# Patient Record
Sex: Male | Born: 1975
Health system: Southern US, Community
[De-identification: ages and names within clinical notes are randomized; demographics above are authoritative.]

## PROBLEM LIST (undated history)

## (undated) DIAGNOSIS — M25561 Pain in right knee: Secondary | ICD-10-CM

## (undated) DIAGNOSIS — E785 Hyperlipidemia, unspecified: Secondary | ICD-10-CM

## (undated) DIAGNOSIS — K5900 Constipation, unspecified: Secondary | ICD-10-CM

## (undated) DIAGNOSIS — K219 Gastro-esophageal reflux disease without esophagitis: Secondary | ICD-10-CM

## (undated) DIAGNOSIS — M1990 Unspecified osteoarthritis, unspecified site: Secondary | ICD-10-CM

## (undated) DIAGNOSIS — M722 Plantar fascial fibromatosis: Secondary | ICD-10-CM

## (undated) DIAGNOSIS — M5430 Sciatica, unspecified side: Secondary | ICD-10-CM

## (undated) DIAGNOSIS — M2550 Pain in unspecified joint: Secondary | ICD-10-CM

## (undated) DIAGNOSIS — G8929 Other chronic pain: Secondary | ICD-10-CM

## (undated) DIAGNOSIS — J42 Unspecified chronic bronchitis: Secondary | ICD-10-CM

## (undated) DIAGNOSIS — G039 Meningitis, unspecified: Secondary | ICD-10-CM

## (undated) DIAGNOSIS — M545 Low back pain, unspecified: Secondary | ICD-10-CM

## (undated) DIAGNOSIS — I209 Angina pectoris, unspecified: Secondary | ICD-10-CM

## (undated) DIAGNOSIS — G5600 Carpal tunnel syndrome, unspecified upper limb: Secondary | ICD-10-CM

## (undated) DIAGNOSIS — K8020 Calculus of gallbladder without cholecystitis without obstruction: Secondary | ICD-10-CM

## (undated) HISTORY — DX: Hyperlipidemia, unspecified: E78.5

## (undated) HISTORY — DX: Constipation, unspecified: K59.00

## (undated) HISTORY — DX: Calculus of gallbladder without cholecystitis without obstruction: K80.20

## (undated) HISTORY — DX: Plantar fascial fibromatosis: M72.2

## (undated) HISTORY — DX: Sciatica, unspecified side: M54.30

## (undated) HISTORY — PX: SPINE SURGERY: SHX786

## (undated) HISTORY — PX: KNEE ARTHROSCOPY: SHX127

## (undated) HISTORY — PX: LAMINECTOMY AND MICRODISCECTOMY SPINE: SHX1914

## (undated) HISTORY — DX: Pain in unspecified joint: M25.50

## (undated) HISTORY — DX: Gastro-esophageal reflux disease without esophagitis: K21.9

## (undated) HISTORY — DX: Carpal tunnel syndrome, unspecified upper limb: G56.00

## (undated) HISTORY — DX: Pain in right knee: M25.561

## (undated) HISTORY — PX: CARPAL TUNNEL RELEASE: SHX101

---

## 2001-06-11 ENCOUNTER — Emergency Department (HOSPITAL_COMMUNITY): Admission: EM | Admit: 2001-06-11 | Discharge: 2001-06-11 | Payer: Self-pay | Admitting: Emergency Medicine

## 2003-10-19 ENCOUNTER — Emergency Department (HOSPITAL_COMMUNITY): Admission: EM | Admit: 2003-10-19 | Discharge: 2003-10-19 | Payer: Self-pay | Admitting: *Deleted

## 2004-04-01 ENCOUNTER — Encounter: Admission: RE | Admit: 2004-04-01 | Discharge: 2004-04-01 | Payer: Self-pay | Admitting: Family Medicine

## 2004-05-05 ENCOUNTER — Encounter: Admission: RE | Admit: 2004-05-05 | Discharge: 2004-05-05 | Payer: Self-pay | Admitting: Gastroenterology

## 2004-05-12 ENCOUNTER — Emergency Department (HOSPITAL_COMMUNITY): Admission: EM | Admit: 2004-05-12 | Discharge: 2004-05-12 | Payer: Self-pay | Admitting: Family Medicine

## 2004-05-14 ENCOUNTER — Emergency Department (HOSPITAL_COMMUNITY): Admission: EM | Admit: 2004-05-14 | Discharge: 2004-05-14 | Payer: Self-pay | Admitting: Family Medicine

## 2009-05-31 HISTORY — PX: CHOLECYSTECTOMY: SHX55

## 2010-05-24 ENCOUNTER — Emergency Department (HOSPITAL_COMMUNITY)
Admission: EM | Admit: 2010-05-24 | Discharge: 2010-05-24 | Payer: Self-pay | Source: Home / Self Care | Admitting: Emergency Medicine

## 2010-05-31 DEATH — deceased

## 2010-06-21 ENCOUNTER — Encounter: Payer: Self-pay | Admitting: Family Medicine

## 2011-06-03 ENCOUNTER — Ambulatory Visit: Payer: Self-pay | Admitting: Rheumatology

## 2011-06-18 ENCOUNTER — Ambulatory Visit: Payer: Self-pay | Admitting: Rheumatology

## 2011-07-14 ENCOUNTER — Ambulatory Visit: Payer: Self-pay | Admitting: Anesthesiology

## 2011-07-14 LAB — CBC WITH DIFFERENTIAL/PLATELET
Basophil #: 0 10*3/uL (ref 0.0–0.1)
Basophil %: 0.4 %
Eosinophil #: 0.1 10*3/uL (ref 0.0–0.7)
Eosinophil %: 2.4 %
HCT: 41.4 % (ref 40.0–52.0)
HGB: 14.3 g/dL (ref 13.0–18.0)
Lymphocyte #: 1.8 10*3/uL (ref 1.0–3.6)
Lymphocyte %: 32.6 %
MCH: 31 pg (ref 26.0–34.0)
MCHC: 34.4 g/dL (ref 32.0–36.0)
MCV: 90 fL (ref 80–100)
Monocyte #: 0.3 10*3/uL (ref 0.0–0.7)
Monocyte %: 5.7 %
Neutrophil #: 3.2 10*3/uL (ref 1.4–6.5)
Neutrophil %: 58.9 %
Platelet: 130 10*3/uL — ABNORMAL LOW (ref 150–440)
RBC: 4.6 10*6/uL (ref 4.40–5.90)
RDW: 12.8 % (ref 11.5–14.5)
WBC: 5.4 10*3/uL (ref 3.8–10.6)

## 2011-07-16 ENCOUNTER — Ambulatory Visit: Payer: Self-pay | Admitting: Rheumatology

## 2011-07-20 ENCOUNTER — Ambulatory Visit: Payer: Self-pay | Admitting: Orthopedic Surgery

## 2011-12-16 ENCOUNTER — Other Ambulatory Visit: Payer: Self-pay | Admitting: Internal Medicine

## 2011-12-16 ENCOUNTER — Telehealth: Payer: Self-pay

## 2011-12-16 MED ORDER — VARENICLINE TARTRATE 1 MG PO TABS: 1.0000 mg | ORAL_TABLET | Freq: Two times a day (BID) | ORAL | Status: AC

## 2011-12-16 NOTE — Telephone Encounter (Signed)
Pt is requesting  A new script for chantix

## 2011-12-16 NOTE — Telephone Encounter (Signed)
Chart is at nurses station for review 

## 2011-12-16 NOTE — Telephone Encounter (Signed)
PATIENT CALLED BACK SAYING HE FORGOT TO TELL us ABOUT HIS NEW PHARMACY.  HE WILL BE USING THE [facility] IN [place] ON [address]

## 2011-12-16 NOTE — Telephone Encounter (Signed)
Refilled x 1 month but last OV in August 2012 so needs recheck for additional refills

## 2011-12-17 NOTE — Telephone Encounter (Signed)
Pt.notified

## 2012-03-09 ENCOUNTER — Telehealth: Payer: Self-pay

## 2012-03-09 NOTE — Telephone Encounter (Signed)
Advised patient as per last phone message, he is due for follow up. He is going to call back and make appt to see Dr Perrin Maltese.

## 2012-03-09 NOTE — Telephone Encounter (Signed)
The patient called to request refill of Chantix rx.  The patient would like the rx called into the [facility] on [address].  Please call the patient once the rx has been called in to the pharmacy at [phone].

## 2012-06-11 ENCOUNTER — Ambulatory Visit: Payer: 59 | Admitting: Family Medicine

## 2012-06-11 VITALS — BP 117/77 | HR 90 | Temp 98.2°F | Resp 16 | Ht 69.0 in | Wt 231.0 lb

## 2012-06-11 DIAGNOSIS — J329 Chronic sinusitis, unspecified: Secondary | ICD-10-CM

## 2012-06-11 DIAGNOSIS — F17200 Nicotine dependence, unspecified, uncomplicated: Secondary | ICD-10-CM

## 2012-06-11 DIAGNOSIS — J019 Acute sinusitis, unspecified: Secondary | ICD-10-CM

## 2012-06-11 DIAGNOSIS — Z23 Encounter for immunization: Secondary | ICD-10-CM

## 2012-06-11 DIAGNOSIS — IMO0002 Reserved for concepts with insufficient information to code with codable children: Secondary | ICD-10-CM

## 2012-06-11 DIAGNOSIS — Z202 Contact with and (suspected) exposure to infections with a predominantly sexual mode of transmission: Secondary | ICD-10-CM

## 2012-06-11 DIAGNOSIS — S61209A Unspecified open wound of unspecified finger without damage to nail, initial encounter: Secondary | ICD-10-CM

## 2012-06-11 MED ORDER — AMOXICILLIN 875 MG PO TABS: 875.0000 mg | ORAL_TABLET | Freq: Two times a day (BID) | ORAL | Status: DC

## 2012-06-11 MED ORDER — VARENICLINE TARTRATE 1 MG PO TABS: ORAL_TABLET | ORAL | Status: DC

## 2012-06-11 MED ORDER — TETANUS-DIPHTH-ACELL PERTUSSIS 5-2.5-18.5 LF-MCG/0.5 IM SUSP: 0.5000 mL | Freq: Once | INTRAMUSCULAR | Status: DC

## 2012-06-11 NOTE — Progress Notes (Signed)
Verbal consent obtained from the patient.  Local anesthesia with 4cc Lidocaine 2% without epinephrine.  Wound scrubbed with soap and water and rinsed.  Wound closed with #1 4-0 Prolene horizontal mattress sutures.  Wound cleansed and dressed.

## 2012-06-11 NOTE — Patient Instructions (Addendum)

## 2012-06-11 NOTE — Progress Notes (Signed)
37 yo man with laceration to base of left middle finger while opening an avocodo.  The volar finger bleed profusely.  He has a bit of tingling throughout the finger, but ROM seems normal.  He also wants to quit smoking.  He did quit with the Chantix in the past and would like to try it again.  Works for [employer], married  Unsure of last dT  Objective: NAD Volar middle left finger has 1/2 cm laceration, full thickness, which is gaping. ROM is full Sensation with inanimate objects seems normal  Chest:  Clear Nasal passages:  Copious green mucous TM's clear Oroph:  clear  Assessment: Simple laceration without injury to deep structures Repair per PA  URI  Desire to quit smoking  1. Laceration  TDaP (BOOSTRIX) injection 0.5 mL  2. Smoking  varenicline (CHANTIX CONTINUING MONTH PAK) 1 MG tablet  3. Sinusitis  amoxicillin (AMOXIL) 875 MG tablet

## 2013-02-27 ENCOUNTER — Observation Stay (HOSPITAL_COMMUNITY)
Admission: EM | Admit: 2013-02-27 | Discharge: 2013-02-28 | Disposition: A | Discharge status: Home / Self Care | Payer: 59 | Attending: Internal Medicine | Admitting: Internal Medicine

## 2013-02-27 ENCOUNTER — Encounter (HOSPITAL_COMMUNITY)
Admission: EM | Disposition: A | Discharge status: Home / Self Care | Payer: Self-pay | Source: Home / Self Care | Attending: Emergency Medicine

## 2013-02-27 ENCOUNTER — Emergency Department (HOSPITAL_COMMUNITY): Payer: 59

## 2013-02-27 ENCOUNTER — Encounter (HOSPITAL_COMMUNITY): Payer: Self-pay

## 2013-02-27 ENCOUNTER — Inpatient Hospital Stay (HOSPITAL_COMMUNITY): Payer: 59

## 2013-02-27 DIAGNOSIS — R51 Headache: Secondary | ICD-10-CM | POA: Insufficient documentation

## 2013-02-27 DIAGNOSIS — R1013 Epigastric pain: Secondary | ICD-10-CM | POA: Insufficient documentation

## 2013-02-27 DIAGNOSIS — D696 Thrombocytopenia, unspecified: Secondary | ICD-10-CM | POA: Insufficient documentation

## 2013-02-27 DIAGNOSIS — Z9089 Acquired absence of other organs: Secondary | ICD-10-CM | POA: Insufficient documentation

## 2013-02-27 DIAGNOSIS — K922 Gastrointestinal hemorrhage, unspecified: Principal | ICD-10-CM

## 2013-02-27 DIAGNOSIS — I959 Hypotension, unspecified: Secondary | ICD-10-CM | POA: Insufficient documentation

## 2013-02-27 DIAGNOSIS — R109 Unspecified abdominal pain: Secondary | ICD-10-CM

## 2013-02-27 DIAGNOSIS — R0602 Shortness of breath: Secondary | ICD-10-CM | POA: Insufficient documentation

## 2013-02-27 DIAGNOSIS — G4730 Sleep apnea, unspecified: Secondary | ICD-10-CM | POA: Insufficient documentation

## 2013-02-27 DIAGNOSIS — K920 Hematemesis: Secondary | ICD-10-CM

## 2013-02-27 HISTORY — PX: ESOPHAGOGASTRODUODENOSCOPY: SHX5428

## 2013-02-27 LAB — URINALYSIS, ROUTINE W REFLEX MICROSCOPIC
Bilirubin Urine: NEGATIVE
Glucose, UA: NEGATIVE mg/dL
Hgb urine dipstick: NEGATIVE
Ketones, ur: NEGATIVE mg/dL
Leukocytes, UA: NEGATIVE
Nitrite: NEGATIVE
Protein, ur: NEGATIVE mg/dL
Specific Gravity, Urine: 1.023 (ref 1.005–1.030)
Urobilinogen, UA: 0.2 mg/dL (ref 0.0–1.0)
pH: 6.5 (ref 5.0–8.0)

## 2013-02-27 LAB — CBC WITH DIFFERENTIAL/PLATELET
Basophils Absolute: 0 10*3/uL (ref 0.0–0.1)
Basophils Relative: 0 % (ref 0–1)
Eosinophils Absolute: 0.1 10*3/uL (ref 0.0–0.7)
Eosinophils Relative: 1 % (ref 0–5)
HCT: 42.5 % (ref 39.0–52.0)
Hemoglobin: 14.8 g/dL (ref 13.0–17.0)
Lymphocytes Relative: 13 % (ref 12–46)
Lymphs Abs: 1.6 10*3/uL (ref 0.7–4.0)
MCH: 29.9 pg (ref 26.0–34.0)
MCHC: 34.8 g/dL (ref 30.0–36.0)
MCV: 85.9 fL (ref 78.0–100.0)
Monocytes Absolute: 1.1 10*3/uL — ABNORMAL HIGH (ref 0.1–1.0)
Monocytes Relative: 9 % (ref 3–12)
Neutro Abs: 9.7 10*3/uL — ABNORMAL HIGH (ref 1.7–7.7)
Neutrophils Relative %: 77 % (ref 43–77)
Platelets: 128 10*3/uL — ABNORMAL LOW (ref 150–400)
RBC: 4.95 MIL/uL (ref 4.22–5.81)
RDW: 12.5 % (ref 11.5–15.5)
WBC: 12.5 10*3/uL — ABNORMAL HIGH (ref 4.0–10.5)

## 2013-02-27 LAB — COMPREHENSIVE METABOLIC PANEL
ALT: 39 U/L (ref 0–53)
AST: 24 U/L (ref 0–37)
Albumin: 4.2 g/dL (ref 3.5–5.2)
Alkaline Phosphatase: 55 U/L (ref 39–117)
BUN: 15 mg/dL (ref 6–23)
CO2: 22 mEq/L (ref 19–32)
Calcium: 9.3 mg/dL (ref 8.4–10.5)
Chloride: 103 mEq/L (ref 96–112)
Creatinine, Ser: 0.89 mg/dL (ref 0.50–1.35)
GFR calc Af Amer: >90 mL/min (ref >90)
GFR calc non Af Amer: >90 mL/min (ref >90)
Glucose, Bld: 89 mg/dL (ref 70–99)
Potassium: 4 mEq/L (ref 3.5–5.1)
Sodium: 139 mEq/L (ref 135–145)
Total Bilirubin: 0.9 mg/dL (ref 0.3–1.2)
Total Protein: 7 g/dL (ref 6.0–8.3)

## 2013-02-27 LAB — TYPE AND SCREEN
ABO/RH(D): B POS
Antibody Screen: NEGATIVE

## 2013-02-27 LAB — ABO/RH: ABO/RH(D): B POS

## 2013-02-27 LAB — LIPASE, BLOOD: Lipase: 29 U/L (ref 11–59)

## 2013-02-27 LAB — CG4 I-STAT (LACTIC ACID): Lactic Acid, Venous: 2.49 mmol/L — ABNORMAL HIGH (ref 0.5–2.2)

## 2013-02-27 SURGERY — EGD (ESOPHAGOGASTRODUODENOSCOPY)
Anesthesia: Moderate Sedation

## 2013-02-27 MED ORDER — MIDAZOLAM HCL 10 MG/2ML IJ SOLN
INTRAMUSCULAR | Status: AC
  Filled 2013-02-27: qty 2

## 2013-02-27 MED ORDER — SODIUM CHLORIDE 0.9 % IV SOLN
80.0000 mg | Freq: Once | INTRAVENOUS | Status: AC
  Administered 2013-02-27: 80 mg via INTRAVENOUS
  Filled 2013-02-27: qty 80

## 2013-02-27 MED ORDER — HYDROMORPHONE HCL PF 1 MG/ML IJ SOLN
0.5000 mg | INTRAMUSCULAR | Status: DC | PRN
  Administered 2013-02-27 – 2013-02-28 (×3): 0.5 mg via INTRAVENOUS
  Filled 2013-02-27 (×3): qty 1

## 2013-02-27 MED ORDER — ONDANSETRON HCL 4 MG/2ML IJ SOLN
4.0000 mg | Freq: Once | INTRAMUSCULAR | Status: DC
  Filled 2013-02-27: qty 2

## 2013-02-27 MED ORDER — SODIUM CHLORIDE 0.9 % IV SOLN
INTRAVENOUS | Status: DC
  Administered 2013-02-28: 03:00:00 via INTRAVENOUS

## 2013-02-27 MED ORDER — SODIUM CHLORIDE 0.9 % IV SOLN
1000.0000 mL | INTRAVENOUS | Status: DC
  Administered 2013-02-27: 1000 mL via INTRAVENOUS

## 2013-02-27 MED ORDER — IOHEXOL 300 MG/ML  SOLN
50.0000 mL | Freq: Once | INTRAMUSCULAR | Status: AC | PRN
  Administered 2013-02-27: 50 mL via ORAL

## 2013-02-27 MED ORDER — ONDANSETRON HCL 4 MG/2ML IJ SOLN
INTRAMUSCULAR | Status: AC
  Filled 2013-02-27: qty 2

## 2013-02-27 MED ORDER — FENTANYL CITRATE 0.05 MG/ML IJ SOLN
50.0000 ug | INTRAMUSCULAR | Status: AC | PRN
  Administered 2013-02-27 (×2): 50 ug via INTRAVENOUS

## 2013-02-27 MED ORDER — FENTANYL CITRATE 0.05 MG/ML IJ SOLN
INTRAMUSCULAR | Status: AC
  Filled 2013-02-27: qty 2

## 2013-02-27 MED ORDER — SODIUM CHLORIDE 0.9 % IV SOLN
1000.0000 mL | Freq: Once | INTRAVENOUS | Status: AC
  Administered 2013-02-27: 1000 mL via INTRAVENOUS

## 2013-02-27 MED ORDER — ONDANSETRON HCL 4 MG/2ML IJ SOLN
4.0000 mg | Freq: Once | INTRAMUSCULAR | Status: AC
  Administered 2013-02-27: 4 mg via INTRAVENOUS

## 2013-02-27 MED ORDER — HYDROMORPHONE HCL PF 1 MG/ML IJ SOLN
1.0000 mg | Freq: Once | INTRAMUSCULAR | Status: AC
  Administered 2013-02-27: 1 mg via INTRAVENOUS

## 2013-02-27 MED ORDER — SODIUM CHLORIDE 0.9 % IV SOLN
8.0000 mg/h | INTRAVENOUS | Status: DC
  Administered 2013-02-27: 8 mg/h via INTRAVENOUS
  Filled 2013-02-27 (×4): qty 80

## 2013-02-27 MED ORDER — HYDROMORPHONE HCL PF 1 MG/ML IJ SOLN
INTRAMUSCULAR | Status: AC
  Filled 2013-02-27: qty 1

## 2013-02-27 MED ORDER — FENTANYL CITRATE 0.05 MG/ML IJ SOLN
INTRAMUSCULAR | Status: DC | PRN
  Administered 2013-02-27 (×2): 25 ug via INTRAVENOUS

## 2013-02-27 MED ORDER — BUTAMBEN-TETRACAINE-BENZOCAINE 2-2-14 % EX AERO
INHALATION_SPRAY | CUTANEOUS | Status: DC | PRN
  Administered 2013-02-27: 2 via TOPICAL

## 2013-02-27 MED ORDER — ONDANSETRON HCL 4 MG/2ML IJ SOLN
4.0000 mg | Freq: Four times a day (QID) | INTRAMUSCULAR | Status: DC | PRN
  Administered 2013-02-27: 4 mg via INTRAVENOUS
  Filled 2013-02-27: qty 2

## 2013-02-27 MED ORDER — SODIUM CHLORIDE 0.9 % IV SOLN
INTRAVENOUS | Status: DC
  Administered 2013-02-28 (×2): via INTRAVENOUS

## 2013-02-27 MED ORDER — IOHEXOL 300 MG/ML  SOLN
100.0000 mL | Freq: Once | INTRAMUSCULAR | Status: AC | PRN
  Administered 2013-02-27: 100 mL via INTRAVENOUS

## 2013-02-27 MED ORDER — MIDAZOLAM HCL 10 MG/2ML IJ SOLN
INTRAMUSCULAR | Status: DC | PRN
  Administered 2013-02-27 (×3): 2 mg via INTRAVENOUS

## 2013-02-27 MED ORDER — DIPHENHYDRAMINE HCL 50 MG/ML IJ SOLN
INTRAMUSCULAR | Status: AC
  Filled 2013-02-27: qty 1

## 2013-02-27 NOTE — H&P (Signed)
[facility] History and Physical  Jeray Shugart ZOX:[id number] DOB: 10-07-1975 DOA: 02/27/2013  Referring physician: Dr. Juleen China PCP: No primary provider on file.  Specialists: GI, Dr. Matthias Hughs  Chief Complaint: Hematemesis   HPI: Bruce Morales is a 37 y.o. male without significant past medical history presents to the ED with epigastric abdominal pain, nausea and vomiting starting today. He woke up this morning and went to work with slight abdominal discomfort, however it progressively got worse. He had 3 episodes of emesis, initial one with coffee ground material then bright red blood. He has stabbing epigastric abdominal pain with some irradiation to the back. Denies chest pain or shortness of breath. Never had an episode like this before. Denies bright red blood in his stool or dark stools. Denies NSAID use, rarely takes Tylenol. Drinks ~2 drinks per month. He is smoking.   Review of Systems: as per HPI otherwise negative.  History reviewed. No pertinent past medical history. Past Surgical History  Procedure Laterality Date  . Cholecystectomy    . Knee surgery      right knee   Social History:  reports that he has been smoking Cigarettes.  He has been smoking about 0.00 packs per day. He has never used smokeless tobacco. He reports that he does not drink alcohol or use illicit drugs.  No Known Allergies  Family History  Problem Relation Age of Onset  . Multiple sclerosis Mother   . Heart disease Maternal Grandfather    Prior to Admission medications   Not on File   Physical Exam: Filed Vitals:   02/27/13 1330 02/27/13 1340 02/27/13 1400 02/27/13 1430  BP: 125/66 128/77 113/62 124/56  Pulse:      Temp:      TempSrc:      Resp: 21 20 23 23   Height:      Weight:      SpO2:       General:  Mild distress due to pain  Eyes: PERRL, EOMI, no scleral icterus  ENT: moist oropharynx  Neck: supple, no JVD  Cardiovascular: regular rate without MRG; 2+ peripheral  pulses  Respiratory: CTA biL, good air movement without wheezing, rhonchi or crackled  Abdomen: soft, diffusely tender to palpation  Skin: no rashes  Musculoskeletal: no peripheral edema  Psychiatric: normal mood and affect  Neurologic: CN 2-12 grossly intact, MS 5/5 in all 4  Labs on Admission:  Basic Metabolic Panel:  Recent Labs Lab 02/27/13 1315  NA 139  K 4.0  CL 103  CO2 22  GLUCOSE 89  BUN 15  CREATININE 0.89  CALCIUM 9.3   Liver Function Tests:  Recent Labs Lab 02/27/13 1315  AST 24  ALT 39  ALKPHOS 55  BILITOT 0.9  PROT 7.0  ALBUMIN 4.2    Recent Labs Lab 02/27/13 1315  LIPASE 29   CBC:  Recent Labs Lab 02/27/13 1315  WBC 12.5*  NEUTROABS 9.7*  HGB 14.8  HCT 42.5  MCV 85.9  PLT 128*   Radiological Exams on Admission: Dg Chest Portable 1 View  02/27/2013   CLINICAL DATA:  Shortness of breath, nausea/vomiting, severe abdominal pain  EXAM: PORTABLE CHEST - 1 VIEW  COMPARISON:  None.  FINDINGS: Lungs are clear. No pleural effusion or pneumothorax.  The heart is top-normal in size.  IMPRESSION: No evidence of acute cardiopulmonary disease.   Electronically Signed   By: Charline Bills M.D.   On: 02/27/2013 13:29   Assessment/Plan Active Problems:   GI bleed  GI bleed - suspect ?bleeding ulcer. GI consulted by EDP, will keep patient NPO until evaluated. PPI drip. Low blood pressure on arrival, observe in SDU up until at least he gets his EGD. Monitor CBC. DVT Prophylaxis - SCDs  Code Status: Full (presumed)  Family Communication: family bedside  Disposition Plan: inpatient  Time spent: 36  Giani Betzold M. Elvera Lennox, MD [facility] Pager [phone]  If 7PM-7AM, please contact night-coverage [email or web address] Password [email or web address] 02/27/2013, 3:10 PM

## 2013-02-27 NOTE — ED Notes (Signed)
endoscopy

## 2013-02-27 NOTE — ED Notes (Signed)
Pt transported to endoscopy 

## 2013-02-27 NOTE — ED Notes (Signed)
MD at bedside. 

## 2013-02-27 NOTE — ED Notes (Signed)
50 mcg of fentanyl given at 1426. Will wait 30 minutes from administration of fentanyl to give dilaudid 1 mg to see respiratory/BP response to 50 mg of fentanyl. If respiratory status and BP do no change, will give dilaudid 1 mg. Will continue to monitor.

## 2013-02-27 NOTE — Progress Notes (Signed)
Recd pt from endo, uncle at bedside, pt lethargic but very interactive.  C/o nausea, MD paged.  No other distress noted

## 2013-02-27 NOTE — Progress Notes (Signed)
Please see dictated endoscopy report. I have discussed the findings with Dr. Lafe Garin. We're having some difficulty explaining where his blood came from, or why he is having abdominal pain and nausea. Our plan is for a CT of the abdomen and pelvis tonight. We will repeat labs tomorrow.  Florencia Reasons, M.D. [phone]

## 2013-02-27 NOTE — ED Notes (Signed)
MD at bedside.  EDP Kohut present to evaluate this pt.

## 2013-02-27 NOTE — Interval H&P Note (Signed)
History and Physical Interval Note:  02/27/2013 4:44 PM  Bruce Morales  has presented today for surgery, with the diagnosis of hematemesis  The various methods of treatment have been discussed with the patient. After consideration of risks, benefits and other options for treatment, the patient has consented to  Procedure(s): ESOPHAGOGASTRODUODENOSCOPY (EGD) (N/A) as a surgical intervention .  The patient's history has been reviewed, patient examined, no change in status, stable for surgery.  I have reviewed the patient's chart and labs.  Questions were answered to the patient's satisfaction.     Florencia Reasons

## 2013-02-27 NOTE — Op Note (Signed)
[facility] [address] [place], [address]   ENDOSCOPY PROCEDURE REPORT  PATIENT: Bruce, Morales  MR#: [id number] BIRTHDATE: Oct 04, 1975 , 36  yrs. old GENDER: Male ENDOSCOPIST:Jammi Morrissette, MD REFERRED BY:  Unassigned PROCEDURE DATE:  02/27/2013 PROCEDURE:      upper endoscopy ASA CLASS: INDICATIONS:   coffee ground emesis followed by bright red hematemesis in a patient who awakened this morning with nausea and abdominal discomfort. No history of exposure to ulcerogenic medication. MEDICATION:    fentanyl 50 mcg, Versed 6 mg IV TOPICAL ANESTHETIC:    Cetacaine spray  DESCRIPTION OF PROCEDURE:  The patient was brought from the [place] long emergency room to the [facility] long endoscopy unit. The nature, purpose, and risks of the procedure were discussed with the patient, who had provided written consent. Time out was performed and he received the above sedation, remaining clinically stable, with perhaps mild transient desaturation related to sleep apnea prior to passage of the scope.  The Pentax adult video endoscope was passed under direct vision. The larynx looked normal.  The esophagus was readily entered, and was normal. There was no evidence of any Mallory-Weiss tear, varices, reflux esophagitis, esophageal ulcerations, neoplasia, infection, ring, or stricture. There was essentially no hiatal hernia present.  The stomach was entered. It contained a small bilious residual but no blood or coffee-ground material whatsoever. The gastric mucosa was normal. There was some mild patchy erythema in the antrum and in the cardia, but no erosive changes, telangiectasias, ulcers, polyps, or masses were seen, and a retroflex view the cardia was normal. There was no evidence of gastric varices.  The pylorus, duodenal bulb, and second duodenum looked normal.  The scope was then removed from the patient. No biopsies were obtained. He tolerated the procedure  quite well, with some transient retching at the conclusion of the procedure.      COMPLICATIONS: None  ENDOSCOPIC IMPRESSION:  1. Normal examination. 2. No source of reported hematemesis identified. 3. No source of nausea or abdominal pain endoscopically evident.  RECOMMENDATIONS:  CT of abdomen and pelvis this evening. Continue supportive care with symptomatic management.    _______________________________ eSignedBernette Redbird, MD 02/27/2013 5:37 PM    PATIENT NAME:  Bruce, Morales MR#: [id number]

## 2013-02-27 NOTE — ED Provider Notes (Signed)
CSN: [id number]     Arrival date & time 02/27/13  1227 History   First MD Initiated Contact with Patient 02/27/13 1259     Chief Complaint  Patient presents with  . Abdominal Pain  . Hematemesis   (Consider location/radiation/quality/duration/timing/severity/associated sxs/prior Treatment) HPI  37 year old male with abdominal pain and hematemesis. The patient woke up this morning and his stomach just did not feel quite right. A mild dull ache and nausea. This progressively worsened as the morning progressed. Around 11:00 had an episode of coffee-ground emesis. He had a second episode of vomiting which seemed more bilious in nature. He had a third episode of emesis which looked like nothing but bright red blood. Now severe stabbing pain in his epigastric region. Does not radiate. Denies any shortness of breath. He had a formed bowel movement this morning which did not appear bloody or melanotic. Denies use of blood thinning medication. Very rarely uses NSAIDs. No history of GI bleed. Denies significant ETOH. Past surgical history significant for cholecystectomy and subsequent complications approximately 3 years ago at outside facility. From his description it sounds like he possibly had an iatrogenic injury to his cystic duct?  History reviewed. No pertinent past medical history. Past Surgical History  Procedure Laterality Date  . Cholecystectomy    . Knee surgery      right knee   Family History  Problem Relation Age of Onset  . Multiple sclerosis Mother   . Heart disease Maternal Grandfather    History  Substance Use Topics  . Smoking status: Current Every Day Smoker    Types: Cigarettes  . Smokeless tobacco: Not on file  . Alcohol Use: Not on file    Review of Systems  All systems reviewed and negative, other than as noted in HPI.   Allergies  Review of patient's allergies indicates no known allergies.  Home Medications  No current outpatient prescriptions on file. BP  128/77  Pulse 84  Temp(Src) 99.1 F (37.3 C) (Oral)  Resp 20  Ht 5\' 9"  (1.753 m)  Wt 220 lb (99.791 kg)  BMI 32.47 kg/m2  SpO2 99% Physical Exam  Nursing note and vitals reviewed. Constitutional: He is oriented to person, place, and time. He appears distressed.  Laying in bed. Appears uncomfortable.  HENT:  Head: Normocephalic and atraumatic.  Eyes: Conjunctivae are normal. Right eye exhibits no discharge. Left eye exhibits no discharge.  Neck: Neck supple.  Cardiovascular: Normal rate, regular rhythm and normal heart sounds.  Exam reveals no gallop and no friction rub.   No murmur heard. Pulmonary/Chest: Effort normal and breath sounds normal. No respiratory distress.  Abdominal: Soft. He exhibits no distension. There is tenderness. There is guarding. There is no rebound.  Epigastric tenderness with voluntary guarding. No rebound. No distention.  Musculoskeletal: He exhibits no edema and no tenderness.  Neurological: He is alert and oriented to person, place, and time.  Skin: Skin is warm and dry.  Psychiatric: His behavior is normal. Thought content normal.    ED Course  Procedures (including critical care time)  Angiocath insertion Performed by: Raeford Razor Indication: Hypotensive. Difficult access. Wanted at least two IVs.  Consent: Verbal consent obtained. Risks and benefits: risks, benefits and alternatives were discussed Time out: Immediately prior to procedure a "time out" was called to verify the correct patient, procedure, equipment, support staff and site/side marked as required.  Preparation: Patient was prepped and draped in the usual sterile fashion.  Vein Location: L EJ  Gauge: 20g  Normal blood return and flush without difficulty Patient tolerance: Patient tolerated the procedure well with no immediate complications.  CRITICAL CARE Performed by: Raeford Razor  Total critical care time: 35 minutes  Critical care time was exclusive of separately  billable procedures and treating other patients. Critical care was necessary to treat or prevent imminent or life-threatening deterioration. Critical care was time spent personally by me on the following activities: development of treatment plan with patient and/or surrogate as well as nursing, discussions with consultants, evaluation of patient's response to treatment, examination of patient, obtaining history from patient or surrogate, ordering and performing treatments and interventions, ordering and review of laboratory studies, ordering and review of radiographic studies, pulse oximetry and re-evaluation of patient's condition.   Labs Review Labs Reviewed  CBC WITH DIFFERENTIAL - Abnormal; Notable for the following:    WBC 12.5 (*)    Platelets 128 (*)    Neutro Abs 9.7 (*)    Monocytes Absolute 1.1 (*)    All other components within normal limits  CG4 I-STAT (LACTIC ACID) - Abnormal; Notable for the following:    Lactic Acid, Venous 2.49 (*)    All other components within normal limits  COMPREHENSIVE METABOLIC PANEL  URINALYSIS, ROUTINE W REFLEX MICROSCOPIC  LIPASE, BLOOD  TYPE AND SCREEN  ABO/RH   Imaging Review Dg Chest Portable 1 View  02/27/2013   CLINICAL DATA:  Shortness of breath, nausea/vomiting, severe abdominal pain  EXAM: PORTABLE CHEST - 1 VIEW  COMPARISON:  None.  FINDINGS: Lungs are clear. No pleural effusion or pneumothorax.  The heart is top-normal in size.  IMPRESSION: No evidence of acute cardiopulmonary disease.   Electronically Signed   By: Charline Bills M.D.   On: 02/27/2013 13:29    MDM   1. Abdominal pain   2. Hematemesis     38 year old male with abdominal pain and hematemesis. W/u has been fairly unremarkable including normal H/H and BUN. He has not has any subsequent emesis in the ED. Pt did have an episode of hypotension which is concerning though and I cannot reasonably attribute it to anything else at this time aside from possible vasovagal.    Two IVs established. Pt moved to resuscitation bay. IVF bolus and pressure has responded. He is not anticoagulated. Mild thrombocytopenia, but should not put him as significant bleeding risk. Discussed with Dr. Matthias Hughs, gastroenterology. Plans EGD later this afternoon. Admission for further tx/evaluation.   Raeford Razor, MD 02/27/13 [id number]

## 2013-02-27 NOTE — ED Notes (Signed)
Pt presents with generalized stomach pain- vomiting since 9am- vomiting blood x x 3 bright red since 9am believes first blood this am was coffee dark?, middle was bile and last was bright red.  Dry heaves since.  Last episode like this was in 2010 gallbladder was removed. Denies fever and diarrhea. Denies talking NSAID, ASA admits to tylenol but not on regular basis. Denies HX of ulcers

## 2013-02-27 NOTE — Consult Note (Signed)
Referring Provider: Dr. Pamella Pert ([facility]) Primary Care Physician:  No primary provider on file. Primary Gastroenterologist:  Dr. Bernette Redbird  Reason for Consultation:  Hematemesis  HPI: Bruce Morales is a 37 y.o. male been admitted through the emergency room today. He woke up this morning with diffuse abdominal discomfort and nausea, which then progressed to an episode of coffee-ground emesis, later episode of bilious emesis, and finally an episode of bright red hematemesis. His pain has waxed and waned throughout the day, similar in severity to that of his gallbladder pain from 3 years ago when he had his gallbladder out and a subsequent bile leak, by his report.  Ulcer risk factors are absent, specifically, no prior history of ulcer disease, no history of smoking, no history of exposure to aspirin or nonsteroidal anti-inflammatory drugs. No previous history of GI bleeding.  With today's episode, he did not become syncopal, but his blood pressure did drop into the 90s in the emergency room, responding promptly to fluids.  His labs show normal BUN and hemoglobin on initial testing.  He has continued to have severe nausea and vomiting intermittently since coming to the emergency room, requiring Zofran and Dilaudid.     History reviewed. No pertinent past medical history.  Past Surgical History  Procedure Laterality Date  . Cholecystectomy    . Knee surgery      right knee    Prior to Admission medications   Not on File    Current Facility-Administered Medications  Medication Dose Route Frequency Provider Last Rate Last Dose  . 0.9 %  sodium chloride infusion  1,000 mL Intravenous Continuous Raeford Razor, MD 125 mL/hr at 02/27/13 1323 1,000 mL at 02/27/13 1323  . butamben-tetracaine-benzocaine (CETACAINE) spray    PRN Florencia Reasons, MD   2 spray at 02/27/13 1645  . HYDROmorphone (DILAUDID) injection 0.5 mg  0.5 mg Intravenous Q2H PRN Pamella Pert, MD       . ondansetron (ZOFRAN) injection 4 mg  4 mg Intravenous Once Raeford Razor, MD      . pantoprazole (PROTONIX) 80 mg in sodium chloride 0.9 % 250 mL infusion  8 mg/hr Intravenous Continuous Raeford Razor, MD 25 mL/hr at 02/27/13 1344 8 mg/hr at 02/27/13 1344    Allergies as of 02/27/2013  . (No Known Allergies)    Family History  Problem Relation Age of Onset  . Multiple sclerosis Mother   . Heart disease Maternal Grandfather     History   Social History  . Marital Status: Single    Spouse Name: N/A    Number of Children: N/A  . Years of Education: N/A   Occupational History  . Not on file.   Social History Main Topics  . Smoking status: Current Every Day Smoker -- 0.50 packs/day    Types: Cigarettes  . Smokeless tobacco: Never Used  . Alcohol Use: No  . Drug Use: No  . Sexual Activity: No   Other Topics Concern  . Not on file   Social History Narrative  . No narrative on file    Review of Systems: The patient has achieved an intentional 90 pound weight loss over the past year or so. No problem with anorexia. Has recently been bothered by some reflux symptomatology. Also, had constipation this summer, which has pretty much resolved. He thinks it is largely related to diet.  Physical Exam: Vital signs in last 24 hours: Temp:  [98.4 F (36.9 C)-99.1 F (37.3 C)] 98.4 F (36.9 C) (  09/30 1536) Pulse Rate:  [84-89] 84 (09/30 1243) Resp:  [17-24] 17 (09/30 1536) BP: (94-132)/(42-78) 131/56 mmHg (09/30 1536) SpO2:  [95 %-100 %] 95 % (09/30 1536) Weight:  [99.791 kg (220 lb)] 99.791 kg (220 lb) (09/30 1243)   General:   Alert,  Well-developed, well-nourished, pleasant and cooperative in no distress, appearing fairly comfortable overall following pain medication. The patient does not look shocky or septic. Head:  Normocephalic and atraumatic. Eyes:  Sclera clear, no icterus.   Conjunctiva pink. Mouth:   No ulcerations or lesions.  Oropharynx pink & moist. Lungs:   Clear throughout to auscultation.   No wheezes, crackles, or rhonchi. No evident respiratory distress. Heart:   Regular rate and rhythm; no murmurs, clicks, rubs,  or gallops. Abdomen:  The abdomen is nondistended and has normal, active bowel sounds, without bruits. No succussion splash. There is diffuse subjective tenderness, without guarding. No organomegaly or mass effect. No peritoneal findings, such as rigidity or rebound or involuntary guarding. Msk:   Symmetrical without gross deformities. Pulses:  Normal radial pulse noted. It is not weak and thready. Extremities:   Without clubbing, cyanosis, or edema. Neurologic:  Alert and coherent;  grossly normal neurologically. Skin: Warm and dry. Intact without significant lesions or rashes. Psych:   Alert and cooperative. Normal mood and affect.  Intake/Output from previous day:   Intake/Output this shift:    Lab Results:  Recent Labs  02/27/13 1315  WBC 12.5*  HGB 14.8  HCT 42.5  PLT 128*   BMET  Recent Labs  02/27/13 1315  NA 139  K 4.0  CL 103  CO2 22  GLUCOSE 89  BUN 15  CREATININE 0.89  CALCIUM 9.3   LFT  Recent Labs  02/27/13 1315  PROT 7.0  ALBUMIN 4.2  AST 24  ALT 39  ALKPHOS 55  BILITOT 0.9   PT/INR No results found for this basename: LABPROT, INR,  in the last 72 hours  Studies/Results: Dg Chest Portable 1 View  02/27/2013   CLINICAL DATA:  Shortness of breath, nausea/vomiting, severe abdominal pain  EXAM: PORTABLE CHEST - 1 VIEW  COMPARISON:  None.  FINDINGS: Lungs are clear. No pleural effusion or pneumothorax.  The heart is top-normal in size.  IMPRESSION: No evidence of acute cardiopulmonary disease.   Electronically Signed   By: Charline Bills M.D.   On: 02/27/2013 13:29    Impression: 1. Coffee ground emesis followed by subsequent hematemesis in a pattern suggestive of a Mallory-Weiss tear 2. No initial anemia or elevation of BUN to suggest clinically significant GI bleeding. 3.  Hypotension in the ER (mild to moderate), possibly vagal rather than being secondary to blood loss or volume contraction 4. Nausea, vomiting, and abdominal pain of unclear cause. The patient does not appear to have an acute abdomen based on examination.  Plan: Begin with endoscopic evaluation to exclude a penetrating ulcer or a lesion that would put him at risk for clinically destabilizing upper GI bleeding. Depending on those findings, further evaluation for his abdominal pain and nausea, such as a CT scan, could be considered.   LOS: 0 days   Gus Littler V  02/27/2013, 4:47 PM

## 2013-02-28 ENCOUNTER — Encounter (HOSPITAL_COMMUNITY): Payer: Self-pay | Admitting: Gastroenterology

## 2013-02-28 DIAGNOSIS — K922 Gastrointestinal hemorrhage, unspecified: Secondary | ICD-10-CM

## 2013-02-28 DIAGNOSIS — R109 Unspecified abdominal pain: Secondary | ICD-10-CM

## 2013-02-28 DIAGNOSIS — K920 Hematemesis: Secondary | ICD-10-CM

## 2013-02-28 LAB — CBC WITH DIFFERENTIAL/PLATELET
Basophils Absolute: 0 10*3/uL (ref 0.0–0.1)
Basophils Relative: 0 % (ref 0–1)
Eosinophils Absolute: 0.1 10*3/uL (ref 0.0–0.7)
Eosinophils Relative: 1 % (ref 0–5)
HCT: 34 % — ABNORMAL LOW (ref 39.0–52.0)
Hemoglobin: 11.8 g/dL — ABNORMAL LOW (ref 13.0–17.0)
Lymphocytes Relative: 30 % (ref 12–46)
Lymphs Abs: 1.9 10*3/uL (ref 0.7–4.0)
MCH: 30.1 pg (ref 26.0–34.0)
MCHC: 34.7 g/dL (ref 30.0–36.0)
MCV: 86.7 fL (ref 78.0–100.0)
Monocytes Absolute: 0.8 10*3/uL (ref 0.1–1.0)
Monocytes Relative: 12 % (ref 3–12)
Neutro Abs: 3.5 10*3/uL (ref 1.7–7.7)
Neutrophils Relative %: 57 % (ref 43–77)
Platelets: 100 10*3/uL — ABNORMAL LOW (ref 150–400)
RBC: 3.92 MIL/uL — ABNORMAL LOW (ref 4.22–5.81)
RDW: 12.7 % (ref 11.5–15.5)
WBC: 6.3 10*3/uL (ref 4.0–10.5)

## 2013-02-28 LAB — COMPREHENSIVE METABOLIC PANEL
ALT: 33 U/L (ref 0–53)
AST: 19 U/L (ref 0–37)
Albumin: 3.2 g/dL — ABNORMAL LOW (ref 3.5–5.2)
Alkaline Phosphatase: 44 U/L (ref 39–117)
BUN: 11 mg/dL (ref 6–23)
CO2: 25 mEq/L (ref 19–32)
Calcium: 7.8 mg/dL — ABNORMAL LOW (ref 8.4–10.5)
Chloride: 108 mEq/L (ref 96–112)
Creatinine, Ser: 0.97 mg/dL (ref 0.50–1.35)
GFR calc Af Amer: >90 mL/min (ref >90)
GFR calc non Af Amer: >90 mL/min (ref >90)
Glucose, Bld: 88 mg/dL (ref 70–99)
Potassium: 3.6 mEq/L (ref 3.5–5.1)
Sodium: 140 mEq/L (ref 135–145)
Total Bilirubin: 1.1 mg/dL (ref 0.3–1.2)
Total Protein: 5.3 g/dL — ABNORMAL LOW (ref 6.0–8.3)

## 2013-02-28 LAB — LIPASE, BLOOD: Lipase: 19 U/L (ref 11–59)

## 2013-02-28 MED ORDER — HYDROMORPHONE HCL PF 1 MG/ML IJ SOLN: 0.5000 mg | INTRAMUSCULAR | Status: DC | PRN

## 2013-02-28 MED ORDER — PANTOPRAZOLE SODIUM 40 MG PO TBEC: 40.0000 mg | DELAYED_RELEASE_TABLET | Freq: Every day | ORAL | Status: DC

## 2013-02-28 MED ORDER — ONDANSETRON HCL 4 MG PO TABS: 4.0000 mg | ORAL_TABLET | Freq: Three times a day (TID) | ORAL | Status: DC | PRN

## 2013-02-28 MED ORDER — HYDROCODONE-ACETAMINOPHEN 5-325 MG PO TABS: 1.0000 | ORAL_TABLET | Freq: Four times a day (QID) | ORAL | Status: DC | PRN

## 2013-02-28 MED ORDER — PANTOPRAZOLE SODIUM 40 MG PO TBEC
40.0000 mg | DELAYED_RELEASE_TABLET | Freq: Every day | ORAL | Status: DC
  Administered 2013-02-28: 40 mg via ORAL
  Filled 2013-02-28: qty 1

## 2013-02-28 MED ORDER — OXYCODONE-ACETAMINOPHEN 5-325 MG PO TABS
1.0000 | ORAL_TABLET | ORAL | Status: DC | PRN
  Administered 2013-02-28: 2 via ORAL
  Filled 2013-02-28: qty 2

## 2013-02-28 MED ORDER — PROMETHAZINE HCL 25 MG/ML IJ SOLN
12.5000 mg | Freq: Once | INTRAMUSCULAR | Status: AC
  Administered 2013-02-28: 12.5 mg via INTRAVENOUS
  Filled 2013-02-28: qty 1

## 2013-02-28 NOTE — Progress Notes (Signed)
Pt discharging home, feeling much better. Tolerated diet without problem. PIV removed from L neck without problem, pressure held x20min, and pressure dressing applied; Instructed pt to leave dressing intact until tomorrow. Dr. Izola Price states ok for pt to drive, aware of medication adm. Pt is alert and oriented, and anxious to leave.   Awaiting Dr. Izola Price to call regarding signing Hydrocodone Rx.

## 2013-02-28 NOTE — Progress Notes (Addendum)
Nutrition Brief Note  Patient identified on the Malnutrition Screening Tool (MST) Report. Pt reports unknown amount of weight loss. Per chart review, pt with no significant wt loss. Pt reports that he is feeling better, feels ready to go home, likely to d/c today per chart.  Wt Readings from Last 15 Encounters:  02/27/13 220 lb (99.791 kg)  02/27/13 220 lb (99.791 kg)  06/11/12 231 lb (104.781 kg)    Body mass index is 32.47 kg/(m^2). Patient meets criteria for Obese Class I based on current BMI.   Current diet order is NPO. Labs and medications reviewed.   No nutrition interventions warranted at this time. If nutrition issues arise, please consult RD.   Bruce Motto MS, RD, LDN Pager: [phone] After-hours pager: [phone]

## 2013-02-28 NOTE — Progress Notes (Signed)
Call to Dr. Izola Price as pt had 2-3 yellow stools. Pt states last time he had stools like this he had gallbladder problems. He states his abd pain is improved, but he has a headache.   Stool cultures ordered.

## 2013-02-28 NOTE — Discharge Summary (Signed)
Physician Discharge Summary  Bruce Morales AVW:[id number] DOB: 1975/06/02 DOA: 02/27/2013  PCP: No primary provider on file.  Admit date: 02/27/2013 Discharge date: 02/28/2013  Recommendations for Outpatient Follow-up:  1. Pt will need to follow up with PCP in 2-3 weeks post discharge 2. Please obtain BMP to evaluate electrolytes and kidney function 3. Please also check CBC to evaluate Hg and Hct levels 4. Please note that the hepatitis panel pending on discharge and will need to be followed up 5. The patient was advised to stay additional day in the hospital but since he has tolerated diet well he insisted on leaving today  Discharge Diagnoses: Acute blood loss anemia with hemoptysis Active Problems:   GI bleed  Discharge Condition: Stable  Diet recommendation: Heart healthy diet discussed in details   History of present illness:   37 y.o. male without significant past medical history presented to the ED with epigastric abdominal pain, nausea and 3 episodes of coffee ground vomiting that occurred several hours prior to admission. He has denied bright red blood in his stool or dark stools. Denies NSAID use, rarely takes Tylenol. Drinks ~2 drinks per month.   Active Problems:  GI bleed  - unclear source, EGD negative  - pt is clinically stable  - Diet has been advanced and patient tolerated diet well, he insisted on leaving today - continue Protonix for now  Thrombocytopenia  - unclear etiology  - pt denies alcohol use  - no active signs of bleeding  - Hepatitis panel pending on discharge and will need to be followed up  Consultants:  GI  Procedures/Studies:  EGD 09/30 --> normal examination with no clear sourse of hematemesis  Ct Abdomen Pelvis W Contrast 02/27/2013  No acute abdominal/ pelvic findings, mass lesions or adenopathy. Status post cholecystectomy with mild intra and extrahepatic biliary dilatation.  Dg Chest Portable 1 View 02/27/2013  No evidence of acute  cardiopulmonary disease.  Antibiotics:  None  Code Status: Full  Family Communication: Pt and wife at bedside   Discharge Exam: Filed Vitals:   02/28/13 1424  BP: 137/76  Pulse: 67  Temp: 98.6 F (37 C)  Resp: 16   Filed Vitals:   02/27/13 1843 02/27/13 2139 02/28/13 0418 02/28/13 1424  BP: 131/79 113/71 123/73 137/76  Pulse: 55 77 71 67  Temp: 99.4 F (37.4 C) 100 F (37.8 C) 98.1 F (36.7 C) 98.6 F (37 C)  TempSrc: Oral Oral Oral Oral  Resp: 16 18 18 16   Height:      Weight:      SpO2: 95% 98% 97% 98%    General: Pt is alert, follows commands appropriately, not in acute distress Cardiovascular: Regular rate and rhythm, S1/S2 +, no murmurs, no rubs, no gallops Respiratory: Clear to auscultation bilaterally, no wheezing, no crackles, no rhonchi Abdominal: Soft, non tender, non distended, bowel sounds +, no guarding Extremities: no edema, no cyanosis, pulses palpable bilaterally DP and PT Neuro: Grossly nonfocal  Discharge Instructions  Discharge Orders   Future Orders Complete By Expires   Diet - low sodium heart healthy  As directed    Increase activity slowly  As directed        Medication List         HYDROcodone-acetaminophen 5-325 MG per tablet  Commonly known as:  NORCO/VICODIN  Take 1 tablet by mouth every 6 (six) hours as needed for pain.     ondansetron 4 MG tablet  Commonly known as:  ZOFRAN  Take 1  tablet (4 mg total) by mouth every 8 (eight) hours as needed for nausea.     pantoprazole 40 MG tablet  Commonly known as:  PROTONIX  Take 1 tablet (40 mg total) by mouth daily.           Follow-up Information   Follow up with Debbora Presto, MD In 2 weeks. (call my cell with any questions [phone])    Specialty:  Internal Medicine   Contact information:   [address] [place] [address] [phone]        The results of significant diagnostics from this hospitalization (including imaging, microbiology, ancillary  and laboratory) are listed below for reference.     Microbiology: No results found for this or any previous visit (from the past 240 hour(s)).   Labs: Basic Metabolic Panel:  Recent Labs Lab 02/27/13 1315 02/28/13 0505  NA 139 140  K 4.0 3.6  CL 103 108  CO2 22 25  GLUCOSE 89 88  BUN 15 11  CREATININE 0.89 0.97  CALCIUM 9.3 7.8*   Liver Function Tests:  Recent Labs Lab 02/27/13 1315 02/28/13 0505  AST 24 19  ALT 39 33  ALKPHOS 55 44  BILITOT 0.9 1.1  PROT 7.0 5.3*  ALBUMIN 4.2 3.2*    Recent Labs Lab 02/27/13 1315 02/28/13 0505  LIPASE 29 19   CBC:  Recent Labs Lab 02/27/13 1315 02/28/13 0505  WBC 12.5* 6.3  NEUTROABS 9.7* 3.5  HGB 14.8 11.8*  HCT 42.5 34.0*  MCV 85.9 86.7  PLT 128* 100*     SIGNED: Time coordinating discharge: Over 30 minutes  Debbora Presto, MD  [facility] 02/28/2013, 6:04 PM Pager [phone]  If 7PM-7AM, please contact night-coverage [email or web address] Password [email or web address]

## 2013-02-28 NOTE — Progress Notes (Signed)
Patient had 2.17 and 2.30 seconds pauses between 6-6:30 am. Patient has been sleeping, asymptomatic. Vital signs stable. Will continue to monitor patient.

## 2013-02-28 NOTE — Progress Notes (Signed)
[facility] Progress Note  Subjective: The patient is feeling somewhat better today and actually wants to go home. He is somewhat lethargic do to a recent dose of Phenergan. We reviewed his EGD which was negative with no explanation for his vomiting or hematemesis. We reviewed his CT scan which did not reveal any abnormality. He has not eaten anything yet.   Objective: Vital signs in last 24 hours: Temp:  [98.1 F (36.7 C)-100 F (37.8 C)] 98.1 F (36.7 C) (10/01 0418) Pulse Rate:  [55-108] 71 (10/01 0418) Resp:  [10-30] 18 (10/01 0418) BP: (94-136)/(42-80) 123/73 mmHg (10/01 0418) SpO2:  [95 %-100 %] 97 % (10/01 0418) Weight:  [99.791 kg (220 lb)] 99.791 kg (220 lb) (09/30 1243) Weight change:    PE:  He is somewhat lethargic  Heart regular rhythm  Abdomen: Soft and nontender  Lab Results: Results for orders placed during the hospital encounter of 02/27/13 (from the past 24 hour(s))  ABO/RH     Status: None   Collection Time    02/27/13  1:14 PM      Result Value Range   ABO/RH(D) B POS    CBC WITH DIFFERENTIAL     Status: Abnormal   Collection Time    02/27/13  1:15 PM      Result Value Range   WBC 12.5 (*) 4.0 - 10.5 K/uL   RBC 4.95  4.22 - 5.81 MIL/uL   Hemoglobin 14.8  13.0 - 17.0 g/dL   HCT 16.1  09.6 - 04.5 %   MCV 85.9  78.0 - 100.0 fL   MCH 29.9  26.0 - 34.0 pg   MCHC 34.8  30.0 - 36.0 g/dL   RDW 40.9  81.1 - 91.4 %   Platelets 128 (*) 150 - 400 K/uL   Neutrophils Relative % 77  43 - 77 %   Neutro Abs 9.7 (*) 1.7 - 7.7 K/uL   Lymphocytes Relative 13  12 - 46 %   Lymphs Abs 1.6  0.7 - 4.0 K/uL   Monocytes Relative 9  3 - 12 %   Monocytes Absolute 1.1 (*) 0.1 - 1.0 K/uL   Eosinophils Relative 1  0 - 5 %   Eosinophils Absolute 0.1  0.0 - 0.7 K/uL   Basophils Relative 0  0 - 1 %   Basophils Absolute 0.0  0.0 - 0.1 K/uL  COMPREHENSIVE METABOLIC PANEL     Status: None   Collection Time    02/27/13  1:15 PM      Result Value Range   Sodium  139  135 - 145 mEq/L   Potassium 4.0  3.5 - 5.1 mEq/L   Chloride 103  96 - 112 mEq/L   CO2 22  19 - 32 mEq/L   Glucose, Bld 89  70 - 99 mg/dL   BUN 15  6 - 23 mg/dL   Creatinine, Ser 7.82  0.50 - 1.35 mg/dL   Calcium 9.3  8.4 - 95.6 mg/dL   Total Protein 7.0  6.0 - 8.3 g/dL   Albumin 4.2  3.5 - 5.2 g/dL   AST 24  0 - 37 U/L   ALT 39  0 - 53 U/L   Alkaline Phosphatase 55  39 - 117 U/L   Total Bilirubin 0.9  0.3 - 1.2 mg/dL   GFR calc non Af Amer >90  >90 mL/min   GFR calc Af Amer >90  >90 mL/min  TYPE AND SCREEN     Status:  None   Collection Time    02/27/13  1:15 PM      Result Value Range   ABO/RH(D) B POS     Antibody Screen NEG     Sample Expiration 03/02/2013    LIPASE, BLOOD     Status: None   Collection Time    02/27/13  1:15 PM      Result Value Range   Lipase 29  11 - 59 U/L  CG4 I-STAT (LACTIC ACID)     Status: Abnormal   Collection Time    02/27/13  1:26 PM      Result Value Range   Lactic Acid, Venous 2.49 (*) 0.5 - 2.2 mmol/L  URINALYSIS, ROUTINE W REFLEX MICROSCOPIC     Status: None   Collection Time    02/27/13  2:08 PM      Result Value Range   Color, Urine YELLOW  YELLOW   APPearance CLEAR  CLEAR   Specific Gravity, Urine 1.023  1.005 - 1.030   pH 6.5  5.0 - 8.0   Glucose, UA NEGATIVE  NEGATIVE mg/dL   Hgb urine dipstick NEGATIVE  NEGATIVE   Bilirubin Urine NEGATIVE  NEGATIVE   Ketones, ur NEGATIVE  NEGATIVE mg/dL   Protein, ur NEGATIVE  NEGATIVE mg/dL   Urobilinogen, UA 0.2  0.0 - 1.0 mg/dL   Nitrite NEGATIVE  NEGATIVE   Leukocytes, UA NEGATIVE  NEGATIVE  CBC WITH DIFFERENTIAL     Status: Abnormal   Collection Time    02/28/13  5:05 AM      Result Value Range   WBC 6.3  4.0 - 10.5 K/uL   RBC 3.92 (*) 4.22 - 5.81 MIL/uL   Hemoglobin 11.8 (*) 13.0 - 17.0 g/dL   HCT 86.5 (*) 78.4 - 69.6 %   MCV 86.7  78.0 - 100.0 fL   MCH 30.1  26.0 - 34.0 pg   MCHC 34.7  30.0 - 36.0 g/dL   RDW 29.5  28.4 - 13.2 %   Platelets 100 (*) 150 - 400 K/uL    Neutrophils Relative % 57  43 - 77 %   Lymphocytes Relative 30  12 - 46 %   Monocytes Relative 12  3 - 12 %   Eosinophils Relative 1  0 - 5 %   Basophils Relative 0  0 - 1 %   Neutro Abs 3.5  1.7 - 7.7 K/uL   Lymphs Abs 1.9  0.7 - 4.0 K/uL   Monocytes Absolute 0.8  0.1 - 1.0 K/uL   Eosinophils Absolute 0.1  0.0 - 0.7 K/uL   Basophils Absolute 0.0  0.0 - 0.1 K/uL   Smear Review MORPHOLOGY UNREMARKABLE    COMPREHENSIVE METABOLIC PANEL     Status: Abnormal   Collection Time    02/28/13  5:05 AM      Result Value Range   Sodium 140  135 - 145 mEq/L   Potassium 3.6  3.5 - 5.1 mEq/L   Chloride 108  96 - 112 mEq/L   CO2 25  19 - 32 mEq/L   Glucose, Bld 88  70 - 99 mg/dL   BUN 11  6 - 23 mg/dL   Creatinine, Ser 4.40  0.50 - 1.35 mg/dL   Calcium 7.8 (*) 8.4 - 10.5 mg/dL   Total Protein 5.3 (*) 6.0 - 8.3 g/dL   Albumin 3.2 (*) 3.5 - 5.2 g/dL   AST 19  0 - 37 U/L   ALT 33  0 -  53 U/L   Alkaline Phosphatase 44  39 - 117 U/L   Total Bilirubin 1.1  0.3 - 1.2 mg/dL   GFR calc non Af Amer >90  >90 mL/min   GFR calc Af Amer >90  >90 mL/min  LIPASE, BLOOD     Status: None   Collection Time    02/28/13  5:05 AM      Result Value Range   Lipase 19  11 - 59 U/L    Studies/Results: @RISRSLT24 @    Assessment: Abdominal pain, etiology unclear EGD and CT scan of the abdomen negative  Hematemesis, etiology unclear. The EGD negative  Plan: Advance diet and see how he does clinically. He may go home later today per Dr. Izola Price. She will followup in her outpatient clinic. It is noted that he is thrombocytopenic the etiology of this is unclear. This will be followed up on as an outpatient.    Graylin Shiver 02/28/2013, 9:32 AM  Lab Results  Component Value Date   HGB 11.8* 02/28/2013   HGB 14.8 02/27/2013   HCT 34.0* 02/28/2013   HCT 42.5 02/27/2013   ALKPHOS 44 02/28/2013   ALKPHOS 55 02/27/2013   AST 19 02/28/2013   AST 24 02/27/2013   ALT 33 02/28/2013   ALT 39 02/27/2013

## 2013-02-28 NOTE — Progress Notes (Signed)
Patient ID: Bruce Morales, male   DOB: Feb 11, 1976, 38 y.o.   MRN: [id number]  [facility] PROGRESS NOTE  Tyreece Gelles WUJ:[id number] DOB: 10/14/1975 DOA: 02/27/2013 PCP: No primary provider on file.  Brief narrative: 37 y.o. male without significant past medical history presented to the ED with epigastric abdominal pain, nausea and 3 episodes of coffee ground vomiting that occurred several hours prior to admission. He has denied bright red blood in his stool or dark stools. Denies NSAID use, rarely takes Tylenol. Drinks ~2 drinks per month.   Active Problems:   GI bleed - unclear source, EGD negative - pt is clinically stable - will plan on advancing diet and discontinuing IVF, continue antiemetics PO as needed - continue Protonix for now   Thrombocytopenia - unclear etiology - pt denies alcohol use - no active signs of bleeding - check Hep panel, repeat CMET in AM, HIV - CBC in AM  Consultants:  GI   Procedures/Studies: EGD 09/30 --> normal examination with no clear sourse of hematemesis   Ct Abdomen Pelvis W Contrast  02/27/2013    No acute abdominal/ pelvic findings, mass lesions or adenopathy.  Status post cholecystectomy with mild intra and extrahepatic biliary dilatation.    Dg Chest Portable 1 View  02/27/2013  No evidence of acute cardiopulmonary disease.     Antibiotics:  None  Code Status: Full Family Communication: Pt and wife at bedside Disposition Plan: Home when medically stable  HPI/Subjective: No events overnight.   Objective: Filed Vitals:   02/27/13 1843 02/27/13 2139 02/28/13 0418 02/28/13 1424  BP: 131/79 113/71 123/73 137/76  Pulse: 55 77 71 67  Temp: 99.4 F (37.4 C) 100 F (37.8 C) 98.1 F (36.7 C) 98.6 F (37 C)  TempSrc: Oral Oral Oral Oral  Resp: 16 18 18 16   Height:      Weight:      SpO2: 95% 98% 97% 98%    Intake/Output Summary (Last 24 hours) at 02/28/13 1426 Last data filed at 02/28/13 0900  Gross per 24 hour   Intake 988.83 ml  Output      1 ml  Net 987.83 ml    Exam:   General:  Pt is somnolent, follows commands appropriately, not in acute distress  Cardiovascular: Regular rate and rhythm, S1/S2, no murmurs, no rubs, no gallops  Respiratory: Clear to auscultation bilaterally, no wheezing, no crackles, no rhonchi  Abdomen: Soft, non tender, non distended, bowel sounds present, no guarding  Extremities: No edema, pulses DP and PT palpable bilaterally  Neuro: Grossly nonfocal  Data Reviewed: Basic Metabolic Panel:  Recent Labs Lab 02/27/13 1315 02/28/13 0505  NA 139 140  K 4.0 3.6  CL 103 108  CO2 22 25  GLUCOSE 89 88  BUN 15 11  CREATININE 0.89 0.97  CALCIUM 9.3 7.8*   Liver Function Tests:  Recent Labs Lab 02/27/13 1315 02/28/13 0505  AST 24 19  ALT 39 33  ALKPHOS 55 44  BILITOT 0.9 1.1  PROT 7.0 5.3*  ALBUMIN 4.2 3.2*    Recent Labs Lab 02/27/13 1315 02/28/13 0505  LIPASE 29 19   CBC:  Recent Labs Lab 02/27/13 1315 02/28/13 0505  WBC 12.5* 6.3  NEUTROABS 9.7* 3.5  HGB 14.8 11.8*  HCT 42.5 34.0*  MCV 85.9 86.7  PLT 128* 100*   Scheduled Meds: . ondansetron (ZOFRAN) IV  4 mg Intravenous Once   Continuous Infusions: . sodium chloride 1,000 mL (02/27/13 1323)  . sodium chloride 20 mL/hr  at 02/28/13 0241  . sodium chloride 150 mL/hr at 02/28/13 0719  . pantoprozole (PROTONIX) infusion 8 mg/hr (02/27/13 1344)    Debbora Presto, MD  [facility] Pager [phone]  If 7PM-7AM, please contact night-coverage [email or web address] Password [email or web address] 02/28/2013, 2:26 PM   LOS: 1 day

## 2013-03-01 ENCOUNTER — Telehealth: Payer: Self-pay

## 2013-03-01 LAB — OVA AND PARASITE EXAMINATION

## 2013-03-01 LAB — HEPATITIS PANEL, ACUTE
HCV Ab: NEGATIVE
Hep A IgM: NEGATIVE
Hep B C IgM: NEGATIVE
Hepatitis B Surface Ag: NEGATIVE

## 2013-03-01 LAB — HIV ANTIBODY (ROUTINE TESTING W REFLEX): HIV: NONREACTIVE

## 2013-03-01 NOTE — Telephone Encounter (Signed)
Patient called needs call back to discuss meds he was given at hospital stay recently. [phone]

## 2013-03-02 MED ORDER — HYDROMORPHONE HCL 4 MG PO TABS: 4.0000 mg | ORAL_TABLET | ORAL | Status: DC | PRN

## 2013-03-02 MED ORDER — PROMETHAZINE HCL 12.5 MG PO TABS
25.0000 mg | ORAL_TABLET | Freq: Four times a day (QID) | ORAL | Status: DC | PRN
Start: 2013-03-02 — End: 2014-04-13

## 2013-03-02 NOTE — Telephone Encounter (Signed)
Called him left message for him to call back, he has only been treated here once for acute problem, not sure how much I can help. His medications are  HYDROmorphone HCl (Tab) DILAUDID 4 MG Take 1 tablet (4 mg total) by mouth every 4 (four) hours as needed for pain. Ondansetron HCl (Tab) ZOFRAN 4 MG Take 1 tablet (4 mg total) by mouth every 8 (eight) hours as needed for nausea. Pantoprazole Sodium (Tablet Delayed Response) PROTONIX 40 MG Take 1 tablet (40 mg total) by mouth daily. Promethazine HCl (Tab) PHENERGAN 12.5 MG Take 2 tablets (25 mg total) by mouth every 6 (six) hours as needed for nausea.

## 2013-03-04 LAB — STOOL CULTURE

## 2013-03-09 ENCOUNTER — Ambulatory Visit (INDEPENDENT_AMBULATORY_CARE_PROVIDER_SITE_OTHER): Payer: 59 | Admitting: Internal Medicine

## 2013-03-09 VITALS — BP 130/80 | HR 90 | Temp 99.1°F | Resp 18 | Ht 68.5 in | Wt 211.8 lb

## 2013-03-09 DIAGNOSIS — M545 Low back pain, unspecified: Secondary | ICD-10-CM

## 2013-03-09 DIAGNOSIS — R5381 Other malaise: Secondary | ICD-10-CM

## 2013-03-09 DIAGNOSIS — R61 Generalized hyperhidrosis: Secondary | ICD-10-CM

## 2013-03-09 DIAGNOSIS — R8281 Pyuria: Secondary | ICD-10-CM

## 2013-03-09 DIAGNOSIS — R109 Unspecified abdominal pain: Secondary | ICD-10-CM

## 2013-03-09 DIAGNOSIS — R5383 Other fatigue: Secondary | ICD-10-CM

## 2013-03-09 DIAGNOSIS — R82998 Other abnormal findings in urine: Secondary | ICD-10-CM

## 2013-03-09 DIAGNOSIS — D649 Anemia, unspecified: Secondary | ICD-10-CM

## 2013-03-09 LAB — POCT URINALYSIS DIPSTICK
Bilirubin, UA: NEGATIVE
Blood, UA: NEGATIVE
Glucose, UA: NEGATIVE
Ketones, UA: NEGATIVE
Leukocytes, UA: NEGATIVE
Nitrite, UA: NEGATIVE
Protein, UA: NEGATIVE
Spec Grav, UA: >=1.03
Urobilinogen, UA: 0.2
pH, UA: 5

## 2013-03-09 LAB — POCT CBC
Granulocyte percent: 65.3 %G (ref 37–80)
HCT, POC: 47.5 % (ref 43.5–53.7)
Hemoglobin: 15.5 g/dL (ref 14.1–18.1)
Lymph, poc: 2.1 (ref 0.6–3.4)
MCH, POC: 30.7 pg (ref 27–31.2)
MCHC: 32.6 g/dL (ref 31.8–35.4)
MCV: 94.1 fL (ref 80–97)
MID (cbc): 0.4 (ref 0–0.9)
MPV: 11 fL (ref 0–99.8)
POC Granulocyte: 4.7 (ref 2–6.9)
POC LYMPH PERCENT: 29.3 %L (ref 10–50)
POC MID %: 5.4 %M (ref 0–12)
Platelet Count, POC: 178 10*3/uL (ref 142–424)
RBC: 5.05 M/uL (ref 4.69–6.13)
RDW, POC: 13.5 %
WBC: 7.2 10*3/uL (ref 4.6–10.2)

## 2013-03-09 LAB — COMPREHENSIVE METABOLIC PANEL
ALT: 48 U/L (ref 0–53)
AST: 20 U/L (ref 0–37)
Albumin: 4.6 g/dL (ref 3.5–5.2)
Alkaline Phosphatase: 63 U/L (ref 39–117)
BUN: 14 mg/dL (ref 6–23)
CO2: 26 mEq/L (ref 19–32)
Calcium: 9.7 mg/dL (ref 8.4–10.5)
Chloride: 107 mEq/L (ref 96–112)
Creat: 0.89 mg/dL (ref 0.50–1.35)
Glucose, Bld: 104 mg/dL — ABNORMAL HIGH (ref 70–99)
Potassium: 4.5 mEq/L (ref 3.5–5.3)
Sodium: 139 mEq/L (ref 135–145)
Total Bilirubin: 0.6 mg/dL (ref 0.3–1.2)
Total Protein: 7.1 g/dL (ref 6.0–8.3)

## 2013-03-09 LAB — POCT UA - MICROSCOPIC ONLY
Bacteria, U Microscopic: 2
Casts, Ur, LPF, POC: NEGATIVE
Crystals, Ur, HPF, POC: NEGATIVE
Yeast, UA: NEGATIVE

## 2013-03-09 LAB — IFOBT (OCCULT BLOOD)
IFOBT: NEGATIVE
IFOBT: NEGATIVE

## 2013-03-09 MED ORDER — CIPROFLOXACIN HCL 500 MG PO TABS: 500.0000 mg | ORAL_TABLET | Freq: Two times a day (BID) | ORAL | Status: DC

## 2013-03-09 NOTE — Progress Notes (Signed)
  Subjective:    Patient ID: Bruce Morales, male    DOB: 1976/01/17, 37 y.o.   MRN: [id number]  HPI Hx of GI bleed, hematemesis with EGD/CT abdomen work up in ER ovwer last 2 weeks. Hgb dropped, had no tx. Stool cultures were negative. Metabolic panel was abnormal No diarrhea, has been constipated, quesy stomach but eating, may be dehydrated.  Review of Systems Usually very healthy    Objective:   Physical Exam  Vitals reviewed. Constitutional: He is oriented to person, place, and time. He appears well-developed and well-nourished. No distress.  HENT:  Head: Normocephalic.  Mouth/Throat: Oropharynx is clear and moist.  Eyes: EOM are normal. No scleral icterus.  Cardiovascular: Normal rate and normal heart sounds.   Pulmonary/Chest: Effort normal and breath sounds normal.  Abdominal: He exhibits no distension and no mass. There is no tenderness. There is no rebound and no guarding.  Neurological: He is alert and oriented to person, place, and time. He exhibits normal muscle tone. Coordination normal.  Skin: No rash noted.  Psychiatric: He has a normal mood and affect.   Refuses rectal exam, will take home hemosure  Results for orders placed in visit on 03/09/13  POCT UA - MICROSCOPIC ONLY      Result Value Range   WBC, Ur, HPF, POC 1-3     RBC, urine, microscopic 4-6     Bacteria, U Microscopic 2 plus'     Mucus, UA positv     Epithelial cells, urine per micros 0-1     Crystals, Ur, HPF, POC neg     Casts, Ur, LPF, POC neg     Yeast, UA neg    POCT URINALYSIS DIPSTICK      Result Value Range   Color, UA yellow     Clarity, UA clear     Glucose, UA neg     Bilirubin, UA nege     Ketones, UA neg     Spec Grav, UA >=1.030     Blood, UA neg     pH, UA 5.0     Protein, UA neg     Urobilinogen, UA 0.2     Nitrite, UA neg     Leukocytes, UA Negative    POCT CBC      Result Value Range   WBC 7.2  4.6 - 10.2 K/uL   Lymph, poc 2.1  0.6 - 3.4   POC LYMPH PERCENT 29.3   10 - 50 %L   MID (cbc) 0.4  0 - 0.9   POC MID % 5.4  0 - 12 %M   POC Granulocyte 4.7  2 - 6.9   Granulocyte percent 65.3  37 - 80 %G   RBC 5.05  4.69 - 6.13 M/uL   Hemoglobin 15.5  14.1 - 18.1 g/dL   HCT, POC 40.9  81.1 - 53.7 %   MCV 94.1  80 - 97 fL   MCH, POC 30.7  27 - 31.2 pg   MCHC 32.6  31.8 - 35.4 g/dL   RDW, POC 91.4     Platelet Count, POC 178  142 - 424 K/uL   MPV 11.0  0 - 99.8 fL  IFOBT (OCCULT BLOOD)      Result Value Range   IFOBT Negative          Assessment & Plan:  Weakness/Back and Abdominal pain Anemia/GI bleed Pyuria/Cipro 500mg  Follow up 1 week

## 2013-03-09 NOTE — Patient Instructions (Signed)
Dehydration, Adult °Dehydration is when you lose more fluids from the body than you take in. Vital organs like the kidneys, brain, and heart cannot function without a proper amount of fluids and salt. Any loss of fluids from the body can cause dehydration.  °CAUSES  °· Vomiting. °· Diarrhea. °· Excessive sweating. °· Excessive urine output. °· Fever. °SYMPTOMS  °Mild dehydration °· Thirst. °· Dry lips. °· Slightly dry mouth. °Moderate dehydration °· Very dry mouth. °· Sunken eyes. °· Skin does not bounce back quickly when lightly pinched and released. °· Dark urine and decreased urine production. °· Decreased tear production. °· Headache. °Severe dehydration °· Very dry mouth. °· Extreme thirst. °· Rapid, weak pulse (more than 100 beats per minute at rest). °· Cold hands and feet. °· Not able to sweat in spite of heat and temperature. °· Rapid breathing. °· Blue lips. °· Confusion and lethargy. °· Difficulty being awakened. °· Minimal urine production. °· No tears. °DIAGNOSIS  °Your caregiver will diagnose dehydration based on your symptoms and your exam. Blood and urine tests will help confirm the diagnosis. The diagnostic evaluation should also identify the cause of dehydration. °TREATMENT  °Treatment of mild or moderate dehydration can often be done at home by increasing the amount of fluids that you drink. It is best to drink small amounts of fluid more often. Drinking too much at one time can make vomiting worse. Refer to the home care instructions below. °Severe dehydration needs to be treated at the hospital where you will probably be given intravenous (IV) fluids that contain water and electrolytes. °HOME CARE INSTRUCTIONS  °· Ask your caregiver about specific rehydration instructions. °· Drink enough fluids to keep your urine clear or pale yellow. °· Drink small amounts frequently if you have nausea and vomiting. °· Eat as you normally do. °· Avoid: °· Foods or drinks high in sugar. °· Carbonated  drinks. °· Juice. °· Extremely hot or cold fluids. °· Drinks with caffeine. °· Fatty, greasy foods. °· Alcohol. °· Tobacco. °· Overeating. °· Gelatin desserts. °· Wash your hands well to avoid spreading bacteria and viruses. °· Only take over-the-counter or prescription medicines for pain, discomfort, or fever as directed by your caregiver. °· Ask your caregiver if you should continue all prescribed and over-the-counter medicines. °· Keep all follow-up appointments with your caregiver. °SEEK MEDICAL CARE IF: °· You have abdominal pain and it increases or stays in one area (localizes). °· You have a rash, stiff neck, or severe headache. °· You are irritable, sleepy, or difficult to awaken. °· You are weak, dizzy, or extremely thirsty. °SEEK IMMEDIATE MEDICAL CARE IF:  °· You are unable to keep fluids down or you get worse despite treatment. °· You have frequent episodes of vomiting or diarrhea. °· You have blood or green matter (bile) in your vomit. °· You have blood in your stool or your stool looks black and tarry. °· You have not urinated in 6 to 8 hours, or you have only urinated a small amount of very dark urine. °· You have a fever. °· You faint. °MAKE SURE YOU:  °· Understand these instructions. °· Will watch your condition. °· Will get help right away if you are not doing well or get worse. °Document Released: 05/17/2005 Document Revised: 08/09/2011 Document Reviewed: 01/04/2011 °ExitCare® Patient Information ©2014 [employer]. °Urinary Tract Infection °Urinary tract infections (UTIs) can develop anywhere along your urinary tract. Your urinary tract is your body's drainage system for removing wastes and extra water.   Your urinary tract includes two kidneys, two ureters, a bladder, and a urethra. Your kidneys are a pair of bean-shaped organs. Each kidney is about the size of your fist. They are located below your ribs, one on each side of your spine. °CAUSES °Infections are caused by microbes, which are  microscopic organisms, including fungi, viruses, and bacteria. These organisms are so small that they can only be seen through a microscope. Bacteria are the microbes that most commonly cause UTIs. °SYMPTOMS  °Symptoms of UTIs may vary by age and gender of the patient and by the location of the infection. Symptoms in young women typically include a frequent and intense urge to urinate and a painful, burning feeling in the bladder or urethra during urination. Older women and men are more likely to be tired, shaky, and weak and have muscle aches and abdominal pain. A fever may mean the infection is in your kidneys. Other symptoms of a kidney infection include pain in your back or sides below the ribs, nausea, and vomiting. °DIAGNOSIS °To diagnose a UTI, your caregiver will ask you about your symptoms. Your caregiver also will ask to provide a urine sample. The urine sample will be tested for bacteria and white blood cells. White blood cells are made by your body to help fight infection. °TREATMENT  °Typically, UTIs can be treated with medication. Because most UTIs are caused by a bacterial infection, they usually can be treated with the use of antibiotics. The choice of antibiotic and length of treatment depend on your symptoms and the type of bacteria causing your infection. °HOME CARE INSTRUCTIONS °· If you were prescribed antibiotics, take them exactly as your caregiver instructs you. Finish the medication even if you feel better after you have only taken some of the medication. °· Drink enough water and fluids to keep your urine clear or pale yellow. °· Avoid caffeine, tea, and carbonated beverages. They tend to irritate your bladder. °· Empty your bladder often. Avoid holding urine for long periods of time. °· Empty your bladder before and after sexual intercourse. °· After a bowel movement, women should cleanse from front to back. Use each tissue only once. °SEEK MEDICAL CARE IF:  °· You have back pain. °· You  develop a fever. °· Your symptoms do not begin to resolve within 3 days. °SEEK IMMEDIATE MEDICAL CARE IF:  °· You have severe back pain or lower abdominal pain. °· You develop chills. °· You have nausea or vomiting. °· You have continued burning or discomfort with urination. °MAKE SURE YOU:  °· Understand these instructions. °· Will watch your condition. °· Will get help right away if you are not doing well or get worse. °Document Released: 02/24/2005 Document Revised: 11/16/2011 Document Reviewed: 06/25/2011 °ExitCare® Patient Information ©2014 [employer]. ° °

## 2013-03-09 NOTE — Progress Notes (Signed)
  Subjective:    Patient ID: Bruce Morales, male    DOB: 20-Aug-1975, 37 y.o.   MRN: [id number]  HPI Pt c/o of body aches, runny nose, lack of energy, lower back pain, dark urine. Denies fever, or chills. rx zofran and protonix not taking now.  Review of Systems     Objective:   Physical Exam        Assessment & Plan:

## 2013-03-10 ENCOUNTER — Telehealth: Payer: Self-pay | Admitting: Family Medicine

## 2013-03-10 ENCOUNTER — Emergency Department (HOSPITAL_COMMUNITY): Payer: 59

## 2013-03-10 ENCOUNTER — Emergency Department (HOSPITAL_COMMUNITY)
Admission: EM | Admit: 2013-03-10 | Discharge: 2013-03-11 | Disposition: A | Discharge status: Home / Self Care | Payer: 59 | Attending: Emergency Medicine | Admitting: Emergency Medicine

## 2013-03-10 ENCOUNTER — Encounter (HOSPITAL_COMMUNITY): Payer: Self-pay | Admitting: Emergency Medicine

## 2013-03-10 DIAGNOSIS — R112 Nausea with vomiting, unspecified: Secondary | ICD-10-CM | POA: Insufficient documentation

## 2013-03-10 DIAGNOSIS — Z792 Long term (current) use of antibiotics: Secondary | ICD-10-CM | POA: Insufficient documentation

## 2013-03-10 DIAGNOSIS — R42 Dizziness and giddiness: Secondary | ICD-10-CM | POA: Insufficient documentation

## 2013-03-10 DIAGNOSIS — M545 Low back pain, unspecified: Secondary | ICD-10-CM | POA: Insufficient documentation

## 2013-03-10 DIAGNOSIS — Z79899 Other long term (current) drug therapy: Secondary | ICD-10-CM | POA: Insufficient documentation

## 2013-03-10 DIAGNOSIS — Z8719 Personal history of other diseases of the digestive system: Secondary | ICD-10-CM | POA: Insufficient documentation

## 2013-03-10 DIAGNOSIS — R109 Unspecified abdominal pain: Secondary | ICD-10-CM | POA: Insufficient documentation

## 2013-03-10 DIAGNOSIS — F17200 Nicotine dependence, unspecified, uncomplicated: Secondary | ICD-10-CM | POA: Insufficient documentation

## 2013-03-10 DIAGNOSIS — R197 Diarrhea, unspecified: Secondary | ICD-10-CM | POA: Insufficient documentation

## 2013-03-10 LAB — URINALYSIS, ROUTINE W REFLEX MICROSCOPIC
Bilirubin Urine: NEGATIVE
Glucose, UA: NEGATIVE mg/dL
Hgb urine dipstick: NEGATIVE
Ketones, ur: NEGATIVE mg/dL
Leukocytes, UA: NEGATIVE
Nitrite: NEGATIVE
Protein, ur: NEGATIVE mg/dL
Specific Gravity, Urine: 1.013 (ref 1.005–1.030)
Urobilinogen, UA: 0.2 mg/dL (ref 0.0–1.0)
pH: 5.5 (ref 5.0–8.0)

## 2013-03-10 LAB — URINE CULTURE
Colony Count: NO GROWTH
Organism ID, Bacteria: NO GROWTH

## 2013-03-10 MED ORDER — ONDANSETRON HCL 4 MG/2ML IJ SOLN
4.0000 mg | Freq: Once | INTRAMUSCULAR | Status: AC
  Administered 2013-03-10: 4 mg via INTRAVENOUS
  Filled 2013-03-10: qty 2

## 2013-03-10 MED ORDER — MORPHINE SULFATE 4 MG/ML IJ SOLN
4.0000 mg | Freq: Once | INTRAMUSCULAR | Status: AC
  Administered 2013-03-10: 4 mg via INTRAVENOUS
  Filled 2013-03-10: qty 1

## 2013-03-10 MED ORDER — KETOROLAC TROMETHAMINE 30 MG/ML IJ SOLN
30.0000 mg | Freq: Once | INTRAMUSCULAR | Status: AC
  Administered 2013-03-10: 30 mg via INTRAVENOUS
  Filled 2013-03-10: qty 1

## 2013-03-10 NOTE — Telephone Encounter (Signed)
Patient called to let us know he is going to ER. Around 3pm today he started having severe low back pain. It comes and goes and it is very sharp. He is still having diarrhea that has been going on since Thursday. He states his stool has been black and green.

## 2013-03-10 NOTE — ED Notes (Signed)
Pt states that he has had N/V/D abd pain since Thurs and was seen for that; pt states that he was diagnosed with anemia; pt states that around 3pm he began to have rt flank pain; pt states that he is unable to move in any way with causing severe pain; pt reports numerous episodes of dark black and green diarrhea today; pt also c/o dizziness; no vomiting in the last 24 hrs; pt denies that he has urinary frequency; c/o pain to his back upon urination.

## 2013-03-10 NOTE — ED Provider Notes (Signed)
CSN: [id number]     Arrival date & time 03/10/13  1857 History   First MD Initiated Contact with Patient 03/10/13 1942     Chief Complaint  Patient presents with  . Flank Pain  . Abdominal Pain    HPI  Bruce Morales is a 37 y.o. male with a PMH of flank and abdominal pain who presents to the ED for evaluation of abdominal pain and flank pain.  History was provided by the patient.  Patient states that he was admitted on 02/27/13 for abdominal pain and hematemesis.  He had an EGD done on 10/1 which was negative.  He also had a CT scan done at that time which was negative.  He states he left the hospital because he was doing well, however, the hospitalist advised him to stay another day. Patient states that he was doing well until 2 days ago, he was at work and became very nauseated. He states that he has had nausea for the past 2 days.  He also has had green and black diarrhea for the past 2 days. No hematochezia or rectal bleeding. He denies any emesis today, however, had emesis two days ago. He states his been taking Phenergan which is helped with his nausea. He saw an associate of his primary care provider yesterday and had further testing which included stool and laboratory evaluation. He was prescribed Cipro for his diarrhea. He states that around 3 PM today he developed right-sided flank and lower back pain. He describes it as a sharp and aching sensation. His pain is constant with fluctuations in severity. He states that movement makes his pain worse. Nothing improves his pain. He has not taken anything for pain prior to arrival. He denies similar back and flank symptoms in the past. No history of kidney stones. No history of back pain in the past. No trauma or injury. He denies any fever, weakness, loss of sensation, numbness, loss of bowel or bladder function, dysuria, and hematuria. Patient states that he also developed abdominal pain with his right flank pain. His abdominal pain is located in  the lower middle and right lower quadrant. He states his pain is a "gurgling" sensation. He denies similar pain on his previous admission. He denies any recent travel.  He complaints of mild lightheadedness due to the pain.  No fever, chills, change in appetite/activity, rhinorrhea, congestion, sore throat, chest pain, SOB, or headache.      Past Medical History  Diagnosis Date  . Gall stones    Past Surgical History  Procedure Laterality Date  . Cholecystectomy    . Knee surgery      right knee  . Esophagogastroduodenoscopy N/A 02/27/2013    Procedure: ESOPHAGOGASTRODUODENOSCOPY (EGD);  Surgeon: Florencia Reasons, MD;  Location: [facility];  Service: Endoscopy;  Laterality: N/A;  . Gallbladder surgery     Family History  Problem Relation Age of Onset  . Multiple sclerosis Mother   . Heart failure Maternal Grandfather   . Diabetes Father   . Fibromyalgia Sister   . Schizophrenia Brother   . Stroke Maternal Grandmother   . Diabetes Paternal Grandmother    History  Substance Use Topics  . Smoking status: Current Every Day Smoker -- 0.50 packs/day    Types: Cigarettes  . Smokeless tobacco: Never Used  . Alcohol Use: No    Review of Systems  Constitutional: Negative for fever, chills, activity change, appetite change and fatigue.  HENT: Negative for congestion, mouth sores, rhinorrhea and  trouble swallowing.   Eyes: Negative for visual disturbance.  Respiratory: Negative for cough and shortness of breath.   Cardiovascular: Negative for chest pain and leg swelling.  Gastrointestinal: Positive for nausea, vomiting, abdominal pain and diarrhea. Negative for constipation, blood in stool, abdominal distention, anal bleeding and rectal pain.  Genitourinary: Negative for dysuria, hematuria, decreased urine volume, penile swelling, difficulty urinating, penile pain and testicular pain.  Musculoskeletal: Positive for back pain. Negative for arthralgias, gait problem, joint swelling,  myalgias and neck pain.  Skin: Negative for color change and wound.  Neurological: Positive for light-headedness. Negative for dizziness, syncope, weakness, numbness and headaches.  Psychiatric/Behavioral: Negative for confusion.    Allergies  Review of patient's allergies indicates no known allergies.  Home Medications   Current Outpatient Rx  Name  Route  Sig  Dispense  Refill  . ciprofloxacin (CIPRO) 500 MG tablet   Oral   Take 1 tablet (500 mg total) by mouth 2 (two) times daily.   20 tablet   0   . HYDROmorphone (DILAUDID) 4 MG tablet   Oral   Take 1 tablet (4 mg total) by mouth every 4 (four) hours as needed for pain.   90 tablet   0   . ondansetron (ZOFRAN) 4 MG tablet   Oral   Take 1 tablet (4 mg total) by mouth every 8 (eight) hours as needed for nausea.   45 tablet   1   . pantoprazole (PROTONIX) 40 MG tablet   Oral   Take 1 tablet (40 mg total) by mouth daily.   30 tablet   1   . promethazine (PHENERGAN) 12.5 MG tablet   Oral   Take 2 tablets (25 mg total) by mouth every 6 (six) hours as needed for nausea.   90 tablet   1    BP 130/74  Pulse 89  Temp(Src) 98.8 F (37.1 C) (Oral)  Resp 22  Ht 5\' 8"  (1.727 m)  Wt 211 lb (95.709 kg)  BMI 32.09 kg/m2  SpO2 99%  Filed Vitals:   03/10/13 1939 03/10/13 2245 03/10/13 2300  BP: 130/74 129/55 138/77  Pulse: 89 77 76  Temp: 98.8 F (37.1 C) 98.1 F (36.7 C)   TempSrc: Oral Oral   Resp: 22 18 16   Height: 5\' 8"  (1.727 m)    Weight: 211 lb (95.709 kg)    SpO2: 99% 96% 100%     Physical Exam  Nursing note and vitals reviewed. Constitutional: He is oriented to person, place, and time. He appears well-developed and well-nourished. No distress.  HENT:  Head: Normocephalic and atraumatic.  Right Ear: External ear normal.  Left Ear: External ear normal.  Nose: Nose normal.  Mouth/Throat: Oropharynx is clear and moist. No oropharyngeal exudate.  Eyes: Conjunctivae are normal. Right eye exhibits no  discharge. Left eye exhibits no discharge.  Neck: Normal range of motion. Neck supple.  No cervical spinal or paraspinal tenderness.    Cardiovascular: Normal rate, regular rhythm, normal heart sounds and intact distal pulses.  Exam reveals no gallop and no friction rub.   No murmur heard. Dorsalis pedis pulses present bilaterally  Pulmonary/Chest: Effort normal and breath sounds normal. No respiratory distress. He has no wheezes. He has no rales. He exhibits no tenderness.  Abdominal: Soft. Bowel sounds are normal. He exhibits no distension and no mass. There is tenderness. There is no rebound and no guarding.  Lower middle and RLQ tenderness the palpation. Right CVA tenderness.   Musculoskeletal: Normal  range of motion. He exhibits tenderness. He exhibits no edema.  No lumbar or thoracic spinal tenderness.  Tenderness to palpation to the right lower lumbar region diffusely.  Positive straight leg raise on the right. Patient able to flex and extend hips and knees bilaterally. Patient able to dorsiflex and plantarflex bilaterally without limitations. Strength 5/5 in the lower extremities bilaterally.   Neurological: He is alert and oriented to person, place, and time.  Gross sensation intact in the LE throughout bilaterally.   Skin: Skin is warm and dry. He is not diaphoretic.  No edema, erythema, ecchymosis to the back throughout    ED Course  Procedures (including critical care time) Labs Review Labs Reviewed - No data to display Imaging Review No results found.  EKG Interpretation   None       Previous results on 03/09/13  Results for orders placed in visit on 03/09/13  URINE CULTURE      Result Value Range   Colony Count NO GROWTH     Organism ID, Bacteria NO GROWTH    COMPREHENSIVE METABOLIC PANEL      Result Value Range   Sodium 139  135 - 145 mEq/L   Potassium 4.5  3.5 - 5.3 mEq/L   Chloride 107  96 - 112 mEq/L   CO2 26  19 - 32 mEq/L   Glucose, Bld 104 (*) 70 - 99  mg/dL   BUN 14  6 - 23 mg/dL   Creat 1.61  0.96 - 0.45 mg/dL   Total Bilirubin 0.6  0.3 - 1.2 mg/dL   Alkaline Phosphatase 63  39 - 117 U/L   AST 20  0 - 37 U/L   ALT 48  0 - 53 U/L   Total Protein 7.1  6.0 - 8.3 g/dL   Albumin 4.6  3.5 - 5.2 g/dL   Calcium 9.7  8.4 - 40.9 mg/dL  POCT UA - MICROSCOPIC ONLY      Result Value Range   WBC, Ur, HPF, POC 1-3     RBC, urine, microscopic 4-6     Bacteria, U Microscopic 2 plus'     Mucus, UA positv     Epithelial cells, urine per micros 0-1     Crystals, Ur, HPF, POC neg     Casts, Ur, LPF, POC neg     Yeast, UA neg    POCT URINALYSIS DIPSTICK      Result Value Range   Color, UA yellow     Clarity, UA clear     Glucose, UA neg     Bilirubin, UA nege     Ketones, UA neg     Spec Grav, UA >=1.030     Blood, UA neg     pH, UA 5.0     Protein, UA neg     Urobilinogen, UA 0.2     Nitrite, UA neg     Leukocytes, UA Negative    POCT CBC      Result Value Range   WBC 7.2  4.6 - 10.2 K/uL   Lymph, poc 2.1  0.6 - 3.4   POC LYMPH PERCENT 29.3  10 - 50 %L   MID (cbc) 0.4  0 - 0.9   POC MID % 5.4  0 - 12 %M   POC Granulocyte 4.7  2 - 6.9   Granulocyte percent 65.3  37 - 80 %G   RBC 5.05  4.69 - 6.13 M/uL   Hemoglobin 15.5  14.1 -  18.1 g/dL   HCT, POC 40.9  81.1 - 53.7 %   MCV 94.1  80 - 97 fL   MCH, POC 30.7  27 - 31.2 pg   MCHC 32.6  31.8 - 35.4 g/dL   RDW, POC 91.4     Platelet Count, POC 178  142 - 424 K/uL   MPV 11.0  0 - 99.8 fL  IFOBT (OCCULT BLOOD)      Result Value Range   IFOBT Negative    IFOBT (OCCULT BLOOD)      Result Value Range   IFOBT Negative      02/27/13  CLINICAL DATA: Abdominal pain.  EXAM:  CT ABDOMEN AND PELVIS WITH CONTRAST  TECHNIQUE:  Multidetector CT imaging of the abdomen and pelvis was performed  using the standard protocol following bolus administration of  intravenous contrast.  CONTRAST: OMNIPAQUE IOHEXOL 300 MG/ML SOLN  COMPARISON: 04/01/2004.  FINDINGS:  The lung bases are clear  except for dependent bibasilar atelectasis.  The liver is unremarkable. No focal hepatic lesions or intrahepatic  biliary dilatation. The gallbladder is surgically absent and there  is mild associated common bile duct dilatation. Mild central  intrahepatic prominence also. The pancreas is normal. The spleen is  normal. The adrenal glands and kidneys are normal.  The stomach, duodenum, small bowel and colon are unremarkable. No  inflammatory changes or mass lesions. No obstructive findings. The  appendix is normal. No mesenteric or retroperitoneal mass or  adenopathy. Small scattered nodes are noted. These appear stable.  The aorta is normal in caliber. The major branch vessels are normal.  The bladder, prostate gland and seminal vesicles are normal. No  pelvic mass, adenopathy or free pelvic fluid collections. No  inguinal mass or hernia.  The bony structures are intact.  IMPRESSION:  No acute abdominal/ pelvic findings, mass lesions or adenopathy.  Status post cholecystectomy with mild intra and extrahepatic biliary  dilatation.   02/28/13  EGD - negative   Urinalysis 03/10/13     Component Value Date/Time   COLORURINE YELLOW 03/10/2013 2013   APPEARANCEUR CLEAR 03/10/2013 2013   LABSPEC 1.013 03/10/2013 2013   PHURINE 5.5 03/10/2013 2013   GLUCOSEU NEGATIVE 03/10/2013 2013   HGBUR NEGATIVE 03/10/2013 2013   BILIRUBINUR NEGATIVE 03/10/2013 2013   BILIRUBINUR nege 03/09/2013 0952   KETONESUR NEGATIVE 03/10/2013 2013   PROTEINUR NEGATIVE 03/10/2013 2013   UROBILINOGEN 0.2 03/10/2013 2013   UROBILINOGEN 0.2 03/09/2013 0952   NITRITE NEGATIVE 03/10/2013 2013   NITRITE neg 03/09/2013 0952   LEUKOCYTESUR NEGATIVE 03/10/2013 2013        CT Abdomen Pelvis Wo Contrast (Final result)  Result time: 03/10/13 20:54:46    Final result by Rad Results In Interface (03/10/13 20:54:46)    Narrative:   CLINICAL DATA: Right-sided flank pain.  EXAM: CT ABDOMEN AND PELVIS WITHOUT  CONTRAST  TECHNIQUE: Multidetector CT imaging of the abdomen and pelvis was performed following the standard protocol without intravenous contrast.  COMPARISON: CT of the abdomen and pelvis 02/27/2013.  FINDINGS: Lung Bases: Unremarkable.  Abdomen/Pelvis: There are no abnormal calcifications within the collecting system of either kidney, along the course of either ureter, or within the lumen of the urinary bladder. No hydroureteronephrosis or perinephric stranding to suggest urinary tract obstruction at this time. The unenhanced appearance of the kidneys is unremarkable bilaterally.  Status post cholecystectomy. The unenhanced appearance of the liver, pancreas, spleen and bilateral adrenal glands is unremarkable. Normal appendix. No significant volume of ascites. No  pneumoperitoneum. No pathologic distention of small bowel. No definite lymphadenopathy identified within the abdomen or pelvis. There are numerous tiny retroperitoneal lymph nodes which are conspicuous in number rather than size, but are highly nonspecific and similar to the prior examination. Prostate gland and urinary bladder are unremarkable in appearance.  Musculoskeletal: There are no aggressive appearing lytic or blastic lesions noted in the visualized portions of the skeleton.  IMPRESSION: 1. No acute findings in the abdomen or pelvis to account for the patient's symptoms. Specifically, no abnormal urinary tract calculi or findings to suggest urinary tract obstruction. 2. Normal appendix. 3. Status post cholecystectomy.   Electronically Signed By: Trudie Reed M.D. On: 03/10/2013 20:54     MDM   1. Right low back pain   2. Abdominal pain   3. Diarrhea     Bruce Morales is a 37 y.o. male with a PMH of flank and abdominal pain who presents to the ED for evaluation of abdominal pain and flank pain.  UA ordered.  CT abdomen and pelvis without contrast.  Zofran ordered. Toradol for pain.   Patient recently had laboratory evaluation with a CBC, CMP, guaiac, stool O&P, and urine culture done yesterday.  He was recently admitted and had an EGD and CT of the abdomen and pelvis which was negative.     Rechecks  8:39 PM = patient still experiencing pain.  Toradol just given.  9:00 PM = Patient states he pain is not well controlled.  4 mg morphine ordered.  10:00 PM = Patient resting comfortably with family.  Pain much improved.  States he may have picked his up daughter (toddler), which may have exacerbated his lower back.  No trauma.   10:45 PM = Patient able to ambulate however it increases his pain.  Patient has significant pain sitting up from laying flat.  Patient's pain improves when he lays flat.  Will order 4 mg morphine and then re-evaluate pain.  Likely discharge home on tramadol and flexeril.     Etiology of symptoms is possibly musculoskeletal in nature.  Lumbar strain is a possible etiology.  Patient admits he has been picking up his daughter, which may have exacerbated his symptoms.  He has pain to palpation to the right lower lumbar region.  His pain is worse with movement.  He had no weakness and was neurovascularly intact.  His CT scan was negative for kidney stones and appendicitis and his urine was WNL.  His pain improved with medication in the ED and he remained in no acute distress.  He recently had an extensive inpatient and outpatient (PCP) evaluation for abdominal pain and diarrhea.  Labs were not repeated which were performed yesterday at his PCP's office.  He had a negative guaiac at that time.  He is currently taking cipro for diarrhea prescribed by his PCP.  Patient was instructed to follow-up with his PCP regarding his abdominal pain and diarrhea.  He was instructed to follow-up with his orthopedic physician regarding his back pain.  Patient was instructed to continue taking his cipro and was prescribed tramadol and flexeril for outpatient management.  Patient was  instructed to return to the ED if they experience any severe worsening pain, loss of bowel/bladder function, fever, repeated vomiting, blood in stool/vomit, weakness, loss of sensation, or other concerns.  Patient was in agreement with discharge and plan.  His wife is in the ED who will drive him home from the ED.     Final impressions: 1. Right lower  back pain  2. Abdominal pain  3. Diarrhea     Luiz Iron PA-C   This patient was discussed with Dr. Julio Sicks, PA-C 03/11/13 1551

## 2013-03-10 NOTE — ED Notes (Signed)
Patient transported to CT 

## 2013-03-10 NOTE — ED Notes (Signed)
Pt states Morphine helped for a short time then pain started edging back up,  Pt is alert and oriented

## 2013-03-11 ENCOUNTER — Telehealth (HOSPITAL_COMMUNITY): Payer: Self-pay | Admitting: Emergency Medicine

## 2013-03-11 MED ORDER — TRAMADOL HCL 50 MG PO TABS: 50.0000 mg | ORAL_TABLET | Freq: Four times a day (QID) | ORAL | Status: DC | PRN

## 2013-03-11 MED ORDER — CYCLOBENZAPRINE HCL 5 MG PO TABS: 5.0000 mg | ORAL_TABLET | Freq: Three times a day (TID) | ORAL | Status: DC | PRN

## 2013-03-11 NOTE — ED Provider Notes (Signed)
Medical screening examination/treatment/procedure(s) were performed by non-physician practitioner and as supervising physician I was immediately available for consultation/collaboration.    Vida Roller, MD 03/11/13 [phone]

## 2013-03-15 ENCOUNTER — Ambulatory Visit: Payer: 59 | Admitting: Internal Medicine

## 2013-03-15 VITALS — BP 130/90 | HR 69 | Temp 98.6°F | Resp 16 | Ht 68.0 in | Wt 216.0 lb

## 2013-03-15 DIAGNOSIS — M5126 Other intervertebral disc displacement, lumbar region: Secondary | ICD-10-CM

## 2013-03-15 DIAGNOSIS — M549 Dorsalgia, unspecified: Secondary | ICD-10-CM

## 2013-03-15 MED ORDER — OXYCODONE-ACETAMINOPHEN 10-325 MG PO TABS: 1.0000 | ORAL_TABLET | Freq: Three times a day (TID) | ORAL | Status: DC | PRN

## 2013-03-15 NOTE — Progress Notes (Signed)
  Subjective:    Patient ID: Bruce Morales, male    DOB: 11-Aug-1975, 37 y.o.   MRN: [id number]  HPI    Review of Systems     Objective:   Physical Exam        Assessment & Plan:

## 2013-03-15 NOTE — Patient Instructions (Signed)
You are to see Dr. Yevette Edwards tomorrow at 11:45am at [facility].

## 2013-03-15 NOTE — Progress Notes (Signed)
  Subjective:    Patient ID: Bruce Morales, male    DOB: Feb 03, 1976, 37 y.o.   MRN: [id number]  HPI  37 y.o. Male present to clinic with back pain since Saturday. Has had nausea, vomiting. Was seen in ER for this, referred to ortho and physical therapy and states that he just doesn't feel better. 8 out of 10. Was prescribed tizinadine? And tylenol 3. Trouble with movement. Pain starts in buttocks and radiates into the middle back. He has not been getting any relief with Tylenol 3. He is also not getting any help from Physical Therapy. He is unable to bend and down. He has severe pain with sitting. The best position for him is to lay in his stomach flat. No incontinence, sensory loss.  Cannot plantar flex due to pain . Review of Systems See CT scan and labs from er visit    Objective:   Physical Exam  Vitals reviewed. Constitutional: He is oriented to person, place, and time. He appears well-developed and well-nourished. He appears distressed.  HENT:  Head: Normocephalic.  Eyes: EOM are normal.  Neck: Neck supple.  Pulmonary/Chest: Effort normal.  Musculoskeletal:       Lumbar back: He exhibits decreased range of motion, tenderness, bony tenderness, pain and spasm. He exhibits no swelling, no edema, no deformity, no laceration and normal pulse.  Neurological: He is alert and oriented to person, place, and time. He has normal strength. No sensory deficit. Coordination and gait abnormal.  Reflex Scores:      Patellar reflexes are 3+ on the right side and 3+ on the left side.      Achilles reflexes are 3+ on the right side and 1+ on the left side. Straight leg positive left and right  Psychiatric: He has a normal mood and affect. His behavior is normal. Judgment and thought content normal.   Can only lie down on belly, severe pain any other position,sitting not possible  Results for orders placed during the hospital encounter of 03/10/13  URINALYSIS, ROUTINE W REFLEX MICROSCOPIC   Result Value Range   Color, Urine YELLOW  YELLOW   APPearance CLEAR  CLEAR   Specific Gravity, Urine 1.013  1.005 - 1.030   pH 5.5  5.0 - 8.0   Glucose, UA NEGATIVE  NEGATIVE mg/dL   Hgb urine dipstick NEGATIVE  NEGATIVE   Bilirubin Urine NEGATIVE  NEGATIVE   Ketones, ur NEGATIVE  NEGATIVE mg/dL   Protein, ur NEGATIVE  NEGATIVE mg/dL   Urobilinogen, UA 0.2  0.0 - 1.0 mg/dL   Nitrite NEGATIVE  NEGATIVE   Leukocytes, UA NEGATIVE  NEGATIVE        Assessment & Plan:  Possible Disc herniation caused by vomiting Schedule MRI/Spine surgeon revisit Is able to take oxycodone/is allergic to [facility]

## 2013-03-25 ENCOUNTER — Ambulatory Visit
Admission: RE | Admit: 2013-03-25 | Discharge: 2013-03-25 | Disposition: A | Discharge status: Home / Self Care | Payer: 59 | Source: Ambulatory Visit | Attending: Internal Medicine | Admitting: Internal Medicine

## 2013-03-25 DIAGNOSIS — M549 Dorsalgia, unspecified: Secondary | ICD-10-CM

## 2013-04-05 ENCOUNTER — Other Ambulatory Visit: Payer: Self-pay

## 2014-03-31 ENCOUNTER — Ambulatory Visit (INDEPENDENT_AMBULATORY_CARE_PROVIDER_SITE_OTHER): Payer: 59 | Admitting: Emergency Medicine

## 2014-03-31 VITALS — BP 118/78 | HR 77 | Temp 98.5°F | Resp 18 | Ht 68.0 in | Wt 228.2 lb

## 2014-03-31 DIAGNOSIS — J40 Bronchitis, not specified as acute or chronic: Secondary | ICD-10-CM

## 2014-03-31 DIAGNOSIS — J209 Acute bronchitis, unspecified: Secondary | ICD-10-CM

## 2014-03-31 DIAGNOSIS — J019 Acute sinusitis, unspecified: Secondary | ICD-10-CM

## 2014-03-31 MED ORDER — ALBUTEROL SULFATE HFA 108 (90 BASE) MCG/ACT IN AERS: 2.0000 | INHALATION_SPRAY | RESPIRATORY_TRACT | Status: DC | PRN

## 2014-03-31 MED ORDER — AMOXICILLIN-POT CLAVULANATE 875-125 MG PO TABS: 1.0000 | ORAL_TABLET | Freq: Two times a day (BID) | ORAL | Status: DC

## 2014-03-31 MED ORDER — PROMETHAZINE-CODEINE 6.25-10 MG/5ML PO SYRP: 5.0000 mL | ORAL_SOLUTION | ORAL | Status: DC | PRN

## 2014-03-31 NOTE — Progress Notes (Signed)
[facility] [address], [place] [id number] [phone]  Date:  03/31/2014   Name:  Bruce Morales   DOB:  Jul 20, 1975   MRN:  [id number]  PCP:  No primary care provider on file.    Chief Complaint: Dental Pain; Cough; and Chest Pain   History of Present Illness:  Bruce Morales is a 38 y.o. very pleasant male patient who presents with the following:  Sudden onset pain in left maxillary tooth pain.  Radiating into TMJ and left temple.  Sensitive to heat and cold. 1 1/2 week cough productive purulent sputum.  Worse at night.  Wheezing and exertional shortness of breath No nasal congestion or drainage.  Post nasal drainage. No fever or chills.  No nausea or vomiting.  No stool change. No rash Smokes. No improvement with over the counter medications or other home remedies.  Denies other complaint or health concern today.    Patient Active Problem List   Diagnosis Date Noted  . GI bleed 02/27/2013    Past Medical History  Diagnosis Date  . Gall stones     Past Surgical History  Procedure Laterality Date  . Cholecystectomy    . Knee surgery      right knee  . Esophagogastroduodenoscopy N/A 02/27/2013    Procedure: ESOPHAGOGASTRODUODENOSCOPY (EGD);  Surgeon: Florencia Reasonsobert V Buccini, MD;  [facility];  Service: Endoscopy;  Laterality: N/A;  . Gallbladder surgery      History  Substance Use Topics  . Smoking status: Current Every Day Smoker -- 0.50 packs/day    Types: Cigarettes  . Smokeless tobacco: Never Used  . Alcohol Use: No    Family History  Problem Relation Age of Onset  . Multiple sclerosis Mother   . Heart failure Maternal Grandfather   . Diabetes Father   . Fibromyalgia Sister   . Schizophrenia Brother   . Stroke Maternal Grandmother   . Diabetes Paternal Grandmother     Allergies  Allergen Reactions  . Hydrocodone Hives and Itching    Medication list has been reviewed and updated.  Current Outpatient  Prescriptions on File Prior to Visit  Medication Sig Dispense Refill  . Acetaminophen-Codeine (TYLENOL/CODEINE #3) 300-30 MG per tablet Take 1 tablet by mouth every 4 (four) hours as needed for pain.    . ciprofloxacin (CIPRO) 500 MG tablet Take 1 tablet (500 mg total) by mouth 2 (two) times daily. 20 tablet 0  . cyclobenzaprine (FLEXERIL) 5 MG tablet Take 1 tablet (5 mg total) by mouth 3 (three) times daily as needed for muscle spasms. 9 tablet 0  . HYDROmorphone (DILAUDID) 4 MG tablet Take 1 tablet (4 mg total) by mouth every 4 (four) hours as needed for pain. 90 tablet 0  . ondansetron (ZOFRAN) 4 MG tablet Take 1 tablet (4 mg total) by mouth every 8 (eight) hours as needed for nausea. 45 tablet 1  . oxyCODONE-acetaminophen (PERCOCET) 10-325 MG per tablet Take 1 tablet by mouth every 8 (eight) hours as needed for pain. 20 tablet 0  . pantoprazole (PROTONIX) 40 MG tablet Take 1 tablet (40 mg total) by mouth daily. 30 tablet 1  . promethazine (PHENERGAN) 12.5 MG tablet Take 2 tablets (25 mg total) by mouth every 6 (six) hours as needed for nausea. 90 tablet 1  . tiZANidine (ZANAFLEX) 4 MG tablet Take 4 mg by mouth every 6 (six) hours as needed.    . traMADol (ULTRAM) 50 MG tablet Take 1 tablet (  50 mg total) by mouth every 6 (six) hours as needed for pain. 15 tablet 0   No current facility-administered medications on file prior to visit.    Review of Systems:  As per HPI, otherwise negative.    Physical Examination: Filed Vitals:   03/31/14 0821  BP: 118/78  Pulse: 77  Temp: 98.5 F (36.9 C)  Resp: 18   Filed Vitals:   03/31/14 0821  Height: 5\' 8"  (1.727 m)  Weight: 228 lb 3.2 oz (103.511 kg)   Body mass index is 34.71 kg/(m^2). Ideal Body Weight: Weight in (lb) to have BMI = 25: 164.1  GEN: WDWN, NAD, Non-toxic, A & O x 3 HEENT: Atraumatic, Normocephalic. Neck supple. No masses, No LAD.  Left upper jaw gingivitis.   Ears and Nose: No external deformity. CV: RRR, No M/G/R. No  JVD. No thrill. No extra heart sounds. PULM: generalized wheezes, no crackles, rhonchi. No retractions. No resp. distress. No accessory muscle use. ABD: S, NT, ND, +BS. No rebound. No HSM. EXTR: No c/c/e NEURO Normal gait.  PSYCH: Normally interactive. Conversant. Not depressed or anxious appearing.  Calm demeanor.    Assessment and Plan: Bronchitis with bronchospasm Gingivitis augmentin Phen c cod Albuterol  Signed,  Phillips OdorJeffery Zahki Hoogendoorn, MD

## 2014-03-31 NOTE — Patient Instructions (Signed)
Gingivitis Gingivitis is a form of gum (periodontal) disease that causes redness, soreness, and swelling (inflammation) of your gums. CAUSES The most common cause of gingivitis is poor oral hygiene. A sticky substance made of bacteria, mucus, and food particles (plaque), is deposited on the exposed part of teeth. As plaque builds up, it reacts with the saliva in your mouth to form something called  tartar. Tartar is a hard deposit that becomes trapped around the base of the tooth. Plaque and tartar irritate the gums, leading to the formation of gingivitis. Other factors that increase your risk for gingivitis include:   Tobacco use.  Diabetes.  Older age.  Certain medications.  Certain viral or fungal infections.  Dry mouth.  Hormonal changes such as during pregnancy.  Poor nutrition.  Substance abuse.  Poor fitting dental restorations or appliances. SYMPTOMS You may notice inflammation of the soft tissue (gingiva) around the teeth. When these tissues become inflamed, they bleed easily, especially during flossing or brushing. The gums may also be:   Tender to the touch.  Bright red, purple red, or have a shiny appearance.  Swollen.  Wearing away from the teeth (receding), which exposes more of the tooth. Bad breath is often present. Continued infection around teeth can eventually cause cavities and loosen teeth. This may lead to eventual tooth loss. DIAGNOSIS A medical and dental history will be taken. Your mouth, teeth, and gums will be examined. Your dentist will look for soft, swollen purple-red, irritated gums. There may be deposits of plaque and tartar at the base of the teeth. Your gums will be looked at for the degree of redness, puffiness, and bleeding tendencies. Your dentist will see if any of the teeth are loose. X-rays may be taken to see if the inflammation has spread to the supporting structures of the teeth. TREATMENT The goal is to reduce and reverse the  inflammation. Proper treatment can usually reverse the symptoms of gingivitis and prevent further progression of the disease. Have your teeth cleaned. During the cleaning, all plaque and tartar will be removed. Instruction for proper home care will be given. You will need regular professional cleanings and check-ups in the future. HOME CARE INSTRUCTIONS  Brush your teeth twice a day and floss at least once per day. When flossing, it is best to floss first then brush.  Limit sugar between meals and maintain a well-balanced diet.  Even the best dental hygiene will not prevent plaque from developing. It is necessary for you to see your dentist on a regular basis for cleaning and regular checkups.  Your dentist can recommend proper oral hygiene and mouth care and suggest special toothpastes or mouth rinses.  Stop smoking. SEEK DENTAL OR MEDICAL CARE IF:  You have painful, reddened tissue around your teeth, or you have puffy swollen gums.  You have difficulty chewing.  You notice any loose or infected teeth.  You have swollen glands.  Your gums bleed easily when you brush your teeth or are very tender to the touch. Document Released: 11/10/2000 Document Revised: 08/09/2011 Document Reviewed: 08/21/2010 [facility] Patient Information 2015 [place]. This information is not intended to replace advice given to you by your health care provider. Make sure you discuss any questions you have with your health care provider. Metered Dose Inhaler (No Spacer Used) Inhaled medicines are the basis of treatment for asthma and other breathing problems. Inhaled medicine can only be effective if used properly. Good technique assures that the medicine reaches the lungs. Metered dose  inhalers (MDIs) are used to deliver a variety of inhaled medicines. These include quick relief or rescue medicines (such as bronchodilators) and controller medicines (such as corticosteroids). The medicine is delivered by  pushing down on a metal canister to release a set amount of spray. If you are using different kinds of inhalers, use your quick relief medicine to open the airways 10-15 minutes before using a steroid, if instructed to do so by your health care provider. If you are unsure which inhalers to use and the order of using them, ask your health care provider, nurse, or respiratory therapist. HOW TO USE THE INHALER  Remove the cap from the inhaler.  If you are using the inhaler for the first time, you will need to prime it. Shake the inhaler for 5 seconds and release four puffs into the air, away from your face. Ask your health care provider or pharmacist if you have questions about priming your inhaler.  Shake the inhaler for 5 seconds before each breath in (inhalation).  Position the inhaler so that the top of the canister faces up.  Put your index finger on the top of the medicine canister. Your thumb supports the bottom of the inhaler.  Open your mouth.  Either place the inhaler between your teeth and place your lips tightly around the mouthpiece, or hold the inhaler 1-2 inches away from your open mouth. If you are unsure of which technique to use, ask your health care provider.  Breathe out (exhale) normally and as completely as possible.  Press the canister down with the index finger to release the medicine.  At the same time as the canister is pressed, inhale deeply and slowly until your lungs are completely filled. This should take 4-6 seconds. Keep your tongue down.  Hold the medicine in your lungs for 5-10 seconds (10 seconds is best). This helps the medicine get into the small airways of your lungs.  Breathe out slowly, through pursed lips. Whistling is an example of pursed lips.  Wait at least 1 minute between puffs. Continue with the above steps until you have taken the number of puffs your health care provider has ordered. Do not use the inhaler more than your health care provider  directs you to.  Replace the cap on the inhaler.  Follow the directions from your health care provider or the inhaler insert for cleaning the inhaler. If you are using a steroid inhaler, after your last puff, rinse your mouth with water, gargle, and spit out the water. Do not swallow the water. AVOID:  Inhaling before or after starting the spray of medicine. It takes practice to coordinate your breathing with triggering the spray.  Inhaling through the nose (rather than the mouth) when triggering the spray. HOW TO DETERMINE IF YOUR INHALER IS FULL OR NEARLY EMPTY You cannot know when an inhaler is empty by shaking it. Some inhalers are now being made with dose counters. Ask your health care provider for a prescription that has a dose counter if you feel you need that extra help. If your inhaler does not have a counter, ask your health care provider to help you determine the date you need to refill your inhaler. Write the refill date on a calendar or your inhaler canister. Refill your inhaler 7-10 days before it runs out. Be sure to keep an adequate supply of medicine. This includes making sure it has not expired, and making sure you have a spare inhaler. SEEK MEDICAL CARE IF:  Symptoms are only partially relieved with your inhaler.  You are having trouble using your inhaler.  You experience an increase in phlegm. SEEK IMMEDIATE MEDICAL CARE IF:  You feel little or no relief with your inhalers. You are still wheezing and feeling shortness of breath, tightness in your chest, or both.  You have dizziness, headaches, or a fast heart rate.  You have chills, fever, or night sweats.  There is a noticeable increase in phlegm production, or there is blood in the phlegm. MAKE SURE YOU:  Understand these instructions.  Will watch your condition.  Will get help right away if you are not doing well or get worse. Document Released: 03/14/2007 Document Revised: 10/01/2013 Document Reviewed:  11/02/2012 [facility] Patient Information 2015 [place], [place]. This information is not intended to replace advice given to you by your health care provider. Make sure you discuss any questions you have with your health care provider.

## 2014-04-13 ENCOUNTER — Ambulatory Visit (INDEPENDENT_AMBULATORY_CARE_PROVIDER_SITE_OTHER): Payer: 59 | Admitting: Family Medicine

## 2014-04-13 VITALS — BP 130/80 | HR 77 | Temp 98.0°F | Resp 16 | Ht 68.5 in | Wt 232.4 lb

## 2014-04-13 DIAGNOSIS — M545 Low back pain, unspecified: Secondary | ICD-10-CM

## 2014-04-13 MED ORDER — METAXALONE 800 MG PO TABS
800.0000 mg | ORAL_TABLET | Freq: Three times a day (TID) | ORAL | Status: DC
Start: 2014-04-13 — End: 2014-04-13

## 2014-04-13 MED ORDER — CYCLOBENZAPRINE HCL 5 MG PO TABS: 5.0000 mg | ORAL_TABLET | Freq: Three times a day (TID) | ORAL | Status: DC | PRN

## 2014-04-13 MED ORDER — DICLOFENAC SODIUM 75 MG PO TBEC
75.0000 mg | DELAYED_RELEASE_TABLET | Freq: Two times a day (BID) | ORAL | Status: DC
Start: 2014-04-13 — End: 2014-09-13

## 2014-04-13 NOTE — Progress Notes (Signed)
Subjective: Patient is here for chronic low back pain. He was seen last year, treated, then referred on to orthopedist. He did not feel like he got a lot of relief. He is continued to hurt the lately it's gotten worse again. He comes in here requesting referral to a Dr. Ardell Isaacsavid Spivey, who appears to be primarily pain management.. He hurts down in his back from mid back down to the SI joint regions.  Objective: VS tight muscles all the way down the paraspinous muscle area of his back. Flexion is limited to about 50%. He is tender at the SI joint regions.  Assessment: Chronic low back pain  Plan: Diclofenac Flexeril (patient declined Skelaxin) Referral to the pain doctor of his choice, Dr. Ardell Isaacsavid Spivey

## 2014-04-13 NOTE — Patient Instructions (Signed)
Take Flexeril (cyclobenzaprine)2-3 times daily as needed for muscle relaxant  Take diclofenac one twice daily with food for pain and inflammation and back  Referral is being made to Dr. Jordan LikesSpivey per your request  Return if problems persist we can be of assistance.

## 2014-04-14 ENCOUNTER — Encounter (HOSPITAL_COMMUNITY): Payer: Self-pay | Admitting: Nurse Practitioner

## 2014-04-14 ENCOUNTER — Emergency Department (HOSPITAL_COMMUNITY): Payer: 59

## 2014-04-14 ENCOUNTER — Ambulatory Visit (INDEPENDENT_AMBULATORY_CARE_PROVIDER_SITE_OTHER): Payer: 59

## 2014-04-14 ENCOUNTER — Emergency Department (HOSPITAL_COMMUNITY)
Admission: EM | Admit: 2014-04-14 | Discharge: 2014-04-14 | Disposition: A | Discharge status: Home / Self Care | Payer: 59 | Attending: Emergency Medicine | Admitting: Emergency Medicine

## 2014-04-14 ENCOUNTER — Ambulatory Visit (INDEPENDENT_AMBULATORY_CARE_PROVIDER_SITE_OTHER): Payer: 59 | Admitting: Internal Medicine

## 2014-04-14 VITALS — BP 120/78 | HR 124 | Temp 98.2°F | Resp 18 | Ht 69.0 in | Wt 232.0 lb

## 2014-04-14 DIAGNOSIS — R1010 Upper abdominal pain, unspecified: Secondary | ICD-10-CM

## 2014-04-14 DIAGNOSIS — Z791 Long term (current) use of non-steroidal anti-inflammatories (NSAID): Secondary | ICD-10-CM | POA: Diagnosis not present

## 2014-04-14 DIAGNOSIS — Z720 Tobacco use: Secondary | ICD-10-CM | POA: Diagnosis not present

## 2014-04-14 DIAGNOSIS — R1011 Right upper quadrant pain: Secondary | ICD-10-CM

## 2014-04-14 DIAGNOSIS — Z9889 Other specified postprocedural states: Secondary | ICD-10-CM | POA: Diagnosis not present

## 2014-04-14 DIAGNOSIS — M545 Low back pain, unspecified: Secondary | ICD-10-CM | POA: Insufficient documentation

## 2014-04-14 DIAGNOSIS — R079 Chest pain, unspecified: Secondary | ICD-10-CM | POA: Diagnosis present

## 2014-04-14 DIAGNOSIS — G8929 Other chronic pain: Secondary | ICD-10-CM | POA: Diagnosis not present

## 2014-04-14 DIAGNOSIS — Z79899 Other long term (current) drug therapy: Secondary | ICD-10-CM | POA: Diagnosis not present

## 2014-04-14 DIAGNOSIS — Z9089 Acquired absence of other organs: Secondary | ICD-10-CM | POA: Diagnosis not present

## 2014-04-14 DIAGNOSIS — R0789 Other chest pain: Secondary | ICD-10-CM | POA: Insufficient documentation

## 2014-04-14 DIAGNOSIS — R1013 Epigastric pain: Secondary | ICD-10-CM | POA: Insufficient documentation

## 2014-04-14 DIAGNOSIS — Z8719 Personal history of other diseases of the digestive system: Secondary | ICD-10-CM | POA: Diagnosis not present

## 2014-04-14 DIAGNOSIS — M1990 Unspecified osteoarthritis, unspecified site: Secondary | ICD-10-CM | POA: Insufficient documentation

## 2014-04-14 DIAGNOSIS — F17200 Nicotine dependence, unspecified, uncomplicated: Secondary | ICD-10-CM | POA: Insufficient documentation

## 2014-04-14 DIAGNOSIS — I7100 Dissection of unspecified site of aorta: Secondary | ICD-10-CM

## 2014-04-14 HISTORY — DX: Low back pain: M54.5

## 2014-04-14 HISTORY — DX: Low back pain, unspecified: M54.50

## 2014-04-14 HISTORY — DX: Other chronic pain: G89.29

## 2014-04-14 HISTORY — DX: Unspecified osteoarthritis, unspecified site: M19.90

## 2014-04-14 LAB — COMPLETE METABOLIC PANEL WITH GFR
ALT: 40 U/L (ref 0–53)
AST: 21 U/L (ref 0–37)
Albumin: 4.2 g/dL (ref 3.5–5.2)
Alkaline Phosphatase: 52 U/L (ref 39–117)
BUN: 18 mg/dL (ref 6–23)
CO2: 24 mEq/L (ref 19–32)
Calcium: 9 mg/dL (ref 8.4–10.5)
Chloride: 107 mEq/L (ref 96–112)
Creat: 0.95 mg/dL (ref 0.50–1.35)
GFR, Est African American: >89 mL/min
GFR, Est Non African American: >89 mL/min
Glucose, Bld: 96 mg/dL (ref 70–99)
Potassium: 4.2 mEq/L (ref 3.5–5.3)
Sodium: 140 mEq/L (ref 135–145)
Total Bilirubin: 0.6 mg/dL (ref 0.2–1.2)
Total Protein: 6.4 g/dL (ref 6.0–8.3)

## 2014-04-14 LAB — POCT CBC
Granulocyte percent: 58.3 %G (ref 37–80)
HCT, POC: 43.5 % (ref 43.5–53.7)
Hemoglobin: 14.6 g/dL (ref 14.1–18.1)
Lymph, poc: 2.5 (ref 0.6–3.4)
MCH, POC: 29.7 pg (ref 27–31.2)
MCHC: 33.5 g/dL (ref 31.8–35.4)
MCV: 88.5 fL (ref 80–97)
MID (cbc): 0.6 (ref 0–0.9)
MPV: 9.1 fL (ref 0–99.8)
POC Granulocyte: 4.3 (ref 2–6.9)
POC LYMPH PERCENT: 33.8 %L (ref 10–50)
POC MID %: 7.9 %M (ref 0–12)
Platelet Count, POC: 125 10*3/uL — AB (ref 142–424)
RBC: 4.91 M/uL (ref 4.69–6.13)
RDW, POC: 12.6 %
WBC: 7.4 10*3/uL (ref 4.6–10.2)

## 2014-04-14 LAB — CBC
HCT: 40.4 % (ref 39.0–52.0)
Hemoglobin: 13.9 g/dL (ref 13.0–17.0)
MCH: 30.2 pg (ref 26.0–34.0)
MCHC: 34.4 g/dL (ref 30.0–36.0)
MCV: 87.6 fL (ref 78.0–100.0)
Platelets: 132 10*3/uL — ABNORMAL LOW (ref 150–400)
RBC: 4.61 MIL/uL (ref 4.22–5.81)
RDW: 12.3 % (ref 11.5–15.5)
WBC: 7.5 10*3/uL (ref 4.0–10.5)

## 2014-04-14 LAB — LIPID PANEL
Cholesterol: 189 mg/dL (ref 0–200)
HDL: 31 mg/dL — ABNORMAL LOW (ref >39)
LDL Cholesterol: 118 mg/dL — ABNORMAL HIGH (ref 0–99)
Total CHOL/HDL Ratio: 6.1 Ratio
Triglycerides: 198 mg/dL — ABNORMAL HIGH (ref <150)
VLDL: 40 mg/dL (ref 0–40)

## 2014-04-14 LAB — COMPREHENSIVE METABOLIC PANEL
ALT: 44 U/L (ref 0–53)
AST: 24 U/L (ref 0–37)
Albumin: 3.8 g/dL (ref 3.5–5.2)
Alkaline Phosphatase: 53 U/L (ref 39–117)
Anion gap: 14 (ref 5–15)
BUN: 18 mg/dL (ref 6–23)
CO2: 23 mEq/L (ref 19–32)
Calcium: 9 mg/dL (ref 8.4–10.5)
Chloride: 107 mEq/L (ref 96–112)
Creatinine, Ser: 0.97 mg/dL (ref 0.50–1.35)
GFR calc Af Amer: >90 mL/min (ref >90)
GFR calc non Af Amer: >90 mL/min (ref >90)
Glucose, Bld: 84 mg/dL (ref 70–99)
Potassium: 4.2 mEq/L (ref 3.7–5.3)
Sodium: 144 mEq/L (ref 137–147)
Total Bilirubin: 0.5 mg/dL (ref 0.3–1.2)
Total Protein: 6.6 g/dL (ref 6.0–8.3)

## 2014-04-14 LAB — I-STAT TROPONIN, ED
Troponin i, poc: 0.01 ng/mL (ref 0.00–0.08)
Troponin i, poc: 0.01 ng/mL (ref 0.00–0.08)

## 2014-04-14 LAB — LIPASE, BLOOD: Lipase: 27 U/L (ref 11–59)

## 2014-04-14 MED ORDER — ONDANSETRON HCL 4 MG/2ML IJ SOLN
4.0000 mg | Freq: Once | INTRAMUSCULAR | Status: AC
  Administered 2014-04-14: 4 mg via INTRAVENOUS
  Filled 2014-04-14: qty 2

## 2014-04-14 MED ORDER — HYDROMORPHONE HCL 1 MG/ML IJ SOLN
1.0000 mg | Freq: Once | INTRAMUSCULAR | Status: AC
  Administered 2014-04-14: 1 mg via INTRAVENOUS
  Filled 2014-04-14: qty 1

## 2014-04-14 MED ORDER — HYDROMORPHONE HCL 1 MG/ML IJ SOLN
1.0000 mg | Freq: Once | INTRAMUSCULAR | Status: AC
Start: 2014-04-14 — End: 2014-04-14
  Administered 2014-04-14: 1 mg via INTRAVENOUS
  Filled 2014-04-14: qty 1

## 2014-04-14 MED ORDER — HYOSCYAMINE SULFATE 0.125 MG SL SUBL
0.1250 mg | SUBLINGUAL_TABLET | Freq: Once | SUBLINGUAL | Status: AC
  Administered 2014-04-14: 0.125 mg via SUBLINGUAL
  Filled 2014-04-14: qty 1

## 2014-04-14 MED ORDER — OMEPRAZOLE 40 MG PO CPDR: 40.0000 mg | DELAYED_RELEASE_CAPSULE | Freq: Every day | ORAL | Status: DC

## 2014-04-14 MED ORDER — IOHEXOL 350 MG/ML SOLN
100.0000 mL | Freq: Once | INTRAVENOUS | Status: AC | PRN
  Administered 2014-04-14: 100 mL via INTRAVENOUS

## 2014-04-14 MED ORDER — OXYCODONE-ACETAMINOPHEN 5-325 MG PO TABS: 1.0000 | ORAL_TABLET | ORAL | Status: DC | PRN

## 2014-04-14 MED ORDER — HYOSCYAMINE SULFATE 0.125 MG PO TABS
0.1250 mg | ORAL_TABLET | Freq: Once | ORAL | Status: DC
  Filled 2014-04-14: qty 1

## 2014-04-14 MED ORDER — NITROGLYCERIN 0.3 MG SL SUBL: 0.4000 mg | SUBLINGUAL_TABLET | Freq: Once | SUBLINGUAL | Status: DC

## 2014-04-14 MED ORDER — FENTANYL CITRATE 0.05 MG/ML IJ SOLN
100.0000 ug | Freq: Once | INTRAMUSCULAR | Status: AC
  Administered 2014-04-14: 100 ug via INTRAVENOUS
  Filled 2014-04-14: qty 2

## 2014-04-14 MED ORDER — GI COCKTAIL ~~LOC~~
30.0000 mL | Freq: Once | ORAL | Status: DC
  Filled 2014-04-14: qty 30

## 2014-04-14 MED ORDER — NITROGLYCERIN 0.3 MG SL SUBL
0.4000 mg | SUBLINGUAL_TABLET | Freq: Once | SUBLINGUAL | Status: AC
  Administered 2014-04-14: 0.3 mg via SUBLINGUAL

## 2014-04-14 MED ORDER — ASPIRIN 81 MG PO CHEW
324.0000 mg | CHEWABLE_TABLET | Freq: Once | ORAL | Status: AC
  Administered 2014-04-14: 324 mg via ORAL

## 2014-04-14 NOTE — ED Notes (Signed)
Dr. Rees at bedside  

## 2014-04-14 NOTE — ED Provider Notes (Signed)
CSN: [id number]     Arrival date & time 04/14/14  1121 History   First MD Initiated Contact with Patient 04/14/14 1138     Chief Complaint  Patient presents with  . Chest Pain     The history is provided by the patient.    Patient presents with epigastric chest pain. Chest pain as stabbing and burning in nature started about 10:30 this morning when he was dropping his child off at urgent care. He denies any associated symptoms. No fevers, vomiting, leg swelling, shortness of breath. He has had similar symptoms previously but they have not been as intense or lasted this long. He has a history of low back pain, no additional medical problems. He is a smoker and denies any drug use or IV drug use.  Symptoms are severe, constant, and worsening.  Past Medical History  Diagnosis Date  . Gall stones   . Arthritis   . Chronic lower back pain    Past Surgical History  Procedure Laterality Date  . Cholecystectomy    . Knee surgery      right knee  . Esophagogastroduodenoscopy N/A 02/27/2013    Procedure: ESOPHAGOGASTRODUODENOSCOPY (EGD);  Surgeon: Florencia Reasonsobert V Buccini, MD;  Location: [facility];  Service: Endoscopy;  Laterality: N/A;  . Gallbladder surgery     Family History  Problem Relation Age of Onset  . Multiple sclerosis Mother   . Heart failure Maternal Grandfather   . Diabetes Father   . Fibromyalgia Sister   . Schizophrenia Brother   . Stroke Maternal Grandmother   . Diabetes Paternal Grandmother    History  Substance Use Topics  . Smoking status: Current Every Day Smoker -- 0.50 packs/day    Types: Cigarettes  . Smokeless tobacco: Never Used  . Alcohol Use: No    Review of Systems  All other systems reviewed and are negative.     Allergies  Hydrocodone  Home Medications   Prior to Admission medications   Medication Sig Start Date End Date Taking? Authorizing Provider  albuterol (PROVENTIL HFA;VENTOLIN HFA) 108 (90 BASE) MCG/ACT inhaler Inhale 2 puffs into the  lungs every 4 (four) hours as needed for wheezing or shortness of breath (cough, shortness of breath or wheezing.). 03/31/14  Yes Carmelina DaneJeffery S Anderson, MD  cyclobenzaprine (FLEXERIL) 5 MG tablet Take 1 tablet (5 mg total) by mouth 3 (three) times daily as needed for muscle spasms. 04/13/14  Yes Peyton Najjaravid H Hopper, MD  diclofenac (VOLTAREN) 75 MG EC tablet Take 1 tablet (75 mg total) by mouth 2 (two) times daily. 04/13/14  Yes Peyton Najjaravid H Hopper, MD   BP 111/64 mmHg  Pulse 73  Temp(Src) 98.3 F (36.8 C) (Oral)  Resp 20  Ht 5\' 9"  (1.753 m)  Wt 232 lb (105.235 kg)  BMI 34.24 kg/m2  SpO2 98% Physical Exam  Constitutional: He is oriented to person, place, and time. He appears well-developed and well-nourished.  Moderate distress  HENT:  Head: Normocephalic and atraumatic.  Cardiovascular: Normal rate.   No murmur heard. 2+ radial pulses bilaterally.  Pulmonary/Chest: Effort normal. No respiratory distress.  Abdominal: Soft.  Moderate epigastric tenderness without guarding or rebound.  Musculoskeletal: He exhibits no edema or tenderness.  Neurological: He is alert and oriented to person, place, and time.  Skin: Skin is warm and dry.  Psychiatric: He has a normal mood and affect. His behavior is normal.  Nursing note and vitals reviewed.   ED Course  Procedures (including critical care time) Labs Review  Labs Reviewed  CBC - Abnormal; Notable for the following:    Platelets 132 (*)    All other components within normal limits  COMPREHENSIVE METABOLIC PANEL  LIPASE, BLOOD  I-STAT TROPOININ, ED  Rosezena Sensor, ED    Imaging Review Dg Chest 2 View  04/14/2014   CLINICAL DATA:  38 year old male with chest pain.  EXAM: CHEST  2 VIEW  COMPARISON:  None.  FINDINGS: The cardiomediastinal silhouette is unremarkable.  Mild peribronchial thickening noted.  There is no evidence of focal airspace disease, pulmonary edema, suspicious pulmonary nodule/mass, pleural effusion, or pneumothorax. No acute  bony abnormalities are identified.  IMPRESSION: Mild peribronchial thickening -likely chronic.  No other significant abnormalities.   Electronically Signed   By: Laveda Abbe M.D.   On: 04/14/2014 11:59   Ct Angio Abdomen W/cm &/or Wo Contrast  04/14/2014   CLINICAL DATA:  38 year old male was sudden onset of epigastric pain. This is happened intermittently over the past several years and is under [place] and occurs.  EXAM: CT ANGIOGRAPHY CHEST AND ABDOMEN  TECHNIQUE: Multidetector CT imaging through the chest, abdomen was performed using the standard protocol during bolus administration of intravenous contrast. Multiplanar reconstructed images and MIPs were obtained and reviewed to evaluate the vascular anatomy.  CONTRAST:  OMNIPAQUE IOHEXOL 350 MG/ML SOLN  COMPARISON:  Prior CT scan of the abdomen and pelvis 03/10/2013  FINDINGS: CTA CHEST FINDINGS  VASCULAR  Heart/Vascular: No evidence of acute intramural hematoma on the non contrasted images. Conventional 3 vessel aortic arch anatomy. No evidence of aneurysmal dilatation, dissection or other acute abnormality. The main and central pulmonary arteries are within normal limits for size. No evidence of central PE. The heart is within normal limits for size. No pericardial effusion.  Review of the MIP images confirms the above findings.  NON VASCULAR  Mediastinum: Unremarkable CT appearance of the thyroid gland. No suspicious mediastinal or hilar adenopathy. No soft tissue mediastinal mass. The thoracic esophagus is unremarkable.  Lungs/Pleura: No pleural effusion. Trace dependent atelectasis in the lower lobes. Otherwise, the lungs are clear.  Bones/Soft Tissues: No acute fracture or aggressive appearing lytic or blastic osseous lesion.  CTA ABDOMEN FINDINGS  VASCULAR  Aorta: Normal caliber aorta without evidence of aneurysm, dissection or other acute abnormality. No significant atherosclerotic calcification or plaque.  Celiac: Widely patent.  Conventional  hepatic arterial anatomy.  SMA: Widely patent and unremarkable.  Renals: Solitary dominant renal arteries bilaterally. No evidence of fibromuscular dysplasia or other acute abnormality. Additionally, there is a small accessory renal artery providing supplying to the upper pole.  IMA: Widely patent and unremarkable.  Veins:  No focal venous abnormality.  Review of the MIP images confirms the above findings.  NON-VASCULAR  Abdomen: Unremarkable CT appearance of the stomach, duodenum, is spleen, adrenal glands and pancreas. Normal hepatic contour and morphology. No discrete hepatic lesion. The gallbladder is surgically absent. No significant intra or extrahepatic biliary ductal dilatation. Unremarkable appearance of the bilateral kidneys. No focal solid lesion, hydronephrosis or nephrolithiasis.  No evidence of obstruction or focal bowel wall thickening. Although incompletely imaged, a segment of the normal appendix is identified in the right lower quadrant.  Bones/Soft Tissues: No acute fracture or aggressive appearing lytic or blastic osseous lesion.  IMPRESSION: CTA CHEST  1. No evidence of acute arterial abnormality. 2. No acute cardiopulmonary process. CTA ABDOMEN  1. No evidence of acute arterial abnormality. 2. Surgical changes of prior cholecystectomy. No evidence of intra or extrahepatic biliary ductal dilatation.  3. No acute abnormality identified in the abdomen. Signed,  Sterling Big, MD  [facility]  [facility]   Electronically Signed   By: Malachy Moan M.D.   On: 04/14/2014 16:00   Ct Angio Chest Aorta W/cm &/or Wo/cm  04/14/2014   CLINICAL DATA:  38 year old male was sudden onset of epigastric pain. This is happened intermittently over the past several years and is under [place] and occurs.  EXAM: CT ANGIOGRAPHY CHEST AND ABDOMEN  TECHNIQUE: Multidetector CT imaging through the chest, abdomen was performed using the standard protocol during  bolus administration of intravenous contrast. Multiplanar reconstructed images and MIPs were obtained and reviewed to evaluate the vascular anatomy.  CONTRAST:  OMNIPAQUE IOHEXOL 350 MG/ML SOLN  COMPARISON:  Prior CT scan of the abdomen and pelvis 03/10/2013  FINDINGS: CTA CHEST FINDINGS  VASCULAR  Heart/Vascular: No evidence of acute intramural hematoma on the non contrasted images. Conventional 3 vessel aortic arch anatomy. No evidence of aneurysmal dilatation, dissection or other acute abnormality. The main and central pulmonary arteries are within normal limits for size. No evidence of central PE. The heart is within normal limits for size. No pericardial effusion.  Review of the MIP images confirms the above findings.  NON VASCULAR  Mediastinum: Unremarkable CT appearance of the thyroid gland. No suspicious mediastinal or hilar adenopathy. No soft tissue mediastinal mass. The thoracic esophagus is unremarkable.  Lungs/Pleura: No pleural effusion. Trace dependent atelectasis in the lower lobes. Otherwise, the lungs are clear.  Bones/Soft Tissues: No acute fracture or aggressive appearing lytic or blastic osseous lesion.  CTA ABDOMEN FINDINGS  VASCULAR  Aorta: Normal caliber aorta without evidence of aneurysm, dissection or other acute abnormality. No significant atherosclerotic calcification or plaque.  Celiac: Widely patent.  Conventional hepatic arterial anatomy.  SMA: Widely patent and unremarkable.  Renals: Solitary dominant renal arteries bilaterally. No evidence of fibromuscular dysplasia or other acute abnormality. Additionally, there is a small accessory renal artery providing supplying to the upper pole.  IMA: Widely patent and unremarkable.  Veins:  No focal venous abnormality.  Review of the MIP images confirms the above findings.  NON-VASCULAR  Abdomen: Unremarkable CT appearance of the stomach, duodenum, is spleen, adrenal glands and pancreas. Normal hepatic contour and morphology. No discrete  hepatic lesion. The gallbladder is surgically absent. No significant intra or extrahepatic biliary ductal dilatation. Unremarkable appearance of the bilateral kidneys. No focal solid lesion, hydronephrosis or nephrolithiasis.  No evidence of obstruction or focal bowel wall thickening. Although incompletely imaged, a segment of the normal appendix is identified in the right lower quadrant.  Bones/Soft Tissues: No acute fracture or aggressive appearing lytic or blastic osseous lesion.  IMPRESSION: CTA CHEST  1. No evidence of acute arterial abnormality. 2. No acute cardiopulmonary process. CTA ABDOMEN  1. No evidence of acute arterial abnormality. 2. Surgical changes of prior cholecystectomy. No evidence of intra or extrahepatic biliary ductal dilatation. 3. No acute abnormality identified in the abdomen. Signed,  Sterling Big, MD  [facility]  [facility]   Electronically Signed   By: Malachy Moan M.D.   On: 04/14/2014 16:00     EKG Interpretation   Date/Time:  Sunday April 14 2014 14:22:09 EST Ventricular Rate:  71 PR Interval:  263 QRS Duration: 127 QT Interval:  420 QTC Calculation: 456 R Axis:   126 Text Interpretation:  Sinus rhythm Ventricular premature complex Prolonged  PR interval Baseline wander in lead(s) I Confirmed  by Lincoln Brighamees, Liz [phone]([id number]) on  04/14/2014 4:37:22 PM      MDM   Final diagnoses:  Other chest pain  Acute epigastric pain    Patient presents to the emergency department for evaluation of chest pain. CTA was done to rule out dissection given patient's intense symptoms. CTA was negative for dissection. Clinical picture are not consistent with PE. Clinical picture are not consistent with ACS-symptoms are atypical and patient without risk factors for coronary artery disease. Patient received aspirin and nitroglycerin prior to ED arrival without change in symptoms. Patient declined GI cocktail. That a lot of was the  only thing that affected patient's pain, there was no change with fentanyl or Levsin. Discussed with patient unclear source of pain, question inflammation of the stomach lining will treat with omeprazole with close PCP follow-up and return precautions.   Tilden FossaElizabeth Lelon Ikard, MD 04/14/14 1725

## 2014-04-14 NOTE — ED Notes (Signed)
Patient refuse GI cocktail. [facility] with no relief and "taste nasty". RN made MD aware. Verbal order for dilaudid 1mg  IV given.

## 2014-04-14 NOTE — Progress Notes (Addendum)
Subjective:    Patient ID: Bruce Morales, male    DOB: 05-08-1976, 38 y.o.   MRN: [id number]  This chart was scribed for Ellamae Siaobert Starletta Houchin, MD by Jolene Provostobert Halas, ED Scribe. The patient was seen in RM06. The patient's care was started at 10:00 AM.  Chief Complaint  Patient presents with  . Chest Pain    HPI HPI Comments:  Bruce Morales is a 38 y.o. male smoker with a past hx of cholecystectomy (with complications) a few yrs ago who presents to [facility](family in another room for care complaining of abrupt onset of severe epigastric abdominal pain radiating to substernal area, and R jaw. Up since 3 o' clock this morning with daughter being ill. Has had this pain intermittently over past 6-8 months , without clear provocation--works sales/stressful but sedentary. He has mild diaphoresis, shortness of breath, chest pressure now. Mild nausea.  Pt states the pain reminds him of the pain he experiencing when he had cholecystitis. Pt states that he has mild abdominal pain with drinking coffee once every two weeks.  Smoker no htn Past Medical History  Diagnosis Date  . Gall stones    Current Outpatient Prescriptions on File Prior to Visit  Medication Sig Dispense Refill  . albuterol (PROVENTIL HFA;VENTOLIN HFA) 108 (90 BASE) MCG/ACT inhaler Inhale 2 puffs into the lungs every 4 (four) hours as needed for wheezing or shortness of breath (cough, shortness of breath or wheezing.). 1 Inhaler 1  . cyclobenzaprine (FLEXERIL) 5 MG tablet Take 1 tablet (5 mg total) by mouth 3 (three) times daily as needed for muscle spasms. 30 tablet 1  . diclofenac (VOLTAREN) 75 MG EC tablet Take 1 tablet (75 mg total) by mouth 2 (two) times daily. 30 tablet 0   No current facility-administered medications on file prior to visit.   Allergies  Allergen Reactions  . Hydrocodone Hives and Itching      Review of Systems  Gastrointestinal: Positive for abdominal pain (Severe).  note chronic back pain See  use of NSAIDs    Objective:  Temp(Src) 98.2 F (36.8 C) (Oral)  Resp 18  Ht 5\' 9"  (1.753 m)  Wt 232 lb (105.235 kg)  BMI 34.24 kg/m2  SpO2 94%  Physical Exam  Constitutional: He is oriented to person, place, and time. He appears well-developed and well-nourished. No distress.  HENT:  Head: Normocephalic and atraumatic.  Eyes: Conjunctivae and EOM are normal. Pupils are equal, round, and reactive to light.  Neck: Neck supple. No JVD present. No tracheal deviation present.  Cardiovascular: Normal rate, regular rhythm, normal heart sounds and intact distal pulses.  Exam reveals no gallop and no friction rub.   No murmur heard. Mildly diaphoretic  Pulmonary/Chest: Effort normal and breath sounds normal.  Hurts to deep breathe/SOB better w/ o2 No chest tend to palp  Abdominal: He exhibits no distension and no mass. There is tenderness. There is guarding. There is no rebound.  Tender to palp RUQ without rebound or mass Not distended  Musculoskeletal: Normal range of motion. He exhibits no edema.  Neurological: He is alert and oriented to person, place, and time.  Skin: Skin is warm and dry.  Psychiatric: He has a normal mood and affect. His behavior is normal.  Nursing note and vitals reviewed. Temp(Src) 98.2 F (36.8 C) (Oral)  Resp 18  Ht 5\' 9"  (1.753 m)  Wt 232 lb (105.235 kg)  BMI 34.24 kg/m2  SpO2 94% BP 130/80 EKG=PVCs/1deg block/no signs of acute injury  30cc gi cocktail=no effect   NTG x1=no effect  [facility] reading (PRIMARY) by  Dr. Catelynn Sparger=CXR--clear  IV started/ASA given       Assessment & Plan:  Chest and Abd pain-?etio-- R/o MI then can search for other causes if negative(common duct stone, ulcer, PTE etc) EMS called for emergency transport as he is too unstable to consider private transport   I have completed the patient encounter in its entirety as documented by the scribe, with editing by me where necessary. Kylin Dubs P. Merla Richesoolittle, M.D.

## 2014-04-14 NOTE — ED Notes (Signed)
Per EMS pt from [facility] dropping of child and had sudden epigastric pain radiating to jaw 10/10. Pt given 324 ASA and 4 nitro with no relief. Patient VS WNL. Denies n/v/dizziness/lightheadedness

## 2014-04-14 NOTE — Discharge Instructions (Signed)

## 2014-04-15 ENCOUNTER — Telehealth: Payer: Self-pay

## 2014-04-15 ENCOUNTER — Observation Stay (HOSPITAL_COMMUNITY)
Admission: AD | Admit: 2014-04-15 | Discharge: 2014-04-17 | Disposition: A | Discharge status: Home / Self Care | Payer: 59 | Source: Ambulatory Visit | Attending: Cardiovascular Disease | Admitting: Cardiovascular Disease

## 2014-04-15 DIAGNOSIS — I441 Atrioventricular block, second degree: Secondary | ICD-10-CM | POA: Insufficient documentation

## 2014-04-15 DIAGNOSIS — Z791 Long term (current) use of non-steroidal anti-inflammatories (NSAID): Secondary | ICD-10-CM | POA: Diagnosis not present

## 2014-04-15 DIAGNOSIS — I429 Cardiomyopathy, unspecified: Secondary | ICD-10-CM | POA: Diagnosis not present

## 2014-04-15 DIAGNOSIS — E785 Hyperlipidemia, unspecified: Secondary | ICD-10-CM | POA: Diagnosis not present

## 2014-04-15 DIAGNOSIS — E669 Obesity, unspecified: Secondary | ICD-10-CM | POA: Insufficient documentation

## 2014-04-15 DIAGNOSIS — F17200 Nicotine dependence, unspecified, uncomplicated: Secondary | ICD-10-CM | POA: Diagnosis present

## 2014-04-15 DIAGNOSIS — I498 Other specified cardiac arrhythmias: Secondary | ICD-10-CM | POA: Insufficient documentation

## 2014-04-15 DIAGNOSIS — R739 Hyperglycemia, unspecified: Secondary | ICD-10-CM | POA: Diagnosis not present

## 2014-04-15 DIAGNOSIS — I209 Angina pectoris, unspecified: Secondary | ICD-10-CM

## 2014-04-15 DIAGNOSIS — R1013 Epigastric pain: Secondary | ICD-10-CM | POA: Insufficient documentation

## 2014-04-15 DIAGNOSIS — M545 Low back pain, unspecified: Secondary | ICD-10-CM | POA: Diagnosis present

## 2014-04-15 DIAGNOSIS — R079 Chest pain, unspecified: Principal | ICD-10-CM | POA: Diagnosis present

## 2014-04-15 DIAGNOSIS — IMO0001 Reserved for inherently not codable concepts without codable children: Secondary | ICD-10-CM | POA: Diagnosis present

## 2014-04-15 DIAGNOSIS — D696 Thrombocytopenia, unspecified: Secondary | ICD-10-CM | POA: Insufficient documentation

## 2014-04-15 DIAGNOSIS — F17210 Nicotine dependence, cigarettes, uncomplicated: Secondary | ICD-10-CM | POA: Insufficient documentation

## 2014-04-15 DIAGNOSIS — R52 Pain, unspecified: Secondary | ICD-10-CM

## 2014-04-15 DIAGNOSIS — F419 Anxiety disorder, unspecified: Secondary | ICD-10-CM | POA: Diagnosis not present

## 2014-04-15 DIAGNOSIS — Z79891 Long term (current) use of opiate analgesic: Secondary | ICD-10-CM | POA: Insufficient documentation

## 2014-04-15 HISTORY — DX: Unspecified chronic bronchitis: J42

## 2014-04-15 HISTORY — DX: Angina pectoris, unspecified: I20.9

## 2014-04-15 LAB — H. PYLORI ANTIBODY, IGG: H Pylori IgG: <0.4 {ISR}

## 2014-04-15 LAB — TROPONIN I: Troponin I: <0.3 ng/mL (ref <0.30)

## 2014-04-15 MED ORDER — ASPIRIN EC 81 MG PO TBEC
81.0000 mg | DELAYED_RELEASE_TABLET | Freq: Every day | ORAL | Status: DC
  Administered 2014-04-16: 81 mg via ORAL
  Filled 2014-04-15 (×2): qty 1

## 2014-04-15 MED ORDER — HYDROMORPHONE HCL 1 MG/ML IJ SOLN
1.0000 mg | Freq: Four times a day (QID) | INTRAMUSCULAR | Status: DC | PRN
  Administered 2014-04-16 – 2014-04-17 (×4): 1 mg via INTRAVENOUS
  Filled 2014-04-15 (×4): qty 1

## 2014-04-15 MED ORDER — ALUM & MAG HYDROXIDE-SIMETH 200-200-20 MG/5ML PO SUSP
30.0000 mL | Freq: Once | ORAL | Status: AC
  Administered 2014-04-15: 30 mL via ORAL
  Filled 2014-04-15: qty 30

## 2014-04-15 MED ORDER — SODIUM CHLORIDE 0.9 % IJ SOLN: 3.0000 mL | INTRAMUSCULAR | Status: DC | PRN

## 2014-04-15 MED ORDER — NITROGLYCERIN 0.4 MG SL SUBL
0.4000 mg | SUBLINGUAL_TABLET | SUBLINGUAL | Status: DC | PRN
  Administered 2014-04-15 – 2014-04-16 (×5): 0.4 mg via SUBLINGUAL
  Filled 2014-04-15 (×6): qty 1

## 2014-04-15 MED ORDER — ACETAMINOPHEN 325 MG PO TABS
650.0000 mg | ORAL_TABLET | ORAL | Status: DC | PRN
  Administered 2014-04-15 – 2014-04-16 (×3): 650 mg via ORAL
  Filled 2014-04-15 (×3): qty 2

## 2014-04-15 MED ORDER — ASPIRIN 300 MG RE SUPP
300.0000 mg | RECTAL | Status: AC
  Filled 2014-04-15: qty 1

## 2014-04-15 MED ORDER — METOPROLOL TARTRATE 25 MG PO TABS
25.0000 mg | ORAL_TABLET | Freq: Two times a day (BID) | ORAL | Status: DC
  Administered 2014-04-15 – 2014-04-16 (×3): 25 mg via ORAL
  Filled 2014-04-15 (×5): qty 1

## 2014-04-15 MED ORDER — HEPARIN BOLUS VIA INFUSION
4000.0000 [IU] | Freq: Once | INTRAVENOUS | Status: AC
  Administered 2014-04-15: 4000 [IU] via INTRAVENOUS
  Filled 2014-04-15: qty 4000

## 2014-04-15 MED ORDER — SODIUM CHLORIDE 0.9 % IJ SOLN: 3.0000 mL | Freq: Two times a day (BID) | INTRAMUSCULAR | Status: DC

## 2014-04-15 MED ORDER — SODIUM CHLORIDE 0.9 % IV SOLN: 250.0000 mL | INTRAVENOUS | Status: DC | PRN

## 2014-04-15 MED ORDER — HYDROMORPHONE HCL 1 MG/ML IJ SOLN
1.0000 mg | Freq: Once | INTRAMUSCULAR | Status: AC
Start: 2014-04-15 — End: 2014-04-15
  Administered 2014-04-15: 1 mg via INTRAVENOUS
  Filled 2014-04-15: qty 1

## 2014-04-15 MED ORDER — HEPARIN (PORCINE) IN NACL 100-0.45 UNIT/ML-% IJ SOLN
1600.0000 [IU]/h | INTRAMUSCULAR | Status: DC
  Administered 2014-04-15: 1250 [IU]/h via INTRAVENOUS
  Administered 2014-04-17: 1600 [IU]/h via INTRAVENOUS
  Filled 2014-04-15 (×5): qty 250

## 2014-04-15 MED ORDER — ONDANSETRON HCL 4 MG/2ML IJ SOLN: 4.0000 mg | Freq: Four times a day (QID) | INTRAMUSCULAR | Status: DC | PRN

## 2014-04-15 MED ORDER — ATORVASTATIN CALCIUM 40 MG PO TABS
40.0000 mg | ORAL_TABLET | Freq: Every day | ORAL | Status: DC
  Administered 2014-04-15 – 2014-04-16 (×2): 40 mg via ORAL
  Filled 2014-04-15 (×3): qty 1

## 2014-04-15 MED ORDER — ASPIRIN 81 MG PO CHEW
324.0000 mg | CHEWABLE_TABLET | ORAL | Status: AC
  Filled 2014-04-15: qty 4

## 2014-04-15 NOTE — Progress Notes (Addendum)
Pt complained of epigastric mid sternal pain ekg normal, called Dr. Algie CofferKadakia made him aware that pt states that dilaudid worked for him last night in the ER. No new orders given,pt has tylenol and nitro sl. Monitoring will continue.

## 2014-04-15 NOTE — Progress Notes (Addendum)
2053 Pt c/o 10/10 Cp, burning in epigastric region and radiating up through mid chest.  EKG completed, VSS, pain medication administered with relief.  Will continue to monitor closely.  Pt resting in bed with wife at bedside.  Call bell in reach.    2335  Pt c/o 7/10 CP, burning in epigastric region radiation through chest.  MD notified.  Dilaudid changed to q 6 PRN (given at 2040).  PRN tylenol given.  Standing order Maalox ordered and given, 3 SL nitro given bringing pain down to 4/10.    0030 CMT notified RN, patient having 2nd degree type 2 beats/pauses then returns to NSR.  MD on call notified.  Ordered to hold Dilaudid due to EKG changes. Also, Protonix 40mg  daily, and GI cocktail once ordered.  Will carry out orders and monitor pt closely.  Pt resting in bed with call bell in reach.  0410 Pt c/o 7/10 CP.  PRN Tylenol given.  EKG complete.  VSS.  1 nitro administered with no relief. RR to assess pt.  MD notified.  1x order for 0.5 mg Xanex.  Will carry out orders. Pt resting in bed with call bell in reach.

## 2014-04-15 NOTE — H&P (Signed)
Referring Physician:  [name] is an 38 y.o. male.                       Chief Complaint: Chest pain  HPI: 38 year old male has recurrent epigastric, both pectoral area chest pain radiating to neck and jaws x 2-3 years but lately worsening. Dizzy feeling with Diclofenac and cyclobenzaprine use and itching with hydrocodone and Oxycodone use. No fever, cough or shortness of breath. Recent ER work-up was unremarkable.   Past Medical History  Diagnosis Date  . Gall stones   . Arthritis   . Chronic lower back pain       Past Surgical History  Procedure Laterality Date  . Cholecystectomy    . Knee surgery      right knee  . Esophagogastroduodenoscopy N/A 02/27/2013    Procedure: ESOPHAGOGASTRODUODENOSCOPY (EGD);  Surgeon: Cleotis Nipper, MD;  Location: [facility];  Service: Endoscopy;  Laterality: N/A;  . Gallbladder surgery      Family History  Problem Relation Age of Onset  . Multiple sclerosis Mother   . Heart failure Maternal Grandfather   . Diabetes Father   . Fibromyalgia Sister   . Schizophrenia Brother   . Stroke Maternal Grandmother   . Diabetes Paternal Grandmother    Social History:  reports that he has been smoking Cigarettes.  He has been smoking about 0.50 packs per day. He has never used smokeless tobacco. He reports that he does not drink alcohol or use illicit drugs.  Allergies:  Allergies  Allergen Reactions  . Hydrocodone Hives and Itching  . Oxycodone     Itching   . Percocet [Oxycodone-Acetaminophen]     Itching     Medications Prior to Admission  Medication Sig Dispense Refill  . albuterol (PROVENTIL HFA;VENTOLIN HFA) 108 (90 BASE) MCG/ACT inhaler Inhale 2 puffs into the lungs every 4 (four) hours as needed for wheezing or shortness of breath (cough, shortness of breath or wheezing.). 1 Inhaler 1  . cyclobenzaprine (FLEXERIL) 5 MG tablet Take 1 tablet (5 mg total) by mouth 3 (three) times daily as needed for muscle spasms. 30 tablet 1  .  diclofenac (VOLTAREN) 75 MG EC tablet Take 1 tablet (75 mg total) by mouth 2 (two) times daily. 30 tablet 0  . omeprazole (PRILOSEC) 40 MG capsule Take 1 capsule (40 mg total) by mouth daily. 30 capsule 0  . oxyCODONE-acetaminophen (PERCOCET/ROXICET) 5-325 MG per tablet Take 1 tablet by mouth every 4 (four) hours as needed for moderate pain or severe pain. (Patient not taking: Reported on 04/15/2014) 10 tablet 0    Results for orders placed or performed during the hospital encounter of 04/14/14 (from the past 48 hour(s))  CBC     Status: Abnormal   Collection Time: 04/14/14 11:36 AM  Result Value Ref Range   WBC 7.5 4.0 - 10.5 K/uL   RBC 4.61 4.22 - 5.81 MIL/uL   Hemoglobin 13.9 13.0 - 17.0 g/dL   HCT 40.4 39.0 - 52.0 %   MCV 87.6 78.0 - 100.0 fL   MCH 30.2 26.0 - 34.0 pg   MCHC 34.4 30.0 - 36.0 g/dL   RDW 12.3 11.5 - 15.5 %   Platelets 132 (L) 150 - 400 K/uL  Comprehensive metabolic panel     Status: None   Collection Time: 04/14/14 11:36 AM  Result Value Ref Range   Sodium 144 137 - 147 mEq/L   Potassium 4.2 3.7 - 5.3  mEq/L   Chloride 107 96 - 112 mEq/L   CO2 23 19 - 32 mEq/L   Glucose, Bld 84 70 - 99 mg/dL   BUN 18 6 - 23 mg/dL   Creatinine, Ser 0.97 0.50 - 1.35 mg/dL   Calcium 9.0 8.4 - 10.5 mg/dL   Total Protein 6.6 6.0 - 8.3 g/dL   Albumin 3.8 3.5 - 5.2 g/dL   AST 24 0 - 37 U/L   ALT 44 0 - 53 U/L   Alkaline Phosphatase 53 39 - 117 U/L   Total Bilirubin 0.5 0.3 - 1.2 mg/dL   GFR calc non Af Amer >90 >90 mL/min   GFR calc Af Amer >90 >90 mL/min    Comment: (NOTE) The eGFR has been calculated using the CKD EPI equation. This calculation has not been validated in all clinical situations. eGFR's persistently <90 mL/min signify possible Chronic Kidney Disease.    Anion gap 14 5 - 15  Lipase, blood     Status: None   Collection Time: 04/14/14 11:36 AM  Result Value Ref Range   Lipase 27 11 - 59 U/L  I-stat troponin, ED (not at [facility])     Status: None   Collection Time:  04/14/14 11:40 AM  Result Value Ref Range   Troponin i, poc 0.01 0.00 - 0.08 ng/mL   Comment 3            Comment: Due to the release kinetics of cTnI, a negative result within the first hours of the onset of symptoms does not rule out myocardial infarction with certainty. If myocardial infarction is still suspected, repeat the test at appropriate intervals.   I-stat troponin, ED     Status: None   Collection Time: 04/14/14  3:22 PM  Result Value Ref Range   Troponin i, poc 0.01 0.00 - 0.08 ng/mL   Comment 3            Comment: Due to the release kinetics of cTnI, a negative result within the first hours of the onset of symptoms does not rule out myocardial infarction with certainty. If myocardial infarction is still suspected, repeat the test at appropriate intervals.    Dg Chest 2 View  04/14/2014   CLINICAL DATA:  38 year old male with chest pain.  EXAM: CHEST  2 VIEW  COMPARISON:  None.  FINDINGS: The cardiomediastinal silhouette is unremarkable.  Mild peribronchial thickening noted.  There is no evidence of focal airspace disease, pulmonary edema, suspicious pulmonary nodule/mass, pleural effusion, or pneumothorax. No acute bony abnormalities are identified.  IMPRESSION: Mild peribronchial thickening -likely chronic.  No other significant abnormalities.   Electronically Signed   By: Hassan Rowan M.D.   On: 04/14/2014 11:59   Ct Angio Abdomen W/cm &/or Wo Contrast  04/14/2014   CLINICAL DATA:  38 year old male was sudden onset of epigastric pain. This is happened intermittently over the past several years and is under [place] and occurs.  EXAM: CT ANGIOGRAPHY CHEST AND ABDOMEN  TECHNIQUE: Multidetector CT imaging through the chest, abdomen was performed using the standard protocol during bolus administration of intravenous contrast. Multiplanar reconstructed images and MIPs were obtained and reviewed to evaluate the vascular anatomy.  CONTRAST:  160m OMNIPAQUE IOHEXOL 350 MG/ML SOLN   COMPARISON:  Prior CT scan of the abdomen and pelvis 03/10/2013  FINDINGS: CTA CHEST FINDINGS  VASCULAR  Heart/Vascular: No evidence of acute intramural hematoma on the non contrasted images. Conventional 3 vessel aortic arch anatomy. No evidence of aneurysmal dilatation,  dissection or other acute abnormality. The main and central pulmonary arteries are within normal limits for size. No evidence of central PE. The heart is within normal limits for size. No pericardial effusion.  Review of the MIP images confirms the above findings.  NON VASCULAR  Mediastinum: Unremarkable CT appearance of the thyroid gland. No suspicious mediastinal or hilar adenopathy. No soft tissue mediastinal mass. The thoracic esophagus is unremarkable.  Lungs/Pleura: No pleural effusion. Trace dependent atelectasis in the lower lobes. Otherwise, the lungs are clear.  Bones/Soft Tissues: No acute fracture or aggressive appearing lytic or blastic osseous lesion.  CTA ABDOMEN FINDINGS  VASCULAR  Aorta: Normal caliber aorta without evidence of aneurysm, dissection or other acute abnormality. No significant atherosclerotic calcification or plaque.  Celiac: Widely patent.  Conventional hepatic arterial anatomy.  SMA: Widely patent and unremarkable.  Renals: Solitary dominant renal arteries bilaterally. No evidence of fibromuscular dysplasia or other acute abnormality. Additionally, there is a small accessory renal artery providing supplying to the upper pole.  IMA: Widely patent and unremarkable.  Veins:  No focal venous abnormality.  Review of the MIP images confirms the above findings.  NON-VASCULAR  Abdomen: Unremarkable CT appearance of the stomach, duodenum, is spleen, adrenal glands and pancreas. Normal hepatic contour and morphology. No discrete hepatic lesion. The gallbladder is surgically absent. No significant intra or extrahepatic biliary ductal dilatation. Unremarkable appearance of the bilateral kidneys. No focal solid lesion,  hydronephrosis or nephrolithiasis.  No evidence of obstruction or focal bowel wall thickening. Although incompletely imaged, a segment of the normal appendix is identified in the right lower quadrant.  Bones/Soft Tissues: No acute fracture or aggressive appearing lytic or blastic osseous lesion.  IMPRESSION: CTA CHEST  1. No evidence of acute arterial abnormality. 2. No acute cardiopulmonary process. CTA ABDOMEN  1. No evidence of acute arterial abnormality. 2. Surgical changes of prior cholecystectomy. No evidence of intra or extrahepatic biliary ductal dilatation. 3. No acute abnormality identified in the abdomen. Signed,  Criselda Peaches, MD  [facility]  [facility]   Electronically Signed   By: Jacqulynn Cadet M.D.   On: 04/14/2014 16:00   Ct Angio Chest Aorta W/cm &/or Wo/cm  04/14/2014   CLINICAL DATA:  38 year old male was sudden onset of epigastric pain. This is happened intermittently over the past several years and is under [place] and occurs.  EXAM: CT ANGIOGRAPHY CHEST AND ABDOMEN  TECHNIQUE: Multidetector CT imaging through the chest, abdomen was performed using the standard protocol during bolus administration of intravenous contrast. Multiplanar reconstructed images and MIPs were obtained and reviewed to evaluate the vascular anatomy.  CONTRAST:  125m OMNIPAQUE IOHEXOL 350 MG/ML SOLN  COMPARISON:  Prior CT scan of the abdomen and pelvis 03/10/2013  FINDINGS: CTA CHEST FINDINGS  VASCULAR  Heart/Vascular: No evidence of acute intramural hematoma on the non contrasted images. Conventional 3 vessel aortic arch anatomy. No evidence of aneurysmal dilatation, dissection or other acute abnormality. The main and central pulmonary arteries are within normal limits for size. No evidence of central PE. The heart is within normal limits for size. No pericardial effusion.  Review of the MIP images confirms the above findings.  NON VASCULAR  Mediastinum:  Unremarkable CT appearance of the thyroid gland. No suspicious mediastinal or hilar adenopathy. No soft tissue mediastinal mass. The thoracic esophagus is unremarkable.  Lungs/Pleura: No pleural effusion. Trace dependent atelectasis in the lower lobes. Otherwise, the lungs are clear.  Bones/Soft Tissues: No acute fracture or aggressive  appearing lytic or blastic osseous lesion.  CTA ABDOMEN FINDINGS  VASCULAR  Aorta: Normal caliber aorta without evidence of aneurysm, dissection or other acute abnormality. No significant atherosclerotic calcification or plaque.  Celiac: Widely patent.  Conventional hepatic arterial anatomy.  SMA: Widely patent and unremarkable.  Renals: Solitary dominant renal arteries bilaterally. No evidence of fibromuscular dysplasia or other acute abnormality. Additionally, there is a small accessory renal artery providing supplying to the upper pole.  IMA: Widely patent and unremarkable.  Veins:  No focal venous abnormality.  Review of the MIP images confirms the above findings.  NON-VASCULAR  Abdomen: Unremarkable CT appearance of the stomach, duodenum, is spleen, adrenal glands and pancreas. Normal hepatic contour and morphology. No discrete hepatic lesion. The gallbladder is surgically absent. No significant intra or extrahepatic biliary ductal dilatation. Unremarkable appearance of the bilateral kidneys. No focal solid lesion, hydronephrosis or nephrolithiasis.  No evidence of obstruction or focal bowel wall thickening. Although incompletely imaged, a segment of the normal appendix is identified in the right lower quadrant.  Bones/Soft Tissues: No acute fracture or aggressive appearing lytic or blastic osseous lesion.  IMPRESSION: CTA CHEST  1. No evidence of acute arterial abnormality. 2. No acute cardiopulmonary process. CTA ABDOMEN  1. No evidence of acute arterial abnormality. 2. Surgical changes of prior cholecystectomy. No evidence of intra or extrahepatic biliary ductal dilatation. 3.  No acute abnormality identified in the abdomen. Signed,  Criselda Peaches, MD  [facility]  [facility]   Electronically Signed   By: Jacqulynn Cadet M.D.   On: 04/14/2014 16:00    Review Of Systems Constitutional: Negative for fever, activity change and appetite change.  HENT: Negative for congestion and rhinorrhea.  Eyes: Negative for discharge and itching.  Respiratory: Positive for cough, negative for shortness of breath and wheezing.  Cardiovascular: Positive for chest pain. Negative for claudication and syncope.  Gastrointestinal: Negative for nausea, vomiting, abdominal pain, diarrhea and constipation.  Genitourinary: Negative for hematuria, decreased urine volume and difficulty urinating.  Musculoskeletal: Positive for back pain.  Skin: Negative for rash and wound.  Neurological: Negative for syncope, weakness and numbness.  Blood pressure 139/78, pulse 82, temperature 98.1 F (36.7 C), temperature source Oral, resp. rate 18, weight 105.235 kg (232 lb), SpO2 100 %.  General: Obese WM is averagely built and well nourished. HEENT: Blue eyes, PERRLA, EOMI, Conj-pink, Sclera-non-icteric Neck: No JVD, Supple Heart: RRR, II/VI systolic murmur. Lungs: Clear, breath sounds, bilateral  Abdomen: obese, soft, non tender Extremities: No edema, c yanosis or clubbing. Skin: warm and dry Neuro: alert, non focal. Moves all 4 extremities.  Assessment/Plan Chest pain at rest Tobacco use disorder Obesity Anxiety  Nuclear stress test or cardiac cath in AM.  Birdie Riddle, MD  04/15/2014, 5:42 PM

## 2014-04-15 NOTE — Telephone Encounter (Signed)
Patient has found a specialist and they are going to admit him to hospital

## 2014-04-15 NOTE — Telephone Encounter (Signed)
PT SEEN YESTERDAY @ [facility] AND WAS REFERRED TO ER. STATES ER STATES EVERYTHING OK

## 2014-04-15 NOTE — Progress Notes (Signed)
ANTICOAGULATION CONSULT NOTE - Initial Consult  Pharmacy Consult for Heparin Indication: chest pain/ACS  Allergies  Allergen Reactions  . Hydrocodone Hives and Itching  . Oxycodone     Itching   . Percocet [Oxycodone-Acetaminophen]     Itching     Patient Measurements:  Weight 105.2 kg on 04/14/14 Height 5\' 9"  (1.72319m) on 04/14/14 Ideal Body Weight - 70.7 kg Heparin Dosing Weight: 93.4 kg  Vital Signs: Temp: 98.1 F (36.7 C) (11/16 1638) Temp Source: Oral (11/16 1638) BP: 139/78 mmHg (11/16 1638) Pulse Rate: 82 (11/16 1638)  Labs:  Recent Labs  04/14/14 1016 04/14/14 1022 04/14/14 1136  HGB  --  14.6 13.9  HCT  --  43.5 40.4  PLT  --   --  132*  CREATININE 0.95  --  0.97    Estimated Creatinine Clearance: 125.8 mL/min (by C-G formula based on Cr of 0.97).   Medical History: Past Medical History  Diagnosis Date  . Gall stones   . Arthritis   . Chronic lower back pain     Medications:  Prescriptions prior to admission  Medication Sig Dispense Refill Last Dose  . albuterol (PROVENTIL HFA;VENTOLIN HFA) 108 (90 BASE) MCG/ACT inhaler Inhale 2 puffs into the lungs every 4 (four) hours as needed for wheezing or shortness of breath (cough, shortness of breath or wheezing.). 1 Inhaler 1 Past Week at Unknown time  . cyclobenzaprine (FLEXERIL) 5 MG tablet Take 1 tablet (5 mg total) by mouth 3 (three) times daily as needed for muscle spasms. 30 tablet 1 Past Week at Unknown time  . diclofenac (VOLTAREN) 75 MG EC tablet Take 1 tablet (75 mg total) by mouth 2 (two) times daily. 30 tablet 0 Past Week at Unknown time  . omeprazole (PRILOSEC) 40 MG capsule Take 1 capsule (40 mg total) by mouth daily. 30 capsule 0 04/15/2014 at Unknown time  . oxyCODONE-acetaminophen (PERCOCET/ROXICET) 5-325 MG per tablet Take 1 tablet by mouth every 4 (four) hours as needed for moderate pain or severe pain. (Patient not taking: Reported on 04/15/2014) 10 tablet 0     Assessment: 38 YOM  with complaints of epigastric mid sternal pain x2 nights in ED admitted 04/15/14 for ACS to start IV heparin per pharmacy dosing. Patient was not on anticoagulants prior to admission. H/H is within normal limits. Platelets were low on 04/14/14 at 132 - no bleeding noted. SCr was 0.97 with CrCl >100 mL/min.   Goal of Therapy:  Heparin level 0.3-0.7 units/ml Monitor platelets by anticoagulation protocol: Yes   Plan:  1. Heparin bolus of 4000 units x1. 2. Heparin drip at 1250 units/hr.  3. Heparin level in 6 hours. 4. Daily heparin level and CBC while on therapy.  5. Will monitor platelets closely as low on admission.   Link SnufferJessica Garan Frappier, PharmD, BCPS Clinical Pharmacist [phone] 04/15/2014,5:37 PM

## 2014-04-15 NOTE — Telephone Encounter (Signed)
FYI

## 2014-04-15 NOTE — Progress Notes (Signed)
On assessment, patient c/o 3/10 epigastric pain radiating up his chest and into his jaw. and very upset about his pain.  Pt states nitro has not helped the pain at all and is very upset they cannot figure out what is going on with him.  Reassured pt and MD notified.  New orders received for 1x dose of 1mg  Dilaudid.  Will carry out orders and continue to monitor closely.

## 2014-04-15 NOTE — Plan of Care (Signed)
Problem: Phase I Progression Outcomes Goal: Aspirin unless contraindicated Outcome: Completed/Met Date Met:  04/15/14 GIVEN

## 2014-04-15 NOTE — Telephone Encounter (Signed)
PT STATES WAS SEEN  @ [facility] YESTERDAY AND REFERRED TO ER, ADVISED EVERYTHING OK AND WAS RELEASED. PT STATES STILL HAVE PAINS AND WANTS A REFERRAL TO A SPECIALIST

## 2014-04-15 NOTE — Telephone Encounter (Signed)
No mention of specialist referral in OV. Called to LM advise pt to RTC for further follow up to determine next step.

## 2014-04-16 ENCOUNTER — Encounter (HOSPITAL_COMMUNITY): Payer: Self-pay | Admitting: General Practice

## 2014-04-16 ENCOUNTER — Observation Stay (HOSPITAL_COMMUNITY): Payer: 59

## 2014-04-16 DIAGNOSIS — R079 Chest pain, unspecified: Secondary | ICD-10-CM | POA: Diagnosis not present

## 2014-04-16 DIAGNOSIS — F17210 Nicotine dependence, cigarettes, uncomplicated: Secondary | ICD-10-CM | POA: Diagnosis not present

## 2014-04-16 DIAGNOSIS — F419 Anxiety disorder, unspecified: Secondary | ICD-10-CM | POA: Diagnosis not present

## 2014-04-16 DIAGNOSIS — R1013 Epigastric pain: Secondary | ICD-10-CM | POA: Diagnosis not present

## 2014-04-16 LAB — BASIC METABOLIC PANEL
Anion gap: 14 (ref 5–15)
BUN: 16 mg/dL (ref 6–23)
CO2: 22 mEq/L (ref 19–32)
Calcium: 8.7 mg/dL (ref 8.4–10.5)
Chloride: 107 mEq/L (ref 96–112)
Creatinine, Ser: 0.86 mg/dL (ref 0.50–1.35)
GFR calc Af Amer: >90 mL/min (ref >90)
GFR calc non Af Amer: >90 mL/min (ref >90)
Glucose, Bld: 118 mg/dL — ABNORMAL HIGH (ref 70–99)
Potassium: 4.2 mEq/L (ref 3.7–5.3)
Sodium: 143 mEq/L (ref 137–147)

## 2014-04-16 LAB — CBC
HCT: 38.9 % — ABNORMAL LOW (ref 39.0–52.0)
Hemoglobin: 13.3 g/dL (ref 13.0–17.0)
MCH: 29.3 pg (ref 26.0–34.0)
MCHC: 34.2 g/dL (ref 30.0–36.0)
MCV: 85.7 fL (ref 78.0–100.0)
Platelets: 131 10*3/uL — ABNORMAL LOW (ref 150–400)
RBC: 4.54 MIL/uL (ref 4.22–5.81)
RDW: 12.4 % (ref 11.5–15.5)
WBC: 7.1 10*3/uL (ref 4.0–10.5)

## 2014-04-16 LAB — HEPARIN LEVEL (UNFRACTIONATED)
Heparin Unfractionated: <0.1 IU/mL — ABNORMAL LOW (ref 0.30–0.70)
Heparin Unfractionated: 0.43 IU/mL (ref 0.30–0.70)

## 2014-04-16 LAB — PROTIME-INR
INR: 1.06 (ref 0.00–1.49)
Prothrombin Time: 13.9 seconds (ref 11.6–15.2)

## 2014-04-16 LAB — TROPONIN I
Troponin I: <0.3 ng/mL (ref <0.30)
Troponin I: <0.3 ng/mL (ref <0.30)

## 2014-04-16 LAB — LIPID PANEL
Cholesterol: 193 mg/dL (ref 0–200)
HDL: 30 mg/dL — ABNORMAL LOW (ref >39)
LDL Cholesterol: 112 mg/dL — ABNORMAL HIGH (ref 0–99)
Total CHOL/HDL Ratio: 6.4 RATIO
Triglycerides: 257 mg/dL — ABNORMAL HIGH (ref <150)
VLDL: 51 mg/dL — ABNORMAL HIGH (ref 0–40)

## 2014-04-16 MED ORDER — HEPARIN BOLUS VIA INFUSION
2000.0000 [IU] | Freq: Once | INTRAVENOUS | Status: AC
  Administered 2014-04-16: 2000 [IU] via INTRAVENOUS
  Filled 2014-04-16: qty 2000

## 2014-04-16 MED ORDER — ALPRAZOLAM 0.5 MG PO TABS
0.5000 mg | ORAL_TABLET | Freq: Once | ORAL | Status: AC
  Administered 2014-04-16: 0.5 mg via ORAL
  Filled 2014-04-16: qty 1

## 2014-04-16 MED ORDER — PANTOPRAZOLE SODIUM 40 MG PO TBEC
40.0000 mg | DELAYED_RELEASE_TABLET | Freq: Every day | ORAL | Status: DC
  Administered 2014-04-16: 40 mg via ORAL
  Filled 2014-04-16: qty 1

## 2014-04-16 MED ORDER — REGADENOSON 0.4 MG/5ML IV SOLN
INTRAVENOUS | Status: AC
  Administered 2014-04-16: 0.4 mg via INTRAVENOUS
  Filled 2014-04-16: qty 5

## 2014-04-16 MED ORDER — ALPRAZOLAM 0.5 MG PO TABS
0.5000 mg | ORAL_TABLET | Freq: Every day | ORAL | Status: DC | PRN
  Administered 2014-04-16 – 2014-04-17 (×2): 0.5 mg via ORAL
  Filled 2014-04-16 (×2): qty 1

## 2014-04-16 MED ORDER — REGADENOSON 0.4 MG/5ML IV SOLN
0.4000 mg | Freq: Once | INTRAVENOUS | Status: AC
  Administered 2014-04-16: 0.4 mg via INTRAVENOUS
  Filled 2014-04-16: qty 5

## 2014-04-16 MED ORDER — TECHNETIUM TC 99M SESTAMIBI GENERIC - CARDIOLITE
30.0000 | Freq: Once | INTRAVENOUS | Status: AC | PRN
  Administered 2014-04-16: 30 via INTRAVENOUS

## 2014-04-16 MED ORDER — TECHNETIUM TC 99M SESTAMIBI GENERIC - CARDIOLITE
10.0000 | Freq: Once | INTRAVENOUS | Status: AC | PRN
  Administered 2014-04-16: 10 via INTRAVENOUS

## 2014-04-16 MED ORDER — GI COCKTAIL ~~LOC~~
30.0000 mL | Freq: Once | ORAL | Status: AC
  Administered 2014-04-16: 30 mL via ORAL
  Filled 2014-04-16: qty 30

## 2014-04-16 MED ORDER — SODIUM CHLORIDE 0.9 % IV SOLN
1.0000 mL/kg/h | INTRAVENOUS | Status: DC
  Administered 2014-04-16: 1 mL/kg/h via INTRAVENOUS

## 2014-04-16 NOTE — Plan of Care (Signed)
Problem: Phase I Progression Outcomes Goal: Voiding-avoid urinary catheter unless indicated Outcome: Completed/Met Date Met:  04/16/14     

## 2014-04-16 NOTE — Plan of Care (Signed)
Problem: Phase I Progression Outcomes Goal: MD aware of Cardiac Marker results Outcome: Completed/Met Date Met:  04/16/14 Goal: Voiding-avoid urinary catheter unless indicated Outcome: Completed/Met Date Met:  04/16/14 Goal: Other Phase I Outcomes/Goals Outcome: Completed/Met Date Met:  04/16/14  Problem: Phase II Progression Outcomes Goal: Hemodynamically stable Outcome: Completed/Met Date Met:  04/16/14 Goal: Stress Test if indicated Outcome: Completed/Met Date Met:  04/16/14 Goal: Cath/PCI Day Path if indicated Outcome: Not Applicable Date Met:  01/59/96 Goal: CV Risk Factors identified Outcome: Completed/Met Date Met:  04/16/14

## 2014-04-16 NOTE — Progress Notes (Signed)
Called by primary RN to see pt for c/o CP.  Dr. Algie CofferKadakia is aware of pt's CP and treatment options.  The pt's pain is reproducible with palpation.  Primary RN to contact MD to update him.

## 2014-04-16 NOTE — Progress Notes (Signed)
Pt went for a walk off unit after being educated not to leave unit.  Pt returned to room, smells of smoke.  Pt admitted to smoking outside.  Pt reeducated in room about importance of staying on unit.  RN will continue to monitor.

## 2014-04-16 NOTE — Plan of Care (Signed)
Problem: Phase I Progression Outcomes Goal: Hemodynamically stable Outcome: Completed/Met Date Met:  04/16/14     

## 2014-04-16 NOTE — Progress Notes (Signed)
  Echocardiogram 2D Echocardiogram has been performed.  Arvil ChacoFoster, Antoinett Dorman 04/16/2014, 10:38 AM

## 2014-04-16 NOTE — Progress Notes (Signed)
ANTICOAGULATION CONSULT NOTE Pharmacy Consult for Heparin Indication: chest pain/ACS  Allergies  Allergen Reactions  . Hydrocodone Hives and Itching  . Oxycodone     Itching   . Percocet [Oxycodone-Acetaminophen]     Itching     Patient Measurements: Weight: 232 lb (105.235 kg)  Heparin Dosing Weight: 93.4 kg  Vital Signs: Temp: 98.4 F (36.9 C) (11/16 2038) Temp Source: Oral (11/16 2038) BP: 107/66 mmHg (11/17 0006) Pulse Rate: 70 (11/17 0006)  Labs:  Recent Labs  04/14/14 1016 04/14/14 1022 04/14/14 1136 04/15/14 1954 04/16/14 0024  HGB  --  14.6 13.9  --   --   HCT  --  43.5 40.4  --   --   PLT  --   --  132*  --   --   HEPARINUNFRC  --   --   --   --  <0.10*  CREATININE 0.95  --  0.97  --   --   TROPONINI  --   --   --  <0.30  --     Estimated Creatinine Clearance: 125.8 mL/min (by C-G formula based on Cr of 0.97).  Assessment: 38 y.o. male with chest pain for heparin  Goal of Therapy:  Heparin level 0.3-0.7 units/ml Monitor platelets by anticoagulation protocol: Yes   Plan:  Heparin 2000 units IV bolus, then increase heparin 1600 units/hr Check heparin level in 6 hours.  Geannie RisenGreg Yago Ludvigsen, PharmD, BCPS   04/16/2014,1:26 AM

## 2014-04-16 NOTE — Progress Notes (Signed)
ANTICOAGULATION CONSULT NOTE - Follow Up Consult  Pharmacy Consult for Heparin Indication: chest pain/ACS  Allergies  Allergen Reactions  . Hydrocodone Hives and Itching  . Oxycodone     Itching   . Percocet [Oxycodone-Acetaminophen]     Itching     Patient Measurements: Weight: 229 lb 8 oz (104.1 kg) Heparin Dosing Weight: 93.4 kg  Vital Signs: BP: 104/68 mmHg (11/17 0902) Pulse Rate: 73 (11/17 0902)  Labs:  Recent Labs  04/14/14 1016 04/14/14 1022 04/14/14 1136 04/15/14 1954 04/16/14 0024 04/16/14 1205  HGB  --  14.6 13.9  --   --  13.3  HCT  --  43.5 40.4  --   --  38.9*  PLT  --   --  132*  --   --  131*  LABPROT  --   --   --   --   --  13.9  INR  --   --   --   --   --  1.06  HEPARINUNFRC  --   --   --   --  <0.10* 0.43  CREATININE 0.95  --  0.97  --   --   --   TROPONINI  --   --   --  <0.30 <0.30  --     Estimated Creatinine Clearance: 125.2 mL/min (by C-G formula based on Cr of 0.97).   Medications:  Scheduled:  . aspirin  324 mg Oral NOW   Or  . aspirin  300 mg Rectal NOW  . aspirin EC  81 mg Oral Daily  . atorvastatin  40 mg Oral q1800  . metoprolol tartrate  25 mg Oral BID  . pantoprazole  40 mg Oral Daily  . sodium chloride  3 mL Intravenous Q12H   Infusions:  . heparin 1,600 Units/hr (04/16/14 0132)    Assessment: 38 yo M with 2-3 day hx of severe epigastric vs chest pain.  Started on IV heparin with plans for stress test today.  Heparin level is therapeutic on 1600 units/hr.  Goal of Therapy:  Heparin level 0.3-0.7 units/ml Monitor platelets by anticoagulation protocol: Yes   Plan:  Continue heparin at 1600 units/hr Continue daily heparin level and CBC while on heparin Follow up results of stress test  Dixie DialsKimberly Irva Loser, Pharm.D., BCPS Clinical Pharmacist Pager [phone] 04/16/2014 1:17 PM

## 2014-04-16 NOTE — Progress Notes (Signed)
Ref: Pcp Not In System   Subjective:  Chest pain continues. Nuclear stress test with potential ischemia and mild hypokinesia. Good LV systolic function on Echocardiogram.  Objective:  Vital Signs in the last 24 hours: Temp:  [98.3 F (36.8 C)-98.4 F (36.9 C)] 98.3 F (36.8 C) (11/17 1438) Pulse Rate:  [47-97] 62 (11/17 1438) Cardiac Rhythm:  [-] Heart block (11/17 1222) Resp:  [18-22] 18 (11/17 1438) BP: (102-127)/(61-74) 108/68 mmHg (11/17 1438) SpO2:  [96 %-100 %] 100 % (11/17 1438) Weight:  [104.1 kg (229 lb 8 oz)] 104.1 kg (229 lb 8 oz) (11/17 0500)  Physical Exam: BP Readings from Last 1 Encounters:  04/16/14 108/68    Wt Readings from Last 1 Encounters:  04/16/14 104.1 kg (229 lb 8 oz)    Weight change:   HEENT: [place]/AT, Eyes-Blue, PERL, EOMI, Conjunctiva-Pink, Sclera-Non-icteric Neck: No JVD, No bruit, Trachea midline. Lungs:  Clear, Bilateral. Cardiac:  Regular rhythm, normal S1 and S2, no S3. II/VI systolic murmur. Abdomen:  Soft, non-tender. Extremities:  No edema present. No cyanosis. No clubbing. CNS: AxOx3, Cranial nerves grossly intact, moves all 4 extremities.  Skin: Warm and dry.   Intake/Output from previous day: 11/16 0701 - 11/17 0700 In: -  Out: 475 [Urine:475]    Lab Results: BMET    Component Value Date/Time   NA 143 04/16/2014 1205   NA 144 04/14/2014 1136   NA 140 04/14/2014 1016   K 4.2 04/16/2014 1205   K 4.2 04/14/2014 1136   K 4.2 04/14/2014 1016   CL 107 04/16/2014 1205   CL 107 04/14/2014 1136   CL 107 04/14/2014 1016   CO2 22 04/16/2014 1205   CO2 23 04/14/2014 1136   CO2 24 04/14/2014 1016   GLUCOSE 118* 04/16/2014 1205   GLUCOSE 84 04/14/2014 1136   GLUCOSE 96 04/14/2014 1016   BUN 16 04/16/2014 1205   BUN 18 04/14/2014 1136   BUN 18 04/14/2014 1016   CREATININE 0.86 04/16/2014 1205   CREATININE 0.97 04/14/2014 1136   CREATININE 0.95 04/14/2014 1016   CREATININE 0.89 03/09/2013 1007   CREATININE 0.97 02/28/2013 0505    CALCIUM 8.7 04/16/2014 1205   CALCIUM 9.0 04/14/2014 1136   CALCIUM 9.0 04/14/2014 1016   GFRNONAA >90 04/16/2014 1205   GFRNONAA >90 04/14/2014 1136   GFRNONAA >89 04/14/2014 1016   GFRNONAA >90 02/28/2013 0505   GFRAA >90 04/16/2014 1205   GFRAA >90 04/14/2014 1136   GFRAA >89 04/14/2014 1016   GFRAA >90 02/28/2013 0505   CBC    Component Value Date/Time   WBC 7.1 04/16/2014 1205   WBC 7.4 04/14/2014 1022   RBC 4.54 04/16/2014 1205   RBC 4.91 04/14/2014 1022   HGB 13.3 04/16/2014 1205   HGB 14.6 04/14/2014 1022   HCT 38.9* 04/16/2014 1205   HCT 43.5 04/14/2014 1022   PLT 131* 04/16/2014 1205   MCV 85.7 04/16/2014 1205   MCV 88.5 04/14/2014 1022   MCH 29.3 04/16/2014 1205   MCH 29.7 04/14/2014 1022   MCHC 34.2 04/16/2014 1205   MCHC 33.5 04/14/2014 1022   RDW 12.4 04/16/2014 1205   LYMPHSABS 1.9 02/28/2013 0505   MONOABS 0.8 02/28/2013 0505   EOSABS 0.1 02/28/2013 0505   BASOSABS 0.0 02/28/2013 0505   HEPATIC Function Panel  Recent Labs  04/14/14 1016 04/14/14 1136  PROT 6.4 6.6   HEMOGLOBIN A1C No components found for: HGA1C,  MPG CARDIAC ENZYMES Lab Results  Component Value Date  TROPONINI <0.30 04/16/2014   TROPONINI <0.30 04/16/2014   TROPONINI <0.30 04/15/2014   BNP No results for input(s): PROBNP in the last 8760 hours. TSH No results for input(s): TSH in the last 8760 hours. CHOLESTEROL  Recent Labs  04/14/14 1016 04/16/14 1205  CHOL 189 193    Scheduled Meds: . aspirin EC  81 mg Oral Daily  . atorvastatin  40 mg Oral q1800  . metoprolol tartrate  25 mg Oral BID  . pantoprazole  40 mg Oral Daily  . sodium chloride  3 mL Intravenous Q12H   Continuous Infusions: . heparin 1,600 Units/hr (04/16/14 0132)   PRN Meds:.sodium chloride, acetaminophen, ALPRAZolam, HYDROmorphone (DILAUDID) injection, nitroGLYCERIN, ondansetron (ZOFRAN) IV, sodium chloride  Assessment/Plan: Chest pain at rest Tobacco use  disorder Obesity Anxiety  Cardiac cath in AM.    LOS: 1 day    Orpah CobbAjay Nakaya Mishkin  MD  04/16/2014, 6:16 PM

## 2014-04-16 NOTE — Progress Notes (Signed)
Pt next door getting ready to get back under camera for stress test pictures when his chest pain increased to 9/10 that he described as sharp knife like pain.  He says it happened after he drank his grape juice and ate crackers provided after the lexiscan.  Dr. Algie CofferKadakia notified - he says to proceed with pictures under the camera - does not want to give Nitroglycerin.  Informed pt/Nuclear Med Techs to proceed with pictures as per Dr. Algie CofferKadakia.

## 2014-04-17 ENCOUNTER — Encounter (HOSPITAL_COMMUNITY)
Admission: AD | Disposition: A | Discharge status: Home / Self Care | Payer: Self-pay | Source: Ambulatory Visit | Attending: Cardiovascular Disease

## 2014-04-17 ENCOUNTER — Observation Stay (HOSPITAL_COMMUNITY): Payer: 59

## 2014-04-17 DIAGNOSIS — R079 Chest pain, unspecified: Secondary | ICD-10-CM | POA: Diagnosis not present

## 2014-04-17 HISTORY — PX: LEFT HEART CATHETERIZATION WITH CORONARY ANGIOGRAM: SHX5451

## 2014-04-17 LAB — BASIC METABOLIC PANEL
Anion gap: 10 (ref 5–15)
BUN: 11 mg/dL (ref 6–23)
CO2: 24 mEq/L (ref 19–32)
Calcium: 8.5 mg/dL (ref 8.4–10.5)
Chloride: 109 mEq/L (ref 96–112)
Creatinine, Ser: 0.9 mg/dL (ref 0.50–1.35)
GFR calc Af Amer: >90 mL/min (ref >90)
GFR calc non Af Amer: >90 mL/min (ref >90)
Glucose, Bld: 102 mg/dL — ABNORMAL HIGH (ref 70–99)
Potassium: 3.5 mEq/L — ABNORMAL LOW (ref 3.7–5.3)
Sodium: 143 mEq/L (ref 137–147)

## 2014-04-17 LAB — CBC
HCT: 38.9 % — ABNORMAL LOW (ref 39.0–52.0)
Hemoglobin: 13.3 g/dL (ref 13.0–17.0)
MCH: 29.4 pg (ref 26.0–34.0)
MCHC: 34.2 g/dL (ref 30.0–36.0)
MCV: 85.9 fL (ref 78.0–100.0)
Platelets: 138 10*3/uL — ABNORMAL LOW (ref 150–400)
RBC: 4.53 MIL/uL (ref 4.22–5.81)
RDW: 12.3 % (ref 11.5–15.5)
WBC: 6.8 10*3/uL (ref 4.0–10.5)

## 2014-04-17 LAB — PROTIME-INR
INR: 1 (ref 0.00–1.49)
Prothrombin Time: 13.3 seconds (ref 11.6–15.2)

## 2014-04-17 LAB — HEPARIN LEVEL (UNFRACTIONATED): Heparin Unfractionated: 0.16 IU/mL — ABNORMAL LOW (ref 0.30–0.70)

## 2014-04-17 LAB — POCT ACTIVATED CLOTTING TIME: Activated Clotting Time: 101 seconds

## 2014-04-17 LAB — LIPASE, BLOOD: Lipase: 26 U/L (ref 11–59)

## 2014-04-17 LAB — AMYLASE: Amylase: 43 U/L (ref 0–105)

## 2014-04-17 SURGERY — LEFT HEART CATHETERIZATION WITH CORONARY ANGIOGRAM
Anesthesia: LOCAL

## 2014-04-17 MED ORDER — SODIUM CHLORIDE 0.9 % IV SOLN
1.0000 mL/kg/h | INTRAVENOUS | Status: AC
  Administered 2014-04-17: 1 mL/kg/h via INTRAVENOUS

## 2014-04-17 MED ORDER — GI COCKTAIL ~~LOC~~
30.0000 mL | Freq: Once | ORAL | Status: AC
  Administered 2014-04-17: 30 mL via ORAL
  Filled 2014-04-17: qty 30

## 2014-04-17 MED ORDER — ALPRAZOLAM 0.25 MG PO TABS
0.2500 mg | ORAL_TABLET | Freq: Every evening | ORAL | Status: DC | PRN
Start: 2014-04-17 — End: 2014-09-13

## 2014-04-17 MED ORDER — HYDROMORPHONE HCL 1 MG/ML IJ SOLN
INTRAMUSCULAR | Status: AC
  Filled 2014-04-17: qty 1

## 2014-04-17 MED ORDER — NITROGLYCERIN 1 MG/10 ML FOR IR/CATH LAB
INTRA_ARTERIAL | Status: AC
  Filled 2014-04-17: qty 10

## 2014-04-17 MED ORDER — MIDAZOLAM HCL 2 MG/2ML IJ SOLN
INTRAMUSCULAR | Status: AC
  Filled 2014-04-17: qty 2

## 2014-04-17 MED ORDER — ATORVASTATIN CALCIUM 40 MG PO TABS: 40.0000 mg | ORAL_TABLET | Freq: Every day | ORAL | Status: DC

## 2014-04-17 MED ORDER — HEPARIN (PORCINE) IN NACL 2-0.9 UNIT/ML-% IJ SOLN
INTRAMUSCULAR | Status: AC
  Filled 2014-04-17: qty 1500

## 2014-04-17 MED ORDER — ASPIRIN 81 MG PO CHEW
324.0000 mg | CHEWABLE_TABLET | Freq: Once | ORAL | Status: AC
  Administered 2014-04-17: 324 mg via ORAL

## 2014-04-17 MED ORDER — FENTANYL CITRATE 0.05 MG/ML IJ SOLN
INTRAMUSCULAR | Status: AC
  Filled 2014-04-17: qty 2

## 2014-04-17 MED ORDER — LIDOCAINE HCL (PF) 1 % IJ SOLN
INTRAMUSCULAR | Status: AC
  Filled 2014-04-17: qty 30

## 2014-04-17 NOTE — H&P (View-Only) (Signed)
Ref: Pcp Not In System   Subjective:  Chest pain continues. Nuclear stress test with potential ischemia and mild hypokinesia. Good LV systolic function on Echocardiogram.  Objective:  Vital Signs in the last 24 hours: Temp:  [98.3 F (36.8 C)-98.4 F (36.9 C)] 98.3 F (36.8 C) (11/17 1438) Pulse Rate:  [47-97] 62 (11/17 1438) Cardiac Rhythm:  [-] Heart block (11/17 1222) Resp:  [18-22] 18 (11/17 1438) BP: (102-127)/(61-74) 108/68 mmHg (11/17 1438) SpO2:  [96 %-100 %] 100 % (11/17 1438) Weight:  [104.1 kg (229 lb 8 oz)] 104.1 kg (229 lb 8 oz) (11/17 0500)  Physical Exam: BP Readings from Last 1 Encounters:  04/16/14 108/68    Wt Readings from Last 1 Encounters:  04/16/14 104.1 kg (229 lb 8 oz)    Weight change:   HEENT: [place]/AT, Eyes-Blue, PERL, EOMI, Conjunctiva-Pink, Sclera-Non-icteric Neck: No JVD, No bruit, Trachea midline. Lungs:  Clear, Bilateral. Cardiac:  Regular rhythm, normal S1 and S2, no S3. II/VI systolic murmur. Abdomen:  Soft, non-tender. Extremities:  No edema present. No cyanosis. No clubbing. CNS: AxOx3, Cranial nerves grossly intact, moves all 4 extremities.  Skin: Warm and dry.   Intake/Output from previous day: 11/16 0701 - 11/17 0700 In: -  Out: 475 [Urine:475]    Lab Results: BMET    Component Value Date/Time   NA 143 04/16/2014 1205   NA 144 04/14/2014 1136   NA 140 04/14/2014 1016   K 4.2 04/16/2014 1205   K 4.2 04/14/2014 1136   K 4.2 04/14/2014 1016   CL 107 04/16/2014 1205   CL 107 04/14/2014 1136   CL 107 04/14/2014 1016   CO2 22 04/16/2014 1205   CO2 23 04/14/2014 1136   CO2 24 04/14/2014 1016   GLUCOSE 118* 04/16/2014 1205   GLUCOSE 84 04/14/2014 1136   GLUCOSE 96 04/14/2014 1016   BUN 16 04/16/2014 1205   BUN 18 04/14/2014 1136   BUN 18 04/14/2014 1016   CREATININE 0.86 04/16/2014 1205   CREATININE 0.97 04/14/2014 1136   CREATININE 0.95 04/14/2014 1016   CREATININE 0.89 03/09/2013 1007   CREATININE 0.97 02/28/2013 0505    CALCIUM 8.7 04/16/2014 1205   CALCIUM 9.0 04/14/2014 1136   CALCIUM 9.0 04/14/2014 1016   GFRNONAA >90 04/16/2014 1205   GFRNONAA >90 04/14/2014 1136   GFRNONAA >89 04/14/2014 1016   GFRNONAA >90 02/28/2013 0505   GFRAA >90 04/16/2014 1205   GFRAA >90 04/14/2014 1136   GFRAA >89 04/14/2014 1016   GFRAA >90 02/28/2013 0505   CBC    Component Value Date/Time   WBC 7.1 04/16/2014 1205   WBC 7.4 04/14/2014 1022   RBC 4.54 04/16/2014 1205   RBC 4.91 04/14/2014 1022   HGB 13.3 04/16/2014 1205   HGB 14.6 04/14/2014 1022   HCT 38.9* 04/16/2014 1205   HCT 43.5 04/14/2014 1022   PLT 131* 04/16/2014 1205   MCV 85.7 04/16/2014 1205   MCV 88.5 04/14/2014 1022   MCH 29.3 04/16/2014 1205   MCH 29.7 04/14/2014 1022   MCHC 34.2 04/16/2014 1205   MCHC 33.5 04/14/2014 1022   RDW 12.4 04/16/2014 1205   LYMPHSABS 1.9 02/28/2013 0505   MONOABS 0.8 02/28/2013 0505   EOSABS 0.1 02/28/2013 0505   BASOSABS 0.0 02/28/2013 0505   HEPATIC Function Panel  Recent Labs  04/14/14 1016 04/14/14 1136  PROT 6.4 6.6   HEMOGLOBIN A1C No components found for: HGA1C,  MPG CARDIAC ENZYMES Lab Results  Component Value Date  TROPONINI <0.30 04/16/2014   TROPONINI <0.30 04/16/2014   TROPONINI <0.30 04/15/2014   BNP No results for input(s): PROBNP in the last 8760 hours. TSH No results for input(s): TSH in the last 8760 hours. CHOLESTEROL  Recent Labs  04/14/14 1016 04/16/14 1205  CHOL 189 193    Scheduled Meds: . aspirin EC  81 mg Oral Daily  . atorvastatin  40 mg Oral q1800  . metoprolol tartrate  25 mg Oral BID  . pantoprazole  40 mg Oral Daily  . sodium chloride  3 mL Intravenous Q12H   Continuous Infusions: . heparin 1,600 Units/hr (04/16/14 0132)   PRN Meds:.sodium chloride, acetaminophen, ALPRAZolam, HYDROmorphone (DILAUDID) injection, nitroGLYCERIN, ondansetron (ZOFRAN) IV, sodium chloride  Assessment/Plan: Chest pain at rest Tobacco use  disorder Obesity Anxiety  Cardiac cath in AM.    LOS: 1 day    Bruce Corbit  MD  04/16/2014, 6:16 PM      

## 2014-04-17 NOTE — Progress Notes (Signed)
UR completed 

## 2014-04-17 NOTE — Progress Notes (Signed)
Called to patient room by primary RN to assess patient.  Upon arrival to patients room, Rn and family at bedside.  Patient is c/o extremely bad pain.  Patient is holding right upper abdomin quadrant and states he wants pain medicine.  Patient given xanax by primary RN and patient is now resting quietly.  MD paged and updated and orders recieved

## 2014-04-17 NOTE — CV Procedure (Signed)
PROCEDURE:  Left heart catheterization with selective coronary angiography, left ventriculogram.  CLINICAL HISTORY:  This is a 38 year old male with recurrent chest pain.  The risks, benefits, and details of the procedure were explained to the patient.  The patient verbalized understanding and wanted to proceed.  Informed written consent was obtained.  PROCEDURE TECHNIQUE:  The patient was approached from the right femoral artery using a 5 French short sheath.  Left coronary angiography was done using a Judkins L4 guide catheter.  Right coronary angiography was done using a Judkins R4 guide catheter.  Left ventriculography was done using a pigtail catheter.    CONTRAST:  Total of 50 cc.  COMPLICATIONS:  None.  At the end of the procedure a manual device was used for hemostasis.    HEMODYNAMICS:  Aortic pressure was 11/72; LV pressure was 120/10; LVEDP 20.  There was no gradient between the left ventricle and aorta.    ANGIOGRAM/CORONARY ARTERIOGRAM:   The left main coronary artery is normal.  The left anterior descending artery is normal.  The left circumflex artery is normal.  The right coronary artery is dominant and normal.  LEFT VENTRICULOGRAM:  Left ventricular angiogram was done in the 30 RAO projection and revealed apical hypokinesia and mild systolic dysfunction with an estimated ejection fraction of 45 %.  LVEDP was 20 mmHg.  IMPRESSION OF HEART CATHETERIZATION:   1. Normal left main coronary artery. 2. Normal left anterior descending artery and its branches. 3. Normal left circumflex artery and its branches. 4. Normal right coronary artery. 5. Apical hypokinesia with mild left ventricular systolic dysfunction.  LVEDP 20 mmHg.  Ejection fraction 45 %.  RECOMMENDATION:   Life-style modification.

## 2014-04-17 NOTE — Progress Notes (Signed)
Site area: rt groin Site Prior to Removal:  Level 0 Pressure Applied For: 20 minutes Manual:   yes Patient Status During Pull:  stable Post Pull Site:  Level 0 Post Pull Instructions Given:  yes Post Pull Pulses Present: yes Dressing Applied:  tegaderm Bedrest begins @ 0945 Comments: no complications   

## 2014-04-17 NOTE — Progress Notes (Signed)
Rt femoral arterial sheath removed by LMurphy,RN 

## 2014-04-17 NOTE — Interval H&P Note (Signed)
History and Physical Interval Note:  04/17/2014 8:36 AM  Bruce Morales  has presented today for surgery, with the diagnosis of Chest pain  The various methods of treatment have been discussed with the patient and family. After consideration of risks, benefits and other options for treatment, the patient has consented to  Procedure(s): LEFT HEART CATHETERIZATION WITH CORONARY ANGIOGRAM (N/A) as a surgical intervention .  The patient's history has been reviewed, patient examined, no change in status, stable for surgery.  I have reviewed the patient's chart and labs.  Questions were answered to the patient's satisfaction.     Deshay Blumenfeld S

## 2014-04-17 NOTE — Discharge Summary (Signed)
Physician Discharge Summary  Patient ID: Bruce Morales MRN: [id number] DOB/AGE: 36-Feb-1977 36 y.o.  Admit date: 04/15/2014 Discharge date: 04/17/2014  Admission Diagnoses: Chest pain at rest Tobacco use disorder Obesity Anxiety  Discharge Diagnoses:  Principal Problem:   Chest pain at rest Active Problems:   Smoking   Bilateral low back pain without sciatica   Obesity   Anxiety   Mild hyperglycemia   Dyslipidemia   Non-ischemic cardiomyopathy   Paroxysmal sinus bradycardia and 2nd. Degree type I heart block.  Discharged Condition: good  Hospital Course: 38 year old male has recurrent epigastric, both pectoral area chest pain radiating to neck and jaws x 2-3 years but lately worsening. Dizzy feeling with Diclofenac and cyclobenzaprine use and itching with hydrocodone and Oxycodone use. No fever, cough or shortness of breath. Recent ER work-up was unremarkable. He had abnormal nuclear stress test but cardiac catheterization failed to show significant coronary artery disease. He could not tolerate B-blocker or lisinopril due to low heart rate and blood pressure.  He was advised to get GI evaluation on OP basis as needed. A small prescription of xanax was given for anxiety. He will be followed by me in 1 month and he will look for primary care doctor. He also agreed to decrease smoking to quit as soon as possible and decrease cheese and bread intake to improve dyslipidemia along with Lipitor use. He will refrain from heavy duty pulling, pushing or lifting over 20 pounds x 3 months or more as needed.  Consults: cardiology  Significant Diagnostic Studies: labs: Near normal CBC with mild thrombocytopenia. Normal BMET with mild hyperglycemia. Normal Amylase and Lipase.  EKG-SR with 1st degree AV block and  few VPCs  Nuclear stress test:  1. Small to moderate-sized area of potential pharmacologically induced ischemia involving the apical aspect of the anterior wall of the left  ventricle. 2. No scintigraphic evidence of prior infarction. 3. Mild global hypokinesia. Ejection fraction - 47%  CTA CHEST 1. No evidence of acute arterial abnormality. 2. No acute cardiopulmonary process.  CTA ABDOMEN 1. No evidence of acute arterial abnormality. 2. Surgical changes of prior cholecystectomy. No evidence of intra or extrahepatic biliary ductal dilatation. 3. No acute abnormality identified in the abdomen.  X-ray abdomen: No acute findings.  X-ray chest: Mild peribronchial thickening -likely chronic. No other significant abnormalities.  Cardiac cath: 1. Normal left main coronary artery. 2. Normal left anterior descending artery and its branches. 3. Normal left circumflex artery and its branches. 4. Normal right coronary artery. 5. Apical hypokinesia with mild left ventricular systolic dysfunction. LVEDP 20 mmHg. Ejection fraction 45 %.  Treatments: cardiac meds: Atorvastatin, Alprazolam, Albuterol and Omeprazole.  Discharge Exam: Blood pressure 113/67, pulse 75, temperature 98.9 F (37.2 C), temperature source Oral, resp. rate 18, weight 104.3 kg (229 lb 15 oz), SpO2 98 %. General: Obese WM is averagely built and well nourished. HEENT: Blue eyes, PERRLA, EOMI, Conj-pink, Sclera-non-icteric Neck: No JVD, Supple Heart: RRR, II/VI systolic murmur. Lungs: Clear, breath sounds, bilateral  Abdomen: obese, soft, non tender Extremities: No edema, c yanosis or clubbing. Skin: warm and dry Neuro: alert, non focal. Moves all 4 extremities  Disposition: 01-Home or Self Care     Medication List    STOP taking these medications        oxyCODONE-acetaminophen 5-325 MG per tablet  Commonly known as:  PERCOCET/ROXICET      TAKE these medications        albuterol 108 (90 BASE) MCG/ACT inhaler  Commonly known as:  PROVENTIL HFA;VENTOLIN HFA  Inhale 2 puffs into the lungs every 4 (four) hours as needed for wheezing or shortness of breath (cough, shortness  of breath or wheezing.).     ALPRAZolam 0.25 MG tablet  Commonly known as:  XANAX  Take 1 tablet (0.25 mg total) by mouth at bedtime as needed for anxiety.     atorvastatin 40 MG tablet  Commonly known as:  LIPITOR  Take 1 tablet (40 mg total) by mouth daily at 6 PM.     cyclobenzaprine 5 MG tablet  Commonly known as:  FLEXERIL  Take 1 tablet (5 mg total) by mouth 3 (three) times daily as needed for muscle spasms.     diclofenac 75 MG EC tablet  Commonly known as:  VOLTAREN  Take 1 tablet (75 mg total) by mouth 2 (two) times daily.     omeprazole 40 MG capsule  Commonly known as:  PRILOSEC  Take 1 capsule (40 mg total) by mouth daily.           Follow-up Information    Follow up with Pcp Not In System.   Why:  GI or Lung doctor referral as needed      [facility]. Schedule an appointment as soon as possible for a visit in 1 month.   Specialty:  Cardiology   Contact information:   [address] [place] [address] [phone]       Signed: Ricki RodriguezKADAKIA,Rayshard Schirtzinger S 04/17/2014, 6:15 PM

## 2014-04-19 NOTE — Telephone Encounter (Signed)
Pt called in and wants Dr Merla Richesoolittle to call him concerning his visit @ the hospital and the plan for his CP. He can be reached @ [phone]. Thank you

## 2014-04-19 NOTE — Telephone Encounter (Signed)
Lm for pt to RTC on Sunday to discuss with Dr. Merla Richesoolittle.

## 2014-05-06 ENCOUNTER — Telehealth: Payer: Self-pay

## 2014-05-06 DIAGNOSIS — R079 Chest pain, unspecified: Secondary | ICD-10-CM

## 2014-05-06 NOTE — Telephone Encounter (Signed)
Patient wants a referral to a different cardiologist for a second opinion. He would like to see Dr Georges MouseIan Grimm in [place]

## 2014-05-08 NOTE — Telephone Encounter (Signed)
Pt has an appt on Jan 7. He would like to get in sooner if possible.

## 2014-05-09 ENCOUNTER — Encounter (HOSPITAL_COMMUNITY): Payer: Self-pay | Admitting: Cardiovascular Disease

## 2014-05-09 NOTE — Telephone Encounter (Signed)
Does this appt need to be moved up sooner than Jan 7

## 2014-05-09 NOTE — Telephone Encounter (Signed)
Spoke with dr Josetta HuddleGrimm office in [place] and Dr. Merla Richesoolittle will need to contact them in order to move this appt up since this is a second opinion and cardiology is recommending this

## 2014-07-28 ENCOUNTER — Encounter (HOSPITAL_COMMUNITY): Payer: Self-pay | Admitting: Emergency Medicine

## 2014-07-28 ENCOUNTER — Emergency Department (HOSPITAL_COMMUNITY): Payer: 59

## 2014-07-28 ENCOUNTER — Emergency Department (HOSPITAL_COMMUNITY)
Admission: EM | Admit: 2014-07-28 | Discharge: 2014-07-28 | Disposition: A | Discharge status: Home / Self Care | Payer: 59 | Attending: Emergency Medicine | Admitting: Emergency Medicine

## 2014-07-28 DIAGNOSIS — Z8679 Personal history of other diseases of the circulatory system: Secondary | ICD-10-CM | POA: Insufficient documentation

## 2014-07-28 DIAGNOSIS — G8929 Other chronic pain: Secondary | ICD-10-CM | POA: Diagnosis not present

## 2014-07-28 DIAGNOSIS — R109 Unspecified abdominal pain: Secondary | ICD-10-CM | POA: Diagnosis present

## 2014-07-28 DIAGNOSIS — Z720 Tobacco use: Secondary | ICD-10-CM | POA: Insufficient documentation

## 2014-07-28 DIAGNOSIS — Z791 Long term (current) use of non-steroidal anti-inflammatories (NSAID): Secondary | ICD-10-CM | POA: Diagnosis not present

## 2014-07-28 DIAGNOSIS — E860 Dehydration: Secondary | ICD-10-CM | POA: Insufficient documentation

## 2014-07-28 DIAGNOSIS — R079 Chest pain, unspecified: Secondary | ICD-10-CM | POA: Diagnosis not present

## 2014-07-28 DIAGNOSIS — Z8739 Personal history of other diseases of the musculoskeletal system and connective tissue: Secondary | ICD-10-CM | POA: Insufficient documentation

## 2014-07-28 DIAGNOSIS — Z9889 Other specified postprocedural states: Secondary | ICD-10-CM | POA: Insufficient documentation

## 2014-07-28 DIAGNOSIS — Z8719 Personal history of other diseases of the digestive system: Secondary | ICD-10-CM | POA: Diagnosis not present

## 2014-07-28 DIAGNOSIS — Z79899 Other long term (current) drug therapy: Secondary | ICD-10-CM | POA: Diagnosis not present

## 2014-07-28 DIAGNOSIS — Z8709 Personal history of other diseases of the respiratory system: Secondary | ICD-10-CM | POA: Insufficient documentation

## 2014-07-28 DIAGNOSIS — R1110 Vomiting, unspecified: Secondary | ICD-10-CM

## 2014-07-28 DIAGNOSIS — R197 Diarrhea, unspecified: Secondary | ICD-10-CM | POA: Insufficient documentation

## 2014-07-28 LAB — CBC
HCT: 54.3 % — ABNORMAL HIGH (ref 39.0–52.0)
Hemoglobin: 19 g/dL — ABNORMAL HIGH (ref 13.0–17.0)
MCH: 31.3 pg (ref 26.0–34.0)
MCHC: 35 g/dL (ref 30.0–36.0)
MCV: 89.5 fL (ref 78.0–100.0)
Platelets: 140 10*3/uL — ABNORMAL LOW (ref 150–400)
RBC: 6.07 MIL/uL — ABNORMAL HIGH (ref 4.22–5.81)
RDW: 12.7 % (ref 11.5–15.5)
WBC: 14.1 10*3/uL — ABNORMAL HIGH (ref 4.0–10.5)

## 2014-07-28 LAB — BASIC METABOLIC PANEL
Anion gap: 13 (ref 5–15)
BUN: 29 mg/dL — ABNORMAL HIGH (ref 6–23)
CO2: 25 mmol/L (ref 19–32)
Calcium: 9.6 mg/dL (ref 8.4–10.5)
Chloride: 104 mmol/L (ref 96–112)
Creatinine, Ser: 1.4 mg/dL — ABNORMAL HIGH (ref 0.50–1.35)
GFR calc Af Amer: 74 mL/min — ABNORMAL LOW (ref >90)
GFR calc non Af Amer: 63 mL/min — ABNORMAL LOW (ref >90)
Glucose, Bld: 132 mg/dL — ABNORMAL HIGH (ref 70–99)
Potassium: 3.3 mmol/L — ABNORMAL LOW (ref 3.5–5.1)
Sodium: 142 mmol/L (ref 135–145)

## 2014-07-28 LAB — HEPATIC FUNCTION PANEL
ALT: 45 U/L (ref 0–53)
AST: 27 U/L (ref 0–37)
Albumin: 5.2 g/dL (ref 3.5–5.2)
Alkaline Phosphatase: 56 U/L (ref 39–117)
Bilirubin, Direct: 0.2 mg/dL (ref 0.0–0.5)
Indirect Bilirubin: 1.4 mg/dL — ABNORMAL HIGH (ref 0.3–0.9)
Total Bilirubin: 1.6 mg/dL — ABNORMAL HIGH (ref 0.3–1.2)
Total Protein: 8.3 g/dL (ref 6.0–8.3)

## 2014-07-28 LAB — LIPASE, BLOOD: Lipase: 25 U/L (ref 11–59)

## 2014-07-28 LAB — I-STAT TROPONIN, ED: Troponin i, poc: 0 ng/mL (ref 0.00–0.08)

## 2014-07-28 MED ORDER — LORAZEPAM 2 MG/ML IJ SOLN
0.5000 mg | Freq: Once | INTRAMUSCULAR | Status: AC
  Administered 2014-07-28: 0.5 mg via INTRAVENOUS
  Filled 2014-07-28: qty 1

## 2014-07-28 MED ORDER — MORPHINE SULFATE 4 MG/ML IJ SOLN
4.0000 mg | Freq: Once | INTRAMUSCULAR | Status: AC
  Administered 2014-07-28: 4 mg via INTRAVENOUS
  Filled 2014-07-28: qty 1

## 2014-07-28 MED ORDER — ONDANSETRON HCL 4 MG/2ML IJ SOLN
4.0000 mg | Freq: Once | INTRAMUSCULAR | Status: AC
  Administered 2014-07-28: 4 mg via INTRAVENOUS
  Filled 2014-07-28: qty 2

## 2014-07-28 MED ORDER — PANTOPRAZOLE SODIUM 40 MG IV SOLR
40.0000 mg | Freq: Once | INTRAVENOUS | Status: AC
  Administered 2014-07-28: 40 mg via INTRAVENOUS
  Filled 2014-07-28: qty 40

## 2014-07-28 MED ORDER — ACETAMINOPHEN 325 MG PO TABS
650.0000 mg | ORAL_TABLET | Freq: Once | ORAL | Status: AC
  Administered 2014-07-28: 650 mg via ORAL
  Filled 2014-07-28: qty 2

## 2014-07-28 MED ORDER — SODIUM CHLORIDE 0.9 % IV BOLUS (SEPSIS)
1000.0000 mL | Freq: Once | INTRAVENOUS | Status: AC
  Administered 2014-07-28: 1000 mL via INTRAVENOUS

## 2014-07-28 MED ORDER — LOPERAMIDE HCL 2 MG PO CAPS
2.0000 mg | ORAL_CAPSULE | Freq: Once | ORAL | Status: AC
  Administered 2014-07-28: 2 mg via ORAL
  Filled 2014-07-28: qty 1

## 2014-07-28 MED ORDER — ONDANSETRON HCL 8 MG PO TABS: 8.0000 mg | ORAL_TABLET | Freq: Three times a day (TID) | ORAL | Status: DC | PRN

## 2014-07-28 NOTE — ED Notes (Signed)
Pt. Ambulated in hallway slowly and capably.

## 2014-07-28 NOTE — ED Notes (Addendum)
Pt from home c/o epigastric pain, vomiting, and diarrhea since 0400. He describes his pain as sharp. Pt pale upon arrival. Pt reports taking Zofran this am without relief. He reports cardiac catheterization several months ago with no cause of chest pain. He denies use of any other medications.

## 2014-07-28 NOTE — ED Provider Notes (Signed)
CSN: [id number]     Arrival date & time 07/28/14  1004 History   First MD Initiated Contact with Patient 07/28/14 1009     Chief Complaint  Patient presents with  . Chest Pain  . Emesis  . Diarrhea     Patient is a 39 y.o. male presenting with vomiting. The history is provided by the patient.  Emesis Severity:  Severe Duration:  8 hours Timing:  Intermittent Progression:  Worsening Chronicity:  New Relieved by:  Nothing Worsened by:  Nothing tried Associated symptoms: abdominal pain, chills, cough, diarrhea and headaches   Associated symptoms comment:  Chest pain  Risk factors: sick contacts   Risk factors: no alcohol use and no travel to endemic areas   Patient presents from home for acute onset of nausea/vomting/diarrhea onset 8 hrs ago. This woke him up from sleep Wife reports he has had "over 25 episodes" No blood in vomit/stool He had been well prior to going to sleep for the night He reports since vomiting he has had chest pain/abdominal pain  Soc hx - denies ETOH/drug use  Past Medical History  Diagnosis Date  . Gall stones   . Chronic lower back pain   . Anginal pain 04/15/2014  . Chronic bronchitis     "probably get it q yr"  . Arthritis     "wrists, knees, lower back" (04/16/2014)  . Chronic lower back pain    Past Surgical History  Procedure Laterality Date  . Cholecystectomy  2011  . Knee arthroscopy Right 2013 X 2  . Esophagogastroduodenoscopy N/A 02/27/2013    Procedure: ESOPHAGOGASTRODUODENOSCOPY (EGD);  Surgeon: Florencia Reasons, MD;  Location: [facility];  Service: Endoscopy;  Laterality: N/A;  . Left heart catheterization with coronary angiogram N/A 04/17/2014    Procedure: LEFT HEART CATHETERIZATION WITH CORONARY ANGIOGRAM;  Surgeon: Ricki Rodriguez, MD;  Location: [facility];  Service: Cardiovascular;  Laterality: N/A;   Family History  Problem Relation Age of Onset  . Multiple sclerosis Mother   . Heart failure Maternal Grandfather   .  Diabetes Father   . Fibromyalgia Sister   . Schizophrenia Brother   . Stroke Maternal Grandmother   . Diabetes Paternal Grandmother    History  Substance Use Topics  . Smoking status: Current Every Day Smoker -- 0.50 packs/day for 27 years    Types: Cigarettes  . Smokeless tobacco: Former Neurosurgeon  . Alcohol Use: Yes     Comment: 04/16/2014 "a 6 pack will last me a month"    Review of Systems  Constitutional: Positive for chills.  Respiratory: Positive for cough.   Cardiovascular: Positive for chest pain.  Gastrointestinal: Positive for vomiting, abdominal pain and diarrhea.  Neurological: Positive for headaches.  All other systems reviewed and are negative.     Allergies  Hydrocodone; Oxycodone; and Percocet  Home Medications   Prior to Admission medications   Medication Sig Start Date End Date Taking? Authorizing Provider  albuterol (PROVENTIL HFA;VENTOLIN HFA) 108 (90 BASE) MCG/ACT inhaler Inhale 2 puffs into the lungs every 4 (four) hours as needed for wheezing or shortness of breath (cough, shortness of breath or wheezing.). 03/31/14   Carmelina Dane, MD  ALPRAZolam Prudy Feeler) 0.25 MG tablet Take 1 tablet (0.25 mg total) by mouth at bedtime as needed for anxiety. 04/17/14   Ricki Rodriguez, MD  atorvastatin (LIPITOR) 40 MG tablet Take 1 tablet (40 mg total) by mouth daily at 6 PM. 04/17/14   Ricki Rodriguez, MD  cyclobenzaprine (FLEXERIL) 5 MG tablet Take 1 tablet (5 mg total) by mouth 3 (three) times daily as needed for muscle spasms. 04/13/14   Peyton Najjar, MD  diclofenac (VOLTAREN) 75 MG EC tablet Take 1 tablet (75 mg total) by mouth 2 (two) times daily. 04/13/14   Peyton Najjar, MD  omeprazole (PRILOSEC) 40 MG capsule Take 1 capsule (40 mg total) by mouth daily. 04/14/14   Tilden Fossa, MD   BP 121/78 mmHg  Pulse 112  Temp(Src) 98.2 F (36.8 C) (Oral)  Resp 20  SpO2 100% Physical Exam CONSTITUTIONAL: Well developed/well nourished, anxious, heaves frequently  while in room HEAD: Normocephalic/atraumatic EYES: EOMI/PERRL, no icterus ENMT: Mucous membranes dry NECK: supple no meningeal signs SPINE/BACK:entire spine nontender CV: S1/S2 noted, no murmurs/rubs/gallops noted LUNGS: Lungs are clear to auscultation bilaterally, no apparent distress ABDOMEN: soft, nontender, no rebound or guarding, bowel sounds noted throughout abdomen GU:no cva tenderness NEURO: Pt is awake/alert/appropriate, moves all extremitiesx4.  No facial droop.   EXTREMITIES: pulses normal/equal, full ROM SKIN: warm, color normal PSYCH: anxious  ED Course  Procedures   10:41 AM Pt with abrupt onset of V/D 8 hrs ago He appears uncomfortable but abdominal exam unremarkable He reports CP - EKG unchanged and had negative cardiac cath in 2015 Suspect viral gastroenteritis at this time 1:13 PM Pt with continued nausea He had drop in BP (90s) now improved with SBP>100 3:33 PM Pt improved He is ambulatory No vomiting and he is taking PO No hypotension on repeat check He does have fever given APAP He has no focal abd tenderness (no RLQ tenderness) He is in no distress I doubt acute abdominal emergency at this time, suspect viral illness  At time of discharge, pt concerned about persistent CP He has had chest pain for over a year He reports today's episode similar to prior He has no EKG changes CXR negative He has already had negative cardiac cath I doubt ACS/Pe/Dissection He already has specialist care as outpatient Informed patient we have ruled out acute/emergent causes of his CP   Labs Review Labs Reviewed  CBC - Abnormal; Notable for the following:    WBC 14.1 (*)    RBC 6.07 (*)    Hemoglobin 19.0 (*)    HCT 54.3 (*)    Platelets 140 (*)    All other components within normal limits  BASIC METABOLIC PANEL - Abnormal; Notable for the following:    Potassium 3.3 (*)    Glucose, Bld 132 (*)    BUN 29 (*)    Creatinine, Ser 1.40 (*)    GFR calc non Af  Amer 63 (*)    GFR calc Af Amer 74 (*)    All other components within normal limits  HEPATIC FUNCTION PANEL - Abnormal; Notable for the following:    Total Bilirubin 1.6 (*)    Indirect Bilirubin 1.4 (*)    All other components within normal limits  LIPASE, BLOOD  I-STAT TROPOININ, ED    Imaging Review Dg Chest Port 1 View  07/28/2014   CLINICAL DATA:  39 year old male with 2 day history of lethargy, weakness, nausea, vomiting and diarrhea  EXAM: PORTABLE CHEST - 1 VIEW  COMPARISON:  Prior chest x-ray 04/14/2014  FINDINGS: The lungs are clear and negative for focal airspace consolidation, pulmonary edema or suspicious pulmonary nodule. No pleural effusion or pneumothorax. Cardiac and mediastinal contours are within normal limits. No acute fracture or lytic or blastic osseous lesions. The visualized upper abdominal  bowel gas pattern is unremarkable.  IMPRESSION: Negative chest x-ray.   Electronically Signed   By: Malachy MoanHeath  McCullough M.D.   On: 07/28/2014 10:42     EKG Interpretation   Date/Time:  Sunday July 28 2014 10:08:02 EST Ventricular Rate:  109 PR Interval:  158 QRS Duration: 89 QT Interval:  313 QTC Calculation: 421 R Axis:   101 Text Interpretation:  Sinus tachycardia Low voltage, precordial leads  Consider right ventricular hypertrophy rate is faster than previous EKG  Confirmed by Bebe ShaggyWICKLINE  MD, Andreah Goheen ([id number]) on 07/28/2014 10:15:26 AM     Medications  ondansetron (ZOFRAN) injection 4 mg (4 mg Intravenous Given 07/28/14 1020)  sodium chloride 0.9 % bolus 1,000 mL (0 mLs Intravenous Stopped 07/28/14 1304)  pantoprazole (PROTONIX) injection 40 mg (40 mg Intravenous Given 07/28/14 1053)  ondansetron (ZOFRAN) injection 4 mg (4 mg Intravenous Given 07/28/14 1053)  loperamide (IMODIUM) capsule 2 mg (2 mg Oral Given 07/28/14 1140)  sodium chloride 0.9 % bolus 1,000 mL (0 mLs Intravenous Stopped 07/28/14 1408)  morphine 4 MG/ML injection 4 mg (4 mg Intravenous Given 07/28/14 1140)   ondansetron (ZOFRAN) injection 4 mg (4 mg Intravenous Given 07/28/14 1318)  LORazepam (ATIVAN) injection 0.5 mg (0.5 mg Intravenous Given 07/28/14 1319)  acetaminophen (TYLENOL) tablet 650 mg (650 mg Oral Given 07/28/14 1512)    MDM   Final diagnoses:  Abdominal pain, unspecified abdominal location  Vomiting and diarrhea  Dehydration  Chest pain, unspecified chest pain type    Nursing notes including past medical history and social history reviewed and considered in documentation Labs/vital reviewed myself and considered during evaluation xrays/imaging reviewed by myself and considered during evaluation     Joya Gaskinsonald W Jareth Pardee, MD 07/28/14 1536

## 2014-07-28 NOTE — ED Notes (Signed)
Dr. Bebe ShaggyWickline notified of continued abd. Discomfort and persistent diarrhea.

## 2014-09-13 ENCOUNTER — Ambulatory Visit (INDEPENDENT_AMBULATORY_CARE_PROVIDER_SITE_OTHER): Payer: 59 | Admitting: Internal Medicine

## 2014-09-13 VITALS — BP 110/84 | HR 80 | Temp 98.0°F | Resp 18 | Ht 69.0 in | Wt 217.8 lb

## 2014-09-13 DIAGNOSIS — F17200 Nicotine dependence, unspecified, uncomplicated: Secondary | ICD-10-CM

## 2014-09-13 DIAGNOSIS — R1084 Generalized abdominal pain: Secondary | ICD-10-CM | POA: Diagnosis not present

## 2014-09-13 DIAGNOSIS — R634 Abnormal weight loss: Secondary | ICD-10-CM

## 2014-09-13 DIAGNOSIS — R5382 Chronic fatigue, unspecified: Secondary | ICD-10-CM | POA: Diagnosis not present

## 2014-09-13 DIAGNOSIS — R079 Chest pain, unspecified: Secondary | ICD-10-CM

## 2014-09-13 DIAGNOSIS — G4710 Hypersomnia, unspecified: Secondary | ICD-10-CM

## 2014-09-13 DIAGNOSIS — R1314 Dysphagia, pharyngoesophageal phase: Secondary | ICD-10-CM

## 2014-09-13 DIAGNOSIS — M545 Low back pain: Secondary | ICD-10-CM

## 2014-09-13 DIAGNOSIS — R21 Rash and other nonspecific skin eruption: Secondary | ICD-10-CM | POA: Diagnosis not present

## 2014-09-13 DIAGNOSIS — Z720 Tobacco use: Secondary | ICD-10-CM | POA: Diagnosis not present

## 2014-09-13 DIAGNOSIS — IMO0001 Reserved for inherently not codable concepts without codable children: Secondary | ICD-10-CM

## 2014-09-13 LAB — POCT SEDIMENTATION RATE: POCT SED RATE: 7 mm/hr (ref 0–22)

## 2014-09-13 LAB — POCT CBC
Granulocyte percent: 58 %G (ref 37–80)
HCT, POC: 44.3 % (ref 43.5–53.7)
Hemoglobin: 14.9 g/dL (ref 14.1–18.1)
Lymph, poc: 2.4 (ref 0.6–3.4)
MCH, POC: 29.8 pg (ref 27–31.2)
MCHC: 33.6 g/dL (ref 31.8–35.4)
MCV: 88.8 fL (ref 80–97)
MID (cbc): 0.4 (ref 0–0.9)
MPV: 9 fL (ref 0–99.8)
POC Granulocyte: 3.9 (ref 2–6.9)
POC LYMPH PERCENT: 35.4 %L (ref 10–50)
POC MID %: 6.6 %M (ref 0–12)
Platelet Count, POC: 135 10*3/uL — AB (ref 142–424)
RBC: 4.99 M/uL (ref 4.69–6.13)
RDW, POC: 13.5 %
WBC: 6.8 10*3/uL (ref 4.6–10.2)

## 2014-09-13 LAB — POCT GLYCOSYLATED HEMOGLOBIN (HGB A1C): Hemoglobin A1C: 5.2
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
## 2014-09-14 LAB — COMPREHENSIVE METABOLIC PANEL
ALT: 49 U/L (ref 0–53)
AST: 24 U/L (ref 0–37)
Albumin: 4.7 g/dL (ref 3.5–5.2)
Alkaline Phosphatase: 45 U/L (ref 39–117)
BUN: 13 mg/dL (ref 6–23)
CO2: 29 mEq/L (ref 19–32)
Calcium: 9.7 mg/dL (ref 8.4–10.5)
Chloride: 103 mEq/L (ref 96–112)
Creat: 0.95 mg/dL (ref 0.50–1.35)
Glucose, Bld: 84 mg/dL (ref 70–99)
Potassium: 4.8 mEq/L (ref 3.5–5.3)
Sodium: 141 mEq/L (ref 135–145)
Total Bilirubin: 0.9 mg/dL (ref 0.2–1.2)
Total Protein: 6.9 g/dL (ref 6.0–8.3)

## 2014-09-14 LAB — TSH: TSH: 1.449 u[IU]/mL (ref 0.350–4.500)

## 2014-09-15 ENCOUNTER — Telehealth: Payer: Self-pay

## 2014-09-15 NOTE — Telephone Encounter (Signed)
Pt was supposed to take home a hemosure and instead was given a hemoccult. Attempted to run the test but it was hard to determine pos vs neg. Spoke with Maralyn SagoSarah, who agrees that we should mail pt a hemosure test at no charge.  Left message on machine to call back on both numbers

## 2014-09-16 NOTE — Addendum Note (Signed)
Addended by: Tonye PearsonOLITTLE, Rj Pedrosa P on: 09/16/2014 04:01 PM   Modules accepted: Level of Service

## 2014-09-16 NOTE — Telephone Encounter (Signed)
Pt notified and hemosure mailed.  Referrals: Pt wants to know if it is possible for him to see Dr. Matthias HughsBuccini this week?

## 2014-09-22 NOTE — Consult Note (Signed)
    General Aspect This is a 39 year old male that had a arthroscopy of the right Knee done earlier today without complication.  His preop EKG showed a T-wave inversion in lead 3.  Repeat EKG today shows T-wave inversion in lead 3 as well.  Patient did have some PVCs during the procedure, so a cardiology consult was requested.  Patient states he has a smoking history since age 89, but did quit smoking, about 2 months ago.  He has gained about 20 pounds over the last 6 months.  He describes some intermittent chest discomfort which usually last just a couple minutes and does not sound typical for CAD that he's had off and on for years.  He has no other health problems.  He did have his gallbladder removed in the past.  It does sound like he has some mild snoring and possible symptoms related to sleep apnea.  He currently Is without any chest discomfort, shortness of breath.  He is in normal sinus rhythm on the monitor with a rare PVC.  His blood pressure is normal.   Physical Exam:   GEN WD, NAD    HEENT pink conjunctivae    RESP normal resp effort  clear BS    CARD Regular rate and rhythm  Normal, S1, S2  No murmur    ABD denies tenderness    EXTR Status post arthrectomy right knee    SKIN normal to palpation    NEURO cranial nerves intact, motor/sensory function intact    PSYCH alert, A+O to time, place, person   Review of Systems:   Subjective/Chief Complaint EKG changes    General: Weight loss or gain  20 lb& in 6 months    Respiratory: Recent smoking cessation    Cardiovascular: Chest pain or discomfort  Intermittent a over 1-2 yrs atypical    Musculoskeletal: rt knee pain    Review of Systems: All other systems were reviewed and found to be negative    No Known Allergies:     Impression 39 year old male status post Arthroscopy today of the right knee With T-wave inversion noted in lead 3 which is unchanged during surgery, as well as PVCs.  Patient has history of tobacco abuse  with recent smoking cessation, 20 pound weight gain, possibly sleep apnea, currently is pain free and hemodynamically stable.  Patient does have history of chest pain over one to 2 years which sounds atypical.  Patient can be discharged home with followup in cardiology in one to 2 weeks for further evaluation of risk factors.  Patient was discussed with Dr. Gwen PoundsKowalski, who is in agreement with the above plan.   Electronic Signatures: Rudi Cocoarroll, Donna (NP)  (Signed 19-Feb-13 16:35)  Authored: General Aspect/Present Illness, History and Physical Exam, Review of System, Allergies, Impression/Plan   Last Updated: 19-Feb-13 16:35 by Rudi Cocoarroll, Donna (NP)

## 2014-09-22 NOTE — Op Note (Signed)
PATIENT NAME:  Bruce Morales, [name] MR#:  [id number] DATE OF BIRTH:  1976-04-10  DATE OF PROCEDURE:  07/20/2011  PREOPERATIVE DIAGNOSES:  1. Right knee medial meniscus tear with meniscal cyst.  2. Patellar subluxation.   POSTOPERATIVE DIAGNOSES:  1. Right knee medial meniscus tear with meniscal cyst.  2. Patellar subluxation.   PROCEDURES:  1. Right knee arthroscopic lateral release. 2. Partial medial meniscectomy.   SURGEON: Leitha SchullerMichael J. Marbeth Smedley, MD  ANESTHESIA: General.    DESCRIPTION OF PROCEDURE: Patient was brought to the Operating Room and after adequate anesthesia was obtained, the right leg was placed in the arthroscopic legholder with a tourniquet applied but not inflated. After prepping and draping in the usual sterile manner, appropriate patient identification and timeout procedures were completed. Following this the inferolateral portal was made and the arthroscope was introduced. Initial inspection of the suprapatellar pouch revealed a very tight lateral retinaculum with significant subluxation of the patella. There is also some fissuring of the very far lateral facet cartilage of the patella. The trochlea appeared normal. Coming around medially, the gutter was normal in appearance. An inferomedial portal was made. The articular cartilage on the femur was normal in appearance. On the tibia there was some softening, grade I changes. On probing of the posterior horn of the medial meniscus there was an oblique tear involving most of the posterior third of the meniscus. The inferior surface was unstable and was debrided back to a stable margin with meniscal punch and ArthroCare wand being used. The meniscal cyst was punctured as well and drained. It communicated with the joint after debridement of the torn meniscus. This was carried out after finishing the remaining diagnostic portion of the arthroscopy. The anterior cruciate ligament was intact and the lateral compartment had similar articular  appearance to the medial side with normal femoral condyle and some fissuring with grade I changes on the tibial condyle. The meniscus was intact laterally. The lateral gutter was normal. After first addressing that meniscal pathology as previously described, the ArthroCare wand was used to perform a lateral release releasing the tight lateral retinaculum near its attachment onto the patella. Following this, the patella tracked in the midline in flexion where previously it had been very tightly bound and pressed against the lateral aspect of the trochlea and could not passively be placed in the midline. After successful lateral release and addressing the meniscal pathology, all instrumentation was withdrawn after thorough irrigation of the joint. The wounds were closed with simple interrupted 4-0 nylon to each incision. 30 mL of 0.5% Sensorcaine was infiltrated into the area of the portal as well as into the area of the lateral release. Sterile dressings of Xeroform, 4 x 4's, Webril, and Ace wrap applied and the patient was sent to recovery in stable condition.   ESTIMATED BLOOD LOSS: Minimal.   COMPLICATIONS: None.   SPECIMENS: None.  NOTE: Additionally arthroscopy pictures pre- and postprocedure were obtained.   ____________________________ Leitha SchullerMichael J. Khadeja Abt, MD mjm:cms D: 07/20/2011 20:30:53 ET T: 07/21/2011 08:27:53 ET JOB#: [id number]  cc: Leitha SchullerMichael J. Jaleesa Cervi, MD, <Dictator> Leitha SchullerMICHAEL J Kaylen Motl MD ELECTRONICALLY SIGNED 07/21/2011 12:39

## 2014-09-23 ENCOUNTER — Encounter: Payer: Self-pay | Admitting: Internal Medicine

## 2014-09-23 LAB — IFOBT (OCCULT BLOOD): IFOBT: NEGATIVE

## 2014-09-26 ENCOUNTER — Telehealth: Payer: Self-pay

## 2014-09-26 NOTE — Telephone Encounter (Signed)
Pt called. Received letter in the mail. Wanted to know if his TSH was normal. Let him know that it was WNL.

## 2014-11-13 ENCOUNTER — Encounter: Payer: Self-pay | Admitting: Neurology

## 2014-11-13 ENCOUNTER — Ambulatory Visit (INDEPENDENT_AMBULATORY_CARE_PROVIDER_SITE_OTHER): Payer: 59 | Admitting: Neurology

## 2014-11-13 VITALS — BP 140/84 | HR 82 | Resp 20 | Ht 69.0 in | Wt 224.0 lb

## 2014-11-13 DIAGNOSIS — R0609 Other forms of dyspnea: Secondary | ICD-10-CM | POA: Diagnosis not present

## 2014-11-13 DIAGNOSIS — M5410 Radiculopathy, site unspecified: Secondary | ICD-10-CM

## 2014-11-13 DIAGNOSIS — E669 Obesity, unspecified: Secondary | ICD-10-CM

## 2014-11-13 DIAGNOSIS — R0789 Other chest pain: Secondary | ICD-10-CM | POA: Insufficient documentation

## 2014-11-13 DIAGNOSIS — R0689 Other abnormalities of breathing: Secondary | ICD-10-CM

## 2014-11-13 DIAGNOSIS — Z6838 Body mass index (BMI) 38.0-38.9, adult: Secondary | ICD-10-CM

## 2014-11-13 DIAGNOSIS — E66812 Obesity, class 2: Secondary | ICD-10-CM | POA: Insufficient documentation

## 2014-11-13 DIAGNOSIS — R0683 Snoring: Secondary | ICD-10-CM | POA: Insufficient documentation

## 2014-11-13 NOTE — Patient Instructions (Signed)
Sleep Apnea  Sleep apnea is disorder that affects a person's sleep. A person with sleep apnea has abnormal pauses in their breathing when they sleep. It is hard for them to get a good sleep. This makes a person tired during the day. It also can lead to other physical problems. There are three types of sleep apnea. One type is when breathing stops for a short time because your airway is blocked (obstructive sleep apnea). Another type is when the brain sometimes fails to give the normal signal to breathe to the muscles that control your breathing (central sleep apnea). The third type is a combination of the other two types.  HOME CARE  · Do not sleep on your back. Try to sleep on your side.  · Take all medicine as told by your doctor.  · Avoid alcohol, calming medicines (sedatives), and depressant drugs.  · Try to lose weight if you are overweight. Talk to your doctor about a healthy weight goal.  Your doctor may have you use a device that helps to open your airway. It can help you get the air that you need. It is called a positive airway pressure (PAP) device. There are three types of PAP devices:  · Continuous positive airway pressure (CPAP) device.  · Nasal expiratory positive airway pressure (EPAP) device.  · Bilevel positive airway pressure (BPAP) device.  MAKE SURE YOU:  · Understand these instructions.  · Will watch your condition.  · Will get help right away if you are not doing well or get worse.  Document Released: 02/24/2008 Document Revised: 05/03/2012 Document Reviewed: 09/18/2011  [employer]® Patient Information ©2015 [employer]. This information is not intended to replace advice given to you by your health care provider. Make sure you discuss any questions you have with your health care provider.

## 2014-11-13 NOTE — Progress Notes (Signed)
SLEEP MEDICINE CLINIC   Provider:  Melvyn Novas, M D  Referring Provider: Tonye Pearson, MD Primary Care Physician:  Tonye Pearson, MD  Chief Complaint  Patient presents with  . sleep consult    rm 10, new pt, alone    HPI:  Bruce Morales is a 39 y.o. male seen here as a referral from Dr. Merla Riches for sleep evaluation,  Bruce Morales is a Caucasian married right-handed gentleman presents today with excessive daytime sleepiness. His primary care physician, Dr. Merla Riches, had actually referred him out of a concern that this patient may have hypoventilation. Bruce Morales has had chest pains that he describes as severe for several years. He has not found any triggers, the chest pains attack him in the middle of the night, in the midst  of a social  gathering,  but he never got any answer as to why the chest pains are there. He has 2 endoscopies - no result,  He feels that he has restless and fragmented sleep but is neither refreshing nor restoring him. And he appears very sleepy here today. He states that going to the movies as a waste of money acoustical falls asleep throughout any kind of physically non-stimulating situation in the dark.  He reports that in April of this year he suffered a back injury that he had already 2 knee surgeries all this let him to be less exercise tolerance or able to exercise and he gained a lot of weight. He thinks this is the additional problem leading to his sleepiness. He remembers that when he was single he would also fall asleep at a friend's house watching even spot programmer movies. He has often been teased about it. He seems to have been always sleepier than his peers. He has a 35-year-old daughter now and a 54-year-old Step daughter, and the 29-year-old has some nightmares but often wake her up from sleep. But again this is not necessarily an additional external trigger.  The patient goes to bed around 1:59 AM, he likes to watch TV  late in the evening and watches the news. He usually does not watch TV in the bedroom as not to disturb his wife. He gets up at 6 AM to help his wife was a 64-year-old. He does not eat breakfast at home, he will let the dog out, does some of the home finances the necessary phone calls appointments etc. Usually he will have lunch at 12, and he is not working for [employer] since April ( ruptured L4-5 ) . Overall his nocturnal sleep time is less than 4 hours. His home bedroom that he shares with his wife, is cool, quiet, dark. He is usually lethargic after lunch and even was this way doing his work time but since April his sleep habits have certainly changed around. He sleeps for 1-1/2 hours after lunch now since he can. He is snoring, has a dry mouth in am, has headaches and he needs to drink water the first deed in AM to get rid of headaches. He coughs in the morning," a smoker's cough ",  Produces a lot of phlegm  He endorsed the Epworth sleepiness score today at only 12 points, but added that 1 month ago it may have been 18, FSS was 48. The patient is an active smoker, he has avoided drinking alcohol because it makes it even sleepier, he will drink 2 cups of caffeine in coffee in the morning he does not drink other  caffeinated beverages such as soda or iced tea throughout the day.   Review of Systems: Out of a complete 14 system review, the patient complains of only the following symptoms, and all other reviewed systems are negative. He endorsed the Epworth sleepiness score today at only 12 points, but added that 1 month ago it may have been 18, FSS was 48.      History   Social History  . Marital Status: Married    Spouse Name: N/A  . Number of Children: N/A  . Years of Education: N/A   Occupational History  . Not on file.   Social History Main Topics  . Smoking status: Current Every Day Smoker -- 0.50 packs/day for 27 years    Types: Cigarettes  . Smokeless tobacco: Former Neurosurgeon  .  Alcohol Use: Yes     Comment: 04/16/2014 "a 6 pack will last me a month"  . Drug Use: Yes     Comment: 04/16/2014 "in my teens"  . Sexual Activity: Yes   Other Topics Concern  . Not on file   Social History Narrative   Drinks 2 cups of coffee in the morning.    Family History  Problem Relation Age of Onset  . Multiple sclerosis Mother   . Heart failure Maternal Grandfather   . Diabetes Father   . Fibromyalgia Sister   . Schizophrenia Brother   . Stroke Maternal Grandmother   . Diabetes Paternal Grandmother     Past Medical History  Diagnosis Date  . Gall stones   . Chronic lower back pain   . Anginal pain 04/15/2014  . Chronic bronchitis     "probably get it q yr"  . Arthritis     "wrists, knees, lower back" (04/16/2014)  . Chronic lower back pain     Past Surgical History  Procedure Laterality Date  . Cholecystectomy  2011  . Knee arthroscopy Right 2013 X 2  . Esophagogastroduodenoscopy N/A 02/27/2013    Procedure: ESOPHAGOGASTRODUODENOSCOPY (EGD);  Surgeon: Florencia Reasons, MD;  Location: [facility];  Service: Endoscopy;  Laterality: N/A;  . Left heart catheterization with coronary angiogram N/A 04/17/2014    Procedure: LEFT HEART CATHETERIZATION WITH CORONARY ANGIOGRAM;  Surgeon: Ricki Rodriguez, MD;  Location: [facility];  Service: Cardiovascular;  Laterality: N/A;    Current Outpatient Prescriptions  Medication Sig Dispense Refill  . baclofen (LIORESAL) 20 MG tablet Take 20 mg by mouth 3 (three) times daily.  5  . ENDOCET 10-325 MG per tablet Take 1 tablet by mouth 3 (three) times daily as needed.  0  . esomeprazole (NEXIUM) 40 MG capsule Take 40 mg by mouth daily at 12 noon.    Marland Kitchen oxyCODONE-acetaminophen (PERCOCET/ROXICET) 5-325 MG per tablet   0   No current facility-administered medications for this visit.    Allergies as of 11/13/2014 - Review Complete 11/13/2014  Allergen Reaction Noted  . Hydrocodone Hives and Itching 03/15/2013  . Oxycodone   04/15/2014  . Percocet [oxycodone-acetaminophen]  04/15/2014    Vitals: BP 140/84 mmHg  Pulse 82  Resp 20  Ht  (1.753 m)  Wt 224 lb (101.606 kg)  BMI 33.06 kg/m2 Last Weight:  Wt Readings from Last 1 Encounters:  11/13/14 224 lb (101.606 kg)       Last Height:   Ht Readings from Last 1 Encounters:  11/13/14  (1.753 m)    Physical exam:  General: The patient is awake, alert and appears not in acute  distress. The patient is well groomed. Head: Normocephalic, atraumatic. Neck is supple. Mallampati 3   neck circumference: 18  Nasal airflow restricited , TMJ is not  evident . Retrognathia is  not seen.  Cardiovascular:  Regular rate and rhythm, without  murmurs or carotid bruit, and without distended neck veins. Respiratory: Lungs are clear to auscultation. Skin:  Without evidence of edema, or rash Trunk: BMI is elevated and patient  has normal posture.  Neurologic exam : The patient is awake and alert, oriented to place and time.   Memory subjective described as intact. There is a normal attention span & concentration ability.  Speech is fluent without dysarthria, but dysphonia , not  aphasia. Mood and affect are depressed.  Cranial nerves: Pupils are equal and briskly reactive to light. Funduscopic exam without evidence of pallor or edema. Extraocular movements  in vertical and horizontal planes intact and without nystagmus. Visual fields by finger perimetry are intact. Hearing to finger rub intact.  Facial sensation intact to fine touch. Facial motor strength is symmetric and tongue and uvula move midline. His shoulder shrug is symmetric.   Motor exam:   The patient has equal shoulder shrug but she is tender over the right shoulder. He has good tone and his triceps biceps and his grip strength is intact. Hip flexion seems to be intact as well adduction weakness is noted on the right foot drop incomplete at certainly noticeable is noted on the right. 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 He also reported the right leg goes numb by just sitting. He does not need to lift push or pull to have this loss of sensation.  Sensory:  Fine touch, pinprick and vibration were tested in all extremities. Proprioception is  normal.  Coordination: Rapid alternating movements in the fingers/hands is normal. Finger-to-nose maneuver  normal without evidence of ataxia, dysmetria or tremor.  Gait and station: Patient walks without assistive device and is able unassisted to climb up to the exam table. Strength within normal limits. Stance is stable and normal.  Tandem gait is unfragmented. Romberg testing is negative.  Deep tendon reflexes: in the  upper and lower extremities are symmetric and intact.  Babinski maneuver response is downgoing.   Assessment:  After physical and neurologic examination, review of laboratory studies, imaging, neurophysiology testing and pre-existing records, assessment is   1) the patient has a high risk of obstructive sleep apnea, he has a slightly recessed chin he has a large neck circumference he has a high-grade Mallampati. He is currently on narcotic pain medications for his back injury which could also lower his respiratory drive. That may explain some of the headaches he has in daytime and at night.  He has hypersonic at this time excessively daytime sleepy. Part of this is his pain syndrome and the change in day and nighttime habits as he is not going regularly to work. He will have to work on his sleep hygiene I would like him to go to bed before midnight he should aim for about 10 PM he should take his muscle relaxants before going to bed hopefully this will promote 45 hours of consecutive sleep. I urged him to hydrate well some of his headaches may be dehydration symptoms especially now in the heat. I cannot give him good exercise advised as he is currently seen by rehabilitation and physical therapy it may be best if that department  [facility] and exercise regimen for him. I'm concerned about his right-sided foot  drop however and this may be a surgical indentation usually it is less likely for patient to completely recover from a radicalopathy if there is a persistent sensory or motor deficit. His chest pain has not responded to and acids, simple things like Pepcid or times. It comes and attacks and is not related to physical strain or emotional responses. He does not have palpitations with that he is normally not short of breath. The chest pain is described as arising pain that reaches the retromandibular area thing at the sternum and rising through the neck. He had echo and stress test were negative. This could be sternal costal painas palpation does give evidence of trigger point at  right upper sternal rim .   The patient was advised of the nature of the diagnosed sleep disorder , the treatment options and risks for general a health and wellness arising from not treating the condition. Visit duration was 40 minutes.   Plan:  Treatment plan and additional workup :  I will order a split night polysomnography for this patient with CO2 retention measures. I am concerned that without capnography we will not clear if the patient has narcotic-induced hypoventilation -which is a high concern in this case, and that his chest pain also can lead to hypoventilation as the indistinct a febrile no bruits as deep to avoid pain. He will be to come in for an attended study with capnography. He is with [place] [facility] split will be an AHI of 20 and an oxygen score of 4% desaturation.      Porfirio Mylar Audryna Wendt MD  11/13/2014

## 2014-12-11 ENCOUNTER — Telehealth: Payer: Self-pay | Admitting: Neurology

## 2014-12-11 ENCOUNTER — Other Ambulatory Visit: Payer: Self-pay

## 2014-12-11 DIAGNOSIS — R0609 Other forms of dyspnea: Secondary | ICD-10-CM

## 2014-12-11 DIAGNOSIS — M5410 Radiculopathy, site unspecified: Secondary | ICD-10-CM

## 2014-12-11 DIAGNOSIS — E669 Obesity, unspecified: Secondary | ICD-10-CM

## 2014-12-11 DIAGNOSIS — R0683 Snoring: Secondary | ICD-10-CM

## 2014-12-11 DIAGNOSIS — R0689 Other abnormalities of breathing: Secondary | ICD-10-CM

## 2014-12-11 DIAGNOSIS — R0789 Other chest pain: Secondary | ICD-10-CM

## 2014-12-11 NOTE — Telephone Encounter (Signed)
Dr. Vickey Hugerohmeier approved HST.

## 2014-12-11 NOTE — Telephone Encounter (Signed)
PSG denied, need order for HST °

## 2015-01-22 ENCOUNTER — Telehealth: Payer: Self-pay | Admitting: Neurology

## 2015-01-22 NOTE — Telephone Encounter (Signed)
I called patient to schedule his HST and patient states he is not going to do the test.  He has gone to another neurologist and has relief from his back pain so he is sleeping well now.

## 2015-01-22 NOTE — Telephone Encounter (Signed)
Noted! Thank you

## 2015-03-12 ENCOUNTER — Ambulatory Visit (INDEPENDENT_AMBULATORY_CARE_PROVIDER_SITE_OTHER): Payer: 59 | Admitting: Emergency Medicine

## 2015-03-12 ENCOUNTER — Ambulatory Visit: Payer: 59

## 2015-03-12 ENCOUNTER — Ambulatory Visit (INDEPENDENT_AMBULATORY_CARE_PROVIDER_SITE_OTHER): Payer: 59

## 2015-03-12 VITALS — BP 116/74 | HR 81 | Resp 20 | Ht 68.0 in | Wt 228.2 lb

## 2015-03-12 DIAGNOSIS — I493 Ventricular premature depolarization: Secondary | ICD-10-CM

## 2015-03-12 DIAGNOSIS — R0602 Shortness of breath: Secondary | ICD-10-CM

## 2015-03-12 LAB — LIPID PANEL
Cholesterol: 233 mg/dL — ABNORMAL HIGH (ref 125–200)
HDL: 20 mg/dL — ABNORMAL LOW (ref >=40)
LDL Cholesterol: 138 mg/dL — ABNORMAL HIGH (ref <130)
Total CHOL/HDL Ratio: 11.7 Ratio — ABNORMAL HIGH (ref <=5.0)
Triglycerides: 377 mg/dL — ABNORMAL HIGH (ref <150)
VLDL: 75 mg/dL — ABNORMAL HIGH (ref <30)

## 2015-03-12 LAB — POCT CBC
Granulocyte percent: 61.4 %G (ref 37–80)
HCT, POC: 42.6 % — AB (ref 43.5–53.7)
Hemoglobin: 14.5 g/dL (ref 14.1–18.1)
Lymph, poc: 1.9 (ref 0.6–3.4)
MCH, POC: 30 pg (ref 27–31.2)
MCHC: 33.9 g/dL (ref 31.8–35.4)
MCV: 88.5 fL (ref 80–97)
MID (cbc): 0.6 (ref 0–0.9)
MPV: 9.3 fL (ref 0–99.8)
POC Granulocyte: 3.9 (ref 2–6.9)
POC LYMPH PERCENT: 29.5 %L (ref 10–50)
POC MID %: 9.1 %M (ref 0–12)
Platelet Count, POC: 130 10*3/uL — AB (ref 142–424)
RBC: 4.82 M/uL (ref 4.69–6.13)
RDW, POC: 12.6 %
WBC: 6.3 10*3/uL (ref 4.6–10.2)

## 2015-03-12 LAB — BASIC METABOLIC PANEL
BUN: 14 mg/dL (ref 7–25)
CO2: 25 mmol/L (ref 20–31)
Calcium: 9 mg/dL (ref 8.6–10.3)
Chloride: 107 mmol/L (ref 98–110)
Creat: 0.91 mg/dL (ref 0.60–1.35)
Glucose, Bld: 114 mg/dL — ABNORMAL HIGH (ref 65–99)
Potassium: 4.2 mmol/L (ref 3.5–5.3)
Sodium: 141 mmol/L (ref 135–146)

## 2015-03-12 NOTE — Progress Notes (Signed)
Subjective:    Patient ID: Bruce Morales, male    DOB: 04-Feb-1976, 39 y.o.   MRN: [id number]  HPI Patient presents for SOB that has gotten gradually worse over the past 8 months and has progressed to DOE with putting on clothes and brushing teeth. Has a terrible time sleeping secondary to feeling like he cannot breath and uses 3 pillows to sleep, however, states that he actually needs more. Has productive cough and chest pain at times. Chest pain not localized. Denies fever, palpitations, edema, N/V, HA/dizziness. States that he has remote h/o HTN when back pain is increased secondary to bulging disc. Was told in the past that he had a weak aortic valve, but does not have regular f/u with cardiologist. Has smoked tobacco for 27 years and quit yesterday. Denies current illicit drug or alcohol use. Denies h/o anxiety.    Review of Systems  Constitutional: Negative for fever, chills, diaphoresis and fatigue.  HENT: Negative.   Respiratory: Positive for cough and shortness of breath. Negative for apnea, chest tightness and wheezing.   Cardiovascular: Positive for chest pain. Negative for palpitations and leg swelling.  Gastrointestinal: Negative for nausea and vomiting.  Skin: Negative.   Neurological: Negative for dizziness, weakness, light-headedness and headaches.       Objective:   Physical Exam  Constitutional: He is oriented to person, place, and time. He appears well-developed and well-nourished. No distress.  Blood pressure 116/74, pulse 81, resp. rate 20, height  (1.727 m), weight 228 lb 3.2 oz (103.511 kg), SpO2 98 %, peak flow 500 L/min.    HENT:  Head: Normocephalic and atraumatic.  Right Ear: External ear normal.  Left Ear: External ear normal.  Eyes: Conjunctivae are normal. Pupils are equal, round, and reactive to light. Right eye exhibits no discharge. Left eye exhibits no discharge. No scleral icterus.  Neck: No JVD present. Carotid bruit is not present.    Cardiovascular: Normal rate, regular rhythm, normal heart sounds and intact distal pulses.  Exam reveals no gallop and no friction rub.   No murmur heard. Pulmonary/Chest: Effort normal and breath sounds normal. No apnea. No respiratory distress. He has no decreased breath sounds. He has no wheezes. He has no rhonchi. He exhibits no tenderness.  Abdominal: Soft. Bowel sounds are normal. He exhibits no distension and no mass. There is tenderness in the epigastric area and periumbilical area. There is no rebound and no guarding. No hernia.  Musculoskeletal: He exhibits no edema.  Lymphadenopathy:    He has no cervical adenopathy.  Neurological: He is alert and oriented to person, place, and time.  Skin: He is not diaphoretic.  Psychiatric: He has a normal mood and affect. His behavior is normal. Judgment and thought content normal.   EKG by Dr. Conley Rolls: PVCs present.   [facility] reading (PRIMARY) by  Dr. Cleta Alberts. No acute cardiopulmonary d/o.  Results for orders placed or performed in visit on 03/12/15  POCT CBC  Result Value Ref Range   WBC 6.3 4.6 - 10.2 K/uL   Lymph, poc 1.9 0.6 - 3.4   POC LYMPH PERCENT 29.5 10 - 50 %L   MID (cbc) 0.6 0 - 0.9   POC MID % 9.1 0 - 12 %M   POC Granulocyte 3.9 2 - 6.9   Granulocyte percent 61.4 37 - 80 %G   RBC 4.82 4.69 - 6.13 M/uL   Hemoglobin 14.5 14.1 - 18.1 g/dL   HCT, POC 08.6 (A) 57.8 - 53.7 %  MCV 88.5 80 - 97 fL   MCH, POC 30.0 27 - 31.2 pg   MCHC 33.9 31.8 - 35.4 g/dL   RDW, POC 82.912.6 %   Platelet Count, POC 130 (A) 142 - 424 K/uL   MPV 9.3 0 - 99.8 fL       Assessment & Plan:  1. SOB (shortness of breath) 2. PVC (premature ventricular contraction) Will send to pulm for COPD vs emphysema work up.  - EKG 12-Lead - DG Chest 2 View; Future - POCT CBC - Basic metabolic panel - Lipid panel - TSH - Brain natriuretic peptide - Ambulatory referral to Pulmonology   Janan Ridgeishira Marea Reasner PA-C  [facility] 03/12/2015 6:33 PM

## 2015-03-13 LAB — TSH: TSH: 1.105 u[IU]/mL (ref 0.350–4.500)

## 2015-03-13 LAB — BRAIN NATRIURETIC PEPTIDE: Brain Natriuretic Peptide: 5.4 pg/mL (ref 0.0–100.0)

## 2015-03-17 ENCOUNTER — Ambulatory Visit (INDEPENDENT_AMBULATORY_CARE_PROVIDER_SITE_OTHER): Payer: 59 | Admitting: Internal Medicine

## 2015-03-17 ENCOUNTER — Encounter: Payer: Self-pay | Admitting: Internal Medicine

## 2015-03-17 VITALS — BP 128/80 | HR 63 | Ht 68.0 in | Wt 236.4 lb

## 2015-03-17 DIAGNOSIS — R05 Cough: Secondary | ICD-10-CM

## 2015-03-17 DIAGNOSIS — R0600 Dyspnea, unspecified: Secondary | ICD-10-CM

## 2015-03-17 DIAGNOSIS — R059 Cough, unspecified: Secondary | ICD-10-CM

## 2015-03-17 MED ORDER — FAMOTIDINE 20 MG PO TABS: ORAL_TABLET | ORAL | Status: DC

## 2015-03-17 MED ORDER — PANTOPRAZOLE SODIUM 40 MG PO TBEC: 40.0000 mg | DELAYED_RELEASE_TABLET | Freq: Every day | ORAL | Status: DC

## 2015-03-17 NOTE — Progress Notes (Signed)
Subjective:    Patient ID: Bruce Morales, male    DOB: 1975-09-06,    MRN: [id number]  HPI  39 yowm quit smoking 03/11/15 p started smoking age 89 with good ex tolerance until around 2012 with gradual worsening best days since then referred to pulmonary clinic 03/17/2015 by Dr Mitzi DavenportBrewington     03/17/2015 1st [facility] office visit/ Jamacia Jester   Chief Complaint  Patient presents with  . PULMONARY CONSULT    SOB. Pt states that he recently stopped smoking. Pt c.o of SOB, wheeze, angina, and cough. Pt notices symptoms more at night when sleeping and has not had a sleep study. Pt does not have inhalers.   last time really did any aerobics was 2009 with worse breathing vs peers even then at wt 180  Doe = MMRC2 = can't walk a nl pace on a flat grade s sob/ back pain / never tried inhaler Main issue is can't lie down flat p few min with sense of throat gurgling and smothering assoc with sense of nasal congestion also  No obvious other patterns in day to day or daytime variabilty or assoc  chest tightness,  overt  hb symptoms. No unusual exp hx or h/o childhood pna/ asthma or knowledge of premature birth.  He does not have classically pleuritic or ex cp but has been told in past he has angina.  Sleeping ok without nocturnal  or early am exacerbation  of respiratory  c/o's or need for noct saba. Also denies any obvious fluctuation of symptoms with weather or environmental changes or other aggravating or alleviating factors except as outlined above   Current Medications, Allergies, Complete Past Medical History, Past Surgical History, Family History, and Social History were reviewed in [facility] electronic medical record.              Review of Systems  Constitutional: Negative.  Negative for fever and unexpected weight change.  HENT: Positive for congestion, sinus pressure, sore throat and trouble swallowing. Negative for dental problem, ear pain, nosebleeds, postnasal drip,  rhinorrhea and sneezing.   Eyes: Negative.  Negative for redness and itching.  Respiratory: Positive for cough, shortness of breath and wheezing. Negative for chest tightness.   Cardiovascular: Positive for chest pain. Negative for palpitations and leg swelling.  Gastrointestinal: Negative.  Negative for nausea and vomiting.  Endocrine: Negative.   Genitourinary: Negative.  Negative for dysuria.  Musculoskeletal: Positive for back pain. Negative for joint swelling.  Skin: Negative.  Negative for rash.  Allergic/Immunologic: Negative.   Neurological: Positive for dizziness, light-headedness and headaches.  Hematological: Negative.  Does not bruise/bleed easily.  Psychiatric/Behavioral: Negative.  Negative for dysphoric mood. The patient is not nervous/anxious.        Objective:   Physical Exam  amb wf nad  Wt Readings from Last 3 Encounters:  03/17/15 236 lb 6.4 oz (107.23 kg)  03/12/15 228 lb 3.2 oz (103.511 kg)  11/13/14 224 lb (101.606 kg)    Vital signs reviewed  HEENT: nl dentition, turbinates, and orophanx. Nl external ear canals without cough reflex   NECK :  without JVD/Nodes/TM/ nl carotid upstrokes bilaterally   LUNGS: no acc muscle use, clear to A and P bilaterally without cough on insp or exp maneuvers   CV:  RRR  no s3 or murmur or increase in P2, no edema   ABD:  soft and nontender with nl excursion in the supine position. No bruits or organomegaly, bowel sounds nl  MS:  warm without deformities, calf tenderness, cyanosis or clubbing  SKIN: warm and dry without lesions    NEURO:  alert, approp, no deficits    I personally reviewed images and agree with radiology impression as follows:  CXR:   03/12/15  No edema or consolidation.      Labs reviewed:      Chemistry      Component Value Date/Time   NA 141 03/12/2015 1633   K 4.2 03/12/2015 1633   CL 107 03/12/2015 1633   CO2 25 03/12/2015 1633   BUN 14 03/12/2015 1633   CREATININE 0.91  03/12/2015 1633   CREATININE 1.40* 07/28/2014 1015      Component Value Date/Time   CALCIUM 9.0 03/12/2015 1633   ALKPHOS 45 09/13/2014 1210   AST 24 09/13/2014 1210   ALT 49 09/13/2014 1210   BILITOT 0.9 09/13/2014 1210        Lab Results  Component Value Date   WBC 6.3 03/12/2015   HGB 14.5 03/12/2015   HCT 42.6* 03/12/2015   MCV 88.5 03/12/2015   PLT 140* 07/28/2014     BNP  5.4   Lab Results  Component Value Date   TSH 1.105 03/12/2015                Assessment & Plan:

## 2015-03-17 NOTE — Patient Instructions (Addendum)
Pantoprazole (protonix) 40 mg   Take  30-60 min before first meal of the day and Pepcid (famotidine)  20 mg one @  bedtime until return to office - this is the best way to tell whether stomach acid is contributing to your problem.    GERD (REFLUX)  is an extremely common cause of respiratory symptoms just like yours , many times with no obvious heartburn at all.    It can be treated with medication, but also with lifestyle changes including elevation of the head of your bed (ideally with 6 inch  bed blocks),  Smoking cessation, avoidance of late meals, excessive alcohol, and avoid fatty foods, chocolate, peppermint, colas, red wine, and acidic juices such as orange juice.  NO MINT OR MENTHOL PRODUCTS SO NO COUGH DROPS  USE SUGARLESS CANDY INSTEAD (Jolley ranchers or Stover's or Life Savers) or even ice chips will also do - the key is to swallow to prevent all throat clearing. NO OIL BASED VITAMINS - use powdered substitutes.   The most important aspect of your care at this point is maintain off all cigarette smoking   Please schedule a follow up office visit in 4 weeks, sooner if needed with pfts

## 2015-03-19 ENCOUNTER — Telehealth: Payer: Self-pay | Admitting: Physician Assistant

## 2015-03-19 NOTE — Telephone Encounter (Signed)
Pt called about labs. Relayed below message

## 2015-03-19 NOTE — Telephone Encounter (Signed)
Lipids elevated, however, can be remedied with better diet and cutting back on processed foods, trans fat, and fried foods. Rest of labs normal.

## 2015-03-20 ENCOUNTER — Encounter: Payer: Self-pay | Admitting: Internal Medicine

## 2015-03-20 DIAGNOSIS — R0600 Dyspnea, unspecified: Secondary | ICD-10-CM | POA: Insufficient documentation

## 2015-03-20 NOTE — Assessment & Plan Note (Signed)
Body mass index is 35.95 kg/(m^2).  Lab Results  Component Value Date   TSH 1.105 03/12/2015     Contributing to gerd tendency/ doe/reviewed the need and the process to achieve and maintain neg calorie balance which may be difficult with orthopedic constraints and in setting of just quit smoking  > defer f/u primary care including intermittently monitoring thyroid status

## 2015-03-20 NOTE — Assessment & Plan Note (Addendum)
Symptoms are   disproportionate to objective findings and not clear this is a lung problem but pt does appear to have difficult airway management issues.  DDX of  difficult airways management all start with A and  include Adherence, Ace Inhibitors, Acid Reflux, Active Sinus Disease, Alpha 1 Antitripsin deficiency, Anxiety masquerading as Airways dz,  ABPA,  allergy(esp in young), Aspiration (esp in elderly), Adverse effects of meds,  Active smokers, A bunch of PE's (a small clot burden can't cause this syndrome unless there is already severe underlying pulm or vascular dz with poor reserve) plus two Bs  = Bronchiectasis and Beta blocker use..and one C= CHF  Adherence is always the initial "prime suspect" and is a multilayered concern that requires a "trust but verify" approach in every patient - starting with knowing how to use medications, especially inhalers, correctly, keeping up with refills and understanding the fundamental difference between maintenance and prns vs those medications only taken for a very short course and then stopped and not refilled.   ? Acid (or non-acid) GERD > always difficult to exclude as up to 75% of pts in some series report no assoc GI/ Heartburn symptoms> rec max (24h)  acid suppression and diet restrictions/ reviewed and instructions given in writing.   ? Active smoking > > denies  I reviewed the Fletcher curve with the patient that basically indicates  if you quit smoking when your best day FEV1 is still well preserved (as is clearly  the case here)  it is highly unlikely you will progress to severe disease and informed the patient there was no medication on the market that has proven to alter the curve/ its downward trajectory  or the likelihood of progression of their disease.  Therefore stopping smoking and maintaining abstinence is the most important aspect of care, not choice of inhalers or for that matter, doctors.    ? Anxiety/deconditioning/ obesity probably all  playing a role here but dx of exclusion  ? CHF > excluded with bnp <<100  I had an extended discussion with the patient reviewing all relevant studies completed to date and  lasting 35minutes of a 60 minute visit    Each maintenance medication was reviewed in detail including most importantly the difference between maintenance and prns and under what circumstances the prns are to be triggered using an action plan format that is not reflected in the computer generated alphabetically organized AVS.    Please see instructions for details which were reviewed in writing and the patient given a copy highlighting the part that I personally wrote and discussed at today's ov.    .Marland Kitchen

## 2015-03-20 NOTE — Assessment & Plan Note (Signed)
Upper airway cough syndrome, so named because it's frequently impossible to sort out how much is  CR/sinusitis with freq throat clearing (which can be related to primary GERD)   vs  causing  secondary (" extra esophageal")  GERD from wide swings in gastric pressure that occur with throat clearing, often  promoting self use of mint and menthol lozenges that reduce the lower esophageal sphincter tone and exacerbate the problem further in a cyclical fashion.   These are the same pts (now being labeled as having "irritable larynx syndrome" by some cough centers) who not infrequently have a history of having failed to tolerate ace inhibitors,  dry powder inhalers or biphosphonates or report having atypical reflux symptoms that don't respond to standard doses of PPI , and are easily confused as having aecopd or asthma flares by even experienced allergists/ pulmonologists.   For now will try max rx for gerd then regroup

## 2015-03-24 ENCOUNTER — Ambulatory Visit (INDEPENDENT_AMBULATORY_CARE_PROVIDER_SITE_OTHER): Payer: 59 | Admitting: Physician Assistant

## 2015-03-24 ENCOUNTER — Ambulatory Visit (INDEPENDENT_AMBULATORY_CARE_PROVIDER_SITE_OTHER): Payer: 59

## 2015-03-24 VITALS — BP 110/70 | HR 72 | Temp 97.4°F | Resp 16 | Ht 69.25 in | Wt 231.0 lb

## 2015-03-24 DIAGNOSIS — F418 Other specified anxiety disorders: Secondary | ICD-10-CM

## 2015-03-24 DIAGNOSIS — R062 Wheezing: Secondary | ICD-10-CM

## 2015-03-24 DIAGNOSIS — J209 Acute bronchitis, unspecified: Secondary | ICD-10-CM

## 2015-03-24 MED ORDER — BENZONATATE 100 MG PO CAPS: 100.0000 mg | ORAL_CAPSULE | Freq: Three times a day (TID) | ORAL | Status: DC | PRN

## 2015-03-24 MED ORDER — AMOXICILLIN-POT CLAVULANATE 875-125 MG PO TABS: 1.0000 | ORAL_TABLET | Freq: Two times a day (BID) | ORAL | Status: DC

## 2015-03-24 MED ORDER — ALBUTEROL SULFATE (2.5 MG/3ML) 0.083% IN NEBU
2.5000 mg | INHALATION_SOLUTION | Freq: Once | RESPIRATORY_TRACT | Status: AC
  Administered 2015-03-24: 2.5 mg via RESPIRATORY_TRACT

## 2015-03-24 MED ORDER — IPRATROPIUM BROMIDE 0.03 % NA SOLN: 2.0000 | Freq: Two times a day (BID) | NASAL | Status: DC

## 2015-03-24 MED ORDER — ALBUTEROL SULFATE HFA 108 (90 BASE) MCG/ACT IN AERS: 2.0000 | INHALATION_SPRAY | RESPIRATORY_TRACT | Status: DC | PRN

## 2015-03-24 MED ORDER — IPRATROPIUM BROMIDE 0.02 % IN SOLN
0.5000 mg | Freq: Once | RESPIRATORY_TRACT | Status: AC
  Administered 2015-03-24: 0.5 mg via RESPIRATORY_TRACT

## 2015-03-24 NOTE — Patient Instructions (Signed)

## 2015-03-24 NOTE — Progress Notes (Signed)
Subjective:    Patient ID: Bruce Morales, male    DOB: 02/08/1976, 39 y.o.   MRN: [id number]  HPI Patient presents for productive cough that has been present for the past 5 days. Sputum is green in color and when blows nose has some blood in mucus. Additionally endorses chills, sweating, not able to take a deep breath, rhinorrhea, congestion, wheezing, and fatigue. States that cough is keeping him up at night. Denies HA, sinus pressure, fever, sore throat. Daughter was dx and treated from pneumonia this past week and feels like he is now following same timeline.  Had appt with pulmonologist last week and is now being treated with silent reflux. Patient does not see a change in frequency of  SOB episodes that he was having when seen here 03/12/15. No associated chest pain. Since he has had cough does not feel there has been increase of episodes.   Is now 13 days out from not smoking after smoking for the past 27 years. Signed up for smoking cessation link. Has not had desire to smoke while sick, but did previous when he was angry since that has been his coping mechanism.   States that with the SOB and his pending back surgery (04/17/15) has been so anxious and depressed. Found himself crying for no reason last week and just felt defeated. Still engaging family and friends. Does not feel hopeless. Is looking forward to being healthy again. No SI. Feels convinced in his faith not to use anti-depressants secondary to ADHD meds being pushed on him when he was younger. Does not want to see a therapist at this time bc feels that he has a good support system and can call any of his friends if he needs to "talk it out". Also feels that he can talk to his wife.   Med allergy to hydrocodone and oxycodone is occasional itching.   Review of Systems As noted above.    Objective:   Physical Exam  Constitutional: He is oriented to person, place, and time. He appears well-developed and well-nourished. No  distress.  Blood pressure 110/70, pulse 72, temperature 97.4 F (36.3 C), temperature source Oral, resp. rate 16, height 5' 9.25" (1.759 m), weight 231 lb (104.781 kg), SpO2 97 %.  HENT:  Head: Normocephalic and atraumatic.  Right Ear: Tympanic membrane, external ear and ear canal normal.  Left Ear: Tympanic membrane, external ear and ear canal normal.  Nose: Rhinorrhea present. No mucosal edema. Right sinus exhibits no maxillary sinus tenderness and no frontal sinus tenderness. Left sinus exhibits no maxillary sinus tenderness and no frontal sinus tenderness.  Mouth/Throat: Uvula is midline, oropharynx is clear and moist and mucous membranes are normal. No oropharyngeal exudate, posterior oropharyngeal edema or posterior oropharyngeal erythema.  Eyes: Conjunctivae are normal. Pupils are equal, round, and reactive to light. Right eye exhibits no discharge. Left eye exhibits no discharge. No scleral icterus.  Neck: Normal range of motion. Neck supple. No thyromegaly present.  Cardiovascular: Normal rate, regular rhythm and normal heart sounds.  Exam reveals no gallop and no friction rub.   No murmur heard. Pulmonary/Chest: Effort normal. No respiratory distress. He has no decreased breath sounds. He has wheezes in the right upper field, the right middle field, the right lower field and the left upper field. He has rhonchi in the right middle field. He has no rales. He exhibits no tenderness.  Abdominal: Soft. Bowel sounds are normal. He exhibits no distension and no mass. There is no  tenderness. There is no rebound and no guarding.  Lymphadenopathy:    He has no cervical adenopathy.  Neurological: He is alert and oriented to person, place, and time.  Skin: Skin is warm and dry. No rash noted. He is not diaphoretic. No erythema. No pallor.  Psychiatric: He has a normal mood and affect. His behavior is normal. Thought content normal.    [facility] reading (PRIMARY) by  Dr. Patsy Lageropland. Question right lower  lobe infiltrate.      Assessment & Plan:  1. Acute bronchitis, unspecified organism 2. Wheezing Plenty of fluid and rest. - DG Chest 2 View; Future - albuterol (PROVENTIL) (2.5 MG/3ML) 0.083% nebulizer solution 2.5 mg; Take 3 mLs (2.5 mg total) by nebulization once. - ipratropium (ATROVENT) nebulizer solution 0.5 mg; Take 2.5 mLs (0.5 mg total) by nebulization once. - amoxicillin-clavulanate (AUGMENTIN) 875-125 MG tablet; Take 1 tablet by mouth 2 (two) times daily.  Dispense: 20 tablet; Refill: 0 - albuterol (PROVENTIL HFA;VENTOLIN HFA) 108 (90 BASE) MCG/ACT inhaler; Inhale 2 puffs into the lungs every 4 (four) hours as needed for wheezing or shortness of breath (cough, shortness of breath or wheezing.).  Dispense: 1 Inhaler; Refill: 1 - benzonatate (TESSALON) 100 MG capsule; Take 1-2 capsules (100-200 mg total) by mouth 3 (three) times daily as needed for cough.  Dispense: 40 capsule; Refill: 0 - ipratropium (ATROVENT) 0.03 % nasal spray; Place 2 sprays into both nostrils 2 (two) times daily.  Dispense: 30 mL; Refill: 0  3. Situational anxiety If changes his mind will refer to psychologist or initiate Celexa. Discussed the advantages of therapy.    Janan Ridgeishira Latise Dilley PA-C  [facility] 03/24/2015 4:43 PM

## 2015-03-26 ENCOUNTER — Telehealth: Payer: Self-pay

## 2015-03-26 MED ORDER — CODEINE POLT-CHLORPHEN POLT ER 14.7-2.8 MG/5ML PO SUER: 10.0000 mL | Freq: Two times a day (BID) | ORAL | Status: DC | PRN

## 2015-03-26 NOTE — Telephone Encounter (Signed)
Still having a bad cough at night, but mucus seems to be moving more. Would like to try Martin[place]uzistra. Is ok with itching and will take benadryl to combat. Warning signs discussed.

## 2015-03-26 NOTE — Telephone Encounter (Signed)
Pt states his mucus hasn't changed colors and also the Dr said they would call him in some CODEINE COUGH SYRUP if needed and he would like to have some called in Please call [phone]    [facility] [address]

## 2015-04-03 DIAGNOSIS — Z0271 Encounter for disability determination: Secondary | ICD-10-CM

## 2015-04-09 ENCOUNTER — Ambulatory Visit (INDEPENDENT_AMBULATORY_CARE_PROVIDER_SITE_OTHER): Payer: 59 | Admitting: Physician Assistant

## 2015-04-09 ENCOUNTER — Other Ambulatory Visit: Payer: Self-pay | Admitting: Internal Medicine

## 2015-04-09 VITALS — BP 120/78 | HR 79 | Temp 98.4°F | Resp 20 | Ht 69.0 in | Wt 235.0 lb

## 2015-04-09 DIAGNOSIS — F411 Generalized anxiety disorder: Secondary | ICD-10-CM | POA: Diagnosis not present

## 2015-04-09 DIAGNOSIS — R0600 Dyspnea, unspecified: Secondary | ICD-10-CM

## 2015-04-09 MED ORDER — CITALOPRAM HYDROBROMIDE 10 MG PO TABS: 10.0000 mg | ORAL_TABLET | Freq: Every day | ORAL | Status: DC

## 2015-04-09 MED ORDER — LORAZEPAM 0.5 MG PO TABS: 0.5000 mg | ORAL_TABLET | Freq: Two times a day (BID) | ORAL | Status: DC | PRN

## 2015-04-09 NOTE — Patient Instructions (Signed)
Generalized Anxiety Disorder Generalized anxiety disorder (GAD) is a mental disorder. It interferes with life functions, including relationships, work, and school. GAD is different from normal anxiety, which everyone experiences at some point in their lives in response to specific life events and activities. Normal anxiety actually helps us prepare for and get through these life events and activities. Normal anxiety goes away after the event or activity is over.  GAD causes anxiety that is not necessarily related to specific events or activities. It also causes excess anxiety in proportion to specific events or activities. The anxiety associated with GAD is also difficult to control. GAD can vary from mild to severe. People with severe GAD can have intense waves of anxiety with physical symptoms (panic attacks).  SYMPTOMS The anxiety and worry associated with GAD are difficult to control. This anxiety and worry are related to many life events and activities and also occur more days than not for 6 months or longer. People with GAD also have three or more of the following symptoms (one or more in children):  Restlessness.   Fatigue.  Difficulty concentrating.   Irritability.  Muscle tension.  Difficulty sleeping or unsatisfying sleep. DIAGNOSIS GAD is diagnosed through an assessment by your health care provider. Your health care provider will ask you questions aboutyour mood,physical symptoms, and events in your life. Your health care provider may ask you about your medical history and use of alcohol or drugs, including prescription medicines. Your health care provider may also do a physical exam and blood tests. Certain medical conditions and the use of certain substances can cause symptoms similar to those associated with GAD. Your health care provider may refer you to a mental health specialist for further evaluation. TREATMENT The following therapies are usually used to treat GAD:    Medication. Antidepressant medication usually is prescribed for long-term daily control. Antianxiety medicines may be added in severe cases, especially when panic attacks occur.   Talk therapy (psychotherapy). Certain types of talk therapy can be helpful in treating GAD by providing support, education, and guidance. A form of talk therapy called cognitive behavioral therapy can teach you healthy ways to think about and react to daily life events and activities.  Stress managementtechniques. These include yoga, meditation, and exercise and can be very helpful when they are practiced regularly. A mental health specialist can help determine which treatment is best for you. Some people see improvement with one therapy. However, other people require a combination of therapies.   This information is not intended to replace advice given to you by your health care provider. Make sure you discuss any questions you have with your health care provider.   Document Released: 09/11/2012 Document Revised: 06/07/2014 Document Reviewed: 09/11/2012 Elsevier Interactive Patient Education 2016 Elsevier Inc.  
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
## 2015-04-10 ENCOUNTER — Ambulatory Visit (INDEPENDENT_AMBULATORY_CARE_PROVIDER_SITE_OTHER): Payer: 59 | Admitting: Internal Medicine

## 2015-04-10 DIAGNOSIS — R0600 Dyspnea, unspecified: Secondary | ICD-10-CM

## 2015-04-10 LAB — PULMONARY FUNCTION TEST
DL/VA % pred: 87 %
DL/VA: 3.99 ml/min/mmHg/L
DLCO unc % pred: 69 %
DLCO unc: 20.74 ml/min/mmHg
FEF 25-75 Post: 6.03 L/sec
FEF 25-75 Pre: 5.95 L/sec
FEF2575-%Change-Post: 1 %
FEF2575-%Pred-Post: 153 %
FEF2575-%Pred-Pre: 151 %
FEV1-%Change-Post: 0 %
FEV1-%Pred-Post: 94 %
FEV1-%Pred-Pre: 94 %
FEV1-Post: 3.83 L
FEV1-Pre: 3.8 L
FEV1FVC-%Change-Post: -2 %
FEV1FVC-%Pred-Pre: 112 %
FEV6-%Change-Post: 3 %
FEV6-%Pred-Post: 88 %
FEV6-%Pred-Pre: 85 %
FEV6-Post: 4.33 L
FEV6-Pre: 4.2 L
FEV6FVC-%Pred-Post: 102 %
FEV6FVC-%Pred-Pre: 102 %
FVC-%Change-Post: 3 %
FVC-%Pred-Post: 86 %
FVC-%Pred-Pre: 83 %
FVC-Post: 4.33 L
FVC-Pre: 4.2 L
Post FEV1/FVC ratio: 88 %
Post FEV6/FVC ratio: 100 %
Pre FEV1/FVC ratio: 90 %
Pre FEV6/FVC Ratio: 100 %
RV % pred: 118 %
RV: 1.97 L
TLC % pred: 95 %
TLC: 6.24 L

## 2015-04-10 NOTE — Progress Notes (Signed)
PFT done today. 

## 2015-04-11 ENCOUNTER — Ambulatory Visit (INDEPENDENT_AMBULATORY_CARE_PROVIDER_SITE_OTHER): Payer: 59 | Admitting: Internal Medicine

## 2015-04-11 ENCOUNTER — Encounter: Payer: Self-pay | Admitting: Internal Medicine

## 2015-04-11 VITALS — BP 122/66 | HR 70 | Ht 68.0 in | Wt 235.2 lb

## 2015-04-11 DIAGNOSIS — R0600 Dyspnea, unspecified: Secondary | ICD-10-CM | POA: Diagnosis not present

## 2015-04-11 NOTE — Progress Notes (Signed)
Subjective:    Patient ID: Bruce Morales, male    DOB: 21-Aug-1975,    MRN: [id number]    Brief patient profile:  39 yowm quit smoking 03/11/15 p started smoking age 89 with good ex tolerance until around 2012 with gradual worsening best days since then referred to pulmonary clinic 03/17/2015 by Dr Mitzi DavenportBrewington    History of Present Illness  03/17/2015 1st [facility] office visit/ Terianne Thaker   Chief Complaint  Patient presents with  . PULMONARY CONSULT    SOB. Pt states that he recently stopped smoking. Pt c.o of SOB, wheeze, angina, and cough. Pt notices symptoms more at night when sleeping and has not had a sleep study. Pt does not have inhalers.   last time really did any aerobics was 2009 with worse breathing vs peers even then at wt 180  Doe = MMRC2 = can't walk a nl pace on a flat grade s sob/ back pain / never tried inhaler Main issue is can't lie down flat p few min with sense of throat gurgling and smothering assoc with sense of nasal congestion also rec Pantoprazole (protonix) 40 mg   Take  30-60 min before first meal of the day and Pepcid (famotidine)  20 mg one @  bedtime until return to office - this is the best way to tell whether stomach acid is contributing to your problem.   GERD diet  The most important aspect of your care at this point is maintain off all cigarette smoking    04/11/2015  f/u ov/Elleanna Melling re: unexplained sob / cp  - quit smoking 03/11/15   Chief Complaint  Patient presents with  . Follow-up    Reports breathing is better but still wheezing and has hard time to take a deep breath.      last  Had good ex tol 2011  At 175-180 and downhill since then with doe Cp's all started p  GB surgery 2009 / attacks of pain every other day plus back pain  Cp sometimes rad to neck for years and not related to exertion      No obvious day to day or daytime variability or assoc chronic cough or  chest tightness,  or overt sinus or hb symptoms. No unusual exp hx or  h/o childhood pna/ asthma or knowledge of premature birth.  Sleeping ok without nocturnal  or early am exacerbation  of respiratory  c/o's or need for noct saba. Also denies any obvious fluctuation of symptoms with weather or environmental changes or other aggravating or alleviating factors except as outlined above   Current Medications, Allergies, Complete Past Medical History, Past Surgical History, Family History, and Social History were [facility] electronic [facility].  ROS  The following are not active complaints unless bolded sore throat, dysphagia, dental problems, itching, sneezing,  nasal congestion or excess/ purulent secretions, ear ache,   fever, chills, sweats, unintended wt loss, classically pleuritic or exertional cp, hemoptysis,  orthopnea pnd or leg swelling, presyncope, palpitations, abdominal pain, anorexia, nausea, vomiting, diarrhea  or change in bowel or bladder habits, change in stools or urine, dysuria,hematuria,  rash, arthralgias, visual complaints, headache, numbness, weakness or ataxia or problems with walking or coordination,  change in mood/affect or memory.                         Objective:   Physical Exam  amb wf nad  04/11/2015  235   03/17/15 236 lb 6.4 oz (107.23 kg)  03/12/15 228 lb 3.2 oz (103.511 kg)  11/13/14 224 lb (101.606 kg)    Vital signs reviewed  HEENT: nl dentition, turbinates, and orophanx. Nl external ear canals without cough reflex   NECK :  without JVD/Nodes/TM/ nl carotid upstrokes bilaterally   LUNGS: no acc muscle use, clear to A and P bilaterally without cough on insp or exp maneuvers   CV:  RRR  no s3 or murmur or increase in P2, no edema   ABD:  soft and nontender with nl excursion in the supine position. No bruits or organomegaly, bowel sounds nl  MS:  warm without deformities, calf tenderness, cyanosis or clubbing  SKIN: warm and dry without lesions    NEURO:  alert, approp, no  deficits    I personally reviewed images and agree with radiology impression as follows:  CXR:   03/12/15  No edema or consolidation.      Labs reviewed:      Chemistry      Component Value Date/Time   NA 141 03/12/2015 1633   K 4.2 03/12/2015 1633   CL 107 03/12/2015 1633   CO2 25 03/12/2015 1633   BUN 14 03/12/2015 1633   CREATININE 0.91 03/12/2015 1633   CREATININE 1.40* 07/28/2014 1015      Component Value Date/Time   CALCIUM 9.0 03/12/2015 1633   ALKPHOS 45 09/13/2014 1210   AST 24 09/13/2014 1210   ALT 49 09/13/2014 1210   BILITOT 0.9 09/13/2014 1210        Lab Results  Component Value Date   WBC 6.3 03/12/2015   HGB 14.5 03/12/2015   HCT 42.6* 03/12/2015   MCV 88.5 03/12/2015   PLT 140* 07/28/2014     BNP  5.4   Lab Results  Component Value Date   TSH 1.105 03/12/2015                Assessment & Plan:   Outpatient Encounter Prescriptions as of 04/11/2015  Medication Sig  . citalopram (CELEXA) 10 MG tablet Take 1 tablet (10 mg total) by mouth daily.  . DUEXIS 800-26.6 MG TABS Take 1 tablet by mouth 3 (three) times daily as needed.  . famotidine (PEPCID) 20 MG tablet One at bedtime  . gabapentin (NEURONTIN) 300 MG capsule Take 1 capsule by mouth 3 (three) times daily.  Marland Kitchen HYDROmorphone (DILAUDID) 2 MG tablet Take 2 mg by mouth every 6 (six) hours as needed for severe pain.  Marland Kitchen ipratropium (ATROVENT) 0.03 % nasal spray Place 2 sprays into both nostrils 2 (two) times daily.  Marland Kitchen LORazepam (ATIVAN) 0.5 MG tablet Take 1 tablet (0.5 mg total) by mouth 2 (two) times daily as needed for anxiety.  . meloxicam (MOBIC) 15 MG tablet Take 15 mg by mouth daily.  . methocarbamol (ROBAXIN) 750 MG tablet Take 750 mg by mouth 3 (three) times daily.  . pantoprazole (PROTONIX) 40 MG tablet Take 1 tablet (40 mg total) by mouth daily. Take 30-60 min before first meal of the day  . albuterol (PROVENTIL HFA;VENTOLIN HFA) 108 (90 BASE) MCG/ACT inhaler Inhale 2 puffs into  the lungs every 4 (four) hours as needed for wheezing or shortness of breath (cough, shortness of breath or wheezing.).  . [DISCONTINUED] amoxicillin-clavulanate (AUGMENTIN) 875-125 MG tablet Take 1 tablet by mouth 2 (two) times daily. (Patient not taking: Reported on 04/09/2015)  . [DISCONTINUED] baclofen (LIORESAL) 20 MG tablet Take 20 mg by mouth 3 (  three) times daily.  . [DISCONTINUED] benzonatate (TESSALON) 100 MG capsule Take 1-2 capsules (100-200 mg total) by mouth 3 (three) times daily as needed for cough. (Patient not taking: Reported on 04/09/2015)  . [DISCONTINUED] Codeine Polt-Chlorphen Polt ER (TUZISTRA XR) 14.7-2.8 MG/5ML SUER Take 10 mLs by mouth every 12 (twelve) hours as needed (for cough). (Patient not taking: Reported on 04/09/2015)   No facility-administered encounter medications on file as of 04/11/2015.

## 2015-04-11 NOTE — Patient Instructions (Signed)
Treatment of your chest pain  Which may just be gas related consists of avoiding foods that cause gas (especially beans, boiled eggs  and raw vegetables like spinach and salads)  and citrucel 1 heaping tsp twice daily with a large glass of water.  Pain should improve w/in 2 weeks and if not then consider further GI work up but I would let your primary doctor co-ordinate your referrals to specialists  You have no copd and never will unless you resume smoking > no need for pulmonary follow up

## 2015-04-13 ENCOUNTER — Encounter: Payer: Self-pay | Admitting: Internal Medicine

## 2015-04-13 NOTE — Assessment & Plan Note (Addendum)
-   PFT's  04/10/2015  FEV1 3.83 (94 % ) ratio 88  p 0 % improvement from saba with DLCO  69 % corrects to 87 % for alv volume  - no curve at all to f/v loop  - 04/11/2015  Walked RA x 3 laps @ 185 ft each stopped due to  Dizzy/ back pain, no sob or desats at a moderate pace   I had an extended final summary discussion with the patient reviewing all relevant studies completed to date and  lasting 15 to 20 minutes of a 25 minute visit on the following issues:    1) no indication at all of a lung problem here despite smoking hx  I reviewed the Fletcher curve with the patient that basically indicates  if you quit smoking when your best day FEV1 is still well preserved (as is clearly  the case here)  it is highly unlikely you will progress to severe disease and informed the patient there was no medication on the market that has proven to alter the curve/ its downward trajectory  or the likelihood of progression of their disease.  Therefore stopping smoking and maintaining abstinence is the most important aspect of care, not choice of inhalers or for that matter, doctors.    2) his chronic abd and chest pains date back to gb surgery and could just as easily be IBS/ adhesions > rec IBS diet/ citrucel trial then f/u gi prn  3) Each maintenance medication was reviewed in detail including most importantly the difference between maintenance and as needed and under what circumstances the prns are to be used.  Please see instructions for details which were reviewed in writing and the patient given a copy.    Pulmonary f/u is prn

## 2015-04-14 ENCOUNTER — Telehealth: Payer: Self-pay

## 2015-04-14 NOTE — Telephone Encounter (Signed)
Has been compliant with celexa despite is reservation about taking "medicines that mess with the brain." Took the ativan once on Friday and twice and Saturday when he was hanging out with his side of the family which are very high strung in nature. States the ativan did help, but does feel anything expect tired with celexa. Reminded that  somnolence as main side effect. He says that he will definitely take to at least his surgery is over 11/17, but will revisit after surgery. Did [facility] get [facility]. States that he is not ready for that at this time.  States that testing all normal at pulmonologist, but he still has CP. Feels that CP is not as bad and like it is just brewing under the surface just waiting to errupt. Advised that once we get anxiety under control I believe that will aid with CP.

## 2015-04-14 NOTE — Telephone Encounter (Signed)
Pt states Bruce Morales told him to give her a call today. Please call [phone]

## 2015-04-18 NOTE — Progress Notes (Signed)
  Medical screening examination/treatment/procedure(s) were performed by non-physician practitioner and as supervising physician I was immediately available for consultation/collaboration.     

## 2015-04-21 ENCOUNTER — Emergency Department (HOSPITAL_COMMUNITY): Payer: Worker's Compensation

## 2015-04-21 ENCOUNTER — Encounter (HOSPITAL_COMMUNITY): Payer: Self-pay

## 2015-04-21 ENCOUNTER — Emergency Department (HOSPITAL_COMMUNITY)
Admission: EM | Admit: 2015-04-21 | Discharge: 2015-04-21 | Disposition: A | Discharge status: Home / Self Care | Payer: Worker's Compensation | Attending: Emergency Medicine | Admitting: Emergency Medicine

## 2015-04-21 DIAGNOSIS — I209 Angina pectoris, unspecified: Secondary | ICD-10-CM | POA: Diagnosis not present

## 2015-04-21 DIAGNOSIS — Z87891 Personal history of nicotine dependence: Secondary | ICD-10-CM | POA: Diagnosis not present

## 2015-04-21 DIAGNOSIS — R300 Dysuria: Secondary | ICD-10-CM | POA: Insufficient documentation

## 2015-04-21 DIAGNOSIS — R740 Nonspecific elevation of levels of transaminase and lactic acid dehydrogenase [LDH]: Secondary | ICD-10-CM | POA: Diagnosis not present

## 2015-04-21 DIAGNOSIS — G8918 Other acute postprocedural pain: Secondary | ICD-10-CM | POA: Insufficient documentation

## 2015-04-21 DIAGNOSIS — R7401 Elevation of levels of liver transaminase levels: Secondary | ICD-10-CM

## 2015-04-21 DIAGNOSIS — Z791 Long term (current) use of non-steroidal anti-inflammatories (NSAID): Secondary | ICD-10-CM | POA: Insufficient documentation

## 2015-04-21 DIAGNOSIS — R079 Chest pain, unspecified: Secondary | ICD-10-CM | POA: Insufficient documentation

## 2015-04-21 DIAGNOSIS — Z9889 Other specified postprocedural states: Secondary | ICD-10-CM | POA: Insufficient documentation

## 2015-04-21 DIAGNOSIS — M1990 Unspecified osteoarthritis, unspecified site: Secondary | ICD-10-CM | POA: Diagnosis not present

## 2015-04-21 DIAGNOSIS — G8929 Other chronic pain: Secondary | ICD-10-CM | POA: Diagnosis not present

## 2015-04-21 DIAGNOSIS — R0602 Shortness of breath: Secondary | ICD-10-CM | POA: Insufficient documentation

## 2015-04-21 DIAGNOSIS — Z8719 Personal history of other diseases of the digestive system: Secondary | ICD-10-CM | POA: Diagnosis not present

## 2015-04-21 DIAGNOSIS — Z79899 Other long term (current) drug therapy: Secondary | ICD-10-CM | POA: Diagnosis not present

## 2015-04-21 DIAGNOSIS — M545 Low back pain: Secondary | ICD-10-CM | POA: Insufficient documentation

## 2015-04-21 LAB — HEPATIC FUNCTION PANEL
ALT: 480 U/L — ABNORMAL HIGH (ref 17–63)
AST: 148 U/L — ABNORMAL HIGH (ref 15–41)
Albumin: 4.5 g/dL (ref 3.5–5.0)
Alkaline Phosphatase: 75 U/L (ref 38–126)
Bilirubin, Direct: 0.1 mg/dL (ref 0.1–0.5)
Indirect Bilirubin: 0.8 mg/dL (ref 0.3–0.9)
Total Bilirubin: 0.9 mg/dL (ref 0.3–1.2)
Total Protein: 7.1 g/dL (ref 6.5–8.1)

## 2015-04-21 LAB — BASIC METABOLIC PANEL
Anion gap: 10 (ref 5–15)
BUN: 17 mg/dL (ref 6–20)
CO2: 31 mmol/L (ref 22–32)
Calcium: 9.4 mg/dL (ref 8.9–10.3)
Chloride: 100 mmol/L — ABNORMAL LOW (ref 101–111)
Creatinine, Ser: 1.03 mg/dL (ref 0.61–1.24)
GFR calc Af Amer: >60 mL/min (ref >60)
GFR calc non Af Amer: >60 mL/min (ref >60)
Glucose, Bld: 107 mg/dL — ABNORMAL HIGH (ref 65–99)
Potassium: 3.9 mmol/L (ref 3.5–5.1)
Sodium: 141 mmol/L (ref 135–145)

## 2015-04-21 LAB — CBC
HCT: 41.8 % (ref 39.0–52.0)
Hemoglobin: 14 g/dL (ref 13.0–17.0)
MCH: 29.9 pg (ref 26.0–34.0)
MCHC: 33.5 g/dL (ref 30.0–36.0)
MCV: 89.3 fL (ref 78.0–100.0)
Platelets: 142 10*3/uL — ABNORMAL LOW (ref 150–400)
RBC: 4.68 MIL/uL (ref 4.22–5.81)
RDW: 12.9 % (ref 11.5–15.5)
WBC: 6.6 10*3/uL (ref 4.0–10.5)

## 2015-04-21 LAB — I-STAT TROPONIN, ED: Troponin i, poc: 0 ng/mL (ref 0.00–0.08)

## 2015-04-21 MED ORDER — HYDROMORPHONE HCL 1 MG/ML IJ SOLN
1.0000 mg | Freq: Once | INTRAMUSCULAR | Status: AC
  Administered 2015-04-21: 1 mg via INTRAVENOUS
  Filled 2015-04-21: qty 1

## 2015-04-21 MED ORDER — HYDROMORPHONE HCL 2 MG/ML IJ SOLN
2.0000 mg | Freq: Once | INTRAMUSCULAR | Status: AC
  Administered 2015-04-21: 2 mg via INTRAVENOUS
  Filled 2015-04-21: qty 1

## 2015-04-21 MED ORDER — CYCLOBENZAPRINE HCL 10 MG PO TABS: 10.0000 mg | ORAL_TABLET | Freq: Two times a day (BID) | ORAL | Status: DC | PRN

## 2015-04-21 MED ORDER — NITROGLYCERIN 0.4 MG SL SUBL
0.4000 mg | SUBLINGUAL_TABLET | Freq: Once | SUBLINGUAL | Status: AC
  Administered 2015-04-21: 0.4 mg via SUBLINGUAL
  Filled 2015-04-21: qty 1

## 2015-04-21 NOTE — ED Notes (Signed)
Patient was alert, oriented and stable upon discharge. RN went over AVS and patient had no further questions.  

## 2015-04-21 NOTE — ED Provider Notes (Signed)
CSN: [id number]     Arrival date & time 04/21/15  1633 History   First MD Initiated Contact with Patient 04/21/15 1656     Chief Complaint  Patient presents with  . Post-op Problem  . Chest Pain  . Shortness of Breath     (Consider location/radiation/quality/duration/timing/severity/associated sxs/prior Treatment) Patient is a 39 y.o. male presenting with chest pain and shortness of breath. The history is provided by the patient.  Chest Pain Pain location:  R chest Associated symptoms: back pain and shortness of breath   Associated symptoms: no abdominal pain and no headache   Shortness of Breath Associated symptoms: chest pain   Associated symptoms: no abdominal pain and no headaches    patient developed right chest pain days ago. It is moderate severe. Worse with breathing. States he's had multiple episodes of this in the past. States they have not been able to find the cause. States he's been scoped to have a heart cath. He did have back surgery several days ago. States he also is having more pain in the back. He's been vomiting. Slight blood in the emesis. No fevers. He states he asked his neurosurgeons decrease his pain medicine. States he was on Dilaudid 4 mg and decreased down to Percocet 10 mg.  Past Medical History  Diagnosis Date  . Gall stones   . Chronic lower back pain   . Anginal pain (HCC) 04/15/2014  . Chronic bronchitis (HCC)     "probably get it q yr"  . Arthritis     "wrists, knees, lower back" (04/16/2014)  . Chronic lower back pain    Past Surgical History  Procedure Laterality Date  . Cholecystectomy  2011  . Knee arthroscopy Right 2013 X 2  . Esophagogastroduodenoscopy N/A 02/27/2013    Procedure: ESOPHAGOGASTRODUODENOSCOPY (EGD);  Surgeon: Florencia Reasonsobert V Buccini, MD;  Location: [facility];  Service: Endoscopy;  Laterality: N/A;  . Left heart catheterization with coronary angiogram N/A 04/17/2014    Procedure: LEFT HEART CATHETERIZATION WITH CORONARY  ANGIOGRAM;  Surgeon: Ricki RodriguezAjay S Kadakia, MD;  Location: [facility];  Service: Cardiovascular;  Laterality: N/A;   Family History  Problem Relation Age of Onset  . Multiple sclerosis Mother   . Heart failure Maternal Grandfather   . Diabetes Father   . Fibromyalgia Sister   . Schizophrenia Brother   . Stroke Maternal Grandmother   . Diabetes Paternal Grandmother    Social History  Substance Use Topics  . Smoking status: Former Smoker -- 0.50 packs/day for 27 years    Types: Cigarettes    Quit date: 03/11/2015  . Smokeless tobacco: Former NeurosurgeonUser  . Alcohol Use: 0.0 oz/week    0 Standard drinks or equivalent per week     Comment: 04/16/2014 "a 6 pack will last me a month"    Review of Systems  Constitutional: Negative for chills.  Respiratory: Positive for shortness of breath.   Cardiovascular: Positive for chest pain.  Gastrointestinal: Negative for abdominal pain.  Genitourinary: Positive for dysuria and difficulty urinating.  Musculoskeletal: Positive for back pain.  Neurological: Negative for headaches.      Allergies  Hydrocodone; Oxycodone; and Percocet  Home Medications   Prior to Admission medications   Medication Sig Start Date End Date Taking? Authorizing Provider  acetaminophen-codeine (TYLENOL #3) 300-30 MG tablet Take 1 tablet by mouth every 4 (four) hours as needed for moderate pain.   Yes Historical Provider, MD  albuterol (PROVENTIL HFA;VENTOLIN HFA) 108 (90 BASE) MCG/ACT inhaler Inhale  2 puffs into the lungs every 4 (four) hours as needed for wheezing or shortness of breath (cough, shortness of breath or wheezing.). 03/24/15  Yes Tishira R Brewington, PA-C  bisacodyl (DULCOLAX) 5 MG EC tablet Take 5 mg by mouth at bedtime as needed for moderate constipation.   Yes Historical Provider, MD  diazepam (VALIUM) 5 MG tablet Take 5 mg by mouth every 8 (eight) hours as needed for anxiety.   Yes Historical Provider, MD  esomeprazole (NEXIUM) 40 MG capsule Take 40 mg by  mouth 2 (two) times daily.   Yes Historical Provider, MD  gabapentin (NEURONTIN) 300 MG capsule Take 1 capsule by mouth 3 (three) times daily. 02/18/15  Yes Historical Provider, MD  HYDROmorphone (DILAUDID) 2 MG tablet Take 2-4 mg by mouth every 6 (six) hours as needed. pain 04/17/15  Yes Historical Provider, MD  meloxicam (MOBIC) 15 MG tablet Take 7.5 mg by mouth daily.    Yes Historical Provider, MD  methocarbamol (ROBAXIN) 750 MG tablet Take 750 mg by mouth 3 (three) times daily.   Yes Historical Provider, MD  oxyCODONE-acetaminophen (PERCOCET) 10-325 MG tablet Take 1 tablet by mouth every 6 (six) hours as needed for pain.   Yes Historical Provider, MD  cyclobenzaprine (FLEXERIL) 10 MG tablet Take 1 tablet (10 mg total) by mouth 2 (two) times daily as needed for muscle spasms. 04/21/15   Benjiman Core, MD   BP 128/85 mmHg  Pulse 74  Temp(Src) 98.6 F (37 C) (Oral)  Resp 15  SpO2 97% Physical Exam  Constitutional: He appears well-developed.  HENT:  Head: Atraumatic.  Neck: Neck supple.  Cardiovascular: Normal rate.   Pulmonary/Chest: Effort normal. He exhibits tenderness.  Abdominal: He exhibits no mass. There is tenderness.   tenderness to right chest and right upper abdomen.   Musculoskeletal:  Healing wound over lumbar spine with tenderness around it.  Skin: Skin is warm.    ED Course  Procedures (including critical care time) Labs Review Labs Reviewed  BASIC METABOLIC PANEL - Abnormal; Notable for the following:    Chloride 100 (*)    Glucose, Bld 107 (*)    All other components within normal limits  CBC - Abnormal; Notable for the following:    Platelets 142 (*)    All other components within normal limits  HEPATIC FUNCTION PANEL - Abnormal; Notable for the following:    AST 148 (*)    ALT 480 (*)    All other components within normal limits  I-STAT TROPOININ, ED    Imaging Review US Abdomen Complete  04/21/2015  CLINICAL DATA:  Abdominal pain for 1 day.  EXAM: ULTRASOUND ABDOMEN COMPLETE COMPARISON:  CT abdomen 04/14/2014 FINDINGS: Gallbladder: Gallbladder is surgically absent. Common bile duct: Diameter: 6 mm, normal Liver: Heterogeneous increased parenchymal echotexture most likely represent diffuse fatty infiltration. No focal lesions identified. IVC: Limited visualization.  Appears to be normal in caliber. Pancreas: Limited visualization. Visualized segments of the head and body are unremarkable. Pancreatic duct is normal caliber at 2 mm. Spleen: Size and appearance within normal limits. Right Kidney: Length: 11.5 cm. Echogenicity within normal limits. No mass or hydronephrosis visualized. Left Kidney: Length: 12.3 cm. Echogenicity within normal limits. No mass or hydronephrosis visualized. Abdominal aorta: No aneurysm visualized. Other findings: Technically limited study due to patient's body habitus and overlying bowel gas. IMPRESSION: Gallbladder is surgically absent. No bile duct dilatation. Probable fatty infiltration of the liver. Electronically Signed   By: Burman Nieves M.D.   On:  04/21/2015 20:50   Dg Chest Portable 1 View  04/21/2015  CLINICAL DATA:  Generalized chest pain, shortness of breath, emesis, lightheadedness and back pain for 1 day. Pain increases with deep breathing. Back surgery 4 days ago. EXAM: PORTABLE CHEST 1 VIEW COMPARISON:  03/24/2015. FINDINGS: Trachea is midline. Heart size normal. Lungs are clear. No pleural fluid. No pneumothorax. IMPRESSION: Negative. Electronically Signed   By: Leanna Battles M.D.   On: 04/21/2015 17:52   I have personally reviewed and evaluated these images and lab results as part of my medical decision-making.   EKG Interpretation   Date/Time:  Monday April 21 2015 16:39:01 EST Ventricular Rate:  85 PR Interval:  174 QRS Duration: 93 QT Interval:  373 QTC Calculation: 443 R Axis:   62 Text Interpretation:  Sinus rhythm Probable left atrial enlargement RSR'  in V1 or V2, right VCD or  RVH Baseline wander in lead(s) II III aVF No  significant change since last tracing Confirmed by Rubin Payor  MD, Harrold Donath  [phone]) on 04/21/2015 5:27:30 PM      MDM   Final diagnoses:  Chest pain, unspecified chest pain type  Transaminitis    Patient with chest pain. Has had previous episodes of the same. Always on the right chest. Has been seen for it in the past. Has had negative heart cath for ischemia. Did have recent back surgery. Doubt pulmonary embolism. Did recently have his pain medicine decreased. This showed mild transaminitis of unknown cause. Ultrasound reassuring. Will d/c home    Benjiman Core, MD 04/22/15 [phone]

## 2015-04-21 NOTE — ED Notes (Addendum)
Pt c/o generalized chest pain, SOB, emesis, lightheadedness, and back pain x 1 day.  Pain score 10/10.  Sts pain increases w/ deep breathing.  Pt had back surgery x 4 days ago.  Pt reports taking Tylenol 3 w/o relief.  Pt reports discectomy in L4, L5, and S1.

## 2015-04-21 NOTE — Discharge Instructions (Signed)
Take either the methocarbamol or the cyclobenzaprine for muscle spasm. Follow with your doctor for your abnormal liver enzymes.  Nonspecific Chest Pain  Chest pain can be caused by many different conditions. There is always a chance that your pain could be related to something serious, such as a heart attack or a blood clot in your lungs. Chest pain can also be caused by conditions that are not life-threatening. If you have chest pain, it is very important to follow up with your health care provider. CAUSES  Chest pain can be caused by:  Heartburn.  Pneumonia or bronchitis.  Anxiety or stress.  Inflammation around your heart (pericarditis) or lung (pleuritis or pleurisy).  A blood clot in your lung.  A collapsed lung (pneumothorax). It can develop suddenly on its own (spontaneous pneumothorax) or from trauma to the chest.  Shingles infection (varicella-zoster virus).  Heart attack.  Damage to the bones, muscles, and cartilage that make up your chest wall. This can include:  Bruised bones due to injury.  Strained muscles or cartilage due to frequent or repeated coughing or overwork.  Fracture to one or more ribs.  Sore cartilage due to inflammation (costochondritis). RISK FACTORS  Risk factors for chest pain may include:  Activities that increase your risk for trauma or injury to your chest.  Respiratory infections or conditions that cause frequent coughing.  Medical conditions or overeating that can cause heartburn.  Heart disease or family history of heart disease.  Conditions or health behaviors that increase your risk of developing a blood clot.  Having had chicken pox (varicella zoster). SIGNS AND SYMPTOMS Chest pain can feel like:  Burning or tingling on the surface of your chest or deep in your chest.  Crushing, pressure, aching, or squeezing pain.  Dull or sharp pain that is worse when you move, cough, or take a deep breath.  Pain that is also felt in  your back, neck, shoulder, or arm, or pain that spreads to any of these areas. Your chest pain may come and go, or it may stay constant. DIAGNOSIS Lab tests or other studies may be needed to find the cause of your pain. Your health care provider may have you take a test called an ambulatory ECG (electrocardiogram). An ECG records your heartbeat patterns at the time the test is performed. You may also have other tests, such as:  Transthoracic echocardiogram (TTE). During echocardiography, sound waves are used to create a picture of all of the heart structures and to look at how blood flows through your heart.  Transesophageal echocardiogram (TEE).This is a more advanced imaging test that obtains images from inside your body. It allows your health care provider to see your heart in finer detail.  Cardiac monitoring. This allows your health care provider to monitor your heart rate and rhythm in real time.  Holter monitor. This is a portable device that records your heartbeat and can help to diagnose abnormal heartbeats. It allows your health care provider to track your heart activity for several days, if needed.  Stress tests. These can be done through exercise or by taking medicine that makes your heart beat more quickly.  Blood tests.  Imaging tests. TREATMENT  Your treatment depends on what is causing your chest pain. Treatment may include:  Medicines. These may include:  Acid blockers for heartburn.  Anti-inflammatory medicine.  Pain medicine for inflammatory conditions.  Antibiotic medicine, if an infection is present.  Medicines to dissolve blood clots.  Medicines to treat coronary  artery disease.  Supportive care for conditions that do not require medicines. This may include:  Resting.  Applying heat or cold packs to injured areas.  Limiting activities until pain decreases. HOME CARE INSTRUCTIONS  If you were prescribed an antibiotic medicine, finish it all even if  you start to feel better.  Avoid any activities that bring on chest pain.  Do not use any tobacco products, including cigarettes, chewing tobacco, or electronic cigarettes. If you need help quitting, ask your health care provider.  Do not drink alcohol.  Take medicines only as directed by your health care provider.  Keep all follow-up visits as directed by your health care provider. This is important. This includes any further testing if your chest pain does not go away.  If heartburn is the cause for your chest pain, you may be told to keep your head raised (elevated) while sleeping. This reduces the chance that acid will go from your stomach into your esophagus.  Make lifestyle changes as directed by your health care provider. These may include:  Getting regular exercise. Ask your health care provider to suggest some activities that are safe for you.  Eating a heart-healthy diet. A registered dietitian can help you to learn healthy eating options.  Maintaining a healthy weight.  Managing diabetes, if necessary.  Reducing stress. SEEK MEDICAL CARE IF:  Your chest pain does not go away after treatment.  You have a rash with blisters on your chest.  You have a fever. SEEK IMMEDIATE MEDICAL CARE IF:   Your chest pain is worse.  You have an increasing cough, or you cough up blood.  You have severe abdominal pain.  You have severe weakness.  You faint.  You have chills.  You have sudden, unexplained chest discomfort.  You have sudden, unexplained discomfort in your arms, back, neck, or jaw.  You have shortness of breath at any time.  You suddenly start to sweat, or your skin gets clammy.  You feel nauseous or you vomit.  You suddenly feel light-headed or dizzy.  Your heart begins to beat quickly, or it feels like it is skipping beats. These symptoms may represent a serious problem that is an emergency. Do not wait to see if the symptoms will go away. Get medical  help right away. Call your local emergency services (911 in the [place] Do not drive yourself to the hospital.   This information is not intended to replace advice given to you by your health care provider. Make sure you discuss any questions you have with your health care provider.   Document Released: 02/24/2005 Document Revised: 06/07/2014 Document Reviewed: 12/21/2013 Elsevier Interactive Patient Education [employer].

## 2015-04-21 NOTE — ED Notes (Signed)
US at bedside

## 2015-04-21 NOTE — ED Notes (Signed)
Pt ambulatory to bathroom with significant other

## 2015-04-28 ENCOUNTER — Encounter: Payer: Self-pay | Admitting: Internal Medicine

## 2015-04-30 ENCOUNTER — Other Ambulatory Visit: Payer: Self-pay

## 2015-04-30 DIAGNOSIS — J209 Acute bronchitis, unspecified: Secondary | ICD-10-CM

## 2015-04-30 MED ORDER — ALBUTEROL SULFATE HFA 108 (90 BASE) MCG/ACT IN AERS: 2.0000 | INHALATION_SPRAY | RESPIRATORY_TRACT | Status: DC | PRN

## 2015-05-16 ENCOUNTER — Emergency Department (HOSPITAL_COMMUNITY)
Admission: EM | Admit: 2015-05-16 | Discharge: 2015-05-17 | Disposition: A | Discharge status: Home / Self Care | Payer: Worker's Compensation | Attending: Emergency Medicine | Admitting: Emergency Medicine

## 2015-05-16 ENCOUNTER — Encounter (HOSPITAL_COMMUNITY): Payer: Self-pay | Admitting: Emergency Medicine

## 2015-05-16 ENCOUNTER — Emergency Department (HOSPITAL_COMMUNITY): Payer: Worker's Compensation

## 2015-05-16 DIAGNOSIS — M545 Low back pain: Secondary | ICD-10-CM | POA: Insufficient documentation

## 2015-05-16 DIAGNOSIS — Z8709 Personal history of other diseases of the respiratory system: Secondary | ICD-10-CM | POA: Insufficient documentation

## 2015-05-16 DIAGNOSIS — I209 Angina pectoris, unspecified: Secondary | ICD-10-CM | POA: Insufficient documentation

## 2015-05-16 DIAGNOSIS — M549 Dorsalgia, unspecified: Secondary | ICD-10-CM

## 2015-05-16 DIAGNOSIS — G8929 Other chronic pain: Secondary | ICD-10-CM | POA: Diagnosis not present

## 2015-05-16 DIAGNOSIS — R200 Anesthesia of skin: Secondary | ICD-10-CM | POA: Insufficient documentation

## 2015-05-16 DIAGNOSIS — F419 Anxiety disorder, unspecified: Secondary | ICD-10-CM | POA: Insufficient documentation

## 2015-05-16 DIAGNOSIS — M19031 Primary osteoarthritis, right wrist: Secondary | ICD-10-CM | POA: Insufficient documentation

## 2015-05-16 DIAGNOSIS — Z8719 Personal history of other diseases of the digestive system: Secondary | ICD-10-CM | POA: Insufficient documentation

## 2015-05-16 DIAGNOSIS — M170 Bilateral primary osteoarthritis of knee: Secondary | ICD-10-CM | POA: Insufficient documentation

## 2015-05-16 DIAGNOSIS — Z87891 Personal history of nicotine dependence: Secondary | ICD-10-CM | POA: Diagnosis not present

## 2015-05-16 DIAGNOSIS — M4686 Other specified inflammatory spondylopathies, lumbar region: Secondary | ICD-10-CM | POA: Diagnosis not present

## 2015-05-16 DIAGNOSIS — Z79899 Other long term (current) drug therapy: Secondary | ICD-10-CM | POA: Diagnosis not present

## 2015-05-16 DIAGNOSIS — R202 Paresthesia of skin: Secondary | ICD-10-CM | POA: Insufficient documentation

## 2015-05-16 DIAGNOSIS — Z9889 Other specified postprocedural states: Secondary | ICD-10-CM | POA: Diagnosis not present

## 2015-05-16 DIAGNOSIS — M19032 Primary osteoarthritis, left wrist: Secondary | ICD-10-CM | POA: Insufficient documentation

## 2015-05-16 DIAGNOSIS — R339 Retention of urine, unspecified: Secondary | ICD-10-CM | POA: Insufficient documentation

## 2015-05-16 LAB — CBC WITH DIFFERENTIAL/PLATELET
Basophils Absolute: 0 10*3/uL (ref 0.0–0.1)
Basophils Relative: 0 %
Eosinophils Absolute: 0.1 10*3/uL (ref 0.0–0.7)
Eosinophils Relative: 2 %
HCT: 35.5 % — ABNORMAL LOW (ref 39.0–52.0)
Hemoglobin: 12.3 g/dL — ABNORMAL LOW (ref 13.0–17.0)
Lymphocytes Relative: 34 %
Lymphs Abs: 2.1 10*3/uL (ref 0.7–4.0)
MCH: 29.9 pg (ref 26.0–34.0)
MCHC: 34.6 g/dL (ref 30.0–36.0)
MCV: 86.2 fL (ref 78.0–100.0)
Monocytes Absolute: 0.5 10*3/uL (ref 0.1–1.0)
Monocytes Relative: 8 %
Neutro Abs: 3.6 10*3/uL (ref 1.7–7.7)
Neutrophils Relative %: 56 %
Platelets: 160 10*3/uL (ref 150–400)
RBC: 4.12 MIL/uL — ABNORMAL LOW (ref 4.22–5.81)
RDW: 12.1 % (ref 11.5–15.5)
WBC: 6.3 10*3/uL (ref 4.0–10.5)

## 2015-05-16 LAB — COMPREHENSIVE METABOLIC PANEL
ALT: 46 U/L (ref 17–63)
AST: 27 U/L (ref 15–41)
Albumin: 4.5 g/dL (ref 3.5–5.0)
Alkaline Phosphatase: 62 U/L (ref 38–126)
Anion gap: 11 (ref 5–15)
BUN: 16 mg/dL (ref 6–20)
CO2: 26 mmol/L (ref 22–32)
Calcium: 9.2 mg/dL (ref 8.9–10.3)
Chloride: 103 mmol/L (ref 101–111)
Creatinine, Ser: 1.02 mg/dL (ref 0.61–1.24)
GFR calc Af Amer: >60 mL/min (ref >60)
GFR calc non Af Amer: >60 mL/min (ref >60)
Glucose, Bld: 84 mg/dL (ref 65–99)
Potassium: 4.1 mmol/L (ref 3.5–5.1)
Sodium: 140 mmol/L (ref 135–145)
Total Bilirubin: 1.1 mg/dL (ref 0.3–1.2)
Total Protein: 7.1 g/dL (ref 6.5–8.1)

## 2015-05-16 LAB — C-REACTIVE PROTEIN: CRP: 5 mg/dL — ABNORMAL HIGH (ref <1.0)

## 2015-05-16 LAB — PROTIME-INR
INR: 1.06 (ref 0.00–1.49)
Prothrombin Time: 14 seconds (ref 11.6–15.2)

## 2015-05-16 LAB — SEDIMENTATION RATE: Sed Rate: 29 mm/hr — ABNORMAL HIGH (ref 0–16)

## 2015-05-16 MED ORDER — LORAZEPAM 2 MG/ML IJ SOLN: 1.0000 mg | Freq: Once | INTRAMUSCULAR | Status: DC

## 2015-05-16 MED ORDER — HYDROMORPHONE HCL 1 MG/ML IJ SOLN
1.0000 mg | Freq: Once | INTRAMUSCULAR | Status: AC
  Administered 2015-05-16: 1 mg via INTRAVENOUS
  Filled 2015-05-16: qty 1

## 2015-05-16 MED ORDER — IOHEXOL 300 MG/ML  SOLN
100.0000 mL | Freq: Once | INTRAMUSCULAR | Status: AC | PRN
  Administered 2015-05-16: 100 mL via INTRAVENOUS

## 2015-05-16 MED ORDER — LORAZEPAM 2 MG/ML IJ SOLN
1.0000 mg | Freq: Once | INTRAMUSCULAR | Status: AC
  Administered 2015-05-16: 1 mg via INTRAVENOUS
  Filled 2015-05-16: qty 1

## 2015-05-16 MED ORDER — HALOPERIDOL LACTATE 5 MG/ML IJ SOLN
5.0000 mg | Freq: Once | INTRAMUSCULAR | Status: AC
  Administered 2015-05-16: 5 mg via INTRAVENOUS
  Filled 2015-05-16: qty 1

## 2015-05-16 MED ORDER — ONDANSETRON HCL 4 MG/2ML IJ SOLN
4.0000 mg | Freq: Once | INTRAMUSCULAR | Status: AC
  Administered 2015-05-16: 4 mg via INTRAVENOUS
  Filled 2015-05-16: qty 2

## 2015-05-16 MED ORDER — DIPHENHYDRAMINE HCL 25 MG PO CAPS
25.0000 mg | ORAL_CAPSULE | Freq: Once | ORAL | Status: AC
Start: 2015-05-16 — End: 2015-05-16
  Administered 2015-05-16: 25 mg via ORAL
  Filled 2015-05-16: qty 1

## 2015-05-16 NOTE — ED Notes (Signed)
Patient transported to MRI 

## 2015-05-16 NOTE — ED Provider Notes (Addendum)
CSN: [id number]     Arrival date & time 05/16/15  1539 History   First MD Initiated Contact with Patient 05/16/15 1548     Chief Complaint  Patient presents with  . Back Pain     (Consider location/radiation/quality/duration/timing/severity/associated sxs/prior Treatment) Patient is a 39 y.o. male presenting with back pain.  Back Pain Location:  Lumbar spine Pain severity:  Severe Onset quality:  Gradual Duration:  2 days Timing:  Constant Progression:  Unchanged Chronicity:  New Context comment:  Surgery 11/17 Relieved by:  Nothing Associated symptoms: numbness and paresthesias (penile)   Associated symptoms: no abdominal pain, no bladder incontinence, no bowel incontinence, no chest pain, no dysuria, no fever, no headaches, no tingling, no weakness and no weight loss     Past Medical History  Diagnosis Date  . Gall stones   . Chronic lower back pain   . Anginal pain (HCC) 04/15/2014  . Chronic bronchitis (HCC)     "probably get it q yr"  . Arthritis     "wrists, knees, lower back" (04/16/2014)  . Chronic lower back pain    Past Surgical History  Procedure Laterality Date  . Cholecystectomy  2011  . Knee arthroscopy Right 2013 X 2  . Esophagogastroduodenoscopy N/A 02/27/2013    Procedure: ESOPHAGOGASTRODUODENOSCOPY (EGD);  Surgeon: Florencia Reasons, MD;  Location: [facility];  Service: Endoscopy;  Laterality: N/A;  . Left heart catheterization with coronary angiogram N/A 04/17/2014    Procedure: LEFT HEART CATHETERIZATION WITH CORONARY ANGIOGRAM;  Surgeon: Ricki Rodriguez, MD;  Location: [facility];  Service: Cardiovascular;  Laterality: N/A;   Family History  Problem Relation Age of Onset  . Multiple sclerosis Mother   . Heart failure Maternal Grandfather   . Diabetes Father   . Fibromyalgia Sister   . Schizophrenia Brother   . Stroke Maternal Grandmother   . Diabetes Paternal Grandmother    Social History  Substance Use Topics  . Smoking status: Former  Smoker -- 0.50 packs/day for 27 years    Types: Cigarettes    Quit date: 03/11/2015  . Smokeless tobacco: Former Neurosurgeon  . Alcohol Use: 0.0 oz/week    0 Standard drinks or equivalent per week     Comment: 04/16/2014 "a 6 pack will last me a month"    Review of Systems  Constitutional: Negative for fever and weight loss.  HENT: Negative for sore throat.   Eyes: Negative for visual disturbance.  Respiratory: Negative for shortness of breath.   Cardiovascular: Negative for chest pain.  Gastrointestinal: Negative for nausea, vomiting, abdominal pain and bowel incontinence.  Genitourinary: Negative for bladder incontinence, dysuria and difficulty urinating.  Musculoskeletal: Positive for back pain. Negative for neck stiffness.  Skin: Negative for rash.  Neurological: Positive for numbness and paresthesias (penile). Negative for tingling, syncope, weakness and headaches.      Allergies  Hydrocodone; Oxycodone; and Percocet  Home Medications   Prior to Admission medications   Medication Sig Start Date End Date Taking? Authorizing Provider  albuterol (PROVENTIL HFA;VENTOLIN HFA) 108 (90 BASE) MCG/ACT inhaler Inhale 2 puffs into the lungs every 4 (four) hours as needed (cough, shortness of breath or wheezing.). 04/30/15  Yes Collie Siad English, PA  cyclobenzaprine (FLEXERIL) 10 MG tablet Take 1 tablet (10 mg total) by mouth 2 (two) times daily as needed for muscle spasms. 04/21/15  Yes Benjiman Core, MD  gabapentin (NEURONTIN) 300 MG capsule Take 1 capsule by mouth 3 (three) times daily. 02/18/15  Yes Historical  Provider, MD  HYDROmorphone (DILAUDID) 4 MG tablet TAKE 1 TABLET BY MOUTH EVERY 4 TO 6 HOURS AS  FOR PAIN 05/08/15  Yes Historical Provider, MD  meloxicam (MOBIC) 15 MG tablet Take 7.5 mg by mouth daily as needed for pain.    Yes Historical Provider, MD  MOVANTIK 25 MG TABS tablet TAKE 1 TABLET BY MOUTH ONCE EVERY MORNING 05/07/15  Yes Historical Provider, MD  acetaminophen-codeine  (TYLENOL #3) 300-30 MG tablet Take 1 tablet by mouth every 4 (four) hours as needed for moderate pain.    Historical Provider, MD  methocarbamol (ROBAXIN) 750 MG tablet Take 750 mg by mouth every 8 (eight) hours as needed. 03/17/15   Historical Provider, MD  oxyCODONE-acetaminophen (PERCOCET) 10-325 MG tablet Take 1 tablet by mouth every 6 (six) hours as needed for pain.    Historical Provider, MD   BP 131/68 mmHg  Pulse 86  Temp(Src) 98.2 F (36.8 C) (Oral)  Resp 20  SpO2 95% Physical Exam  Constitutional: He is oriented to person, place, and time. He appears well-developed and well-nourished. No distress.  HENT:  Head: Normocephalic and atraumatic.  Eyes: Conjunctivae and EOM are normal.  Neck: Normal range of motion.  Cardiovascular: Normal rate, regular rhythm, normal heart sounds and intact distal pulses.  Exam reveals no gallop and no friction rub.   No murmur heard. Pulmonary/Chest: Effort normal and breath sounds normal. No respiratory distress. He has no wheezes. He has no rales.  Abdominal: Soft. He exhibits no distension. There is no tenderness. There is no guarding.  Musculoskeletal: He exhibits no edema.  Neurological: He is alert and oriented to person, place, and time. He has normal strength. No cranial nerve deficit or sensory deficit. Gait normal. GCS eye subscore is 4. GCS verbal subscore is 5. GCS motor subscore is 6.  Skin: Skin is warm and dry. He is not diaphoretic.  Nursing note and vitals reviewed.   ED Course  Procedures (including critical care time) Labs Review Labs Reviewed  CBC WITH DIFFERENTIAL/PLATELET - Abnormal; Notable for the following:    RBC 4.12 (*)    Hemoglobin 12.3 (*)    HCT 35.5 (*)    All other components within normal limits  SEDIMENTATION RATE - Abnormal; Notable for the following:    Sed Rate 29 (*)    All other components within normal limits  C-REACTIVE PROTEIN - Abnormal; Notable for the following:    CRP 5.0 (*)    All other  components within normal limits  COMPREHENSIVE METABOLIC PANEL  PROTIME-INR    Imaging Review Ct Lumbar Spine W Contrast  05/16/2015  CLINICAL DATA:  Initial evaluation for chronic low back pain, worsened for past 3 days. History of prior surgery. EXAM: CT LUMBAR SPINE WITH CONTRAST TECHNIQUE: Multidetector CT imaging of the lumbar spine was performed with intravenous contrast administration. Multiplanar CT image reconstructions were also generated. CONTRAST:  100mL OMNIPAQUE IOHEXOL 300 MG/ML  SOLN COMPARISON:  Prior MRI from 10/17/2014. FINDINGS: Alignment is stable with preservation of the normal lumbar lordosis. Vertebral body heights maintained. No fracture or listhesis. Conus medullaris grossly normal, terminating at the L1 level. Postoperative changes from decompressive right hemi laminectomy present at L5. Postsurgical changes present within the soft tissues of the lower back. No focal fluid collections or overt evidence for infection. Visualized paraspinous soft tissues are otherwise unremarkable. L1-2:  Negative. L2-3:  Negative. L3-4:  Negative. L4-5: Mild degenerative annular disc bulge. No definite new focal disc herniation. No significant stenosis.  L5-S1: Postoperative changes from interval decompressive right hemi laminectomy. Soft tissue density within the right lateral epidural space, which may reflect postoperative changes/granulation tissue. There is persistent degenerative disc bulge at this level. On sagittal and axial sequence, there is mildly hyperdense soft tissue density within the ventral epidural space posterior to the L5 vertebral body (series 605, image 30). Unclear whether this reflects new disc herniation/extrusion. While this does not have the appearance of frank abscess, possible infection is not entirely excluded. The central canal remains grossly patent without significant stenosis. Additionally, there is soft tissue density within the right L5-S1 neural foramen, favored to  reflect postoperative changes, although possible disc material or infection could be considered. IMPRESSION: 1. Sequela of interval decompressive right hemi laminectomy at L5-S1. There is mildly hyperdense soft tissue density within the ventral epidural space posterior to the L5 vertebral body, which may reflect new disc herniation/extrusion. While no frank epidural abscess is identified, possible infection could be considered in the correct clinical setting. No evidence for osteomyelitis discitis within the adjacent L5-S1 disc space. No other evidence for infection about the operative site. No severe canal stenosis appreciated on this examination. Further evaluation with MRI, with and without contrast, would likely be helpful for further evaluation. 2. Mild degenerative disc bulge at L4-5 without stenosis. 3. Otherwise negative CT of the lumbar spine. Electronically Signed   By: Rise Mu M.D.   On: 05/16/2015 23:51   I have personally reviewed and evaluated these images and lab results as part of my medical decision-making.   EKG Interpretation None      MDM   Final diagnoses:  Back pain   39 year old male with a history of back pain status post recent decompressive right hemilaminectomy in November presents with concern for increased back pain for 2 days. Patient initially reported penile numbness and urinary retention, and MRI with and without contrast is ordered to evaluate for signs of infection or other.  Attempted MRI twice, however unable to complete secondary to patient's claustrophobia. He had been given Dilaudid, Haldol and Ativan, however became extremely anxious in the MRI and was unable to complete the scans. Discussed options with patient and his father including possible transfer to [facility], however at this time patient reports he does not have any difficulty urinating, and prior groin paresthesias have resolved. Given patient has otherwise normal strength, normal  sensation have low suspicion for cauda equina or other acute spinal pathology, however given increased pain with recent surgery, and question of Dr. Barry Dienes on exam obtained CT lumbar spine with contrast which patient was able to complete. CT showed expected postsurgical changes. There was an area that may reflect new disc herniation, with no frank epidural abscess, and given patient without fever, no leukocytosis, or other signs of infectious change, low suspicion that this represents infection.  Patient was discharged to follow-up with neurosurgery and Monday. Patient discharged in stable condition with understanding of reasons to return.    Alvira Monday, MD 05/17/15 [phone]

## 2015-05-16 NOTE — ED Notes (Signed)
Pt states he has lower back pain x 3 days. States pain is in his R side of his lower back and R buttock. Pain worsens with mvmt. Pt has hx of chronic LBP. States he had a procedure done on 11/17. States he takes Dilaudid 4mg  PO every 4 hours and now it is not helping his pain much. Pt also states that he feels he is having incomplete emptying of his bladder. Pt ambulatory to exam room in fast track. Rates pain 10/10.

## 2015-05-16 NOTE — ED Notes (Signed)
Patient requesting pain medication. Explained to patient I will follow up with PA-C that was seeing him in fast track to see if she will continue to be his provider and the provider will have to order pain medication after evaluated him. Patient verbalizes understanding of same.

## 2015-05-16 NOTE — Progress Notes (Signed)
Male visitor came to CM to inquire about pain medicine for pt and issues with call bell Cm assisted updating RN and checking pt call bell which appeared to have been pulled out of the socket Cm re inserted it and call bell alarmed  ED RN spoke with pt and male visitor but had seen pt prior to visitor coming to see pt

## 2015-05-16 NOTE — ED Notes (Signed)
Called MRI. Approximately 35-40 minutes.

## 2015-05-16 NOTE — ED Provider Notes (Signed)
MSE was initiated and I personally evaluated the patient and placed orders (if any) at  4:18 PM on May 16, 2015. Worsening back pain despite 4mg  PO dilaudid q4h at home.  Patient states increased effort with urination and decreased sensation of penis. Patient of Dr. Shon BatonBrooks with recent back surgery on 04/17/2015.   The patient appears stable so that the remainder of the MSE may be completed by another provider. Discussed case with Dr. Dalene SeltzerSchlossman who will assume care.   [facility] LLCJaime Pilcher Phares Zaccone, PA-C 05/16/15 1730  Alvira MondayErin Schlossman, MD 05/18/15 [phone]

## 2015-05-17 NOTE — Discharge Instructions (Signed)

## 2015-05-17 NOTE — ED Notes (Signed)
Patient up and ambulating without assistance, steady gait.

## 2015-05-23 DIAGNOSIS — Z0271 Encounter for disability determination: Secondary | ICD-10-CM

## 2015-06-04 ENCOUNTER — Encounter (HOSPITAL_COMMUNITY): Payer: Self-pay | Admitting: *Deleted

## 2015-06-04 ENCOUNTER — Telehealth: Payer: Self-pay

## 2015-06-04 ENCOUNTER — Ambulatory Visit (INDEPENDENT_AMBULATORY_CARE_PROVIDER_SITE_OTHER): Payer: 59 | Admitting: Family Medicine

## 2015-06-04 ENCOUNTER — Emergency Department (HOSPITAL_COMMUNITY)
Admission: EM | Admit: 2015-06-04 | Discharge: 2015-06-04 | Disposition: A | Discharge status: Home / Self Care | Payer: Worker's Compensation | Source: Home / Self Care

## 2015-06-04 VITALS — BP 148/90 | HR 95 | Temp 101.1°F | Resp 20 | Ht 67.0 in | Wt 219.0 lb

## 2015-06-04 DIAGNOSIS — M545 Low back pain: Secondary | ICD-10-CM

## 2015-06-04 DIAGNOSIS — R42 Dizziness and giddiness: Secondary | ICD-10-CM

## 2015-06-04 DIAGNOSIS — M549 Dorsalgia, unspecified: Secondary | ICD-10-CM | POA: Insufficient documentation

## 2015-06-04 DIAGNOSIS — G4489 Other headache syndrome: Secondary | ICD-10-CM | POA: Diagnosis not present

## 2015-06-04 DIAGNOSIS — R1114 Bilious vomiting: Secondary | ICD-10-CM

## 2015-06-04 DIAGNOSIS — R509 Fever, unspecified: Secondary | ICD-10-CM

## 2015-06-04 DIAGNOSIS — E860 Dehydration: Secondary | ICD-10-CM

## 2015-06-04 DIAGNOSIS — R1110 Vomiting, unspecified: Secondary | ICD-10-CM | POA: Insufficient documentation

## 2015-06-04 DIAGNOSIS — M5441 Lumbago with sciatica, right side: Secondary | ICD-10-CM

## 2015-06-04 DIAGNOSIS — G8929 Other chronic pain: Secondary | ICD-10-CM | POA: Insufficient documentation

## 2015-06-04 DIAGNOSIS — R51 Headache: Secondary | ICD-10-CM | POA: Insufficient documentation

## 2015-06-04 LAB — COMPLETE METABOLIC PANEL WITH GFR
ALT: 27 U/L (ref 9–46)
AST: 17 U/L (ref 10–40)
Albumin: 4.4 g/dL (ref 3.6–5.1)
Alkaline Phosphatase: 69 U/L (ref 40–115)
BUN: 18 mg/dL (ref 7–25)
CO2: 22 mmol/L (ref 20–31)
Calcium: 9.7 mg/dL (ref 8.6–10.3)
Chloride: 98 mmol/L (ref 98–110)
Creat: 1.11 mg/dL (ref 0.60–1.35)
GFR, Est African American: >89 mL/min (ref >=60)
GFR, Est Non African American: 84 mL/min (ref >=60)
Glucose, Bld: 95 mg/dL (ref 65–99)
Potassium: 4.4 mmol/L (ref 3.5–5.3)
Sodium: 136 mmol/L (ref 135–146)
Total Bilirubin: 1.7 mg/dL — ABNORMAL HIGH (ref 0.2–1.2)
Total Protein: 7.3 g/dL (ref 6.1–8.1)

## 2015-06-04 LAB — POCT CBC
Granulocyte percent: 72 %G (ref 37–80)
HCT, POC: 38.3 % — AB (ref 43.5–53.7)
Hemoglobin: 13.1 g/dL — AB (ref 14.1–18.1)
Lymph, poc: 2.2 (ref 0.6–3.4)
MCH, POC: 29.3 pg (ref 27–31.2)
MCHC: 34.2 g/dL (ref 31.8–35.4)
MCV: 85.8 fL (ref 80–97)
MID (cbc): 1.4 — AB (ref 0–0.9)
MPV: 8.2 fL (ref 0–99.8)
POC Granulocyte: 9.3 — AB (ref 2–6.9)
POC LYMPH PERCENT: 16.9 %L (ref 10–50)
POC MID %: 11.1 %M (ref 0–12)
Platelet Count, POC: 164 10*3/uL (ref 142–424)
RBC: 4.47 M/uL — AB (ref 4.69–6.13)
RDW, POC: 11.8 %
WBC: 12.9 10*3/uL — AB (ref 4.6–10.2)

## 2015-06-04 LAB — CBC
HCT: 35.2 % — ABNORMAL LOW (ref 39.0–52.0)
Hemoglobin: 12.1 g/dL — ABNORMAL LOW (ref 13.0–17.0)
MCH: 29.5 pg (ref 26.0–34.0)
MCHC: 34.4 g/dL (ref 30.0–36.0)
MCV: 85.9 fL (ref 78.0–100.0)
Platelets: 173 10*3/uL (ref 150–400)
RBC: 4.1 MIL/uL — ABNORMAL LOW (ref 4.22–5.81)
RDW: 11.7 % (ref 11.5–15.5)
WBC: 12.8 10*3/uL — ABNORMAL HIGH (ref 4.0–10.5)

## 2015-06-04 LAB — URINALYSIS, ROUTINE W REFLEX MICROSCOPIC
Bilirubin Urine: NEGATIVE
Glucose, UA: NEGATIVE mg/dL
Hgb urine dipstick: NEGATIVE
Ketones, ur: 40 mg/dL — AB
Leukocytes, UA: NEGATIVE
Nitrite: NEGATIVE
Protein, ur: 30 mg/dL — AB
Specific Gravity, Urine: 1.024 (ref 1.005–1.030)
pH: 8.5 — ABNORMAL HIGH (ref 5.0–8.0)

## 2015-06-04 LAB — URINE MICROSCOPIC-ADD ON

## 2015-06-04 LAB — COMPREHENSIVE METABOLIC PANEL
ALT: 28 U/L (ref 17–63)
AST: 20 U/L (ref 15–41)
Albumin: 3.5 g/dL (ref 3.5–5.0)
Alkaline Phosphatase: 60 U/L (ref 38–126)
Anion gap: 12 (ref 5–15)
BUN: 17 mg/dL (ref 6–20)
CO2: 22 mmol/L (ref 22–32)
Calcium: 9.1 mg/dL (ref 8.9–10.3)
Chloride: 103 mmol/L (ref 101–111)
Creatinine, Ser: 1.11 mg/dL (ref 0.61–1.24)
GFR calc Af Amer: >60 mL/min (ref >60)
GFR calc non Af Amer: >60 mL/min (ref >60)
Glucose, Bld: 109 mg/dL — ABNORMAL HIGH (ref 65–99)
Potassium: 3.9 mmol/L (ref 3.5–5.1)
Sodium: 137 mmol/L (ref 135–145)
Total Bilirubin: 1.8 mg/dL — ABNORMAL HIGH (ref 0.3–1.2)
Total Protein: 6.7 g/dL (ref 6.5–8.1)

## 2015-06-04 LAB — GLUCOSE, POCT (MANUAL RESULT ENTRY): POC Glucose: 97 mg/dl (ref 70–99)

## 2015-06-04 LAB — LIPASE, BLOOD: Lipase: 26 U/L (ref 11–51)

## 2015-06-04 LAB — POCT INFLUENZA A/B
Influenza A, POC: NEGATIVE
Influenza B, POC: NEGATIVE

## 2015-06-04 LAB — LIPASE: Lipase: 14 U/L (ref 7–60)

## 2015-06-04 MED ORDER — PROMETHAZINE HCL 25 MG PO TABS: 25.0000 mg | ORAL_TABLET | Freq: Three times a day (TID) | ORAL | Status: DC | PRN

## 2015-06-04 MED ORDER — KETOROLAC TROMETHAMINE 60 MG/2ML IM SOLN
60.0000 mg | Freq: Once | INTRAMUSCULAR | Status: AC
  Administered 2015-06-04: 60 mg via INTRAMUSCULAR

## 2015-06-04 MED ORDER — PROMETHAZINE HCL 25 MG/ML IJ SOLN
25.0000 mg | Freq: Once | INTRAMUSCULAR | Status: AC
  Administered 2015-06-04: 25 mg via INTRAMUSCULAR

## 2015-06-04 NOTE — ED Notes (Signed)
Dr Nedra HaiLee at Urgent Care gave pt a phernergan shot earlier today. States the shot is wearing off.

## 2015-06-04 NOTE — Telephone Encounter (Signed)
Patient is calling because the he was just seen and the ER has a 5 hour wait. Patient wants to know if Dr. Conley RollsLe can prescribe finnegan for his nausea.

## 2015-06-04 NOTE — ED Notes (Signed)
States zofran does not work.

## 2015-06-04 NOTE — ED Notes (Signed)
Pt states he does not wish to wait to see a Dr. Andrey CotaStates he is going to try to call Dr Nedra HaiLee and ask for a prescription of phenergan. States he will wait in the waiting room for 30 minutes.

## 2015-06-04 NOTE — ED Notes (Signed)
Pt told registration that he had to get home to his kids and was unable to wait to be seen.

## 2015-06-04 NOTE — ED Notes (Signed)
Pt states he had back surgery in November. C/o headache and vomiting starting today making he back pain worse.

## 2015-06-04 NOTE — Progress Notes (Signed)
Chief Complaint:  Chief Complaint  Patient presents with  . Emesis  . Nausea    HPI: Bruce Morales is a 40 y.o. [facility] complaining of 2 day history of diffuse HA, he woke up with HA, all over his head, he has not been confused. Has had some photophobia and also noise sensitivties. This has not been associated with any CVA like sxs in his face or upper extremities.He  Has not slept in 2 days due to back problems. He is in so much pain, when he throws up it makes his back gyrates and jolts. He is throwing up bile. He has not eaten anything all day. He ate adn threw up last night. HE ate quinoa made with some seafood dip.  HE has had BM,, last BM was diarrhea , nonbloody, non today. He has no URI sxs.   HE has had microdiscemotmy, evertytime he coughs, vomits he has pain.    IMPRESSION: 1. Sequela of interval decompressive right hemi laminectomy at L5-S1. There is mildly hyperdense soft tissue density within the ventral epidural space posterior to the L5 vertebral body, which may reflect new disc herniation/extrusion. While no frank epidural abscess is identified, possible infection could be considered in the correct clinical setting. No evidence for osteomyelitis discitis within the adjacent L5-S1 disc space. No other evidence for infection about the operative site. No severe canal stenosis appreciated on this examination. Further evaluation with MRI, with and without contrast, would likely be helpful for further evaluation. 2. Mild degenerative disc bulge at L4-5 without stenosis. 3. Otherwise negative CT of the lumbar spine.   Electronically Signed  By: Rise MuBenjamin McClintock M.D.  On: 05/16/2015 23:51  Past Medical History  Diagnosis Date  . Gall stones   . Chronic lower back pain   . Anginal pain (HCC) 04/15/2014  . Chronic bronchitis (HCC)     "probably get it q yr"  . Arthritis     "wrists, knees, lower back" (04/16/2014)  .  Chronic lower back pain    Past Surgical History  Procedure Laterality Date  . Cholecystectomy  2011  . Knee arthroscopy Right 2013 X 2  . Esophagogastroduodenoscopy N/A 02/27/2013    Procedure: ESOPHAGOGASTRODUODENOSCOPY (EGD);  Surgeon: Florencia Reasonsobert V Buccini, MD;  [facility];  Service: Endoscopy;  Laterality: N/A;  . Left heart catheterization with coronary angiogram N/A 04/17/2014    Procedure: LEFT HEART CATHETERIZATION WITH CORONARY ANGIOGRAM;  Surgeon: Ricki RodriguezAjay S Kadakia, MD;  Location: [facility];  Service: Cardiovascular;  Laterality: N/A;   Social History   Social History  . Marital Status: Married    Spouse Name: N/A  . Number of Children: N/A  . Years of Education: N/A   Social History Main Topics  . Smoking status: Former Smoker -- 0.50 packs/day for 27 years    Types: Cigarettes    Quit date: 03/11/2015  . Smokeless tobacco: Former NeurosurgeonUser  . Alcohol Use: 0.0 oz/week    0 Standard drinks or equivalent per week     Comment: 04/16/2014 "a 6 pack will last me a month"  . Drug Use: Yes     Comment: 04/16/2014 "in my teens"  . Sexual Activity: Yes   Other Topics Concern  . None   Social History Narrative   Drinks 2 cups of coffee in the morning.   Family History  Problem Relation Age of Onset  . Multiple sclerosis Mother   . Heart failure Maternal  Grandfather   . Diabetes Father   . Fibromyalgia Sister   . Schizophrenia Brother   . Stroke Maternal Grandmother   . Diabetes Paternal Grandmother    Allergies  Allergen Reactions  . Hydrocodone Hives and Itching  . Oxycodone     Itching   . Percocet [Oxycodone-Acetaminophen]     Itching    Prior to Admission medications   Medication Sig Start Date End Date Taking? Authorizing Provider  acetaminophen-codeine (TYLENOL #3) 300-30 MG tablet Take 1 tablet by mouth every 4 (four) hours as needed for moderate pain.   Yes Historical Provider, MD  gabapentin (NEURONTIN) 300 MG capsule Take 1 capsule by mouth 3  (three) times daily. 02/18/15  Yes Historical Provider, MD  HYDROmorphone (DILAUDID) 4 MG tablet TAKE 1 TABLET BY MOUTH EVERY 4 TO 6 HOURS AS  FOR PAIN 05/08/15  Yes Historical Provider, MD  meloxicam (MOBIC) 15 MG tablet Take 7.5 mg by mouth daily as needed for pain.    Yes Historical Provider, MD  methocarbamol (ROBAXIN) 750 MG tablet Take 750 mg by mouth every 8 (eight) hours as needed. 03/17/15  Yes Historical Provider, MD  albuterol (PROVENTIL HFA;VENTOLIN HFA) 108 (90 BASE) MCG/ACT inhaler Inhale 2 puffs into the lungs every 4 (four) hours as needed (cough, shortness of breath or wheezing.). Patient not taking: Reported on 06/04/2015 04/30/15   Collie Siad English, PA  cyclobenzaprine (FLEXERIL) 10 MG tablet Take 1 tablet (10 mg total) by mouth 2 (two) times daily as needed for muscle spasms. Patient not taking: Reported on 06/04/2015 04/21/15   Benjiman Core, MD  MOVANTIK 25 MG TABS tablet Reported on 06/04/2015 05/07/15   Historical Provider, MD  oxyCODONE-acetaminophen (PERCOCET) 10-325 MG tablet Take 1 tablet by mouth every 6 (six) hours as needed for pain. Reported on 06/04/2015    Historical Provider, MD     ROS: The patient denies night sweats, unintentional weight loss, chest pain, palpitations, wheezing, dyspnea on exertion  dysuria, hematuria, melena,  Acute numbness, weakness, or tingling.  On,  All other systems have been reviewed and were otherwise negative with the exception of those mentioned in the HPI and as above.    PHYSICAL EXAM: Filed Vitals:   06/04/15 1556 06/04/15 1821  BP: 148/90   Pulse: 95   Temp: 99.3 F (37.4 C) 101.1 F (38.4 C)  Resp: 20   .last Body mass index is 34.29 kg/(m^2).   General: Alert, moderate  acute distress HEENT:  Normocephalic, atraumatic, oropharynx patent. EOMI, PERRLA Fundoscopic exam normal, dry oral mucosa, tm normal, no boggy nares Cardiovascular:  Regular rate and rhythm, no rubs murmurs or gallops.  No Carotid bruits, radial pulse  intact. No pedal edema.  Respiratory: Clear to auscultation bilaterally.  No wheezes, rales, or rhonchi.  No cyanosis, no use of accessory musculature Abdominal: No organomegaly, abdomen is soft and non-tender, positive bowel sounds. No masses. Skin: No rashes. Neurologic: Facial musculature symmetric. Psychiatric: Patient acts appropriately throughout our interaction. Lymphatic: No cervical or submandibular lymphadenopathy Musculoskeletal: Gait intact. No edema, tenderness Negative meningeal signs, full ROM, no nuchal rigidity Neg saddle anesthesia   LABS: Results for orders placed or performed in visit on 06/04/15  POCT CBC  Result Value Ref Range   WBC 12.9 (A) 4.6 - 10.2 K/uL   Lymph, poc 2.2 0.6 - 3.4   POC LYMPH PERCENT 16.9 10 - 50 %L   MID (cbc) 1.4 (A) 0 - 0.9   POC MID % 11.1 0 - 12 %  M   POC Granulocyte 9.3 (A) 2 - 6.9   Granulocyte percent 72.0 37 - 80 %G   RBC 4.47 (A) 4.69 - 6.13 M/uL   Hemoglobin 13.1 (A) 14.1 - 18.1 g/dL   HCT, POC 69.6 (A) 29.5 - 53.7 %   MCV 85.8 80 - 97 fL   MCH, POC 29.3 27 - 31.2 pg   MCHC 34.2 31.8 - 35.4 g/dL   RDW, POC 28.4 %   Platelet Count, POC 164 142 - 424 K/uL   MPV 8.2 0 - 99.8 fL  POCT glucose (manual entry)  Result Value Ref Range   POC Glucose 97 70 - 99 mg/dl  POCT Influenza A/B  Result Value Ref Range   Influenza A, POC Negative Negative   Influenza B, POC Negative Negative     EKG/XRAY:   Primary read interpreted by Dr. Conley Rolls at [facility].   ASSESSMENT/PLAN: Encounter Diagnoses  Name Primary?  . Other headache syndrome Yes  . Bilious vomiting with nausea   . Dizziness and giddiness   . Midline low back pain, with sciatica presence unspecified   . Dehydration   . Fever, unspecified   . Right-sided low back pain with right-sided sciatica    Likely viral illness but need to rule out neuro etiology , since worse HA of life,  Also Family hx of father wi th aneurysm at age  60; he has neg meningeal signs on neuro exam    Intractactable n/v, diffuse Headaches, worse headache of his life. 11/10. No improvement with phenergan 25 mg IM , toradaol injeection 60 mg IM x 1 Dad had aneurysm at age 39, patient is a former smoker.  Has back pain as well with hx of discetomy s/p recent normal MRI recently Dr Marikay Alar is neurosurgeon HE has had nonbloody diarrhea x 1 Fu in ER, [facility] notified. Patient decliend to go by ambulance, will go by private vehicle.  Labs pending   Gross sideeffects, risk and benefits, and alternatives of medications d/w patient. Patient is aware that all medications have potential sideeffects and we are unable to predict every sideeffect or drug-drug interaction that may occur.  Luke Rigsbee DO  06/04/2015 6:36 PM

## 2015-06-05 ENCOUNTER — Other Ambulatory Visit: Payer: 59 | Admitting: Diagnostic Radiology

## 2015-06-05 ENCOUNTER — Encounter (HOSPITAL_COMMUNITY): Payer: Self-pay | Admitting: Emergency Medicine

## 2015-06-05 ENCOUNTER — Inpatient Hospital Stay (HOSPITAL_COMMUNITY): Payer: Worker's Compensation

## 2015-06-05 ENCOUNTER — Emergency Department (HOSPITAL_COMMUNITY): Payer: Worker's Compensation

## 2015-06-05 ENCOUNTER — Inpatient Hospital Stay (HOSPITAL_COMMUNITY)
Admission: EM | Admit: 2015-06-05 | Discharge: 2015-06-15 | DRG: 871 | Disposition: A | Discharge status: Home Health | Payer: Worker's Compensation | Attending: Internal Medicine | Admitting: Internal Medicine

## 2015-06-05 DIAGNOSIS — A419 Sepsis, unspecified organism: Principal | ICD-10-CM | POA: Diagnosis present

## 2015-06-05 DIAGNOSIS — T814XXD Infection following a procedure, subsequent encounter: Secondary | ICD-10-CM | POA: Diagnosis not present

## 2015-06-05 DIAGNOSIS — G009 Bacterial meningitis, unspecified: Secondary | ICD-10-CM

## 2015-06-05 DIAGNOSIS — R51 Headache: Secondary | ICD-10-CM

## 2015-06-05 DIAGNOSIS — S31000D Unspecified open wound of lower back and pelvis without penetration into retroperitoneum, subsequent encounter: Secondary | ICD-10-CM | POA: Diagnosis not present

## 2015-06-05 DIAGNOSIS — Z9889 Other specified postprocedural states: Secondary | ICD-10-CM

## 2015-06-05 DIAGNOSIS — Z79899 Other long term (current) drug therapy: Secondary | ICD-10-CM

## 2015-06-05 DIAGNOSIS — Z6838 Body mass index (BMI) 38.0-38.9, adult: Secondary | ICD-10-CM

## 2015-06-05 DIAGNOSIS — B957 Other staphylococcus as the cause of diseases classified elsewhere: Secondary | ICD-10-CM | POA: Diagnosis not present

## 2015-06-05 DIAGNOSIS — R519 Headache, unspecified: Secondary | ICD-10-CM | POA: Diagnosis present

## 2015-06-05 DIAGNOSIS — J209 Acute bronchitis, unspecified: Secondary | ICD-10-CM | POA: Diagnosis present

## 2015-06-05 DIAGNOSIS — M549 Dorsalgia, unspecified: Secondary | ICD-10-CM

## 2015-06-05 DIAGNOSIS — Z6836 Body mass index (BMI) 36.0-36.9, adult: Secondary | ICD-10-CM

## 2015-06-05 DIAGNOSIS — K5903 Drug induced constipation: Secondary | ICD-10-CM | POA: Diagnosis not present

## 2015-06-05 DIAGNOSIS — E66812 Obesity, class 2: Secondary | ICD-10-CM | POA: Diagnosis present

## 2015-06-05 DIAGNOSIS — G003 Staphylococcal meningitis: Secondary | ICD-10-CM | POA: Diagnosis present

## 2015-06-05 DIAGNOSIS — T40605A Adverse effect of unspecified narcotics, initial encounter: Secondary | ICD-10-CM | POA: Diagnosis not present

## 2015-06-05 DIAGNOSIS — G44001 Cluster headache syndrome, unspecified, intractable: Secondary | ICD-10-CM

## 2015-06-05 DIAGNOSIS — B9562 Methicillin resistant Staphylococcus aureus infection as the cause of diseases classified elsewhere: Secondary | ICD-10-CM | POA: Diagnosis not present

## 2015-06-05 DIAGNOSIS — Z87891 Personal history of nicotine dependence: Secondary | ICD-10-CM | POA: Diagnosis not present

## 2015-06-05 DIAGNOSIS — G44011 Episodic cluster headache, intractable: Secondary | ICD-10-CM | POA: Diagnosis not present

## 2015-06-05 DIAGNOSIS — D696 Thrombocytopenia, unspecified: Secondary | ICD-10-CM | POA: Diagnosis not present

## 2015-06-05 DIAGNOSIS — G039 Meningitis, unspecified: Secondary | ICD-10-CM

## 2015-06-05 DIAGNOSIS — G44021 Chronic cluster headache, intractable: Secondary | ICD-10-CM | POA: Diagnosis not present

## 2015-06-05 DIAGNOSIS — R509 Fever, unspecified: Secondary | ICD-10-CM

## 2015-06-05 DIAGNOSIS — T814XXA Infection following a procedure, initial encounter: Secondary | ICD-10-CM | POA: Diagnosis not present

## 2015-06-05 DIAGNOSIS — T8189XD Other complications of procedures, not elsewhere classified, subsequent encounter: Secondary | ICD-10-CM | POA: Diagnosis not present

## 2015-06-05 DIAGNOSIS — S31000A Unspecified open wound of lower back and pelvis without penetration into retroperitoneum, initial encounter: Secondary | ICD-10-CM | POA: Diagnosis not present

## 2015-06-05 LAB — CBC
HCT: 36.2 % — ABNORMAL LOW (ref 39.0–52.0)
Hemoglobin: 12.2 g/dL — ABNORMAL LOW (ref 13.0–17.0)
MCH: 29.8 pg (ref 26.0–34.0)
MCHC: 33.7 g/dL (ref 30.0–36.0)
MCV: 88.3 fL (ref 78.0–100.0)
Platelets: 165 10*3/uL (ref 150–400)
RBC: 4.1 MIL/uL — ABNORMAL LOW (ref 4.22–5.81)
RDW: 11.9 % (ref 11.5–15.5)
WBC: 10.5 10*3/uL (ref 4.0–10.5)

## 2015-06-05 LAB — URINE MICROSCOPIC-ADD ON

## 2015-06-05 LAB — COMPREHENSIVE METABOLIC PANEL
ALT: 24 U/L (ref 17–63)
AST: 19 U/L (ref 15–41)
Albumin: 4.2 g/dL (ref 3.5–5.0)
Alkaline Phosphatase: 59 U/L (ref 38–126)
Anion gap: 12 (ref 5–15)
BUN: 22 mg/dL — ABNORMAL HIGH (ref 6–20)
CO2: 26 mmol/L (ref 22–32)
Calcium: 9.4 mg/dL (ref 8.9–10.3)
Chloride: 101 mmol/L (ref 101–111)
Creatinine, Ser: 1.07 mg/dL (ref 0.61–1.24)
GFR calc Af Amer: >60 mL/min (ref >60)
GFR calc non Af Amer: >60 mL/min (ref >60)
Glucose, Bld: 109 mg/dL — ABNORMAL HIGH (ref 65–99)
Potassium: 4.2 mmol/L (ref 3.5–5.1)
Sodium: 139 mmol/L (ref 135–145)
Total Bilirubin: 1.6 mg/dL — ABNORMAL HIGH (ref 0.3–1.2)
Total Protein: 7.5 g/dL (ref 6.5–8.1)

## 2015-06-05 LAB — URINALYSIS, ROUTINE W REFLEX MICROSCOPIC
Bilirubin Urine: NEGATIVE
Glucose, UA: NEGATIVE mg/dL
Ketones, ur: 15 mg/dL — AB
Leukocytes, UA: NEGATIVE
Nitrite: NEGATIVE
Protein, ur: NEGATIVE mg/dL
Specific Gravity, Urine: 1.024 (ref 1.005–1.030)
pH: 5 (ref 5.0–8.0)

## 2015-06-05 LAB — GLUCOSE, CSF: Glucose, CSF: 45 mg/dL (ref 40–70)

## 2015-06-05 LAB — PROTIME-INR
INR: 1.17 (ref 0.00–1.49)
Prothrombin Time: 15.1 seconds (ref 11.6–15.2)

## 2015-06-05 LAB — CSF CELL COUNT WITH DIFFERENTIAL
Lymphs, CSF: 16 % — ABNORMAL LOW (ref 40–80)
Lymphs, CSF: 21 % — ABNORMAL LOW (ref 40–80)
Monocyte-Macrophage-Spinal Fluid: 3 % — ABNORMAL LOW (ref 15–45)
Monocyte-Macrophage-Spinal Fluid: 9 % — ABNORMAL LOW (ref 15–45)
RBC Count, CSF: 166 /mm3 — ABNORMAL HIGH
RBC Count, CSF: 395 /mm3 — ABNORMAL HIGH
Segmented Neutrophils-CSF: 70 % — ABNORMAL HIGH (ref 0–6)
Segmented Neutrophils-CSF: 81 % — ABNORMAL HIGH (ref 0–6)
Tube #: 1
Tube #: 4
WBC, CSF: 163 /mm3 (ref 0–5)
WBC, CSF: 76 /mm3 (ref 0–5)

## 2015-06-05 LAB — LIPASE, BLOOD: Lipase: 16 U/L (ref 11–51)

## 2015-06-05 LAB — PROTEIN, CSF: Total  Protein, CSF: 249 mg/dL — ABNORMAL HIGH (ref 15–45)

## 2015-06-05 LAB — APTT: aPTT: 31 seconds (ref 24–37)

## 2015-06-05 LAB — LACTIC ACID, PLASMA: Lactic Acid, Venous: 0.9 mmol/L (ref 0.5–2.0)

## 2015-06-05 LAB — PATHOLOGIST SMEAR REVIEW

## 2015-06-05 LAB — PROCALCITONIN: Procalcitonin: <0.1 ng/mL

## 2015-06-05 MED ORDER — LACTULOSE 10 GM/15ML PO SOLN
3.3333 g | Freq: Every day | ORAL | Status: DC | PRN
  Administered 2015-06-07: 6.6 g via ORAL
  Administered 2015-06-11 – 2015-06-14 (×3): 6.6667 g via ORAL
  Filled 2015-06-05 (×7): qty 15

## 2015-06-05 MED ORDER — ACETAMINOPHEN 500 MG PO TABS
1000.0000 mg | ORAL_TABLET | Freq: Four times a day (QID) | ORAL | Status: DC | PRN
  Administered 2015-06-12 – 2015-06-13 (×3): 1000 mg via ORAL
  Filled 2015-06-05 (×3): qty 2

## 2015-06-05 MED ORDER — HYDROMORPHONE HCL 1 MG/ML IJ SOLN
1.0000 mg | Freq: Once | INTRAMUSCULAR | Status: AC
  Administered 2015-06-05: 1 mg via INTRAVENOUS
  Filled 2015-06-05: qty 1

## 2015-06-05 MED ORDER — PROMETHAZINE HCL 25 MG PO TABS: 25.0000 mg | ORAL_TABLET | Freq: Three times a day (TID) | ORAL | Status: DC | PRN

## 2015-06-05 MED ORDER — GABAPENTIN 300 MG PO CAPS
300.0000 mg | ORAL_CAPSULE | Freq: Three times a day (TID) | ORAL | Status: DC
  Administered 2015-06-05 – 2015-06-15 (×30): 300 mg via ORAL
  Filled 2015-06-05 (×32): qty 1

## 2015-06-05 MED ORDER — SODIUM CHLORIDE 0.9 % IJ SOLN
3.0000 mL | Freq: Two times a day (BID) | INTRAMUSCULAR | Status: DC
  Administered 2015-06-06 – 2015-06-15 (×9): 3 mL via INTRAVENOUS

## 2015-06-05 MED ORDER — ZOLPIDEM TARTRATE 5 MG PO TABS
5.0000 mg | ORAL_TABLET | ORAL | Status: AC | PRN
  Administered 2015-06-05 – 2015-06-06 (×2): 5 mg via ORAL
  Filled 2015-06-05 (×2): qty 1

## 2015-06-05 MED ORDER — KETOROLAC TROMETHAMINE 30 MG/ML IJ SOLN
30.0000 mg | Freq: Four times a day (QID) | INTRAMUSCULAR | Status: AC | PRN
  Administered 2015-06-05 – 2015-06-08 (×7): 30 mg via INTRAVENOUS
  Filled 2015-06-05 (×7): qty 1

## 2015-06-05 MED ORDER — PROCHLORPERAZINE EDISYLATE 5 MG/ML IJ SOLN
10.0000 mg | Freq: Once | INTRAMUSCULAR | Status: AC
  Administered 2015-06-05: 10 mg via INTRAVENOUS
  Filled 2015-06-05: qty 2

## 2015-06-05 MED ORDER — SODIUM CHLORIDE 0.9 % IV SOLN
INTRAVENOUS | Status: DC
  Administered 2015-06-05: 07:00:00 via INTRAVENOUS

## 2015-06-05 MED ORDER — ONDANSETRON 4 MG PO TBDP
4.0000 mg | ORAL_TABLET | Freq: Once | ORAL | Status: AC | PRN
  Administered 2015-06-05: 4 mg via ORAL
  Filled 2015-06-05: qty 1

## 2015-06-05 MED ORDER — DEXAMETHASONE SODIUM PHOSPHATE 10 MG/ML IJ SOLN
10.0000 mg | Freq: Four times a day (QID) | INTRAMUSCULAR | Status: DC
  Administered 2015-06-05 – 2015-06-08 (×7): 10 mg via INTRAVENOUS
  Filled 2015-06-05 (×13): qty 1

## 2015-06-05 MED ORDER — ALBUTEROL SULFATE (2.5 MG/3ML) 0.083% IN NEBU: 2.5000 mg | INHALATION_SOLUTION | RESPIRATORY_TRACT | Status: DC | PRN

## 2015-06-05 MED ORDER — PROMETHAZINE HCL 25 MG/ML IJ SOLN
25.0000 mg | Freq: Three times a day (TID) | INTRAMUSCULAR | Status: DC | PRN
  Administered 2015-06-05: 25 mg via INTRAVENOUS
  Filled 2015-06-05: qty 1

## 2015-06-05 MED ORDER — ENOXAPARIN SODIUM 40 MG/0.4ML ~~LOC~~ SOLN
40.0000 mg | SUBCUTANEOUS | Status: DC
  Filled 2015-06-05: qty 0.4

## 2015-06-05 MED ORDER — OXYCODONE-ACETAMINOPHEN 5-325 MG PO TABS
1.0000 | ORAL_TABLET | Freq: Four times a day (QID) | ORAL | Status: DC | PRN
  Administered 2015-06-07 – 2015-06-08 (×3): 1 via ORAL
  Filled 2015-06-05 (×4): qty 1

## 2015-06-05 MED ORDER — DEXTROSE 5 % IV SOLN
2.0000 g | Freq: Two times a day (BID) | INTRAVENOUS | Status: DC
  Administered 2015-06-05 – 2015-06-10 (×10): 2 g via INTRAVENOUS
  Filled 2015-06-05 (×12): qty 2

## 2015-06-05 MED ORDER — DEXTROSE 5 % IV SOLN
650.0000 mg | Freq: Three times a day (TID) | INTRAVENOUS | Status: DC
  Administered 2015-06-05 – 2015-06-08 (×9): 650 mg via INTRAVENOUS
  Filled 2015-06-05 (×5): qty 13
  Filled 2015-06-05: qty 10
  Filled 2015-06-05 (×4): qty 13

## 2015-06-05 MED ORDER — DEXTROSE 5 % IV SOLN: 2.0000 g | INTRAVENOUS | Status: DC

## 2015-06-05 MED ORDER — ONDANSETRON 4 MG PO TBDP
4.0000 mg | ORAL_TABLET | Freq: Three times a day (TID) | ORAL | Status: AC | PRN
  Administered 2015-06-05: 4 mg via ORAL
  Filled 2015-06-05: qty 1

## 2015-06-05 MED ORDER — ENOXAPARIN SODIUM 40 MG/0.4ML ~~LOC~~ SOLN
40.0000 mg | SUBCUTANEOUS | Status: DC
  Administered 2015-06-08 – 2015-06-15 (×8): 40 mg via SUBCUTANEOUS
  Filled 2015-06-05 (×10): qty 0.4

## 2015-06-05 MED ORDER — METHOCARBAMOL 750 MG PO TABS
750.0000 mg | ORAL_TABLET | Freq: Three times a day (TID) | ORAL | Status: DC | PRN
  Administered 2015-06-07 – 2015-06-15 (×13): 750 mg via ORAL
  Filled 2015-06-05: qty 1
  Filled 2015-06-05: qty 2
  Filled 2015-06-05 (×2): qty 1
  Filled 2015-06-05 (×2): qty 2
  Filled 2015-06-05: qty 1
  Filled 2015-06-05: qty 2
  Filled 2015-06-05 (×5): qty 1

## 2015-06-05 MED ORDER — PROMETHAZINE HCL 25 MG/ML IJ SOLN
25.0000 mg | INTRAMUSCULAR | Status: DC | PRN
  Administered 2015-06-05 – 2015-06-14 (×6): 25 mg via INTRAVENOUS
  Filled 2015-06-05 (×6): qty 1

## 2015-06-05 MED ORDER — OXYCODONE-ACETAMINOPHEN 10-325 MG PO TABS: 1.0000 | ORAL_TABLET | Freq: Four times a day (QID) | ORAL | Status: DC | PRN

## 2015-06-05 MED ORDER — SODIUM CHLORIDE 0.9 % IV SOLN
INTRAVENOUS | Status: DC
  Administered 2015-06-05 – 2015-06-08 (×4): via INTRAVENOUS

## 2015-06-05 MED ORDER — VANCOMYCIN HCL 10 G IV SOLR
2000.0000 mg | INTRAVENOUS | Status: AC
  Administered 2015-06-05: 2000 mg via INTRAVENOUS
  Filled 2015-06-05: qty 2000

## 2015-06-05 MED ORDER — MORPHINE SULFATE (PF) 2 MG/ML IV SOLN
2.0000 mg | INTRAVENOUS | Status: DC | PRN
  Administered 2015-06-05 – 2015-06-07 (×10): 2 mg via INTRAVENOUS
  Filled 2015-06-05 (×10): qty 1

## 2015-06-05 MED ORDER — VANCOMYCIN HCL IN DEXTROSE 1-5 GM/200ML-% IV SOLN
1000.0000 mg | Freq: Three times a day (TID) | INTRAVENOUS | Status: DC
  Administered 2015-06-06 – 2015-06-08 (×8): 1000 mg via INTRAVENOUS
  Filled 2015-06-05 (×9): qty 200

## 2015-06-05 MED ORDER — OXYCODONE HCL 5 MG PO TABS
5.0000 mg | ORAL_TABLET | Freq: Four times a day (QID) | ORAL | Status: DC | PRN
Start: 2015-06-05 — End: 2015-06-09
  Administered 2015-06-07 – 2015-06-09 (×4): 5 mg via ORAL
  Filled 2015-06-05 (×5): qty 1

## 2015-06-05 MED ORDER — DEXTROSE 5 % IV SOLN
2.0000 g | Freq: Once | INTRAVENOUS | Status: AC
  Administered 2015-06-05: 2 g via INTRAVENOUS
  Filled 2015-06-05: qty 2

## 2015-06-05 MED ORDER — KETOROLAC TROMETHAMINE 30 MG/ML IJ SOLN
30.0000 mg | Freq: Once | INTRAMUSCULAR | Status: AC
  Administered 2015-06-05: 30 mg via INTRAVENOUS
  Filled 2015-06-05: qty 1

## 2015-06-05 MED ORDER — VANCOMYCIN HCL IN DEXTROSE 1-5 GM/200ML-% IV SOLN: 1000.0000 mg | Freq: Once | INTRAVENOUS | Status: DC

## 2015-06-05 NOTE — ED Notes (Signed)
Pt states he has been having nausea, vomiting, and diarrhea along with a headache since Tuesday  Pt states this is the worst headache of his life  Pt states he has not been able to sleep much for the past 3 days  Pt states he feels dehydrated and weak  Pt states he was seen at urgent care yesterday and was given a bag of IVF and some phenergan [facility]  [facility] and he could not sit there that long  Pt has chronic back issues in which he sees a neurologist for

## 2015-06-05 NOTE — ED Notes (Signed)
All CSF collected in X-Ray

## 2015-06-05 NOTE — H&P (Addendum)
[facility] History and Physical  Bruce Morales TMH:[id number] DOB: 1976-01-24 DOA: 06/05/2015  Referring physician: ED physician, Dr. Tomi Morales  PCP: Bruce Pain, DO   Chief Complaint: headaches   HPI:  Pt is 40 yo male who presented to [facility] ED with main concern of 3 days duration of progressively worsening generalized and diffuse headaches. Pt explains that headaches were initially intermittent but now constant and throbbing, 10/10 in severity, occasionally but not consistently radiating to the neck area, associated with photophobia and phonophobia, subjective fevers and chills, nausea and several episodes of non bloody vomiting, malaise, no specific alleviating factors. Pt denies recent sick contacts or exposures, no similar events in the past. He was seen in ED 1/4 and was given Toradol and phenergan, he got better and was discharged home only to come back with worsening symptoms.   In Ed, pt was in discomfort due to headaches, has wash clot on his head and is unable to oopen his eyes much due to significant photophobia. LP done earlier and preliminary findings on CSF with protein level 249 (elevated), WBC 163, RBC 395. [facility] asked to admit pt for further evaluation. Please note that pt was given one dose of Rocephin 2 g IV while in ED.   Assessment and Plan:  Principal Problem:   Severe headache secondary to likely bacterial meningitis - preliminary CSF analysis discussed with Dr. Janann Colonel neurologist, worrisome for bacterial meningitis - pt started on vancomycin and rocephin, meningitis order set in place - also started on dexamethasone per protocol for meningitis until pneumococcal meningitis ruled out  - added empiric Acyclovir until HSV ruled out - this was discussed with Dr. Janann Colonel   Active Problems:   Sepsis ([place]) secondary to bacterial meningitis - pt met criteria for sepsis - ABX as noted above - follow up on blood cultures, CSF analysis, HIV, HSV - narrow down ABX  as clinically indicated   Radiological Exams on Admission: Ct Head Wo Contrast 06/05/2015  No acute intracranial abnormality noted.   Dg Fluoro Guide Lumbar Puncture 06/05/2015  Technically successful lumbar puncture. The opening pressure could not be obtained. 15.5 cc of CSF were removed and submitted to the laboratory   Code Status: Full Family Communication: Pt at bedside Disposition Plan: Admit for further evaluation    [name] [phone]   Review of Systems:  Constitutional: Negative for fever, chills and malaise/fatigue. Negative for diaphoresis.  HENT: Negative for hearing loss, ear Morales, nosebleeds, congestion, sore throat, neck Morales, tinnitus and ear discharge.   Eyes: Negative for blurred vision, double vision, photophobia, Morales, discharge and redness.  Respiratory: Negative for cough, hemoptysis, sputum production, shortness of breath, wheezing and stridor.   Cardiovascular: Negative for chest Morales, palpitations, orthopnea, claudication and leg swelling.  Gastrointestinal: Negative for nausea, vomiting and abdominal Morales. Negative for heartburn, constipation, blood in stool and melena.  Genitourinary: Negative for dysuria, urgency, frequency, hematuria and flank Morales.  Musculoskeletal: Negative for myalgias, back Morales, joint Morales and falls.  Skin: Negative for itching and rash.  Neurological: Negative for dizziness and weakness. Negative for tingling, tremors, sensory change, speech change, focal weakness, loss of consciousness and headaches.  Endo/Heme/Allergies: Negative for environmental allergies and polydipsia. Does not bruise/bleed easily.  Psychiatric/Behavioral: Negative for suicidal ideas. The patient is not nervous/anxious.      Past Medical History  Diagnosis Date  . Gall stones   . Chronic lower back Morales   . Anginal Morales ([place]) 04/15/2014  . Chronic bronchitis (  [place])     "probably get it q yr"  . Arthritis     "wrists, knees, lower back" (04/16/2014)   . Chronic lower back Morales     Past Surgical History  Procedure Laterality Date  . Cholecystectomy  2011  . Knee arthroscopy Right 2013 X 2  . Esophagogastroduodenoscopy N/A 02/27/2013    Procedure: ESOPHAGOGASTRODUODENOSCOPY (EGD);  Surgeon: Cleotis Nipper, MD;  Location: [facility];  Service: Endoscopy;  Laterality: N/A;  . Left heart catheterization with coronary angiogram N/A 04/17/2014    Procedure: LEFT HEART CATHETERIZATION WITH CORONARY ANGIOGRAM;  Surgeon: Birdie Riddle, MD;  Location: [facility];  Service: Cardiovascular;  Laterality: N/A;    Social History:  reports that he quit smoking about 2 months ago. His smoking use included Cigarettes. He has a 13.5 pack-year smoking history. He has quit using smokeless tobacco. He reports that he drinks alcohol. He reports that he uses illicit drugs.  Allergies  Allergen Reactions  . Hydrocodone Hives and Itching  . Oxycodone     Mild Itching, patient can tolerate oxycodone  . Percocet [Oxycodone-Acetaminophen]     Itching     Family History  Problem Relation Age of Onset  . Multiple sclerosis Mother   . Heart failure Maternal Grandfather   . Diabetes Father   . Fibromyalgia Sister   . Schizophrenia Brother   . Stroke Maternal Grandmother   . Diabetes Paternal Grandmother     Prior to Admission medications   Medication Sig Start Date End Date Taking? Authorizing Provider  acetaminophen (TYLENOL) 500 MG tablet Take 1,000 mg by mouth every 6 (six) hours as needed for moderate Morales or headache.   Yes Historical Provider, MD  albuterol (PROVENTIL HFA;VENTOLIN HFA) 108 (90 BASE) MCG/ACT inhaler Inhale 2 puffs into the lungs every 4 (four) hours as needed (cough, shortness of breath or wheezing.). 04/30/15  Yes Stephanie D English, PA  gabapentin (NEURONTIN) 300 MG capsule Take 1 capsule by mouth 3 (three) times daily. 02/18/15  Yes Historical Provider, MD  HYDROmorphone (DILAUDID) 4 MG tablet TAKE 1 TABLET BY MOUTH EVERY 4  TO 6 HOURS AS  FOR Morales 05/08/15  Yes Historical Provider, MD  lactulose (CHRONULAC) 10 GM/15ML solution Take 5-10 mLs by mouth daily as needed. 06/03/15  Yes Historical Provider, MD  meloxicam (MOBIC) 15 MG tablet Take 7.5 mg by mouth daily as needed for Morales.    Yes Historical Provider, MD  methocarbamol (ROBAXIN) 750 MG tablet Take 750 mg by mouth every 8 (eight) hours as needed. 03/17/15  Yes Historical Provider, MD  MOVANTIK 25 MG TABS tablet take 40m daily 05/07/15  Yes Historical Provider, MD  cyclobenzaprine (FLEXERIL) 10 MG tablet Take 1 tablet (10 mg total) by mouth 2 (two) times daily as needed for muscle spasms. Patient not taking: Reported on 06/04/2015 04/21/15   NDavonna Belling MD  oxyCODONE-acetaminophen (PERCOCET) 10-325 MG tablet Take 1 tablet by mouth every 6 (six) hours as needed for Morales. Reported on 06/04/2015    Historical Provider, MD  promethazine (PHENERGAN) 25 MG tablet Take 1 tablet (25 mg total) by mouth every 8 (eight) hours as needed for nausea or vomiting. 06/04/15   TGlenford Bayley DO    Physical Exam: Filed Vitals:   06/05/15 0683701/05/17 0629 06/05/15 0854 06/05/15 1030  BP: 127/88  113/56 120/70  Pulse: 88  70 70  Temp:  99.9 F (37.7 C)    TempSrc:  Oral    Resp: 18  18   SpO2: 100%  95% 95%    Physical Exam  Constitutional: Appears to be in mild distress due to headache  HENT: Normocephalic. External right and left ear normal. Dry MM Eyes: PERRLA, no scleral icterus.  Neck: No JVD. No tracheal deviation. No thyromegaly. Neck painful but able to rotate gently  CVS: RRR, S1/S2 +, no gallops, no carotid bruit.  Pulmonary: Effort and breath sounds normal, no stridor, rhonchi, wheezes, rales.  Abdominal: Soft. BS +,  no distension, tenderness, rebound or guarding.  Musculoskeletal: Normal range of motion. No edema and no tenderness.  Lymphadenopathy: No lymphadenopathy noted, cervical, inguinal. Neuro: Alert.  Moving all 4 extremities spontaneously  Skin: Skin  is warm and dry. No rash noted. Not diaphoretic. No erythema. No pallor.  Psychiatric: Normal mood and affect.   Labs on Admission:  Basic Metabolic Panel:  Recent Labs Lab 06/04/15 1707 06/04/15 2006 06/05/15 0531  NA 136 137 139  K 4.4 3.9 4.2  CL 98 103 101  CO2 '22 22 26  ' GLUCOSE 95 109* 109*  BUN 18 17 22*  CREATININE 1.11 1.11 1.07  CALCIUM 9.7 9.1 9.4   Liver Function Tests:  Recent Labs Lab 06/04/15 1707 06/04/15 2006 06/05/15 0531  AST '17 20 19  ' ALT '27 28 24  ' ALKPHOS 69 60 59  BILITOT 1.7* 1.8* 1.6*  PROT 7.3 6.7 7.5  ALBUMIN 4.4 3.5 4.2    Recent Labs Lab 06/04/15 1707 06/04/15 2006 06/05/15 0531  LIPASE '14 26 16   ' CBC:  Recent Labs Lab 06/04/15 1715 06/04/15 2006 06/05/15 0531  WBC 12.9* 12.8* 10.5  HGB 13.1* 12.1* 12.2*  HCT 38.3* 35.2* 36.2*  MCV 85.8 85.9 88.3  PLT  --  173 165   EKG: pending   If 7PM-7AM, please contact night-coverage [email or web address] Password [email or web address] 06/05/2015, 12:31 PM

## 2015-06-05 NOTE — ED Notes (Signed)
Patient transported to X-ray 

## 2015-06-05 NOTE — ED Notes (Signed)
Patient transported to CT 

## 2015-06-05 NOTE — ED Notes (Signed)
Patient states he was given IV fluids yesterday.

## 2015-06-05 NOTE — Progress Notes (Signed)
ANTIBIOTIC CONSULT NOTE - INITIAL  Pharmacy Consult for Vancomycin, Acyclovir Indication: r/o Meningitis  Allergies  Allergen Reactions  . Hydrocodone Hives and Itching  . Oxycodone     Mild Itching, patient can tolerate oxycodone  . Percocet [Oxycodone-Acetaminophen]     Itching     Patient Measurements:    Last documented weight 99.3kg Ideal weight ~ 66 kg  Vital Signs: Temp: 99.3 F (37.4 C) (01/05 1721) Temp Source: Oral (01/05 1721) BP: 138/77 mmHg (01/05 1721) Pulse Rate: 84 (01/05 1721)  Labs:  Recent Labs  06/04/15 1707 06/04/15 1715 06/04/15 2006 06/05/15 0531  WBC  --  12.9* 12.8* 10.5  HGB  --  13.1* 12.1* 12.2*  PLT  --   --  173 165  CREATININE 1.11  --  1.11 1.07   Estimated Creatinine Clearance: 106.2 mL/min (by C-G formula based on Cr of 1.07). No results for input(s): VANCOTROUGH, VANCOPEAK, VANCORANDOM, GENTTROUGH, GENTPEAK, GENTRANDOM, TOBRATROUGH, TOBRAPEAK, TOBRARND, AMIKACINPEAK, AMIKACINTROU, AMIKACIN in the last 72 hours.   Microbiology: Recent Results (from the past 720 hour(s))  CSF culture     Status: None (Preliminary result)   Collection Time: 06/05/15 10:48 AM  Result Value Ref Range Status   Specimen Description CSF  Final   Special Requests NONE  Final   Gram Stain   Final    CYTOSPIN WBC PRESENT,BOTH PMN AND MONONUCLEAR NO ORGANISMS SEEN Gram Stain Report Called to,Read Back By and Verified With: DR KNAPP AT 1212 ON 1.5.17 BYSHUEA    Culture PENDING  Incomplete   Report Status PENDING  Incomplete    Medical History: Past Medical History  Diagnosis Date  . Gall stones   . Chronic lower back pain   . Anginal pain ([facility]) 04/15/2014  . Chronic bronchitis ([facility])     "probably get it q yr"  . Arthritis     "wrists, knees, lower back" (04/16/2014)  . Chronic lower back pain     Assessment: [age] yoM presented to ED on 1/4 with N/V/D with severe headache.  Initial LP results concerning for bacterial meningitis.  Ceftriaxone  has been started in ED.  Pharmacy is consulted to dose Vancomycin and Acyclovir.  Today, 06/05/2015: Tm 101.1 WBC 10.5 (decreased) SCr 1.07 with CrCl > 100 ml/min  Antimicrobials this admission: 1/5 >> Ceftriaxone >> 1/5 >> Vancomycin >> 1/5 >> Acyclovir >>  Levels/dose changes this admission: None  Microbiology Results: 1/5 BCx: pending 1/5 CSF culture: pending  Goal of Therapy:  Vancomycin trough level 15-20 mcg/ml  Plan:   Acyclovir 650 mg IV q8h  Vancomycin 2g IV STAT, then 1g IV q8h.  Measure Vanc trough at steady state.  Follow up renal fxn, culture results, and clinical course.   Lynann Beaverhristine Aylee Littrell PharmD, BCPS Pager [phone] 06/05/2015 5:23 PM

## 2015-06-05 NOTE — ED Provider Notes (Addendum)
CSN: [id number]     Arrival date & time 06/05/15  0444 History   First MD Initiated Contact with Patient 06/05/15 0700     Chief Complaint  Patient presents with  . Headache  . Emesis  . Back Pain   Patient is a 40 y.o. male presenting with headaches, vomiting, and back pain.  Headache Associated symptoms: back pain and vomiting   Emesis Associated symptoms: headaches   Back Pain Associated symptoms: headaches    Pt complains of a headache that has been ongoing for the last 72 hours.   The headache is an intense pressure that is diffuse from the back of his neck and head to the front. . Pt went to an urgent care for treatment and they sent him to the ED.  The wait time was long so he decided to leave.  He was given toradol and phenergan and got better but the symptoms returned.  His back is bothering him as well because he has not been able to take his medications.    He continues to feel dehydrated.  He has been vomiting.  He has been having fevers.  No cough.  No sore throat.  Denies prior history of headaches. Past Medical History  Diagnosis Date  . Gall stones   . Chronic lower back pain   . Anginal pain ([facility]) 04/15/2014  . Chronic bronchitis ([facility])     "probably get it q yr"  . Arthritis     "wrists, knees, lower back" (04/16/2014)  . Chronic lower back pain    Past Surgical History  Procedure Laterality Date  . Cholecystectomy  2011  . Knee arthroscopy Right 2013 X 2  . Esophagogastroduodenoscopy N/A 02/27/2013    Procedure: ESOPHAGOGASTRODUODENOSCOPY (EGD);  Surgeon: Florencia Reasons, MD;  Location: [facility];  Service: Endoscopy;  Laterality: N/A;  . Left heart catheterization with coronary angiogram N/A 04/17/2014    Procedure: LEFT HEART CATHETERIZATION WITH CORONARY ANGIOGRAM;  Surgeon: Ricki Rodriguez, MD;  Location: [facility];  Service: Cardiovascular;  Laterality: N/A;   Family History  Problem Relation Age of Onset  . Multiple sclerosis Mother   . Heart failure  Maternal Grandfather   . Diabetes Father   . Fibromyalgia Sister   . Schizophrenia Brother   . Stroke Maternal Grandmother   . Diabetes Paternal Grandmother    Social History  Substance Use Topics  . Smoking status: Former Smoker -- 0.50 packs/day for 27 years    Types: Cigarettes    Quit date: 03/11/2015  . Smokeless tobacco: Former Neurosurgeon  . Alcohol Use: 0.0 oz/week    0 Standard drinks or equivalent per week     Comment: 04/16/2014 "a 6 pack will last me a month"    Review of Systems  Gastrointestinal: Positive for vomiting.  Musculoskeletal: Positive for back pain.  Neurological: Positive for headaches.  All other systems reviewed and are negative.     Allergies  Hydrocodone; Oxycodone; and Percocet  Home Medications   Prior to Admission medications   Medication Sig Start Date End Date Taking? Authorizing Provider  acetaminophen-codeine (TYLENOL #3) 300-30 MG tablet Take 1 tablet by mouth every 4 (four) hours as needed for moderate pain.    Historical Provider, MD  albuterol (PROVENTIL HFA;VENTOLIN HFA) 108 (90 BASE) MCG/ACT inhaler Inhale 2 puffs into the lungs every 4 (four) hours as needed (cough, shortness of breath or wheezing.). Patient not taking: Reported on 06/04/2015 04/30/15   Garnetta Buddy, [place]  cyclobenzaprine (FLEXERIL) 10 MG tablet Take 1 tablet (10 mg total) by mouth 2 (two) times daily as needed for muscle spasms. Patient not taking: Reported on 06/04/2015 04/21/15   Benjiman Core, MD  gabapentin (NEURONTIN) 300 MG capsule Take 1 capsule by mouth 3 (three) times daily. 02/18/15   Historical Provider, MD  HYDROmorphone (DILAUDID) 4 MG tablet TAKE 1 TABLET BY MOUTH EVERY 4 TO 6 HOURS AS  FOR PAIN 05/08/15   Historical Provider, MD  meloxicam (MOBIC) 15 MG tablet Take 7.5 mg by mouth daily as needed for pain.     Historical Provider, MD  methocarbamol (ROBAXIN) 750 MG tablet Take 750 mg by mouth every 8 (eight) hours as needed. 03/17/15   Historical  Provider, MD  MOVANTIK 25 MG TABS tablet Reported on 06/04/2015 05/07/15   Historical Provider, MD  oxyCODONE-acetaminophen (PERCOCET) 10-325 MG tablet Take 1 tablet by mouth every 6 (six) hours as needed for pain. Reported on 06/04/2015    Historical Provider, MD  promethazine (PHENERGAN) 25 MG tablet Take 1 tablet (25 mg total) by mouth every 8 (eight) hours as needed for nausea or vomiting. 06/04/15   Thao P Le, DO   BP 127/88 mmHg  Pulse 88  Temp(Src) 99.9 F (37.7 C) (Oral)  Resp 18  SpO2 100% Physical Exam  Constitutional: He appears well-developed and well-nourished. He appears distressed.  HENT:  Head: Normocephalic and atraumatic.  Right Ear: External ear normal.  Left Ear: External ear normal.  Eyes: Conjunctivae are normal. Right eye exhibits no discharge. Left eye exhibits no discharge. No scleral icterus.  Neck: Decreased range of motion present. No tracheal deviation present.  Pain with neck flexion  Cardiovascular: Normal rate, regular rhythm and intact distal pulses.   Pulmonary/Chest: Effort normal and breath sounds normal. No stridor. No respiratory distress. He has no wheezes. He has no rales.  Abdominal: Soft. Bowel sounds are normal. He exhibits no distension. There is no tenderness. There is no rebound and no guarding.  Musculoskeletal: He exhibits no edema or tenderness.  Prior surgical scar, no erythema or fluctuance  Neurological: He is alert. He has normal strength. No cranial nerve deficit (no facial droop, extraocular movements intact, no slurred speech) or sensory deficit. He exhibits normal muscle tone. He displays no seizure activity. Coordination normal.  Skin: Skin is warm and dry. No rash noted.  Psychiatric: He has a normal mood and affect.  Nursing note and vitals reviewed.   ED Course  .Lumbar Puncture Date/Time: 06/05/2015 8:54 AM Performed by: Linwood Dibbles Authorized by: Linwood Dibbles Consent: Verbal consent obtained. Risks and benefits: risks, benefits  and alternatives were discussed Consent given by: patient Indications: evaluation for infection Anesthesia: local infiltration Local anesthetic: lidocaine 1% without epinephrine Anesthetic total: 5 ml Patient sedated: no Preparation: Patient was prepped and draped in the usual sterile fashion. Lumbar space: L3-L4 interspace Patient's position: left lateral decubitus Needle gauge: 20 Needle length: 3.5 in Number of attempts: 2 Post-procedure: site cleaned and adhesive bandage applied Patient tolerance: Patient tolerated the procedure well with no immediate complications Comments: Unsuccessful attempt.  No spinal fluid obtained.  Complicated by pts back surgery in November with scar tissue l4 l5.  Attempted proximal to the scar.   (including critical care time) Labs Review Labs Reviewed  COMPREHENSIVE METABOLIC PANEL - Abnormal; Notable for the following:    Glucose, Bld 109 (*)    BUN 22 (*)    Total Bilirubin 1.6 (*)    All other components within  normal limits  CBC - Abnormal; Notable for the following:    RBC 4.10 (*)    Hemoglobin 12.2 (*)    HCT 36.2 (*)    All other components within normal limits  URINALYSIS, ROUTINE W REFLEX MICROSCOPIC (NOT AT [facility]) - Abnormal; Notable for the following:    Color, Urine AMBER (*)    APPearance CLOUDY (*)    Hgb urine dipstick TRACE (*)    Ketones, ur 15 (*)    All other components within normal limits  URINE MICROSCOPIC-ADD ON - Abnormal; Notable for the following:    Squamous Epithelial / LPF 0-5 (*)    Bacteria, UA RARE (*)    All other components within normal limits  LIPASE, BLOOD    Imaging Review Ct Head Wo Contrast  06/05/2015  CLINICAL DATA:  Severe headaches for 3 days EXAM: CT HEAD WITHOUT CONTRAST TECHNIQUE: Contiguous axial images were obtained from the base of the skull through the vertex without intravenous contrast. COMPARISON:  None. FINDINGS: The bony calvarium is intact. The ventricles are of normal size and  configuration. No findings to suggest acute hemorrhage, acute infarction or space-occupying mass lesion are noted. IMPRESSION: No acute intracranial abnormality noted. Electronically Signed   By: Alcide CleverMark  Lukens M.D.   On: 06/05/2015 07:52   Dg Chest Port 1 View  06/05/2015  CLINICAL DATA:  Sepsis protocol. Severe headache and weakness. Fever for 3 days. EXAM: PORTABLE CHEST 1 VIEW COMPARISON:  04/21/2015 FINDINGS: The heart is normal in size. The mediastinal and hilar contours are normal. Diffuse increased interstitial markings and peribronchial thickening most likely reflecting bronchitis or interstitial pneumonitis. No focal airspace consolidation or joint effusion. The bony thorax is intact. IMPRESSION: Findings suggest bronchitis or interstitial pneumonitis. No focal infiltrate or effusion. Electronically Signed   By: Rudie MeyerP.  Gallerani M.D.   On: 06/05/2015 17:53   Dg Fluoro Guide Lumbar Puncture  06/05/2015  CLINICAL DATA:  Headache, fever and neck stiffness. EXAM: DIAGNOSTIC LUMBAR PUNCTURE UNDER FLUOROSCOPIC GUIDANCE FLUOROSCOPY TIME:  Radiation Exposure Index (as provided by the fluoroscopic device): If the device does not provide the exposure index: Fluoroscopy Time (in minutes and seconds):  2 minutes and 2 seconds Number of Acquired Images:  0 PROCEDURE: Informed consent was obtained from the patient prior to the procedure, including potential complications of headache, allergy, and pain. With the patient prone, the lower back was prepped with Betadine. 1% Lidocaine was used for local anesthesia. Lumbar puncture was performed at the L3-4 level using a 22 gauge needle with return of 15.5 CSF with an opening pressure of could not be obtained. Cm water. 15.5 ml of CSF were obtained for laboratory studies. The patient tolerated the procedure well and there were no apparent complications. IMPRESSION: 1. Technically successful lumbar puncture. The opening pressure could not be obtained. 2. 15.5 cc of CSF were  removed and submitted to the laboratory Electronically Signed   By: Signa Kellaylor  Stroud M.D.   On: 06/05/2015 11:10   I have personally reviewed and evaluated these images and lab results as part of my medical decision-making.    MDM   Final diagnoses:  Fever  Meningitis    Pt sx concerning for meningitis.  Initial attempt at LP unsuccessful by me.  Pt had scarring in the area associated with his recent back surgery.  Dr Ladona Ridgelaylor performed a fluoro guided LP.   Preliminary results showed elevated WBC and protein in the CSF.   GRam stain positive for WBC but no  bacteria.  Suspect viral meningitis.  Started empiric abx to cover for bacterial while cultures and additional studies pending.  Pt given pain meds and 2 gm rocephin.  Admitted in stable condition.   Linwood Dibbles, MD 06/06/15 [phone]  Admitted PE that was performed but not documented on the day of the visit.  Linwood Dibbles, MD 06/25/15 1140

## 2015-06-06 DIAGNOSIS — G44011 Episodic cluster headache, intractable: Secondary | ICD-10-CM

## 2015-06-06 LAB — CBC
HCT: 33.9 % — ABNORMAL LOW (ref 39.0–52.0)
Hemoglobin: 11.4 g/dL — ABNORMAL LOW (ref 13.0–17.0)
MCH: 29.2 pg (ref 26.0–34.0)
MCHC: 33.6 g/dL (ref 30.0–36.0)
MCV: 86.9 fL (ref 78.0–100.0)
Platelets: 175 10*3/uL (ref 150–400)
RBC: 3.9 MIL/uL — ABNORMAL LOW (ref 4.22–5.81)
RDW: 11.8 % (ref 11.5–15.5)
WBC: 7.2 10*3/uL (ref 4.0–10.5)

## 2015-06-06 LAB — LACTIC ACID, PLASMA: Lactic Acid, Venous: 1 mmol/L (ref 0.5–2.0)

## 2015-06-06 LAB — BASIC METABOLIC PANEL
Anion gap: 11 (ref 5–15)
BUN: 18 mg/dL (ref 6–20)
CO2: 25 mmol/L (ref 22–32)
Calcium: 8.9 mg/dL (ref 8.9–10.3)
Chloride: 105 mmol/L (ref 101–111)
Creatinine, Ser: 0.89 mg/dL (ref 0.61–1.24)
GFR calc Af Amer: >60 mL/min (ref >60)
GFR calc non Af Amer: >60 mL/min (ref >60)
Glucose, Bld: 158 mg/dL — ABNORMAL HIGH (ref 65–99)
Potassium: 4.3 mmol/L (ref 3.5–5.1)
Sodium: 141 mmol/L (ref 135–145)

## 2015-06-06 LAB — VANCOMYCIN, TROUGH: Vancomycin Tr: 20 ug/mL (ref 10.0–20.0)

## 2015-06-06 LAB — HIV ANTIBODY (ROUTINE TESTING W REFLEX): HIV Screen 4th Generation wRfx: NONREACTIVE

## 2015-06-06 LAB — HERPES SIMPLEX VIRUS(HSV) DNA BY PCR
HSV 1 DNA: NEGATIVE
HSV 2 DNA: NEGATIVE

## 2015-06-06 MED ORDER — HYDROMORPHONE HCL 1 MG/ML IJ SOLN
1.0000 mg | Freq: Once | INTRAMUSCULAR | Status: AC
  Administered 2015-06-06: 1 mg via INTRAVENOUS
  Filled 2015-06-06: qty 1

## 2015-06-06 NOTE — Progress Notes (Signed)
Pharmacy Antibiotic Follow-up Note  Bruce Morales is a 40 y.o. year-old male admitted on 06/05/2015.  The patient is currently on day 1 of Rocephin 2gm IV q12h + Vancomycin1gm IV q8h + Acyclovir 650mg  IV q8h for meningitis.  Assessment/Plan: This patient's current antibiotics will be continued without adjustments.  Check Vancomycin trough prior next dose Monitor renal function and cx data   Temp (24hrs), Avg:98.5 F (36.9 C), Min:97.8 F (36.6 C), Max:99.3 F (37.4 C)   Recent Labs Lab 06/04/15 1715 06/04/15 2006 06/05/15 0531 06/06/15 0528  WBC 12.9* 12.8* 10.5 7.2    Recent Labs Lab 06/04/15 1707 06/04/15 2006 06/05/15 0531 06/06/15 0528  CREATININE 1.11 1.11 1.07 0.89   Estimated Creatinine Clearance: 127.6 mL/min (by C-G formula based on Cr of 0.89).    Allergies  Allergen Reactions  . Hydrocodone Hives and Itching  . Oxycodone     Mild Itching, patient can tolerate oxycodone  . Percocet [Oxycodone-Acetaminophen]     Itching     Antimicrobials this admission: 1/5 >> Ceftriaxone >> 1/5 >> Vancomycin >> 1/5 >> Acyclovir >>  Levels/dose changes this admission: 1/6 at 1830: ______ on Vanc 1gm IV q8h  Microbiology results: 1/5 BCx: NGTD 1/5 CSF culture: NGTD  Thank you for allowing pharmacy to be a part of this patient's care.  Elson ClanLilliston, Madelyn Tlatelpa Michelle PharmD 06/06/2015 1:15 PM

## 2015-06-06 NOTE — Care Management Note (Signed)
Case Management Note  Patient Details  Name: Bruce Morales MRN: [id number] Date of Birth: [birth year]  Subjective/Objective:39 y/o m admitted w/Meningitis. From home.                    Action/Plan:d/c plan home.   Expected Discharge Date:   (UNKNOWN)               Expected Discharge Plan:  Home/Self Care  In-House Referral:     Discharge planning Services  CM Consult  Post Acute Care Choice:    Choice offered to:     DME Arranged:    DME Agency:     HH Arranged:    HH Agency:     Status of Service:  In process, will continue to follow  Medicare Important Message Given:    Date Medicare IM Given:    Medicare IM give by:    Date Additional Medicare IM Given:    Additional Medicare Important Message give by:     If discussed at Long Length of Stay Meetings, dates discussed:    Additional Comments:  Lanier ClamMahabir, Kishan [place], RN 06/06/2015, 3:13 PM

## 2015-06-06 NOTE — Progress Notes (Signed)
Rx Brief Antibiotic note:  IV Vancomycin  See 1/6 note by A Lilliston for full details  Assessment:  VT=20 mg/L after 1Gm q8h  (~6 hour level)  Scr improving  At goal of 15-20 mg/L for meningitis  Plan:  Continue Vancomycin 1Gm IV q8h  F/u SCr/cultures/additional levels as needed  Lorenza EvangelistGreen, Keith Cancio R 06/06/2015 7:51 PM

## 2015-06-06 NOTE — Progress Notes (Addendum)
Patient ID: Bruce Morales, male   DOB: June 12, 1975, 40 y.o.   MRN: [id number]  [facility] PROGRESS NOTE  Bruce Morales:[id number] DOB: 02/09/1976 DOA: 06/05/2015 PCP: [name], DO   Brief narrative:    Pt is 40 yo male who presented to [facility] ED with main concern of 3 days duration of progressively worsening generalized and diffuse headaches. Pt explains that headaches were initially intermittent but now constant and throbbing, 10/10 in severity, occasionally but not consistently radiating to the neck area, associated with photophobia and phonophobia, subjective fevers and chills, nausea and several episodes of non bloody vomiting, malaise, no specific alleviating factors. Pt denies recent sick contacts or exposures, no similar events in the past. He was seen in ED 1/4 and was given Toradol and phenergan, he got better and was discharged home only to come back with worsening symptoms.   In Ed, pt was in discomfort due to headaches, has wash clot on his head and is unable to oopen his eyes much due to significant photophobia. LP done earlier and preliminary findings on CSF with protein level 249 (elevated), WBC 163, RBC 395. [facility] asked to admit pt for further evaluation. Please note that pt was given one dose of Rocephin 2 g IV while in ED.   Assessment/Plan:    Principal Problem:  Severe headache secondary to likely bacterial meningitis - preliminary CSF analysis discussed with Dr. Janann Colonel neurologist, worrisome for bacterial meningitis - pt started on vancomycin and rocephin, acyclovir, continue day #2 - also started on dexamethasone per protocol for meningitis until pneumococcal meningitis ruled out  - pt clinically improving, provide analgesia as needed - follow up on final CSF analysis   Active Problems:  Sepsis ([place]) secondary to bacterial meningitis - pt met criteria for sepsis - ABX as noted above - follow up on blood cultures, CSF analysis, HSV - narrow down  ABX as clinically indicated     Acute bronchitis - noted on CXR and pt reported some intermittently productive cough - current ABX should be adequate  - pt afebrile this AM and WBC is WNL   DVT prophylaxis - Lovenox SQ  Code Status: Full.  Family Communication:  plan of care discussed with the patient and wife at bedside  Disposition Plan: Home when CSF analysis is back   IV access:  Peripheral IV  Procedures and diagnostic studies:    Ct Head Wo Contrast 06/05/2015  No acute intracranial abnormality noted.   Ct Lumbar Spine W Contrast 05/16/2015 Sequela of interval decompressive right hemi laminectomy at L5-S1. There is mildly hyperdense soft tissue density within the ventral epidural space posterior to the L5 vertebral body, which may reflect new disc herniation/extrusion. While no frank epidural abscess is identified, possible infection could be considered in the correct clinical setting. No evidence for osteomyelitis discitis within the adjacent L5-S1 disc space. No other evidence for infection about the operative site. No severe canal stenosis appreciated on this examination. Further evaluation with MRI, with and without contrast, would likely be helpful for further evaluation. 2. Mild degenerative disc bulge at L4-5 without stenosis.   Dg Chest Port 1 View 06/05/2015   Findings suggest bronchitis or interstitial pneumonitis. No focal infiltrate or effusion.   Dg Fluoro Guide Lumbar Puncture 06/05/2015  Successful lumbar puncture. The opening pressure could not be obtained. 15.5 cc of CSF were removed and submitted to the laboratory   Medical Consultants:  Neurology over the phone   Other Consultants:  None  IAnti-Infectives:   Vancomycin 01/05 --> Rocephin 01/05 --> Acyclovir 01/05 -->  [name], MD  [facility] Pager [phone]  If 7PM-7AM, please contact night-coverage [email or web address] Password [email or web address] 06/06/2015, 9:54 AM   LOS: 1 day   HPI/Subjective: No events  overnight. Still with nausea but no vomiting since yesterday, headache 3/10 in severity.   Objective: Filed Vitals:   06/05/15 1518 06/05/15 1721 06/05/15 2100 06/06/15 0622  BP: 116/80 138/77 154/86 123/78  Pulse: 70 84 86 78  Temp:  99.3 F (37.4 C) 98.5 F (36.9 C) 97.8 F (36.6 C)  TempSrc:  Oral Oral Oral  Resp: '16 18 20 20  ' Height:  '5\' 7"'  (1.702 m)    Weight:  99.388 kg (219 lb 1.8 oz)    SpO2: 99% 100% 96% 97%    Intake/Output Summary (Last 24 hours) at 06/06/15 0954 Last data filed at 06/06/15 0700  Gross per 24 hour  Intake 1908.5 ml  Output    575 ml  Net 1333.5 ml    Exam:   General:  Pt is alert, follows commands appropriately, not in acute distress  Cardiovascular: Regular rate and rhythm, S1/S2, no murmurs, no rubs, no gallops  Respiratory: Clear to auscultation bilaterally, no wheezing, no crackles, no rhonchi  Abdomen: Soft, non tender, non distended, bowel sounds present, no guarding   Data Reviewed: Basic Metabolic Panel:  Recent Labs Lab 06/04/15 1707 06/04/15 2006 06/05/15 0531 06/06/15 0528  NA 136 137 139 141  K 4.4 3.9 4.2 4.3  CL 98 103 101 105  CO2 '22 22 26 25  ' GLUCOSE 95 109* 109* 158*  BUN 18 17 22* 18  CREATININE 1.11 1.11 1.07 0.89  CALCIUM 9.7 9.1 9.4 8.9   Liver Function Tests:  Recent Labs Lab 06/04/15 1707 06/04/15 2006 06/05/15 0531  AST '17 20 19  ' ALT '27 28 24  ' ALKPHOS 69 60 59  BILITOT 1.7* 1.8* 1.6*  PROT 7.3 6.7 7.5  ALBUMIN 4.4 3.5 4.2    Recent Labs Lab 06/04/15 1707 06/04/15 2006 06/05/15 0531  LIPASE '14 26 16   ' CBC:  Recent Labs Lab 06/04/15 1715 06/04/15 2006 06/05/15 0531 06/06/15 0528  WBC 12.9* 12.8* 10.5 7.2  HGB 13.1* 12.1* 12.2* 11.4*  HCT 38.3* 35.2* 36.2* 33.9*  MCV 85.8 85.9 88.3 86.9  PLT  --  173 165 175   Recent Results (from the past 240 hour(s))  CSF culture     Status: None (Preliminary result)   Collection Time: 06/05/15 10:48 AM  Result Value Ref Range Status    Specimen Description CSF  Final   Special Requests NONE  Final   Gram Stain   Final    CYTOSPIN WBC PRESENT,BOTH PMN AND MONONUCLEAR NO ORGANISMS SEEN Gram Stain Report Called to,Read Back By and Verified With: DR KNAPP AT 1212 ON 1.5.17 BYSHUEA    Culture PENDING  Incomplete   Report Status PENDING  Incomplete    Scheduled Meds: . acyclovir  650 mg Intravenous Q8H  . cefTRIAXone  IV  2 g Intravenous Q12H  . dexamethasone  10 mg Intravenous 4 times per day  . enoxaparin  injection  40 mg Subcutaneous Q24H  . gabapentin  300 mg Oral TID  . vancomycin  1,000 mg Intravenous Q8H   Continuous Infusions: . sodium chloride 75 mL/hr at 06/05/15 1834

## 2015-06-07 DIAGNOSIS — G44021 Chronic cluster headache, intractable: Secondary | ICD-10-CM

## 2015-06-07 LAB — BASIC METABOLIC PANEL
Anion gap: 9 (ref 5–15)
BUN: 26 mg/dL — ABNORMAL HIGH (ref 6–20)
CO2: 26 mmol/L (ref 22–32)
Calcium: 8.3 mg/dL — ABNORMAL LOW (ref 8.9–10.3)
Chloride: 108 mmol/L (ref 101–111)
Creatinine, Ser: 0.91 mg/dL (ref 0.61–1.24)
GFR calc Af Amer: >60 mL/min (ref >60)
GFR calc non Af Amer: >60 mL/min (ref >60)
Glucose, Bld: 108 mg/dL — ABNORMAL HIGH (ref 65–99)
Potassium: 3.7 mmol/L (ref 3.5–5.1)
Sodium: 143 mmol/L (ref 135–145)

## 2015-06-07 LAB — CBC
HCT: 29.7 % — ABNORMAL LOW (ref 39.0–52.0)
Hemoglobin: 10 g/dL — ABNORMAL LOW (ref 13.0–17.0)
MCH: 29.9 pg (ref 26.0–34.0)
MCHC: 33.7 g/dL (ref 30.0–36.0)
MCV: 88.7 fL (ref 78.0–100.0)
Platelets: 149 10*3/uL — ABNORMAL LOW (ref 150–400)
RBC: 3.35 MIL/uL — ABNORMAL LOW (ref 4.22–5.81)
RDW: 12 % (ref 11.5–15.5)
WBC: 7.3 10*3/uL (ref 4.0–10.5)

## 2015-06-07 MED ORDER — HYDROMORPHONE HCL 1 MG/ML IJ SOLN
1.0000 mg | INTRAMUSCULAR | Status: DC | PRN
  Administered 2015-06-07 – 2015-06-09 (×24): 1 mg via INTRAVENOUS
  Filled 2015-06-07 (×24): qty 1

## 2015-06-07 MED ORDER — ZOLPIDEM TARTRATE 5 MG PO TABS
5.0000 mg | ORAL_TABLET | Freq: Once | ORAL | Status: AC
  Administered 2015-06-07: 5 mg via ORAL
  Filled 2015-06-07: qty 1

## 2015-06-07 MED ORDER — GI COCKTAIL ~~LOC~~
30.0000 mL | Freq: Three times a day (TID) | ORAL | Status: DC | PRN
  Administered 2015-06-12: 30 mL via ORAL
  Filled 2015-06-07 (×3): qty 30

## 2015-06-07 NOTE — Progress Notes (Signed)
Patient ID: JAMONE GARRIDO, male   DOB: [birth year], 40 y.o.   MRN: [id number]  [facility] PROGRESS NOTE  GRANT[place] WANT FOY:[id number] DOB: [birth year] DOA: 06/05/2015 PCP: [name], DO   Brief narrative:    Pt is 40 yo male who presented to [facility] ED with main concern of 3 days duration of progressively worsening generalized and diffuse headaches. Pt explains that headaches were initially intermittent but now constant and throbbing, 10/10 in severity, occasionally but not consistently radiating to the neck area, associated with photophobia and phonophobia, subjective fevers and chills, nausea and several episodes of non bloody vomiting, malaise, no specific alleviating factors. Pt denies recent sick contacts or exposures, no similar events in the past. He was seen in ED 1/4 and was given Toradol and phenergan, he got better and was discharged home only to come back with worsening symptoms.   In Ed, pt was in discomfort due to headaches, has wash clot on his head and is unable to oopen his eyes much due to significant photophobia. LP done earlier and preliminary findings on CSF with protein level 249 (elevated), WBC 163, RBC 395. [facility] asked to admit pt for further evaluation. Please note that pt was given one dose of Rocephin 2 g IV while in ED.   Assessment/Plan:    Principal Problem:  Severe headache secondary to likely bacterial meningitis - preliminary CSF analysis discussed with Dr. Janann Colonel neurologist, worrisome for bacterial meningitis - pt started on vancomycin and rocephin, acyclovir, continue day #3 - also started on dexamethasone per protocol for meningitis until pneumococcal meningitis ruled out  - pt clinically improving, provide analgesia as needed - preliminary SCF culture with g+ cocci, final report pending, source likely from spinal surgery post op  Active Problems:  Sepsis ([place]) secondary to bacterial meningitis - pt met criteria for sepsis - ABX as noted  above - follow up on final blood cultures, CSF analysis, HSV - narrow down ABX as clinically indicated     Acute bronchitis - noted on CXR and pt reported some intermittently productive cough - current ABX should be adequate  - pt afebrile this AM and WBC is WNL   DVT prophylaxis - Lovenox SQ  Code Status: Full.  Family Communication:  plan of care discussed with the patient and wife at bedside  Disposition Plan: Home when CSF analysis is back   IV access:  Peripheral IV  Procedures and diagnostic studies:    Ct Head Wo Contrast 06/05/2015  No acute intracranial abnormality noted.   Ct Lumbar Spine W Contrast 05/16/2015 Sequela of interval decompressive right hemi laminectomy at L5-S1. There is mildly hyperdense soft tissue density within the ventral epidural space posterior to the L5 vertebral body, which may reflect new disc herniation/extrusion. While no frank epidural abscess is identified, possible infection could be considered in the correct clinical setting. No evidence for osteomyelitis discitis within the adjacent L5-S1 disc space. No other evidence for infection about the operative site. No severe canal stenosis appreciated on this examination. Further evaluation with MRI, with and without contrast, would likely be helpful for further evaluation. 2. Mild degenerative disc bulge at L4-5 without stenosis.   Dg Chest Port 1 View 06/05/2015   Findings suggest bronchitis or interstitial pneumonitis. No focal infiltrate or effusion.   Dg Fluoro Guide Lumbar Puncture 06/05/2015  Successful lumbar puncture. The opening pressure could not be obtained. 15.5 cc of CSF were removed and submitted to the laboratory   Medical Consultants:  Neurology over the phone   Other Consultants:  None  IAnti-Infectives:   Vancomycin 01/05 --> Rocephin 01/05 --> Acyclovir 01/05 -->  [name], MD  [facility] Pager [phone]  If 7PM-7AM, please contact night-coverage [email or web address] Password  [email or web address] 06/07/2015, 2:04 PM   LOS: 2 days   HPI/Subjective: No events overnight. Still with nausea but no vomiting since admission, headache 5/10 in severity.   Objective: Filed Vitals:   06/06/15 1429 06/06/15 2116 06/07/15 0630 06/07/15 1345  BP: 104/62 122/66 102/58 120/79  Pulse: 81 78 69 69  Temp: 97.7 F (36.5 C) 98.3 F (36.8 C) 98.2 F (36.8 C) 97.5 F (36.4 C)  TempSrc: Oral Oral Oral Oral  Resp: '16 18 20 20  ' Height:      Weight:      SpO2: 98% 98% 97% 99%    Intake/Output Summary (Last 24 hours) at 06/07/15 1404 Last data filed at 06/06/15 2300  Gross per 24 hour  Intake   1343 ml  Output    100 ml  Net   1243 ml    Exam:   General:  Pt is alert, follows commands appropriately, not in acute distress  Cardiovascular: Regular rate and rhythm, S1/S2, no murmurs, no rubs, no gallops  Respiratory: Clear to auscultation bilaterally, no wheezing, no crackles, no rhonchi  Abdomen: Soft, non tender, non distended, bowel sounds present, no guarding   Data Reviewed: Basic Metabolic Panel:  Recent Labs Lab 06/04/15 1707 06/04/15 2006 06/05/15 0531 06/06/15 0528 06/07/15 0622  NA 136 137 139 141 143  K 4.4 3.9 4.2 4.3 3.7  CL 98 103 101 105 108  CO2 '22 22 26 25 26  ' GLUCOSE 95 109* 109* 158* 108*  BUN 18 17 22* 18 26*  CREATININE 1.11 1.11 1.07 0.89 0.91  CALCIUM 9.7 9.1 9.4 8.9 8.3*   Liver Function Tests:  Recent Labs Lab 06/04/15 1707 06/04/15 2006 06/05/15 0531  AST '17 20 19  ' ALT '27 28 24  ' ALKPHOS 69 60 59  BILITOT 1.7* 1.8* 1.6*  PROT 7.3 6.7 7.5  ALBUMIN 4.4 3.5 4.2    Recent Labs Lab 06/04/15 1707 06/04/15 2006 06/05/15 0531  LIPASE '14 26 16   ' CBC:  Recent Labs Lab 06/04/15 1715 06/04/15 2006 06/05/15 0531 06/06/15 0528 06/07/15 0622  WBC 12.9* 12.8* 10.5 7.2 7.3  HGB 13.1* 12.1* 12.2* 11.4* 10.0*  HCT 38.3* 35.2* 36.2* 33.9* 29.7*  MCV 85.8 85.9 88.3 86.9 88.7  PLT  --  173 165 175 149*   Recent Results (from the past  240 hour(s))  CSF culture     Status: None (Preliminary result)   Collection Time: 06/05/15 10:48 AM  Result Value Ref Range Status   Specimen Description CSF  Final   Special Requests NONE  Final   Gram Stain   Final    CYTOSPIN WBC PRESENT,BOTH PMN AND MONONUCLEAR NO ORGANISMS SEEN Gram Stain Report Called to,Read Back By and Verified With: DR KNAPP AT 1212 ON 1.5.17 BYSHUEA    Culture   Final    RARE GRAM POSITIVE COCCI CRITICAL RESULT CALLED TO, READ BACK BY AND VERIFIED WITH: DR Doyle Askew 06/07/15 @ 5631 M VESTAL Performed at [facility]    Report Status PENDING  Incomplete  Culture, blood (routine x 2)     Status: None (Preliminary result)   Collection Time: 06/05/15  8:10 PM  Result Value Ref Range Status   Specimen Description BLOOD LEFT HAND  Final   Special Requests  BOTTLES DRAWN AEROBIC ONLY 5CC  Final   Culture   Final    NO GROWTH 2 DAYS Performed at [facility]    Report Status PENDING  Incomplete  Culture, blood (routine x 2)     Status: None (Preliminary result)   Collection Time: 06/05/15  8:15 PM  Result Value Ref Range Status   Specimen Description BLOOD LEFT HAND  Final   Special Requests IN PEDIATRIC BOTTLE 3CC  Final   Culture   Final    NO GROWTH 2 DAYS Performed at [facility]    Report Status PENDING  Incomplete    Scheduled Meds: . acyclovir  650 mg Intravenous Q8H  . cefTRIAXone  IV  2 g Intravenous Q12H  . dexamethasone  10 mg Intravenous 4 times per day  . enoxaparin  injection  40 mg Subcutaneous Q24H  . gabapentin  300 mg Oral TID  . vancomycin  1,000 mg Intravenous Q8H   Continuous Infusions: . sodium chloride 75 mL/hr at 06/05/15 1834

## 2015-06-08 LAB — CBC
HCT: 31.9 % — ABNORMAL LOW (ref 39.0–52.0)
Hemoglobin: 10.8 g/dL — ABNORMAL LOW (ref 13.0–17.0)
MCH: 29.5 pg (ref 26.0–34.0)
MCHC: 33.9 g/dL (ref 30.0–36.0)
MCV: 87.2 fL (ref 78.0–100.0)
Platelets: 189 10*3/uL (ref 150–400)
RBC: 3.66 MIL/uL — ABNORMAL LOW (ref 4.22–5.81)
RDW: 11.7 % (ref 11.5–15.5)
WBC: 8.2 10*3/uL (ref 4.0–10.5)

## 2015-06-08 LAB — BASIC METABOLIC PANEL
Anion gap: 9 (ref 5–15)
BUN: 13 mg/dL (ref 6–20)
CO2: 28 mmol/L (ref 22–32)
Calcium: 9.1 mg/dL (ref 8.9–10.3)
Chloride: 104 mmol/L (ref 101–111)
Creatinine, Ser: 0.79 mg/dL (ref 0.61–1.24)
GFR calc Af Amer: >60 mL/min (ref >60)
GFR calc non Af Amer: >60 mL/min (ref >60)
Glucose, Bld: 145 mg/dL — ABNORMAL HIGH (ref 65–99)
Potassium: 4.1 mmol/L (ref 3.5–5.1)
Sodium: 141 mmol/L (ref 135–145)

## 2015-06-08 LAB — VANCOMYCIN, TROUGH: Vancomycin Tr: 12 ug/mL (ref 10.0–20.0)

## 2015-06-08 MED ORDER — VANCOMYCIN HCL 10 G IV SOLR
1250.0000 mg | Freq: Three times a day (TID) | INTRAVENOUS | Status: DC
  Administered 2015-06-08 – 2015-06-11 (×8): 1250 mg via INTRAVENOUS
  Filled 2015-06-08 (×11): qty 1250

## 2015-06-08 MED ORDER — DEXAMETHASONE SODIUM PHOSPHATE 10 MG/ML IJ SOLN
10.0000 mg | INTRAMUSCULAR | Status: DC
  Administered 2015-06-09: 10 mg via INTRAVENOUS
  Filled 2015-06-08: qty 1

## 2015-06-08 MED ORDER — ZOLPIDEM TARTRATE 5 MG PO TABS
5.0000 mg | ORAL_TABLET | Freq: Every evening | ORAL | Status: DC | PRN
  Administered 2015-06-08: 5 mg via ORAL
  Filled 2015-06-08: qty 1

## 2015-06-08 MED ORDER — VANCOMYCIN HCL IN DEXTROSE 1-5 GM/200ML-% IV SOLN
1000.0000 mg | Freq: Three times a day (TID) | INTRAVENOUS | Status: DC
  Filled 2015-06-08 (×2): qty 200

## 2015-06-08 NOTE — Progress Notes (Signed)
Pharmacy Antibiotic Follow-up Note  Bruce Morales is a 40 y.o. year-old male admitted on 06/05/2015.  The patient is currently on day 4 of Rocephin 2gm IV q12h + Vancomycin1gm IV q8h for meningitis.  Assessment/Plan: No change Rocephin, Acyclovir discontinued Increase Vancomycin to 1250mg  q8hr and recheck level at steady state Monitor renal function and cx data   Temp (24hrs), Avg:98 F (36.7 C), Min:97.7 F (36.5 C), Max:98.6 F (37 C)   Recent Labs Lab 06/04/15 2006 06/05/15 0531 06/06/15 0528 06/07/15 0622 06/08/15 0545  WBC 12.8* 10.5 7.2 7.3 8.2     Recent Labs Lab 06/04/15 2006 06/05/15 0531 06/06/15 0528 06/07/15 0622 06/08/15 0545  CREATININE 1.11 1.07 0.89 0.91 0.79   Estimated Creatinine Clearance: 143.6 mL/min (by C-G formula based on Cr of 0.79).    Allergies  Allergen Reactions  . Hydrocodone Hives and Itching  . Oxycodone     Mild Itching, patient can tolerate oxycodone  . Percocet [Oxycodone-Acetaminophen]     Itching    Antimicrobials this admission: 1/5 >> Ceftriaxone >> 1/5 >> Vancomycin >> 1/5 >> Acyclovir >> 1/8  Levels/dose changes this admission: 1/6 at 1830 = 20 mcg/ml on Vanc 1gm IV q8h- no change 1/8 at 18:45 = 12 mcg/ml on Vanc 1gm q8  Microbiology results: 1/5 BCx: NGTD 1/5 CSF culture: CoNS  Thank you for allowing pharmacy to be a part of this patient's care.  Otho BellowsGreen, Emmanuel Gruenhagen L PharmD 06/08/2015 7:55 PM

## 2015-06-08 NOTE — Progress Notes (Signed)
Patient ID: Bruce Morales, male   DOB: Oct 01, 1975, 40 y.o.   MRN: [id number]  [facility] PROGRESS NOTE  Bruce Morales:[id number] DOB: [birth year] DOA: 06/05/2015 PCP: [name], DO   Brief narrative:    Pt is 40 yo male who presented to [facility] ED with main concern of 3 days duration of progressively worsening generalized and diffuse headaches. Pt explains that headaches were initially intermittent but now constant and throbbing, 10/10 in severity, occasionally but not consistently radiating to the neck area, associated with photophobia and phonophobia, subjective fevers and chills, nausea and several episodes of non bloody vomiting, malaise, no specific alleviating factors. Pt denies recent sick contacts or exposures, no similar events in the past. He was seen in ED 1/4 and was given Toradol and phenergan, he got better and was discharged home only to come back with worsening symptoms.   In Ed, pt was in discomfort due to headaches, has wash clot on his head and is unable to oopen his eyes much due to significant photophobia. LP done earlier and preliminary findings on CSF with protein level 249 (elevated), WBC 163, RBC 395. [facility] asked to admit pt for further evaluation. Please note that pt was given one dose of Rocephin 2 g IV while in ED.   Assessment/Plan:    Principal Problem:  Severe headache secondary to likely bacterial meningitis - preliminary CSF analysis discussed with Dr. Janann Colonel neurologist, worrisome for bacterial meningitis - pt started on vancomycin and rocephin, acyclovir, continue day #4 - also started on dexamethasone per protocol for meningitis until pneumococcal meningitis ruled out  - pt clinically improving, provide analgesia as needed - preliminary SCF culture with g+ cocci, final report pending, source likely from spinal surgery post op  Active Problems:  Sepsis ([place]) secondary to bacterial meningitis - pt met criteria for sepsis - ABX as noted  above - follow up on final blood cultures, CSF analysis, HSV - narrow down ABX as clinically indicated     Acute bronchitis - noted on CXR and pt reported some intermittently productive cough - current ABX should be adequate  - pt afebrile this AM and WBC is WNL   DVT prophylaxis - Lovenox SQ  Code Status: Full.  Family Communication:  plan of care discussed with the patient and wife at bedside  Disposition Plan: Home when CSF analysis is back   IV access:  Peripheral IV  Procedures and diagnostic studies:    Ct Head Wo Contrast 06/05/2015  No acute intracranial abnormality noted.   Ct Lumbar Spine W Contrast 05/16/2015 Sequela of interval decompressive right hemi laminectomy at L5-S1. There is mildly hyperdense soft tissue density within the ventral epidural space posterior to the L5 vertebral body, which may reflect new disc herniation/extrusion. While no frank epidural abscess is identified, possible infection could be considered in the correct clinical setting. No evidence for osteomyelitis discitis within the adjacent L5-S1 disc space. No other evidence for infection about the operative site. No severe canal stenosis appreciated on this examination. Further evaluation with MRI, with and without contrast, would likely be helpful for further evaluation. 2. Mild degenerative disc bulge at L4-5 without stenosis.   Dg Chest Port 1 View 06/05/2015   Findings suggest bronchitis or interstitial pneumonitis. No focal infiltrate or effusion.   Dg Fluoro Guide Lumbar Puncture 06/05/2015  Successful lumbar puncture. The opening pressure could not be obtained. 15.5 cc of CSF were removed and submitted to the laboratory   Medical Consultants:  Neurology over the phone   Other Consultants:  None  IAnti-Infectives:   Vancomycin 01/05 --> Rocephin 01/05 --> Acyclovir 01/05 -->  [name], MD  [facility] Pager [phone]  If 7PM-7AM, please contact night-coverage [email or web address] Password  [email or web address] 06/08/2015, 11:00 AM   LOS: 3 days   HPI/Subjective: No events overnight. Still with nausea but no vomiting since admission, headache 2/10 in severity.   Objective: Filed Vitals:   06/07/15 0630 06/07/15 1345 06/07/15 2126 06/08/15 0602  BP: 102/58 120/79 140/82 110/65  Pulse: 69 69 73 63  Temp: 98.2 F (36.8 C) 97.5 F (36.4 C) 98.6 F (37 C) 97.7 F (36.5 C)  TempSrc: Oral Oral Oral Oral  Resp: '20 20 20 20  ' Height:      Weight:    101.651 kg (224 lb 1.6 oz)  SpO2: 97% 99% 95% 97%    Intake/Output Summary (Last 24 hours) at 06/08/15 1100 Last data filed at 06/08/15 1057  Gross per 24 hour  Intake   1850 ml  Output   2350 ml  Net   -500 ml    Exam:   General:  Pt is alert, follows commands appropriately, not in acute distress  Cardiovascular: Regular rate and rhythm, S1/S2, no murmurs, no rubs, no gallops  Respiratory: Clear to auscultation bilaterally, no wheezing, no crackles, no rhonchi  Abdomen: Soft, non tender, non distended, bowel sounds present, no guarding   Data Reviewed: Basic Metabolic Panel:  Recent Labs Lab 06/04/15 2006 06/05/15 0531 06/06/15 0528 06/07/15 0622 06/08/15 0545  NA 137 139 141 143 141  K 3.9 4.2 4.3 3.7 4.1  CL 103 101 105 108 104  CO2 '22 26 25 26 28  ' GLUCOSE 109* 109* 158* 108* 145*  BUN 17 22* 18 26* 13  CREATININE 1.11 1.07 0.89 0.91 0.79  CALCIUM 9.1 9.4 8.9 8.3* 9.1   Liver Function Tests:  Recent Labs Lab 06/04/15 1707 06/04/15 2006 06/05/15 0531  AST '17 20 19  ' ALT '27 28 24  ' ALKPHOS 69 60 59  BILITOT 1.7* 1.8* 1.6*  PROT 7.3 6.7 7.5  ALBUMIN 4.4 3.5 4.2    Recent Labs Lab 06/04/15 1707 06/04/15 2006 06/05/15 0531  LIPASE '14 26 16   ' CBC:  Recent Labs Lab 06/04/15 2006 06/05/15 0531 06/06/15 0528 06/07/15 0622 06/08/15 0545  WBC 12.8* 10.5 7.2 7.3 8.2  HGB 12.1* 12.2* 11.4* 10.0* 10.8*  HCT 35.2* 36.2* 33.9* 29.7* 31.9*  MCV 85.9 88.3 86.9 88.7 87.2  PLT 173 165 175 149* 189    Recent Results (from the past 240 hour(s))  CSF culture     Status: None (Preliminary result)   Collection Time: 06/05/15 10:48 AM  Result Value Ref Range Status   Specimen Description CSF  Final   Special Requests NONE  Final   Gram Stain   Final    CYTOSPIN WBC PRESENT,BOTH PMN AND MONONUCLEAR NO ORGANISMS SEEN Gram Stain Report Called to,Read Back By and Verified With: DR KNAPP AT 1212 ON 1.5.17 BYSHUEA    Culture   Final    RARE STAPHYLOCOCCUS SPECIES (COAGULASE NEGATIVE) CRITICAL RESULT CALLED TO, READ BACK BY AND VERIFIED WITH: DR Doyle Askew 06/07/15 @ 0847 M VESTAL SUSCEPTIBILITIES TO FOLLOW Performed at [facility]    Report Status PENDING  Incomplete  Culture, blood (routine x 2)     Status: None (Preliminary result)   Collection Time: 06/05/15  8:10 PM  Result Value Ref Range Status   Specimen Description BLOOD LEFT HAND  Final   Special Requests BOTTLES DRAWN AEROBIC ONLY 5CC  Final   Culture   Final    NO GROWTH 2 DAYS Performed at [facility]    Report Status PENDING  Incomplete  Culture, blood (routine x 2)     Status: None (Preliminary result)   Collection Time: 06/05/15  8:15 PM  Result Value Ref Range Status   Specimen Description BLOOD LEFT HAND  Final   Special Requests IN PEDIATRIC BOTTLE 3CC  Final   Culture   Final    NO GROWTH 2 DAYS Performed at [facility]    Report Status PENDING  Incomplete    Scheduled Meds: . acyclovir  650 mg Intravenous Q8H  . cefTRIAXone  IV  2 g Intravenous Q12H  . dexamethasone  10 mg Intravenous 4 times per day  . enoxaparin  injection  40 mg Subcutaneous Q24H  . gabapentin  300 mg Oral TID  . vancomycin  1,000 mg Intravenous Q8H   Continuous Infusions: . sodium chloride 75 mL/hr at 06/08/15 0310

## 2015-06-09 ENCOUNTER — Telehealth: Payer: Self-pay

## 2015-06-09 LAB — CSF CULTURE

## 2015-06-09 MED ORDER — HYDROMORPHONE HCL 1 MG/ML IJ SOLN
1.0000 mg | INTRAMUSCULAR | Status: DC | PRN
  Administered 2015-06-09: 2 mg via INTRAVENOUS
  Administered 2015-06-10 – 2015-06-12 (×4): 1 mg via INTRAVENOUS
  Filled 2015-06-09: qty 2
  Filled 2015-06-09 (×4): qty 1

## 2015-06-09 MED ORDER — NALOXONE HCL 0.4 MG/ML IJ SOLN: 0.4000 mg | INTRAMUSCULAR | Status: DC | PRN

## 2015-06-09 MED ORDER — DIPHENHYDRAMINE HCL 12.5 MG/5ML PO ELIX: 12.5000 mg | ORAL_SOLUTION | Freq: Four times a day (QID) | ORAL | Status: DC | PRN

## 2015-06-09 MED ORDER — SODIUM CHLORIDE 0.9 % IJ SOLN: 9.0000 mL | INTRAMUSCULAR | Status: DC | PRN

## 2015-06-09 MED ORDER — ZOLPIDEM TARTRATE 5 MG PO TABS: 10.0000 mg | ORAL_TABLET | Freq: Every evening | ORAL | Status: DC | PRN

## 2015-06-09 MED ORDER — ONDANSETRON HCL 4 MG/2ML IJ SOLN: 4.0000 mg | Freq: Four times a day (QID) | INTRAMUSCULAR | Status: DC | PRN

## 2015-06-09 MED ORDER — DIPHENHYDRAMINE HCL 50 MG/ML IJ SOLN: 12.5000 mg | Freq: Four times a day (QID) | INTRAMUSCULAR | Status: DC | PRN

## 2015-06-09 MED ORDER — HYDROMORPHONE 1 MG/ML IV SOLN
INTRAVENOUS | Status: DC
  Administered 2015-06-09: 20:00:00 via INTRAVENOUS
  Administered 2015-06-09: 3.4 mg via INTRAVENOUS
  Administered 2015-06-09 – 2015-06-12 (×3): via INTRAVENOUS
  Administered 2015-06-12: 2.7 mg via INTRAVENOUS
  Administered 2015-06-12: 4.8 mg via INTRAVENOUS
  Filled 2015-06-09 (×4): qty 25

## 2015-06-09 MED ORDER — KETOROLAC TROMETHAMINE 30 MG/ML IJ SOLN
30.0000 mg | Freq: Four times a day (QID) | INTRAMUSCULAR | Status: AC | PRN
  Administered 2015-06-10 – 2015-06-14 (×11): 30 mg via INTRAVENOUS
  Filled 2015-06-09 (×11): qty 1

## 2015-06-09 NOTE — Telephone Encounter (Signed)
Patient requesting Dr. Conley RollsLe to call him back regarding meningitis diagnosis.  Post hospital stay.   [phone]

## 2015-06-09 NOTE — Care Management Note (Signed)
Case Management Note  Patient Details  Name: Bruce Morales MRN: [id number] Date of Birth: 1975/06/10  Subjective/Objective: neurosx following-mri @ [facility]. Iv abx, iv dilaudid pca, ivf. If long term iv abx-can arrange w/orders.From home.                   Action/Plan:d/c plan home.   Expected Discharge Date:   (UNKNOWN)               Expected Discharge Plan:  Home/Self Care  In-House Referral:     Discharge planning Services  CM Consult  Post Acute Care Choice:    Choice offered to:     DME Arranged:    DME Agency:     HH Arranged:    HH Agency:     Status of Service:  In process, will continue to follow  Medicare Important Message Given:    Date Medicare IM Given:    Medicare IM give by:    Date Additional Medicare IM Given:    Additional Medicare Important Message give by:     If discussed at Long Length of Stay Meetings, dates discussed:    Additional Comments:  Lanier ClamMahabir, Jermey Closs, RN 06/09/2015, 4:51 PM

## 2015-06-09 NOTE — Progress Notes (Signed)
Patient ID: Bruce Morales, male   DOB: 1976-05-26, 40 y.o.   MRN: [id number] This patient is well-known to me.  He is status post microdiscectomy at L5-S1 on the right 2 months ago.  He has had considerable pain since the time of surgery.  He has been to the emergency department had a CT scan done.  He also had an MRI done in my office.  This showed expected postoperative changes without recurrent disc herniation or significant signs of infection.  He did have slightly elevated sedimentation rate and CRP but not to the levels we typically see with postoperative wound infection or discitis.  It is been difficult to control his pain.  I have reviewed his chart.headache started abruptly just a few days ago.  This was associated with nausea and vomiting.  He has not had drainage from his wound.  He is awake and alert complaining of severe back pain and headache.  He is nonfocal neurologically.  His wound is pristine and flat without redness or swelling or induration and appears to be completely healed, and while he complains of tenderness he really complains of tenderness all around when you touch him and he has some bruising from the LP.  He has good strength in the lower extremities.  Given his positive CSF cultures with staph epidermidis:  Do not know the etiology of the infection but given his recent surgery I would have to assume that the 2 are related, or at least that would seem most likely. It is possible he could have an unintended durotomy anda deep wound infection that allowed bacteria to enter the CSF.  He could have a paraspinous infection or discitis that then led to meningitis.  A MRI could be suggestive but not diagnostic.  Sedimentation rate and CRP would be nondiagnosticand have a little specificity. he is on antibiotics and therefore disc aspiration would be nondiagnostic most likely.therefore I think we have to presumedate he may have a paraspinous infection or postoperative discitis and  therefore treat this this way as the safest to measure  I feel it is safest to treat this like we would a deep wound infection or discitiswith 6 weeks of IV antibiotics followed by 6 weeks of oral antibiotics. This would also cover his meningitis.  I have ordered blood cultures that will likely be negative because of his IV antibiotics but once they are negative then he can have a PICC line placed and be discharged to home with home IV therapies.  I have started a Dilaudid PCA for pain control.  If he does have a deep wound infection or discitis then the IV antibiotics may improve his pain over time as the infection is treated.  He has always had pain out of proportion to the findings on the imaging even before his surgery and certainly after his surgery, and it has always been very difficult to control his pain both preoperatively and postoperatively.  I have spoken to he and his wife at length.  [facility] be [facility] where they can do an MRI of the lumbar spine with and without contrast under sedation as he has refused a closed MRI.

## 2015-06-09 NOTE — Progress Notes (Signed)
Pt arrived to [facility] via [facility]link.  Pt c/o pain 10/10, no orders for pain medication.  MD paged for new orders, awaiting return call.  Will continue to monitor. Sondra ComeSilva, Meghen Akopyan M, RN

## 2015-06-09 NOTE — Progress Notes (Addendum)
Patient ID: Bruce Morales, male   DOB: 09-14-1975, 40 y.o.   MRN: [id number]  [facility] PROGRESS NOTE  Bruce Morales:[id number] DOB: [birth year] DOA: 06/05/2015 PCP: [name], DO   Brief narrative:    Pt is 40 yo male who presented to [facility] ED with main concern of 3 days duration of progressively worsening generalized and diffuse headaches. Pt explains that headaches were initially intermittent but now constant and throbbing, 10/10 in severity, occasionally but not consistently radiating to the neck area, associated with photophobia and phonophobia, subjective fevers and chills, nausea and several episodes of non bloody vomiting, malaise, no specific alleviating factors. Pt denies recent sick contacts or exposures, no similar events in the past. He was seen in ED 1/4 and was given Toradol and phenergan, he got better and was discharged home only to come back with worsening symptoms.   In Ed, pt was in discomfort due to headaches, has wash clot on his head and is unable to oopen his eyes much due to significant photophobia. LP done earlier and preliminary findings on CSF with protein level 249 (elevated), WBC 163, RBC 395. [facility] asked to admit pt for further evaluation. Please note that pt was given one dose of Rocephin 2 g IV while in ED.   Assessment/Plan:    Principal Problem:  Severe headache secondary to likely bacterial meningitis - preliminary CSF analysis discussed with Dr. Janann Colonel neurologist, worrisome for bacterial meningitis - pt started on vancomycin and rocephin, acyclovir, continued for 4 days - taper off dexamethasone as strep ruled out, stopped acyclovir 06/08/2015 - pt clinically improving, provide analgesia as needed - CSF cultures with staph, pan sensitive, d/w Dr. Ronnald Ramp, keep on broad spectrum until MRI lumbar spine done   Active Problems:  Sepsis ([place]) secondary to bacterial meningitis - pt met criteria for sepsis, now resolved  - ABX as noted  above    Thrombocytopenia - reactive, resolved     Acute bronchitis - noted on CXR and pt reported some intermittently productive cough - current ABX should be adequate  - pt afebrile this AM and WBC is WNL   DVT prophylaxis - Lovenox SQ  Code Status: Full.  Family Communication:  plan of care discussed with the patient and wife at bedside  Disposition Plan: Home when cleared by neurosurgery   IV access:  Peripheral IV  Procedures and diagnostic studies:    Ct Head Wo Contrast 06/05/2015  No acute intracranial abnormality noted.   Ct Lumbar Spine W Contrast 05/16/2015 Sequela of interval decompressive right hemi laminectomy at L5-S1. There is mildly hyperdense soft tissue density within the ventral epidural space posterior to the L5 vertebral body, which may reflect new disc herniation/extrusion. While no frank epidural abscess is identified, possible infection could be considered in the correct clinical setting. No evidence for osteomyelitis discitis within the adjacent L5-S1 disc space. No other evidence for infection about the operative site. No severe canal stenosis appreciated on this examination. Further evaluation with MRI, with and without contrast, would likely be helpful for further evaluation. 2. Mild degenerative disc bulge at L4-5 without stenosis.   Dg Chest Port 1 View 06/05/2015   Findings suggest bronchitis or interstitial pneumonitis. No focal infiltrate or effusion.   Dg Fluoro Guide Lumbar Puncture 06/05/2015  Successful lumbar puncture. The opening pressure could not be obtained. 15.5 cc of CSF were removed and submitted to the laboratory   Medical Consultants:  Neurology over the phone   Other Consultants:  None  IAnti-Infectives:   Vancomycin 01/05 --> Rocephin 01/05 -->  Acyclovir 01/05 --> 06/08/2015  Bruce Ramsay, MD  [facility] Pager [phone]  If 7PM-7AM, please contact night-coverage [email or web address] Password [email or web address] 06/09/2015, 11:22 AM   LOS: 4 days    HPI/Subjective: No events overnight. Still with nausea but no vomiting since admission, headache 2/10 in severity.   Objective: Filed Vitals:   06/08/15 0602 06/08/15 1332 06/08/15 2000 06/09/15 0508  BP: 110/65 123/71 115/66 127/82  Pulse: 63 80 73 58  Temp: 97.7 F (36.5 C) 97.7 F (36.5 C) 98.2 F (36.8 C) 97.8 F (36.6 C)  TempSrc: Oral Oral Oral Oral  Resp: '20 20 20 20  ' Height:      Weight: 101.651 kg (224 lb 1.6 oz)   105.189 kg (231 lb 14.4 oz)  SpO2: 97% 99% 97% 98%    Intake/Output Summary (Last 24 hours) at 06/09/15 1122 Last data filed at 06/09/15 0845  Gross per 24 hour  Intake   3840 ml  Output    905 ml  Net   2935 ml    Exam:   General:  Pt is alert, follows commands appropriately, not in acute distress  Cardiovascular: Regular rate and rhythm, S1/S2, no murmurs, no rubs, no gallops  Respiratory: Clear to auscultation bilaterally, no wheezing, no crackles, no rhonchi  Abdomen: Soft, non tender, non distended, bowel sounds present, no guarding   Data Reviewed: Basic Metabolic Panel:  Recent Labs Lab 06/04/15 2006 06/05/15 0531 06/06/15 0528 06/07/15 0622 06/08/15 0545  NA 137 139 141 143 141  K 3.9 4.2 4.3 3.7 4.1  CL 103 101 105 108 104  CO2 '22 26 25 26 28  ' GLUCOSE 109* 109* 158* 108* 145*  BUN 17 22* 18 26* 13  CREATININE 1.11 1.07 0.89 0.91 0.79  CALCIUM 9.1 9.4 8.9 8.3* 9.1   Liver Function Tests:  Recent Labs Lab 06/04/15 1707 06/04/15 2006 06/05/15 0531  AST '17 20 19  ' ALT '27 28 24  ' ALKPHOS 69 60 59  BILITOT 1.7* 1.8* 1.6*  PROT 7.3 6.7 7.5  ALBUMIN 4.4 3.5 4.2    Recent Labs Lab 06/04/15 1707 06/04/15 2006 06/05/15 0531  LIPASE '14 26 16   ' CBC:  Recent Labs Lab 06/04/15 2006 06/05/15 0531 06/06/15 0528 06/07/15 0622 06/08/15 0545  WBC 12.8* 10.5 7.2 7.3 8.2  HGB 12.1* 12.2* 11.4* 10.0* 10.8*  HCT 35.2* 36.2* 33.9* 29.7* 31.9*  MCV 85.9 88.3 86.9 88.7 87.2  PLT 173 165 175 149* 189   Recent Results  (from the past 240 hour(s))  CSF culture     Status: None   Collection Time: 06/05/15 10:48 AM  Result Value Ref Range Status   Specimen Description CSF  Final   Special Requests NONE  Final   Gram Stain   Final    CYTOSPIN WBC PRESENT,BOTH PMN AND MONONUCLEAR NO ORGANISMS SEEN Gram Stain Report Called to,Read Back By and Verified With: DR KNAPP AT 1212 ON 1.5.17 BYSHUEA    Culture   Final    RARE STAPHYLOCOCCUS SPECIES (COAGULASE NEGATIVE) CRITICAL RESULT CALLED TO, READ BACK BY AND VERIFIED WITH: DR Doyle Askew 06/07/15 @ 8875 M VESTAL Performed at [facility]    Report Status 06/09/2015 FINAL  Final   Organism ID, Bacteria STAPHYLOCOCCUS SPECIES (COAGULASE NEGATIVE)  Final      Susceptibility   Staphylococcus species (coagulase negative) - MIC*    CIPROFLOXACIN <=0.5 SENSITIVE Sensitive     ERYTHROMYCIN >=8 RESISTANT Resistant  GENTAMICIN <=0.5 SENSITIVE Sensitive     OXACILLIN >=4 RESISTANT Resistant     TETRACYCLINE <=1 SENSITIVE Sensitive     VANCOMYCIN 2 SENSITIVE Sensitive     TRIMETH/SULFA <=10 SENSITIVE Sensitive     CLINDAMYCIN <=0.25 SENSITIVE Sensitive     RIFAMPIN <=0.5 SENSITIVE Sensitive     Inducible Clindamycin NEGATIVE Sensitive     * RARE STAPHYLOCOCCUS SPECIES (COAGULASE NEGATIVE)  Culture, blood (routine x 2)     Status: None (Preliminary result)   Collection Time: 06/05/15  8:10 PM  Result Value Ref Range Status   Specimen Description BLOOD LEFT HAND  Final   Special Requests BOTTLES DRAWN AEROBIC ONLY 5CC  Final   Culture   Final    NO GROWTH 3 DAYS Performed at [facility]    Report Status PENDING  Incomplete  Culture, blood (routine x 2)     Status: None (Preliminary result)   Collection Time: 06/05/15  8:15 PM  Result Value Ref Range Status   Specimen Description BLOOD LEFT HAND  Final   Special Requests IN PEDIATRIC BOTTLE 3CC  Final   Culture   Final    NO GROWTH 3 DAYS Performed at [facility]    Report Status PENDING   Incomplete    Scheduled Meds: . acyclovir  650 mg Intravenous Q8H  . cefTRIAXone  IV  2 g Intravenous Q12H  . dexamethasone  10 mg Intravenous 4 times per day  . enoxaparin  injection  40 mg Subcutaneous Q24H  . gabapentin  300 mg Oral TID  . vancomycin  1,000 mg Intravenous Q8H   Continuous Infusions: . sodium chloride 75 mL/hr at 06/08/15 2032

## 2015-06-09 NOTE — Telephone Encounter (Signed)
Patient called back, he stated Dr. Conley RollsLe called him yesterday with lab results which indicated meningitis. Reviewed labs from [facility] 06/04/15 and Dr. Conley RollsLe did not do any testing for this. Explained that to patient, voiced understanding. He said it might have been one of the other doctors from hospital.

## 2015-06-09 NOTE — Progress Notes (Signed)
Pt in extreme pain.  MD paged.  New orders received, awaiting pharmacy verification.  Will continue to monitor. Sondra ComeSilva, Lenora Gomes M, RN

## 2015-06-09 NOTE — Progress Notes (Signed)
Pt will be [facility] for [facility] and for Neurology to follow. [facility] will be transported via [facility].

## 2015-06-09 NOTE — Telephone Encounter (Signed)
I did call him to check up on him.

## 2015-06-10 ENCOUNTER — Ambulatory Visit (HOSPITAL_COMMUNITY): Admit: 2015-06-10 | Payer: 59

## 2015-06-10 ENCOUNTER — Ambulatory Visit (HOSPITAL_COMMUNITY)
Admission: RE | Admit: 2015-06-10 | Payer: Worker's Compensation | Source: Ambulatory Visit | Admitting: Internal Medicine

## 2015-06-10 DIAGNOSIS — B957 Other staphylococcus as the cause of diseases classified elsewhere: Secondary | ICD-10-CM

## 2015-06-10 DIAGNOSIS — T8189XD Other complications of procedures, not elsewhere classified, subsequent encounter: Secondary | ICD-10-CM

## 2015-06-10 DIAGNOSIS — Z9889 Other specified postprocedural states: Secondary | ICD-10-CM

## 2015-06-10 DIAGNOSIS — G003 Staphylococcal meningitis: Secondary | ICD-10-CM

## 2015-06-10 LAB — CULTURE, BLOOD (ROUTINE X 2)
Culture: NO GROWTH
Culture: NO GROWTH

## 2015-06-10 LAB — VANCOMYCIN, TROUGH: Vancomycin Tr: 18 ug/mL (ref 10.0–20.0)

## 2015-06-10 MED ORDER — SENNOSIDES-DOCUSATE SODIUM 8.6-50 MG PO TABS
2.0000 | ORAL_TABLET | Freq: Two times a day (BID) | ORAL | Status: DC
  Administered 2015-06-10 – 2015-06-15 (×10): 2 via ORAL
  Filled 2015-06-10 (×11): qty 2

## 2015-06-10 MED ORDER — SODIUM CHLORIDE 0.9 % IJ SOLN
10.0000 mL | INTRAMUSCULAR | Status: DC | PRN
  Administered 2015-06-11 – 2015-06-15 (×3): 10 mL
  Filled 2015-06-10 (×3): qty 40

## 2015-06-10 MED ORDER — POLYETHYLENE GLYCOL 3350 17 G PO PACK
17.0000 g | PACK | Freq: Two times a day (BID) | ORAL | Status: DC
  Administered 2015-06-12 – 2015-06-15 (×7): 17 g via ORAL
  Filled 2015-06-10 (×10): qty 1

## 2015-06-10 NOTE — Anesthesia Preprocedure Evaluation (Addendum)
Anesthesia Evaluation  Patient identified by MRN, date of birth, ID band Patient awake    Reviewed: Allergy & Precautions, NPO status , Patient's Chart, lab work & pertinent test results  Airway Mallampati: II  TM Distance: >3 FB Neck ROM: Full    Dental no notable dental hx.    Pulmonary neg pulmonary ROS, shortness of breath, former smoker,    Pulmonary exam normal breath sounds clear to auscultation       Cardiovascular + angina negative cardio ROS Normal cardiovascular exam Rhythm:Regular Rate:Normal     Neuro/Psych  Headaches,  Neuromuscular disease negative neurological ROS  negative psych ROS   GI/Hepatic negative GI ROS, Neg liver ROS,   Endo/Other  Morbid obesity  Renal/GU negative Renal ROS  negative genitourinary   Musculoskeletal negative musculoskeletal ROS (+) Arthritis ,   Abdominal   Peds negative pediatric ROS (+) Gastroesophagael problems Hematology negative hematology ROS (+)   Anesthesia Other Findings   Reproductive/Obstetrics negative OB ROS                            Anesthesia Physical Anesthesia Plan  ASA: II  Anesthesia Plan: General   Post-op Pain Management:    Induction: Intravenous  Airway Management Planned: Oral ETT and LMA  Additional Equipment:   Intra-op Plan:   Post-operative Plan: Extubation in OR  Informed Consent: I have reviewed the patients History and Physical, chart, labs and discussed the procedure including the risks, benefits and alternatives for the proposed anesthesia with the patient or authorized representative who has indicated his/her understanding and acceptance.   Dental advisory given  Plan Discussed with: CRNA and Surgeon  Anesthesia Plan Comments:         Anesthesia Quick Evaluation

## 2015-06-10 NOTE — Progress Notes (Signed)
Pharmacy Antibiotic Follow-up Note  Bruce Morales is a 40 y.o. year-old male admitted on 06/05/2015.  The patient is currently on day 6 of Rocephin 2gm IV q12h + Vancomycin1gm IV q8h for meningitis.  Assessment/Plan: - Continue vancomycin 1250mg  IV Q8H - F/u renal fxn, C&S, clinical status and trough as needed  Temp (24hrs), Avg:98.1 F (36.7 C), Min:97.7 F (36.5 C), Max:98.4 F (36.9 C)   Recent Labs Lab 06/04/15 2006 06/05/15 0531 06/06/15 0528 06/07/15 0622 06/08/15 0545  WBC 12.8* 10.5 7.2 7.3 8.2     Recent Labs Lab 06/04/15 2006 06/05/15 0531 06/06/15 0528 06/07/15 0622 06/08/15 0545  CREATININE 1.11 1.07 0.89 0.91 0.79   Estimated Creatinine Clearance: 146.5 mL/min (by C-G formula based on Cr of 0.79).    Allergies  Allergen Reactions  . Hydrocodone Hives and Itching  . Oxycodone     Mild Itching, patient can tolerate oxycodone  . Percocet [Oxycodone-Acetaminophen]     Itching    Antimicrobials this admission: 1/5 >> Ceftriaxone >> 1/5 >> Vancomycin >> 1/5 >> Acyclovir >> 1/8  Levels/dose changes this admission: 1/6 at 1830 = 20 mcg/ml on Vanc 1gm IV q8h- no change 1/8 at 18:45 = 12 mcg/ml on Vanc 1gm q8 1/10 at 1325 = 3119mcg/ml on 1250mg  IV Q8H  Microbiology results: 1/5 BCx: NGTD 1/5 CSF culture: CoNS  Thank you for allowing pharmacy to be a part of this patient's care.  Samai Corea, Drake Leachachel Lynn PharmD 06/10/2015 2:25 PM

## 2015-06-10 NOTE — Consult Note (Signed)
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 Zannie CovePreetha Joseph  Principal Problem:   Staphylococcal meningitis Active Problems:   Status post lumbar laminectomy   Morbid obesity (HCC)   Headache   Sepsis (HCC)   . cefTRIAXone (ROCEPHIN)  IV  2 g Intravenous Q12H  . enoxaparin (LOVENOX) injection  40 mg Subcutaneous Q24H  . gabapentin  300 mg Oral TID  . HYDROmorphone   Intravenous 6 times per day  . polyethylene glycol  17 g Oral BID  . senna-docusate  2 tablet Oral BID  . sodium chloride  3 mL Intravenous Q12H  . vancomycin  1,250 mg Intravenous Q8H    Recommendations: 1. Continue vancomycin for a minimum of 6 weeks 2. Discontinue ceftriaxone 3. PICC placement   Assessment: Bruce Morales has coagulase-negative staph meningitis, almost certainly complicating postoperative lumbar infection. He is due to undergo lumbar MRI tomorrow. I will have a PICC placed and continue IV vancomycin.    HPI: Bruce Morales is a 40 y.o. male who underwent L5-S1 right hemilaminectomy on 04/17/2015. He had wound drainage for several weeks postoperatively. He was treated with empiric doxycycline for 10 days starting on 04/29/2015. Eventually the wound drainage stopped but he had progressively worsening low back pain, bilateral hip pain and right leg pain. About 10 days ago he began to develop progressively severe and unrelenting headache. That was followed by fever and chills eventually leading to his admission on 06/05/2015. 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   Review of Systems: Review of Systems  Constitutional: Positive for fever, chills, malaise/fatigue and diaphoresis.  Negative for weight loss.  HENT: Negative for sore throat.   Respiratory: Negative for cough, sputum production and shortness of breath.   Cardiovascular: Negative for chest pain.  Gastrointestinal: Negative for nausea, vomiting and diarrhea.  Genitourinary: Negative for dysuria and frequency.  Musculoskeletal: Positive for back pain and joint pain. Negative for myalgias.  Skin: Negative for rash.  Neurological: Positive for headaches. Negative for focal weakness.  Psychiatric/Behavioral: Negative for depression and substance abuse. The patient is not nervous/anxious.     Past Medical History  Diagnosis Date  . Gall stones   . Chronic lower back pain   . Anginal pain (HCC) 04/15/2014  . Chronic bronchitis (HCC)     "probably get it q yr"  . Arthritis     "wrists, knees, lower back" (04/16/2014)  . Chronic lower back pain     Social History  Substance Use Topics  . Smoking status: Former Smoker -- 0.50 packs/day for 27 years    Types: Cigarettes    Quit date: 03/11/2015  . Smokeless tobacco: Former NeurosurgeonUser  . Alcohol Use: 0.0 oz/week    0 Standard drinks or equivalent per week     Comment: 04/16/2014 "a 6 pack will last me a month"    Family History  Problem Relation Age of Onset  . Multiple sclerosis Mother   . Heart failure Maternal Grandfather   . Diabetes Father   . Fibromyalgia Sister   . Schizophrenia Brother   . Stroke Maternal Grandmother   . Diabetes Paternal Grandmother    Allergies  Allergen  Reactions  . Hydrocodone Hives and Itching  . Oxycodone     Mild Itching, patient can tolerate oxycodone  . Percocet [Oxycodone-Acetaminophen]     Itching     OBJECTIVE: Blood pressure 126/85, pulse 64, temperature 98.1 F (36.7 C), temperature source Oral, resp. rate 12, height 5\' 7"  (1.702 m), weight 232 lb 11.2 oz (105.552 kg), SpO2 95 %.  Physical Exam  Constitutional: He is oriented to person, place, and time.  He is alert and talkative. He appears somewhat  uncomfortable due to pain.  HENT:  Mouth/Throat: No oropharyngeal exudate.  Eyes: Conjunctivae are normal.  Neck: Neck supple.  Cardiovascular: Normal rate and regular rhythm.   No murmur heard. Pulmonary/Chest: Breath sounds normal.  Abdominal: Soft. He exhibits no mass. There is no tenderness.  Neurological: He is alert and oriented to person, place, and time.  Skin: No rash noted.  Psychiatric: Mood and affect normal.    Lab Results Lab Results  Component Value Date   WBC 8.2 06/08/2015   HGB 10.8* 06/08/2015   HCT 31.9* 06/08/2015   MCV 87.2 06/08/2015   PLT 189 06/08/2015    Lab Results  Component Value Date   CREATININE 0.79 06/08/2015   BUN 13 06/08/2015   NA 141 06/08/2015   K 4.1 06/08/2015   CL 104 06/08/2015   CO2 28 06/08/2015    Lab Results  Component Value Date   ALT 24 06/05/2015   AST 19 06/05/2015   ALKPHOS 59 06/05/2015   BILITOT 1.6* 06/05/2015     Microbiology: Recent Results (from the past 240 hour(s))  CSF culture     Status: None   Collection Time: 06/05/15 10:48 AM  Result Value Ref Range Status   Specimen Description CSF  Final   Special Requests NONE  Final   Gram Stain   Final    CYTOSPIN WBC PRESENT,BOTH PMN AND MONONUCLEAR NO ORGANISMS SEEN Gram Stain Report Called to,Read Back By and Verified With: DR KNAPP AT 1212 ON 1.5.17 BYSHUEA    Culture   Final    RARE STAPHYLOCOCCUS SPECIES (COAGULASE NEGATIVE) CRITICAL RESULT CALLED TO, READ BACK BY AND VERIFIED WITH: DR Izola Price 06/07/15 @ 0847 M VESTAL Performed at [facility]    Report Status 06/09/2015 FINAL  Final   Organism ID, Bacteria STAPHYLOCOCCUS SPECIES (COAGULASE NEGATIVE)  Final      Susceptibility   Staphylococcus species (coagulase negative) - MIC*    CIPROFLOXACIN <=0.5 SENSITIVE Sensitive     ERYTHROMYCIN >=8 RESISTANT Resistant     GENTAMICIN <=0.5 SENSITIVE Sensitive     OXACILLIN >=4 RESISTANT Resistant     TETRACYCLINE <=1 SENSITIVE Sensitive      VANCOMYCIN 2 SENSITIVE Sensitive     TRIMETH/SULFA <=10 SENSITIVE Sensitive     CLINDAMYCIN <=0.25 SENSITIVE Sensitive     RIFAMPIN <=0.5 SENSITIVE Sensitive     Inducible Clindamycin NEGATIVE Sensitive     * RARE STAPHYLOCOCCUS SPECIES (COAGULASE NEGATIVE)  Culture, blood (routine x 2)     Status: None   Collection Time: 06/05/15  8:10 PM  Result Value Ref Range Status   Specimen Description BLOOD LEFT HAND  Final   Special Requests BOTTLES DRAWN AEROBIC ONLY 5CC  Final   Culture   Final    NO GROWTH 5 DAYS Performed at [facility]    Report Status 06/10/2015 FINAL  Final  Culture, blood (routine x 2)     Status: None   Collection Time: 06/05/15  8:15 PM  Result Value Ref Range Status   Specimen Description BLOOD LEFT HAND  Final   Special Requests IN PEDIATRIC BOTTLE 3CC  Final   Culture   Final    NO GROWTH 5 DAYS Performed at [facility]    Report Status 06/10/2015 FINAL  Final  Culture, blood (routine x 2)     Status: None (Preliminary result)   Collection Time: 06/09/15  1:15 PM  Result Value Ref Range Status   Specimen Description BLOOD RIGHT ARM  Final   Special Requests BOTTLES DRAWN AEROBIC AND ANAEROBIC  10CC  Final   Culture   Final    NO GROWTH < 24 HOURS Performed at [facility]    Report Status PENDING  Incomplete  Culture, blood (routine x 2)     Status: None (Preliminary result)   Collection Time: 06/09/15  1:20 PM  Result Value Ref Range Status   Specimen Description BLOOD RIGHT HAND  Final   Special Requests IN PEDIATRIC BOTTLE  2CC  Final   Culture   Final    NO GROWTH < 24 HOURS Performed at [facility]    Report Status PENDING  Incomplete    Cliffton Asters, MD [facility] [phone] pager   [phone] cell 06/10/2015, 2:39 PM

## 2015-06-10 NOTE — Progress Notes (Signed)
Patient ID: Bruce Morales, male   DOB: June 28, 1975, 40 y.o.   MRN: [id number]  [facility] PROGRESS NOTE  Bruce Morales LHT:[id number] DOB: 03-20-1976 DOA: 06/05/2015 PCP: [name], DO   Brief narrative:    Pt is 40 yo male , s/p L5/S1 microdiscectomy in 11/16 who presented to [facility] ED with main concern of 3 days duration of progressively worsening generalized and diffuse headaches, constant and throbbing, 10/10 in severity, occasionally but not consistently radiating to the neck area, associated with photophobia ,subjective fevers and chills, nausea and several episodes of non bloody vomiting He was seen in ED 1/4 and was given Toradol and phenergan, he got better and was discharged home only to come back with worsening symptoms.  In Ed, LP s/o bacterial meningitis, started on broad spectrum Abx and Iv decadron, now cx with Coag negative staph Seen by NSG Dr.Jones in consultation, transferred to [facility] yesterday for MRI Ls spine under sedation   Assessment/Plan:    Principal Problem:  Bacterial meningitis - Has been on IV vancomycin and rocephin,for 5 days, Acyclovir stopped 1/8 - CSF with Coag negative staph, I consulted ID, dw/ Dr.Campbell - Stop dexamethasone as strep ruled out, ?Stop rocephin, defer to ID - pt clinically improving,  - NSG Dr.Jones consulting, for MRI lumbar spine under sedation today to r/o paraspinous infection or postoperative discitis    Severe low back pain -s/p L5/S1 microdiscectomy in 11/16  -MRI as above, on PCA per Dr.Jones   Sepsis ([place]) secondary to bacterial meningitis - pt met criteria for sepsis, now resolved  - ABX as noted above    Thrombocytopenia - reactive, resolved     Constipation -due to narcotics, add senokot and miralax  DVT prophylaxis - Lovenox SQ  Code Status: Full.  Family Communication:  plan of care discussed with the patient  Disposition Plan: Home pending above workup  IV access:  Peripheral  IV  Procedures and diagnostic studies:    Ct Head Wo Contrast 06/05/2015  No acute intracranial abnormality noted.   Ct Lumbar Spine W Contrast 05/16/2015 [place] of interval decompressive right hemi laminectomy at L5-S1. There is mildly hyperdense soft tissue density within the ventral epidural space posterior to the L5 vertebral body, which may reflect new disc herniation/extrusion. While no frank epidural abscess is identified, possible infection could be considered in the correct clinical setting. No evidence for osteomyelitis discitis within the adjacent L5-S1 disc space. No other evidence for infection about the operative site. No severe canal stenosis appreciated on this examination. Further evaluation with MRI, with and without contrast, would likely be helpful for further evaluation. 2. Mild degenerative disc bulge at L4-5 without stenosis.   Dg Chest Port 1 View 06/05/2015   Findings suggest bronchitis or interstitial pneumonitis. No focal infiltrate or effusion.   Dg Fluoro Guide Lumbar Puncture 06/05/2015  Successful lumbar puncture. The opening pressure could not be obtained. 15.5 cc of CSF were removed and submitted to the laboratory   Medical Consultants:  Dr.Myers d/w Neurology over the phone  Dr.Campbell ID [email or web address]   IAnti-Infectives:   Vancomycin 01/05 --> Rocephin 01/05 -->  Acyclovir 01/05 --> 06/08/2015  Domenic Polite, MD  [facility] Pager [phone]  If 7PM-7AM, please contact night-coverage [email or web address] Password [email or web address] 06/10/2015, 10:10 AM   LOS: 5 days   HPI/Subjective: No events overnight. Still with nausea but no vomiting since admission, headache 2/10 in severity.   Objective: Filed Vitals:   06/10/15 0206 06/10/15 0405 06/10/15 0500 06/10/15 5597  BP: 121/75   101/55  Pulse: 53   69  Temp: 98.4 F (36.9 C)   97.7 F (36.5 C)  TempSrc: Oral   Oral  Resp: '16 16  16  ' Height:      Weight:   105.552 kg (232 lb 11.2 oz)   SpO2: 98% 95%  99%    Intake/Output  Summary (Last 24 hours) at 06/10/15 1010 Last data filed at 06/10/15 0759  Gross per 24 hour  Intake    300 ml  Output    625 ml  Net   -325 ml    Exam:   General:  Pt is alert, follows commands appropriately, not in acute distress  Cardiovascular: Regular rate and rhythm, S1/S2, no murmurs, no rubs, no gallops  Respiratory: Clear to auscultation bilaterally, no wheezing, no crackles, no rhonchi  Abdomen: Soft, non tender, non distended, bowel sounds present, no guarding   Data Reviewed: Basic Metabolic Panel:  Recent Labs Lab 06/04/15 2006 06/05/15 0531 06/06/15 0528 06/07/15 0622 06/08/15 0545  NA 137 139 141 143 141  K 3.9 4.2 4.3 3.7 4.1  CL 103 101 105 108 104  CO2 '22 26 25 26 28  ' GLUCOSE 109* 109* 158* 108* 145*  BUN 17 22* 18 26* 13  CREATININE 1.11 1.07 0.89 0.91 0.79  CALCIUM 9.1 9.4 8.9 8.3* 9.1   Liver Function Tests:  Recent Labs Lab 06/04/15 1707 06/04/15 2006 06/05/15 0531  AST '17 20 19  ' ALT '27 28 24  ' ALKPHOS 69 60 59  BILITOT 1.7* 1.8* 1.6*  PROT 7.3 6.7 7.5  ALBUMIN 4.4 3.5 4.2    Recent Labs Lab 06/04/15 1707 06/04/15 2006 06/05/15 0531  LIPASE '14 26 16   ' CBC:  Recent Labs Lab 06/04/15 2006 06/05/15 0531 06/06/15 0528 06/07/15 0622 06/08/15 0545  WBC 12.8* 10.5 7.2 7.3 8.2  HGB 12.1* 12.2* 11.4* 10.0* 10.8*  HCT 35.2* 36.2* 33.9* 29.7* 31.9*  MCV 85.9 88.3 86.9 88.7 87.2  PLT 173 165 175 149* 189   Recent Results (from the past 240 hour(s))  CSF culture     Status: None   Collection Time: 06/05/15 10:48 AM  Result Value Ref Range Status   Specimen Description CSF  Final   Special Requests NONE  Final   Gram Stain   Final    CYTOSPIN WBC PRESENT,BOTH PMN AND MONONUCLEAR NO ORGANISMS SEEN Gram Stain Report Called to,Read Back By and Verified With: DR KNAPP AT 1212 ON 1.5.17 BYSHUEA    Culture   Final    RARE STAPHYLOCOCCUS SPECIES (COAGULASE NEGATIVE) CRITICAL RESULT CALLED TO, READ BACK BY AND VERIFIED WITH: DR  Doyle Askew 06/07/15 @ 0847 M VESTAL Performed at [facility]    Report Status 06/09/2015 FINAL  Final   Organism ID, Bacteria STAPHYLOCOCCUS SPECIES (COAGULASE NEGATIVE)  Final      Susceptibility   Staphylococcus species (coagulase negative) - MIC*    CIPROFLOXACIN <=0.5 SENSITIVE Sensitive     ERYTHROMYCIN >=8 RESISTANT Resistant     GENTAMICIN <=0.5 SENSITIVE Sensitive     OXACILLIN >=4 RESISTANT Resistant     TETRACYCLINE <=1 SENSITIVE Sensitive     VANCOMYCIN 2 SENSITIVE Sensitive     TRIMETH/SULFA <=10 SENSITIVE Sensitive     CLINDAMYCIN <=0.25 SENSITIVE Sensitive     RIFAMPIN <=0.5 SENSITIVE Sensitive     Inducible Clindamycin NEGATIVE Sensitive     * RARE STAPHYLOCOCCUS SPECIES (COAGULASE NEGATIVE)  Culture, blood (routine x 2)     Status:  None (Preliminary result)   Collection Time: 06/05/15  8:10 PM  Result Value Ref Range Status   Specimen Description BLOOD LEFT HAND  Final   Special Requests BOTTLES DRAWN AEROBIC ONLY 5CC  Final   Culture   Final    NO GROWTH 4 DAYS Performed at [facility]    Report Status PENDING  Incomplete  Culture, blood (routine x 2)     Status: None (Preliminary result)   Collection Time: 06/05/15  8:15 PM  Result Value Ref Range Status   Specimen Description BLOOD LEFT HAND  Final   Special Requests IN PEDIATRIC BOTTLE 3CC  Final   Culture   Final    NO GROWTH 4 DAYS Performed at [facility]    Report Status PENDING  Incomplete    Scheduled Meds: . acyclovir  650 mg Intravenous Q8H  . cefTRIAXone  IV  2 g Intravenous Q12H  . dexamethasone  10 mg Intravenous 4 times per day  . enoxaparin  injection  40 mg Subcutaneous Q24H  . gabapentin  300 mg Oral TID  . vancomycin  1,000 mg Intravenous Q8H   Continuous Infusions:

## 2015-06-10 NOTE — Progress Notes (Signed)
Peripherally Inserted Central Catheter/Midline Placement  The IV Nurse has discussed with the patient and/or persons authorized to consent for the patient, the purpose of this procedure and the potential benefits and risks involved with this procedure.  The benefits include less needle sticks, lab draws from the catheter and patient may be discharged home with the catheter.  Risks include, but not limited to, infection, bleeding, blood clot (thrombus formation), and puncture of an artery; nerve damage and irregular heat beat.  Alternatives to this procedure were also discussed.  PICC/Midline Placement Documentation  PICC Single Lumen 06/10/15 PICC Right Basilic 46 cm 0 cm (Active)  Indication for Insertion or Continuance of Line Home intravenous therapies (PICC only) 06/10/2015  4:00 PM  Exposed Catheter (cm) 0 cm 06/10/2015  4:00 PM  Dressing Change Due 06/17/15 06/10/2015  4:00 PM       Stacie GlazeJoyce, Surabhi Gadea Horton 06/10/2015, 4:50 PM

## 2015-06-10 NOTE — Progress Notes (Signed)
Patient ID: Bruce Morales, male   DOB: Nov 11, 1975, 40 y.o.   MRN: [id number] Subjective: Patient reports that his headache is much better and is now mild. He still complains of severe back pain radiating into the right hip and sometimes leg. Toradol has helped.  Objective: Vital signs in last 24 hours: Temp:  [97.7 F (36.5 C)-98.4 F (36.9 C)] 97.7 F (36.5 C) (01/10 0552) Pulse Rate:  [53-72] 69 (01/10 0552) Resp:  [14-20] 16 (01/10 0552) BP: (101-133)/(55-89) 101/55 mmHg (01/10 0552) SpO2:  [95 %-99 %] 99 % (01/10 0552) Weight:  [105.552 kg (232 lb 11.2 oz)] 105.552 kg (232 lb 11.2 oz) (01/10 0500)  Intake/Output from previous day: 01/09 0701 - 01/10 0700 In: 540 [P.O.:240; IV Piggyback:300] Out: 675 [Urine:675] Intake/Output this shift:    Neurologic: Grossly normal, he looks a little more comfortable today and is no longer riding in the bed.  Lab Results: Lab Results  Component Value Date   WBC 8.2 06/08/2015   HGB 10.8* 06/08/2015   HCT 31.9* 06/08/2015   MCV 87.2 06/08/2015   PLT 189 06/08/2015   Lab Results  Component Value Date   INR 1.17 06/05/2015   BMET Lab Results  Component Value Date   NA 141 06/08/2015   K 4.1 06/08/2015   CL 104 06/08/2015   CO2 28 06/08/2015   GLUCOSE 145* 06/08/2015   BUN 13 06/08/2015   CREATININE 0.79 06/08/2015   CALCIUM 9.1 06/08/2015    Studies/Results: No results found.  Assessment/Plan: I think he is scheduled for his MRI today. Continue antibiotics. I suspect he will need a PICC line and a 6 weeks course of IV and a box followed by a 6 week course of oral antibiotics. Continue pain control.   LOS: 5 days    Mika Griffitts,Skiler S 06/10/2015, 7:24 AM

## 2015-06-11 ENCOUNTER — Inpatient Hospital Stay (HOSPITAL_COMMUNITY): Payer: Worker's Compensation

## 2015-06-11 ENCOUNTER — Inpatient Hospital Stay (HOSPITAL_COMMUNITY): Payer: Worker's Compensation | Admitting: Certified Registered Nurse Anesthetist

## 2015-06-11 ENCOUNTER — Encounter (HOSPITAL_COMMUNITY)
Admission: EM | Disposition: A | Discharge status: Home Health | Payer: Self-pay | Source: Home / Self Care | Attending: Internal Medicine

## 2015-06-11 DIAGNOSIS — B9562 Methicillin resistant Staphylococcus aureus infection as the cause of diseases classified elsewhere: Secondary | ICD-10-CM

## 2015-06-11 DIAGNOSIS — Z9889 Other specified postprocedural states: Secondary | ICD-10-CM

## 2015-06-11 HISTORY — PX: RADIOLOGY WITH ANESTHESIA: SHX6223

## 2015-06-11 LAB — BASIC METABOLIC PANEL
Anion gap: 10 (ref 5–15)
BUN: 18 mg/dL (ref 6–20)
CO2: 31 mmol/L (ref 22–32)
Calcium: 8.4 mg/dL — ABNORMAL LOW (ref 8.9–10.3)
Chloride: 101 mmol/L (ref 101–111)
Creatinine, Ser: 0.9 mg/dL (ref 0.61–1.24)
GFR calc Af Amer: >60 mL/min (ref >60)
GFR calc non Af Amer: >60 mL/min (ref >60)
Glucose, Bld: 97 mg/dL (ref 65–99)
Potassium: 4.1 mmol/L (ref 3.5–5.1)
Sodium: 142 mmol/L (ref 135–145)

## 2015-06-11 LAB — CBC
HCT: 35.3 % — ABNORMAL LOW (ref 39.0–52.0)
Hemoglobin: 11.4 g/dL — ABNORMAL LOW (ref 13.0–17.0)
MCH: 28.7 pg (ref 26.0–34.0)
MCHC: 32.3 g/dL (ref 30.0–36.0)
MCV: 88.9 fL (ref 78.0–100.0)
Platelets: 202 10*3/uL (ref 150–400)
RBC: 3.97 MIL/uL — ABNORMAL LOW (ref 4.22–5.81)
RDW: 12 % (ref 11.5–15.5)
WBC: 10.4 10*3/uL (ref 4.0–10.5)

## 2015-06-11 LAB — SEDIMENTATION RATE: Sed Rate: 15 mm/hr (ref 0–16)

## 2015-06-11 LAB — C-REACTIVE PROTEIN: CRP: 0.6 mg/dL (ref <1.0)

## 2015-06-11 SURGERY — RADIOLOGY WITH ANESTHESIA
Anesthesia: General

## 2015-06-11 MED ORDER — HYDROMORPHONE HCL 1 MG/ML IJ SOLN
INTRAMUSCULAR | Status: AC
  Filled 2015-06-11: qty 1

## 2015-06-11 MED ORDER — FLEET ENEMA 7-19 GM/118ML RE ENEM
1.0000 | ENEMA | Freq: Once | RECTAL | Status: AC
  Administered 2015-06-11: 1 via RECTAL
  Filled 2015-06-11: qty 1

## 2015-06-11 MED ORDER — LACTATED RINGERS IV SOLN
INTRAVENOUS | Status: DC
  Administered 2015-06-11: 18:00:00 via INTRAVENOUS

## 2015-06-11 MED ORDER — VANCOMYCIN HCL 10 G IV SOLR
1750.0000 mg | Freq: Two times a day (BID) | INTRAVENOUS | Status: DC
  Administered 2015-06-12 – 2015-06-15 (×8): 1750 mg via INTRAVENOUS
  Filled 2015-06-11 (×10): qty 1750

## 2015-06-11 MED ORDER — HYDROMORPHONE HCL 1 MG/ML IJ SOLN
0.2500 mg | INTRAMUSCULAR | Status: DC | PRN
  Administered 2015-06-11 (×3): 0.5 mg via INTRAVENOUS

## 2015-06-11 MED ORDER — GADOBENATE DIMEGLUMINE 529 MG/ML IV SOLN
20.0000 mL | Freq: Once | INTRAVENOUS | Status: AC
  Administered 2015-06-11: 20 mL via INTRAVENOUS

## 2015-06-11 MED ORDER — PROMETHAZINE HCL 25 MG/ML IJ SOLN: 6.2500 mg | INTRAMUSCULAR | Status: DC | PRN

## 2015-06-11 NOTE — Anesthesia Postprocedure Evaluation (Signed)
Anesthesia Post Note  Patient: Bruce Morales  Procedure(s) Performed: Procedure(s) (LRB): MRI LUMBAR WITH AND WITHOUT CONTRAST (N/A)  Patient location during evaluation: PACU Anesthesia Type: General Level of consciousness: awake and alert Pain management: pain level controlled Vital Signs Assessment: post-procedure vital signs reviewed and stable Respiratory status: spontaneous breathing, nonlabored ventilation, respiratory function stable and patient connected to nasal cannula oxygen Cardiovascular status: blood pressure returned to baseline and stable Postop Assessment: no signs of nausea or vomiting Anesthetic complications: no    Last Vitals:  Filed Vitals:   06/11/15 1515 06/11/15 1530  BP: 139/83 143/92  Pulse: 70 69  Temp:    Resp: 15 16    Last Pain:  Filed Vitals:   06/11/15 1538  PainSc: 6                  Tanashia Ciesla,JAMES TERRILL

## 2015-06-11 NOTE — Progress Notes (Signed)
Pharmacy Antibiotic Follow-up Note  Bruce Morales is a 40 y.o. year-old male admitted on 06/05/2015.  The patient is currently on day 7 of Vancomycin for meningitis. Per ID, pt will require 6 weeks of vancomycin therapy.   Assessment/Plan: - Change vancomycin to 1750mg  IV Q12H to facilitate outpatient administration - hopefully will be able to check a trough prior to discharge to verify appropriateness of his dose - F/u renal fxn, C&S, clinical status and trough as needed  Temp (24hrs), Avg:98.1 F (36.7 C), Min:97.3 F (36.3 C), Max:98.4 F (36.9 C)   Recent Labs Lab 06/05/15 0531 06/06/15 0528 06/07/15 0622 06/08/15 0545 06/11/15 0530  WBC 10.5 7.2 7.3 8.2 10.4     Recent Labs Lab 06/05/15 0531 06/06/15 0528 06/07/15 0622 06/08/15 0545 06/11/15 0530  CREATININE 1.07 0.89 0.91 0.79 0.90   Estimated Creatinine Clearance: 130.2 mL/min (by C-G formula based on Cr of 0.9).    Allergies  Allergen Reactions  . Hydrocodone Hives and Itching  . Oxycodone     Mild Itching, patient can tolerate oxycodone  . Percocet [Oxycodone-Acetaminophen]     Itching    Antimicrobials this admission: 1/5 >> Ceftriaxone >>1/10 1/5 >> Vancomycin >> 1/5 >> Acyclovir >> 1/8  Levels/dose changes this admission: 1/6 at 1830 = 20 mcg/ml on Vanc 1gm IV q8h- no change 1/8 at 18:45 = 12 mcg/ml on Vanc 1gm q8 1/10 at 1325 = 2918mcg/ml on 1250mg  IV Q8H  Microbiology results: 1/5 BCx: NGTD 1/5 CSF culture: CoNS  Thank you for allowing pharmacy to be a part of this patient's care.  Dinara Lupu, Drake Leachachel Lynn PharmD 06/11/2015 9:52 AM

## 2015-06-11 NOTE — Progress Notes (Signed)
Patient ID: [name], male   DOB: [birth year], 40 y.o.   MRN: [id number]  [facility] PROGRESS NOTE  EATON FOLMAR VZS:[id number] DOB: 1975-07-13 DOA: 06/05/2015 PCP: [name], DO   Brief narrative:   Bruce Morales is a 40 y.o. male who underwent L5-S1 right hemilaminectomy on 04/17/2015. He had wound drainage for several weeks postop, treated with empiric doxycycline from 04/29/2015. Eventually the wound drainage stopped but he had progressively worsening low back pain, bilateral hip pain and right leg pain. 5days prior to admission,  he developed progressively severe and unrelenting headache, followed by fever and chills eventually leading to his admission on 06/05/2015.  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 Seen by NSG Dr.Jones in consultation, transferred to [facility] 1/9 for MRI Ls spine under sedation ID Dr.Campbell consulted too, MRI still pending   Assessment/Plan:    Principal Problem:  Bacterial meningitis - Has been on IV vancomycin and rocephin,for 5 days, Acyclovir stopped 1/8, stopped Rocephin 1/10 - CSF with Coag negative staph - pt clinically improving,  - NSG Dr.Jones consulting, for MRI lumbar spine under sedation today to r/o paraspinous infection or postoperative discitis -Appreciate Dr.Campbells consult, for atleast 6 weeks of IV Vanc    Severe low back pain -s/p L5/S1 microdiscectomy in 11/16  -MRI as above, on PCA per Dr.Jones   Sepsis ([facility]) secondary to bacterial meningitis - pt met criteria for sepsis, now resolved  - ABX as noted above    Thrombocytopenia - reactive, resolved     Constipation -BM yesterday -due to narcotics, continue senokot and miralax  DVT prophylaxis - Lovenox SQ  Code Status: Full.  Family Communication:  plan of care discussed with the patient  Disposition Plan: Home pending above workup  IV access:  Peripheral  IV  Procedures and diagnostic studies:    Dg Fluoro Guide Lumbar Puncture 06/05/2015  Successful lumbar puncture. The opening pressure could not be obtained. 15.5 cc of CSF were removed and submitted to the laboratory   Medical Consultants:  Dr.Myers d/w Neurology over the phone  Dr.Campbell ID Dr.[email or web address]   IAnti-Infectives:   Vancomycin 01/05 --> Rocephin 01/05 --> 06/10/15 Acyclovir 01/05 --> 06/08/2015  Bruce Polite, MD  [place] Pager [phone]  If 7PM-7AM, please contact night-coverage [email or web address] Password [email or web address] 06/11/2015, 9:13 AM   LOS: 6 days   HPI/Subjective: Small Bm yesterday, still with low back pain, MRI still pending  Objective: Filed Vitals:   06/11/15 0125 06/11/15 0400 06/11/15 0609 06/11/15 0833  BP: 121/63  114/83   Pulse: 71  57   Temp: 98.2 F (36.8 C)  97.3 F (36.3 C)   TempSrc: Oral  Oral   Resp: _0 Height:      Weight:      SpO2: 97% 97% 98% 97%    Intake/Output Summary (Last 24 hours) at 06/11/15 0913 Last data filed at 06/11/15 0712  Gross per 24 hour  Intake    400 ml  Output   1875 ml  Net  -1475 ml    Exam:   General:  AAOx3  Cardiovascular: Regular rate and rhythm, S1/S2, no murmurs, no rubs, no gallops  Respiratory: Clear to auscultation bilaterally, no wheezing, no crackles, no rhonchi  Abdomen: Soft, non tender, non distended, bowel sounds present, no guarding Ext: no edema Neuro: moves all extremities, no localizing signs  Data Reviewed: Basic Metabolic Panel:  Recent Labs Lab 06/05/15 0531 06/06/15 0528 06/07/15 0622 06/08/15  0545 06/11/15 0530  NA 139 141 143 141 142  K 4.2 4.3 3.7 4.1 4.1  CL 101 105 108 104 101  CO2 _0 GLUCOSE 109* 158* 108* 145* 97  BUN 22* 18 26* 13 18  CREATININE 1.07 0.89 0.91 0.79 0.90  CALCIUM 9.4 8.9 8.3* 9.1 8.4*   Liver Function Tests:  Recent Labs Lab 06/04/15 1707 06/04/15 2006 06/05/15 0531  AST _1 ALT _2 ALKPHOS 69 60 59  BILITOT  1.7* 1.8* 1.6*  PROT 7.3 6.7 7.5  ALBUMIN 4.4 3.5 4.2    Recent Labs Lab 06/04/15 1707 06/04/15 2006 06/05/15 0531  LIPASE _3 CBC:  Recent Labs Lab 06/05/15 0531 06/06/15 0528 06/07/15 0622 06/08/15 0545 06/11/15 0530  WBC 10.5 7.2 7.3 8.2 10.4  HGB 12.2* 11.4* 10.0* 10.8* 11.4*  HCT 36.2* 33.9* 29.7* 31.9* 35.3*  MCV 88.3 86.9 88.7 87.2 88.9  PLT 165 175 149* 189 202   Recent Results (from the past 240 hour(s))  CSF culture     Status: None   Collection Time: 06/05/15 10:48 AM  Result Value Ref Range Status   Specimen Description CSF  Final   Special Requests NONE  Final   Gram Stain   Final    CYTOSPIN WBC PRESENT,BOTH PMN AND MONONUCLEAR NO ORGANISMS SEEN Gram Stain Report Called to,Read Back By and Verified With: DR KNAPP AT 1212 ON 1.5.17 BYSHUEA    Culture   Final    RARE STAPHYLOCOCCUS SPECIES (COAGULASE NEGATIVE) CRITICAL RESULT CALLED TO, READ BACK BY AND VERIFIED WITH: DR Doyle Askew 06/07/15 @ 1610 M VESTAL Performed at [facility]    Report Status 06/09/2015 FINAL  Final   Organism ID, Bacteria STAPHYLOCOCCUS SPECIES (COAGULASE NEGATIVE)  Final      Susceptibility   Staphylococcus species (coagulase negative) - MIC*    CIPROFLOXACIN <=0.5 SENSITIVE Sensitive     ERYTHROMYCIN >=8 RESISTANT Resistant     GENTAMICIN <=0.5 SENSITIVE Sensitive     OXACILLIN >=4 RESISTANT Resistant     TETRACYCLINE <=1 SENSITIVE Sensitive     VANCOMYCIN 2 SENSITIVE Sensitive     TRIMETH/SULFA <=10 SENSITIVE Sensitive     CLINDAMYCIN <=0.25 SENSITIVE Sensitive     RIFAMPIN <=0.5 SENSITIVE Sensitive     Inducible Clindamycin NEGATIVE Sensitive     * RARE STAPHYLOCOCCUS SPECIES (COAGULASE NEGATIVE)  Culture, blood (routine x 2)     Status: None   Collection Time: 06/05/15  8:10 PM  Result Value Ref Range Status   Specimen Description BLOOD LEFT HAND  Final   Special Requests BOTTLES DRAWN AEROBIC ONLY 5CC  Final   Culture   Final    NO GROWTH 5  DAYS Performed at [facility]    Report Status 06/10/2015 FINAL  Final  Culture, blood (routine x 2)     Status: None   Collection Time: 06/05/15  8:15 PM  Result Value Ref Range Status   Specimen Description BLOOD LEFT HAND  Final   Special Requests IN PEDIATRIC BOTTLE 3CC  Final   Culture   Final    NO GROWTH 5 DAYS Performed at [facility]    Report Status 06/10/2015 FINAL  Final  Culture, blood (routine x 2)     Status: None (Preliminary result)   Collection Time: 06/09/15  1:15 PM  Result Value Ref Range Status   Specimen Description BLOOD RIGHT ARM  Final  Special Requests BOTTLES DRAWN AEROBIC AND ANAEROBIC  10CC  Final   Culture   Final    NO GROWTH < 24 HOURS Performed at [facility]    Report Status PENDING  Incomplete  Culture, blood (routine x 2)     Status: None (Preliminary result)   Collection Time: 06/09/15  1:20 PM  Result Value Ref Range Status   Specimen Description BLOOD RIGHT HAND  Final   Special Requests IN PEDIATRIC BOTTLE  2CC  Final   Culture   Final    NO GROWTH < 24 HOURS Performed at [facility]    Report Status PENDING  Incomplete    Scheduled Meds: . acyclovir  650 mg Intravenous Q8H  . cefTRIAXone  IV  2 g Intravenous Q12H  . dexamethasone  10 mg Intravenous 4 times per day  . enoxaparin  injection  40 mg Subcutaneous Q24H  . gabapentin  300 mg Oral TID  . vancomycin  1,000 mg Intravenous Q8H   Continuous Infusions:

## 2015-06-11 NOTE — Transfer of Care (Signed)
Immediate Anesthesia Transfer of Care Note  Patient: Bruce Morales  Procedure(s) Performed: Procedure(s): MRI LUMBAR WITH AND WITHOUT CONTRAST (N/A)  Patient Location: PACU  Anesthesia Type:General  Level of Consciousness: awake, alert , oriented and patient cooperative  Airway & Oxygen Therapy: Patient Spontanous Breathing and Patient connected to nasal cannula oxygen  Post-op Assessment: Report given to RN, Post -op Vital signs reviewed and stable and Patient moving all extremities  Post vital signs: Reviewed and stable  Last Vitals:  Filed Vitals:   06/11/15 1122 06/11/15 1453  BP:  141/87  Pulse:  71  Temp:  36.9 C  Resp: 10 14    Complications: No apparent anesthesia complications

## 2015-06-11 NOTE — Progress Notes (Signed)
Patient ID: Bruce Morales, male   DOB: 1975/12/30, 40 y.o.   MRN: [id number]         [facility]    Date of Admission:  06/05/2015           Day 6 vancomycin  Principal Problem:   Staphylococcal meningitis Active Problems:   Status post lumbar laminectomy   Morbid obesity (HCC)   Headache   Sepsis (HCC)   . enoxaparin (LOVENOX) injection  40 mg Subcutaneous Q24H  . gabapentin  300 mg Oral TID  . HYDROmorphone      . HYDROmorphone      . HYDROmorphone   Intravenous 6 times per day  . polyethylene glycol  17 g Oral BID  . senna-docusate  2 tablet Oral BID  . sodium chloride  3 mL Intravenous Q12H  . vancomycin  1,750 mg Intravenous Q12H    SUBJECTIVE: He has not noted much change in his headache, back and hip pain.  Review of Systems: Review of Systems  Constitutional: Positive for malaise/fatigue. Negative for fever, chills, weight loss and diaphoresis.  Respiratory: Negative for cough, sputum production and shortness of breath.   Cardiovascular: Negative for chest pain.  Musculoskeletal: Positive for back pain and neck pain.  Neurological: Positive for weakness and headaches.    Past Medical History  Diagnosis Date  . Gall stones   . Chronic lower back pain   . Anginal pain (HCC) 04/15/2014  . Chronic bronchitis (HCC)     "probably get it q yr"  . Arthritis     "wrists, knees, lower back" (04/16/2014)  . Chronic lower back pain     Social History  Substance Use Topics  . Smoking status: Former Smoker -- 0.50 packs/day for 27 years    Types: Cigarettes    Quit date: 03/11/2015  . Smokeless tobacco: Former Neurosurgeon  . Alcohol Use: 0.0 oz/week    0 Standard drinks or equivalent per week     Comment: 04/16/2014 "a 6 pack will last me a month"    Family History  Problem Relation Age of Onset  . Multiple sclerosis Mother   . Heart failure Maternal Grandfather   . Diabetes Father   . Fibromyalgia Sister   . Schizophrenia  Brother   . Stroke Maternal Grandmother   . Diabetes Paternal Grandmother    Allergies  Allergen Reactions  . Hydrocodone Hives and Itching  . Oxycodone     Mild Itching, patient can tolerate oxycodone  . Percocet [Oxycodone-Acetaminophen]     Itching     OBJECTIVE: Filed Vitals:   06/11/15 1530 06/11/15 1545 06/11/15 1625 06/11/15 1722  BP: 143/92 142/90 145/80 130/63  Pulse: 69 58 61 69  Temp:  98.4 F (36.9 C) 98.8 F (37.1 C) 98.1 F (36.7 C)  TempSrc:   Oral Oral  Resp: 16 14 17 16   Height:      Weight:      SpO2: 99% 100% 95% 98%   Body mass index is 36.44 kg/(m^2).  Physical Exam  Constitutional:  He is alert and relatively comfortable. He is talking to his mother on the phone.  Cardiovascular: Normal rate and regular rhythm.   No murmur heard. Pulmonary/Chest: Breath sounds normal.  Neurological: He is alert.  Skin: No rash noted.  Psychiatric: Mood and affect normal.    Lab Results Lab Results  Component Value Date   WBC 10.4 06/11/2015   HGB 11.4* 06/11/2015   HCT 35.3* 06/11/2015  MCV 88.9 06/11/2015   PLT 202 06/11/2015    Lab Results  Component Value Date   CREATININE 0.90 06/11/2015   BUN 18 06/11/2015   NA 142 06/11/2015   K 4.1 06/11/2015   CL 101 06/11/2015   CO2 31 06/11/2015    Lab Results  Component Value Date   ALT 24 06/05/2015   AST 19 06/05/2015   ALKPHOS 59 06/05/2015   BILITOT 1.6* 06/05/2015     Microbiology: Recent Results (from the past 240 hour(s))  CSF culture     Status: None   Collection Time: 06/05/15 10:48 AM  Result Value Ref Range Status   Specimen Description CSF  Final   Special Requests NONE  Final   Gram Stain   Final    CYTOSPIN WBC PRESENT,BOTH PMN AND MONONUCLEAR NO ORGANISMS SEEN Gram Stain Report Called to,Read Back By and Verified With: DR KNAPP AT 1212 ON 1.5.17 BYSHUEA    Culture   Final    RARE STAPHYLOCOCCUS SPECIES (COAGULASE NEGATIVE) CRITICAL RESULT CALLED TO, READ BACK BY AND  VERIFIED WITH: DR Izola PriceMYERS 06/07/15 @ 0847 M VESTAL Performed [facility]    Report Status 06/09/2015 FINAL  Final   Organism ID, Bacteria STAPHYLOCOCCUS SPECIES (COAGULASE NEGATIVE)  Final      Susceptibility   Staphylococcus species (coagulase negative) - MIC*    CIPROFLOXACIN <=0.5 SENSITIVE Sensitive     ERYTHROMYCIN >=8 RESISTANT Resistant     GENTAMICIN <=0.5 SENSITIVE Sensitive     OXACILLIN >=4 RESISTANT Resistant     TETRACYCLINE <=1 SENSITIVE Sensitive     VANCOMYCIN 2 SENSITIVE Sensitive     TRIMETH/SULFA <=10 SENSITIVE Sensitive     CLINDAMYCIN <=0.25 SENSITIVE Sensitive     RIFAMPIN <=0.5 SENSITIVE Sensitive     Inducible Clindamycin NEGATIVE Sensitive     * RARE STAPHYLOCOCCUS SPECIES (COAGULASE NEGATIVE)  Culture, blood (routine x 2)     Status: None   Collection Time: 06/05/15  8:10 PM  Result Value Ref Range Status   Specimen Description BLOOD LEFT HAND  Final   Special Requests BOTTLES DRAWN AEROBIC ONLY 5CC  Final   Culture   Final    NO GROWTH 5 DAYS Performed at [facility]    Report Status 06/10/2015 FINAL  Final  Culture, blood (routine x 2)     Status: None   Collection Time: 06/05/15  8:15 PM  Result Value Ref Range Status   Specimen Description BLOOD LEFT HAND  Final   Special Requests IN PEDIATRIC BOTTLE 3CC  Final   Culture   Final    NO GROWTH 5 DAYS Performed at [facility]    Report Status 06/10/2015 FINAL  Final  Culture, blood (routine x 2)     Status: None (Preliminary result)   Collection Time: 06/09/15  1:15 PM  Result Value Ref Range Status   Specimen Description BLOOD RIGHT ARM  Final   Special Requests BOTTLES DRAWN AEROBIC AND ANAEROBIC  10CC  Final   Culture   Final    NO GROWTH 2 DAYS [facility]formed [facility]     Report Status PENDING  Incomplete  Culture, blood (routine x 2)     Status: None (Preliminary result)   Collection Time: 06/09/15  1:20 PM  Result Value Ref Range Status   Specimen  Description BLOOD RIGHT HAND  Final   Special Requests IN PEDIATRIC BOTTLE  [id number]  Final   Culture   Final  NO GROWTH 2 DAYS Performed at [facility]    Report Status PENDING  Incomplete   Lumbar spine MRI 06/11/2015  IMPRESSION: 1. Small defect in the thecal sac at L5-S1 on the right without a pseudomeningocele. 2. Enhancement of the meninges and dura posteriorly consistent with the clinical diagnosis of meningitis. 3. Subdural fluid collection deviates the nerve roots in the thecal sac anteriorly as described above. 4. No evidence of discitis or osteomyelitis or abscess. Critical Value/emergent results were called by telephone at the time of interpretation on 06/11/2015 at 3:50 pm to Dr. Marikay Alar, who verbally acknowledged these results.  Electronically Signed  By: Francene Boyers M.D.  On: 06/11/2015 15:55  ASSESSMENT: His MRI confirms a lumbar wound infection complicated by meningitis. He will need a minimum of 6 weeks of IV vancomycin for his methicillin-resistant coagulase-negative staph infection.  PLAN: 1. Continue vancomycin  Cliffton Asters, MD [facility] [phone] pager   [phone] cell 06/11/2015, 5:35 PM

## 2015-06-11 NOTE — Care Management Note (Addendum)
Case Management Note  Patient Details  Name: Bruce Morales MRN: [id number] Date of Birth: 09/28/1975  Subjective/Objective:                    Action/Plan: CM noted in chart that patient will be discharging home on IV antibiotic therapy. CM met with patient to determine contact information for his worker's comp case manager.  Patient's claim # [id number] is being handled by [employer] ph #[phone], fax#[phone].  CM spoke with Janice McDaniels, patient's claims adjuster ph#[phone] and attempted to explain discharge needs.  Ms McDaniels stated that she had never had to set anything like IV Antibiotics up and was unsure how to do so.  CM was referred to Stacy Barfield RN Case Manager with [employer] ph#[phone], fax#[phone].  Voicemail was left requesting assistance with discharge planning.  Awaiting return call.  Addendum:  Received a call back from Stacy Barfield. Requested clinicals were faxed.  CM spoke with Dr Joseph (attending MD), who states that antibiotic orders will be handled once the MRI has resulted.  CM will continue to follow.  Expected Discharge Date:   (UNKNOWN)               Expected Discharge Plan:  Home w Home Health Services  In-House Referral:     Discharge planning Services  CM Consult  Post Acute Care Choice:    Choice offered to:     DME Arranged:    DME Agency:     HH Arranged:  IV Antibiotics HH Agency:     Status of Service:  In process, will continue to follow  Medicare Important Message Given:    Date Medicare IM Given:    Medicare IM give by:    Date Additional Medicare IM Given:    Additional Medicare Important Message give by:     If discussed at Long Length of Stay Meetings, dates discussed:    Additional Comments:  ,  C, RN 06/11/2015, 10:30 AM [phone] 

## 2015-06-12 ENCOUNTER — Encounter (HOSPITAL_COMMUNITY): Payer: Self-pay | Admitting: Radiology

## 2015-06-12 DIAGNOSIS — S31000D Unspecified open wound of lower back and pelvis without penetration into retroperitoneum, subsequent encounter: Secondary | ICD-10-CM

## 2015-06-12 DIAGNOSIS — Y838 Other surgical procedures as the cause of abnormal reaction of the patient, or of later complication, without mention of misadventure at the time of the procedure: Secondary | ICD-10-CM

## 2015-06-12 DIAGNOSIS — T814XXD Infection following a procedure, subsequent encounter: Secondary | ICD-10-CM

## 2015-06-12 LAB — BASIC METABOLIC PANEL
Anion gap: 7 (ref 5–15)
BUN: 11 mg/dL (ref 6–20)
CO2: 35 mmol/L — ABNORMAL HIGH (ref 22–32)
Calcium: 8.3 mg/dL — ABNORMAL LOW (ref 8.9–10.3)
Chloride: 99 mmol/L — ABNORMAL LOW (ref 101–111)
Creatinine, Ser: 0.87 mg/dL (ref 0.61–1.24)
GFR calc Af Amer: >60 mL/min (ref >60)
GFR calc non Af Amer: >60 mL/min (ref >60)
Glucose, Bld: 101 mg/dL — ABNORMAL HIGH (ref 65–99)
Potassium: 3.5 mmol/L (ref 3.5–5.1)
Sodium: 141 mmol/L (ref 135–145)

## 2015-06-12 LAB — CBC
HCT: 35.4 % — ABNORMAL LOW (ref 39.0–52.0)
Hemoglobin: 11.7 g/dL — ABNORMAL LOW (ref 13.0–17.0)
MCH: 29.5 pg (ref 26.0–34.0)
MCHC: 33.1 g/dL (ref 30.0–36.0)
MCV: 89.4 fL (ref 78.0–100.0)
Platelets: 164 10*3/uL (ref 150–400)
RBC: 3.96 MIL/uL — ABNORMAL LOW (ref 4.22–5.81)
RDW: 12.4 % (ref 11.5–15.5)
WBC: 9.3 10*3/uL (ref 4.0–10.5)

## 2015-06-12 MED ORDER — MORPHINE SULFATE ER 30 MG PO TBCR: 30.0000 mg | EXTENDED_RELEASE_TABLET | Freq: Two times a day (BID) | ORAL | Status: DC

## 2015-06-12 MED ORDER — NALOXEGOL OXALATE 25 MG PO TABS
25.0000 mg | ORAL_TABLET | Freq: Every day | ORAL | Status: DC
  Administered 2015-06-12 – 2015-06-15 (×4): 25 mg via ORAL
  Filled 2015-06-12 (×5): qty 1

## 2015-06-12 MED ORDER — VANCOMYCIN HCL 10 G IV SOLR: 1750.0000 mg | Freq: Two times a day (BID) | INTRAVENOUS | Status: DC

## 2015-06-12 MED ORDER — OXYCODONE HCL ER 20 MG PO T12A: 20.0000 mg | EXTENDED_RELEASE_TABLET | Freq: Two times a day (BID) | ORAL | Status: DC

## 2015-06-12 MED ORDER — HYDROMORPHONE HCL 1 MG/ML IJ SOLN: 0.5000 mg | INTRAMUSCULAR | Status: DC | PRN

## 2015-06-12 MED ORDER — HYDROMORPHONE HCL 2 MG PO TABS: 2.0000 mg | ORAL_TABLET | Freq: Four times a day (QID) | ORAL | Status: DC | PRN

## 2015-06-12 MED ORDER — HYDROMORPHONE HCL 2 MG PO TABS
4.0000 mg | ORAL_TABLET | Freq: Four times a day (QID) | ORAL | Status: DC | PRN
  Filled 2015-06-12: qty 2

## 2015-06-12 MED ORDER — MELOXICAM 7.5 MG PO TABS
7.5000 mg | ORAL_TABLET | Freq: Two times a day (BID) | ORAL | Status: DC | PRN
  Administered 2015-06-12 – 2015-06-15 (×4): 7.5 mg via ORAL
  Filled 2015-06-12 (×4): qty 1

## 2015-06-12 MED ORDER — HYDROMORPHONE HCL 1 MG/ML IJ SOLN
0.5000 mg | INTRAMUSCULAR | Status: DC | PRN
  Administered 2015-06-12 – 2015-06-15 (×16): 0.5 mg via INTRAVENOUS
  Filled 2015-06-12 (×16): qty 1

## 2015-06-12 MED ORDER — ZOLPIDEM TARTRATE 5 MG PO TABS
5.0000 mg | ORAL_TABLET | Freq: Every evening | ORAL | Status: DC | PRN
  Administered 2015-06-14: 5 mg via ORAL
  Filled 2015-06-12: qty 1

## 2015-06-12 MED ORDER — OXYCODONE HCL ER 10 MG PO T12A
20.0000 mg | EXTENDED_RELEASE_TABLET | Freq: Two times a day (BID) | ORAL | Status: DC
  Administered 2015-06-12 – 2015-06-15 (×7): 20 mg via ORAL
  Filled 2015-06-12 (×7): qty 2

## 2015-06-12 NOTE — Progress Notes (Signed)
Pt c/o constipation, and some nausea. Offered gi cocktail and nausea med. Given along with prn pain meds. Dr Yetta Barrejones in to assess, provided movantik, explained [plan to pt.

## 2015-06-12 NOTE — Progress Notes (Signed)
Patient called for help and nurse went in to assist him. Patient was very verbal with nurse about being in so much pain, no one listening to him and he spilled urine all over the bed. While talking with patient, Hospitalist walked in room and patient was very verbal with him also. Nurse gave patient all meds he could get at the time. Patient called again in about 35 mins and still complaining of no relief. Encouraged to get out of bed and sit up in chair but patient screamed when trying stating that he could not sit up straight and wants to lie down. With all this going on, patient does not seem to be in any distress. Will notify Dr. Yetta BarreJones.

## 2015-06-12 NOTE — Care Management Note (Signed)
Case Management Note  Patient Details  Name: Bruce Morales MRN: [id number] Date of Birth: 07/05/1975  Subjective/Objective:                    Action/Plan: Updated clinicals, HHRN orders, and prescriptions were faxed to Alona BeneStacy Barfield, RN Case Manager with [facility] [employer].  CM spoke with Ms Beckey RutterBarfield to verify that the plan is for discharge home on 06/13/15. Expected Discharge Date:   (UNKNOWN)               Expected Discharge Plan:  Home w Home Health Services  In-House Referral:     Discharge planning Services  CM Consult  Post Acute Care Choice:    Choice offered to:     DME Arranged:    DME Agency:     HH Arranged:  IV Antibiotics, RN (to be arranged through worker's comp) HH Agency:     Status of Service:  Completed, signed off  Medicare Important Message Given:    Date Medicare IM Given:    Medicare IM give by:    Date Additional Medicare IM Given:    Additional Medicare Important Message give by:     If discussed at Long Length of Stay Meetings, dates discussed:    Additional Comments:  Anda KraftRobarge, Rena Sweeden C, RN 06/12/2015, 10:55 AM [phone]

## 2015-06-12 NOTE — Progress Notes (Signed)
Patient ID: Bruce Morales, male   DOB: 28-Sep-1975, 40 y.o.   MRN: [id number] Patient seen and examined. I talked to him for quite a long time about his MRI findings and his current situation. His MRI was compared to his previous MRI done about 3 weeks ago and I had a long conversation with Dr. Jena GaussMaxwell who read the films. There probably is a small dural defect at L5-S1 on the right without evidence of pseudomeningocele, and this would be expected given his meningitis. Otherwise the findings are not overly severe. There is some signal change within the disc space and there is some mild enhancement but this is to be expected this early after discectomy. It is not diagnostic of discitis. His sedimentation rate is 15. His CRP is pending. His sedimentation rate and CRP was checked twice after surgery and had only very mild elevations and not elevations to the point that would typically see with discitis or significant postoperative infection. However, he has severe pain subjectively and therefore clinically this could present like discitis. However, he has always had pain well out of proportion to any findings on his imaging. This was present both preoperatively and postoperatively. He did not have postoperative headaches until he developed meningitis and he had mild drainage from his wound 2-3 weeks after surgery which is fairly common after back surgery and he was put on doxycycline not because I suspected infection but because I didn't want him to get a secondary infection since he was draining somewhat. His wound never really looked that bad. His drainage did not appear to look like CSF nor did it resemble purulence at the time. His wound is now completely healed and clean. There is no swelling or evidence of infection. He still has some headache but I think this is more related to the meningitis since he does not have a pseudomeningocele at the site. I do not think further surgery is warranted at this time. I  think lumbar exploration for possible CSF leak and possible repair would carry more risks (infection, inability to repair the leak, and wound healing difficulties with possible drainage through the skin again) than potential benefits. Therefore I think he is best treated with continued pain management therapy and with antibiotics to treat infection for 6 weeks of IV antibiotics followed by 6 weeks of oral antibiotics. PICC line is already in place. I will alter his pain medications today in order to try to find a regimen that will control his pain upon discharge. His PCA will be stopped today.

## 2015-06-12 NOTE — Progress Notes (Signed)
Patient ID: Bruce Morales, male   DOB: 01/03/1976, 40 y.o.   MRN: [id number]         [facility]    Date of Admission:  06/05/2015   Total days of antibiotics 7         Principal Problem:   Staphylococcal meningitis Active Problems:   Status post lumbar laminectomy   Morbid obesity (HCC)   Headache   Sepsis (HCC)   . enoxaparin (LOVENOX) injection  40 mg Subcutaneous Q24H  . gabapentin  300 mg Oral TID  . naloxegol oxalate  25 mg Oral Daily  . oxyCODONE  20 mg Oral Q12H  . polyethylene glycol  17 g Oral BID  . senna-docusate  2 tablet Oral BID  . sodium chloride  3 mL Intravenous Q12H  . vancomycin  1,750 mg Intravenous Q12H    SUBJECTIVE: He is requesting an enema. He states that his headache and back pain are still severe.  Review of Systems: Review of Systems  Constitutional: Negative for fever, chills and diaphoresis.  Respiratory: Negative for cough, sputum production and shortness of breath.   Cardiovascular: Negative for chest pain.  Gastrointestinal: Positive for constipation. Negative for vomiting, abdominal pain and diarrhea.  Musculoskeletal: Positive for back pain.  Neurological: Positive for headaches.    Past Medical History  Diagnosis Date  . Gall stones   . Chronic lower back pain   . Anginal pain (HCC) 04/15/2014  . Chronic bronchitis (HCC)     "probably get it q yr"  . Arthritis     "wrists, knees, lower back" (04/16/2014)  . Chronic lower back pain     Social History  Substance Use Topics  . Smoking status: Former Smoker -- 0.50 packs/day for 27 years    Types: Cigarettes    Quit date: 03/11/2015  . Smokeless tobacco: Former NeurosurgeonUser  . Alcohol Use: 0.0 oz/week    0 Standard drinks or equivalent per week     Comment: 04/16/2014 "a 6 pack will last me a month"    Family History  Problem Relation Age of Onset  . Multiple sclerosis Mother   . Heart failure Maternal Grandfather   . Diabetes Father   .  Fibromyalgia Sister   . Schizophrenia Brother   . Stroke Maternal Grandmother   . Diabetes Paternal Grandmother    Allergies  Allergen Reactions  . Hydrocodone Hives and Itching  . Oxycodone     Mild Itching, patient can tolerate oxycodone  . Percocet [Oxycodone-Acetaminophen]     Itching     OBJECTIVE: Filed Vitals:   06/12/15 0550 06/12/15 0745 06/12/15 0940 06/12/15 1422  BP: 111/68  134/67 129/65  Pulse: 82  73 75  Temp: 98.4 F (36.9 C)  98.9 F (37.2 C) 98.7 F (37.1 C)  TempSrc: Oral  Oral Oral  Resp: 14 8 20 20   Height:      Weight:      SpO2: 98% 93% 97% 93%   Body mass index is 37.17 kg/(m^2).  Physical Exam  Constitutional: He is oriented to person, place, and time.  He is frustrated and tearful.  Neck: Neck supple.  Cardiovascular: Normal rate and regular rhythm.   No murmur heard. Pulmonary/Chest: Breath sounds normal.  Abdominal: Soft. There is no tenderness.  Neurological: He is alert and oriented to person, place, and time.  Skin: No rash noted.    Lab Results Lab Results  Component Value Date   WBC 9.3 06/12/2015  HGB 11.7* 06/12/2015   HCT 35.4* 06/12/2015   MCV 89.4 06/12/2015   PLT 164 06/12/2015    Lab Results  Component Value Date   CREATININE 0.87 06/12/2015   BUN 11 06/12/2015   NA 141 06/12/2015   K 3.5 06/12/2015   CL 99* 06/12/2015   CO2 35* 06/12/2015    Lab Results  Component Value Date   ALT 24 06/05/2015   AST 19 06/05/2015   ALKPHOS 59 06/05/2015   BILITOT 1.6* 06/05/2015     Microbiology: Recent Results (from the past 240 hour(s))  CSF culture     Status: None   Collection Time: 06/05/15 10:48 AM  Result Value Ref Range Status   Specimen Description CSF  Final   Special Requests NONE  Final   Gram Stain   Final    CYTOSPIN WBC PRESENT,BOTH PMN AND MONONUCLEAR NO ORGANISMS SEEN Gram Stain Report Called to,Read Back By and Verified With: DR KNAPP AT 1212 ON 1.5.17 BYSHUEA    Culture   Final    RARE  STAPHYLOCOCCUS SPECIES (COAGULASE NEGATIVE) CRITICAL RESULT CALLED TO, READ BACK BY AND VERIFIED WITH: DR Izola Price 06/07/15 @ 0847 M VESTAL Performed at [facility]    Report Status 06/09/2015 FINAL  Final   Organism ID, Bacteria STAPHYLOCOCCUS SPECIES (COAGULASE NEGATIVE)  Final      Susceptibility   Staphylococcus species (coagulase negative) - MIC*    CIPROFLOXACIN <=0.5 SENSITIVE Sensitive     ERYTHROMYCIN >=8 RESISTANT Resistant     GENTAMICIN <=0.5 SENSITIVE Sensitive     OXACILLIN >=4 RESISTANT Resistant     TETRACYCLINE <=1 SENSITIVE Sensitive     VANCOMYCIN 2 SENSITIVE Sensitive     TRIMETH/SULFA <=10 SENSITIVE Sensitive     CLINDAMYCIN <=0.25 SENSITIVE Sensitive     RIFAMPIN <=0.5 SENSITIVE Sensitive     Inducible Clindamycin NEGATIVE Sensitive     * RARE STAPHYLOCOCCUS SPECIES (COAGULASE NEGATIVE)  Culture, blood (routine x 2)     Status: None   Collection Time: 06/05/15  8:10 PM  Result Value Ref Range Status   Specimen Description BLOOD LEFT HAND  Final   Special Requests BOTTLES DRAWN AEROBIC ONLY 5CC  Final   Culture   Final    NO GROWTH 5 DAYS Performed at [facility]    Report Status 06/10/2015 FINAL  Final  Culture, blood (routine x 2)     Status: None   Collection Time: 06/05/15  8:15 PM  Result Value Ref Range Status   Specimen Description BLOOD LEFT HAND  Final   Special Requests IN PEDIATRIC BOTTLE 3CC  Final   Culture   Final    NO GROWTH 5 DAYS Performed at [facility]    Report Status 06/10/2015 FINAL  Final  Culture, blood (routine x 2)     Status: None (Preliminary result)   Collection Time: 06/09/15  1:15 PM  Result Value Ref Range Status   Specimen Description BLOOD RIGHT ARM  Final   Special Requests BOTTLES DRAWN AEROBIC AND ANAEROBIC  10CC  Final   Culture   Final    NO GROWTH 3 DAYS Performed at [facility]    Report Status PENDING  Incomplete  Culture, blood (routine x 2)     Status: None (Preliminary  result)   Collection Time: 06/09/15  1:20 PM  Result Value Ref Range Status   Specimen Description BLOOD RIGHT HAND  Final   Special Requests IN PEDIATRIC BOTTLE  [facility]  Final   Culture   Final    NO GROWTH 3 DAYS Performed at [facility]    Report Status PENDING  Incomplete     ASSESSMENT: He is improving slowly on therapy for methicillin-resistant coagulase-negative staph meningitis complicating postoperative lumbar infection.  PLAN: 1. Continue IV vancomycin for at least 5 more weeks through 07/17/2015 2. I will sign off now and arrange follow-up in my clinic within 2-3 weeks  Cliffton Asters, MD [facility] for Infectious Disease [facility] [phone] pager   [phone] cell 06/12/2015, 4:02 PM

## 2015-06-12 NOTE — Progress Notes (Signed)
Patient ID: Bruce Morales, male   DOB: [birth year], 40 y.o.   MRN: [id number]  [facility] PROGRESS NOTE  GILLERMO POCH NGE:[id number] DOB: 01/03/1976 DOA: 06/05/2015 PCP: [name], DO   Brief narrative:   Bruce Morales is a 40 y.o. male who underwent L5-S1 right hemilaminectomy on 04/17/2015. He had wound drainage for several weeks postop, treated with empiric doxycycline from 04/29/2015. Eventually the wound drainage stopped but he had progressively worsening low back pain, bilateral hip pain and right leg pain. 5days prior to admission,  he developed progressively severe and unrelenting headache, followed by fever and chills eventually leading to his admission on 06/05/2015.  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 Seen by NSG Dr.Jones in consultation, transferred to [facility] 1/9 for MRI Ls spine under sedation ID Dr.Campbell consulted too, MRI still pending   Assessment/Plan:    Principal Problem:  Bacterial meningitis - Has been on IV vancomycin, Acyclovir stopped 1/8, Rocephin stopped 06-15-15 - CSF with Coag negative staph - pt clinically improving,  - ID and neurosurgery both following.  I had detailed discussions with Dr. Ronnald Ramp neurosurgeon on 06/12/2015, according to Dr. Ronnald Ramp he has been suspicious that patient has been some narcotic seeking behavior, his clinical presentation was always out of proportion of his physical and radiological findings. This morning when I went to see the patient he was very dramatic, very verbal with the staff, demanding IV narcotics and PCA to be continued while appearing to be in no distress whatsoever. Also was called by Education officer, museum who received a phone call from patient's workmen's compensation program who cited similar concerns.  Updated the patient in detail that his PCA has been stopped by Dr. Ronnald Ramp and that he will be transitioned to oral regimen and  prepare for discharge tomorrow. He already has a PICC line. IV antibiotics will be ordered with home RN.     Severe low back pain Acute on chronic, as above. Has been operated in the past by Dr. Ronnald Ramp. Further management of this problem as above. No further surgical intervention per Dr. Ronnald Ramp at this time.   Sepsis ([place]) secondary to bacterial meningitis - pt met criteria for sepsis, now resolved  - ABX as noted above    Thrombocytopenia - reactive, resolved     Constipation -BM yesterday -due to narcotics, continue senokot and miralax   DVT prophylaxis - Lovenox SQ    Code Status: Full.  Family Communication:  plan of care discussed with the patient  Disposition Plan: Home pending above workup  IV access:  R Arm PICC  Procedures and diagnostic studies:    Dg Fluoro Guide Lumbar Puncture 06/05/2015  Successful lumbar puncture. The opening pressure could not be obtained. 15.5 cc of CSF were removed and submitted to the laboratory   Medical Consultants:  Dr.Myers d/w Neurology over the phone  Dr.Campbell ID Dr.[email or web address]   Anti-infectives    Start     Dose/Rate Route Frequency Ordered Stop   06/12/15 0000  vancomycin 1,750 mg in sodium chloride 0.9 % 500 mL     1,750 mg 250 mL/hr over 120 Minutes Intravenous Every 12 hours 06/12/15 0955     06/11/15 1400  vancomycin (VANCOCIN) 1,750 mg in sodium chloride 0.9 % 500 mL IVPB     1,750 mg 250 mL/hr over 120 Minutes Intravenous Every 12 hours 06/11/15 0952     06/08/15 2030  vancomycin (VANCOCIN) 1,250 mg in sodium chloride 0.9 %  250 mL IVPB  Status:  Discontinued     1,250 mg 166.7 mL/hr over 90 Minutes Intravenous Every 8 hours 06/08/15 2002 06/11/15 0951   06/08/15 2000  vancomycin (VANCOCIN) IVPB 1000 mg/200 mL premix  Status:  Discontinued     1,000 mg 200 mL/hr over 60 Minutes Intravenous Every 8 hours 06/08/15 1309 06/08/15 2002   06/06/15 0300  vancomycin (VANCOCIN) IVPB 1000 mg/200 mL premix  Status:  Discontinued      1,000 mg 200 mL/hr over 60 Minutes Intravenous Every 8 hours 06/05/15 1758 06/08/15 1309   06/05/15 2200  cefTRIAXone (ROCEPHIN) 2 g in dextrose 5 % 50 mL IVPB  Status:  Discontinued     2 g 100 mL/hr over 30 Minutes Intravenous Every 12 hours 06/05/15 1713 06/10/15 1453   06/05/15 1800  acyclovir (ZOVIRAX) 650 mg in dextrose 5 % 100 mL IVPB  Status:  Discontinued     650 mg 113 mL/hr over 60 Minutes Intravenous Every 8 hours 06/05/15 1732 06/08/15 1409   06/05/15 1730  vancomycin (VANCOCIN) 2,000 mg in sodium chloride 0.9 % 500 mL IVPB     2,000 mg 250 mL/hr over 120 Minutes Intravenous STAT 06/05/15 1720 06/05/15 2034   06/05/15 1715  cefTRIAXone (ROCEPHIN) 2 g in dextrose 5 % 50 mL IVPB  Status:  Discontinued     2 g 100 mL/hr over 30 Minutes Intravenous Every 24 hours 06/05/15 1713 06/05/15 1718   06/05/15 1715  vancomycin (VANCOCIN) IVPB 1000 mg/200 mL premix  Status:  Discontinued     1,000 mg 200 mL/hr over 60 Minutes Intravenous  Once 06/05/15 1713 06/05/15 1720   06/05/15 1145  cefTRIAXone (ROCEPHIN) 2 g in dextrose 5 % 50 mL IVPB     2 g 100 mL/hr over 30 Minutes Intravenous  Once 06/05/15 1136 06/05/15 1654       LOS: 7 days   HPI/Subjective: Small Bm yesterday, still with low back pain, MRI still pending  Objective: Filed Vitals:   06/12/15 0500 06/12/15 0550 06/12/15 0745 06/12/15 0940  BP:  111/68  134/67  Pulse:  82  73  Temp:  98.4 F (36.9 C)  98.9 F (37.2 C)  TempSrc:  Oral  Oral  Resp:  _0 Height:      Weight: 107.684 kg (237 lb 6.4 oz)     SpO2:  98% 93% 97%    Intake/Output Summary (Last 24 hours) at 06/12/15 1259 Last data filed at 06/12/15 0940  Gross per 24 hour  Intake    120 ml  Output   2475 ml  Net  -2355 ml    Exam:   General:  AAOx3  Cardiovascular: Regular rate and rhythm, S1/S2, no murmurs, no rubs, no gallops  Respiratory: Clear to auscultation bilaterally, no wheezing, no crackles, no rhonchi  Abdomen: Soft, non  tender, non distended, bowel sounds present, no guarding Ext: no edema Neuro: moves all extremities, no localizing signs Postop L-spine scar appears stable and healing well  Data Reviewed: Basic Metabolic Panel:  Recent Labs Lab 06/06/15 0528 06/07/15 0622 06/08/15 0545 06/11/15 0530 06/12/15 0524  NA 141 143 141 142 141  K 4.3 3.7 4.1 4.1 3.5  CL 105 108 104 101 99*  CO2 _1 35*  GLUCOSE 158* 108* 145* 97 101*  BUN 18 26* _2 CREATININE 0.89 0.91 0.79 0.90 0.87  CALCIUM 8.9 8.3* 9.1 8.4* 8.3*   Liver Function Tests: No results  for input(s): AST, ALT, ALKPHOS, BILITOT, PROT, ALBUMIN in the last 168 hours. No results for input(s): LIPASE, AMYLASE in the last 168 hours. CBC:  Recent Labs Lab 06/06/15 0528 06/07/15 0622 06/08/15 0545 06/11/15 0530 06/12/15 0524  WBC 7.2 7.3 8.2 10.4 9.3  HGB 11.4* 10.0* 10.8* 11.4* 11.7*  HCT 33.9* 29.7* 31.9* 35.3* 35.4*  MCV 86.9 88.7 87.2 88.9 89.4  PLT 175 149* 189 202 164   Recent Results (from the past 240 hour(s))  CSF culture     Status: None   Collection Time: 06/05/15 10:48 AM  Result Value Ref Range Status   Specimen Description CSF  Final   Special Requests NONE  Final   Gram Stain   Final    CYTOSPIN WBC PRESENT,BOTH PMN AND MONONUCLEAR NO ORGANISMS SEEN Gram Stain Report Called to,Read Back By and Verified With: DR KNAPP AT 1212 ON 1.5.17 BYSHUEA    Culture   Final    RARE STAPHYLOCOCCUS SPECIES (COAGULASE NEGATIVE) CRITICAL RESULT CALLED TO, READ BACK BY AND VERIFIED WITH: DR Doyle Askew 06/07/15 @ 7510 M VESTAL Performed at [facility]    Report Status 06/09/2015 FINAL  Final   Organism ID, Bacteria STAPHYLOCOCCUS SPECIES (COAGULASE NEGATIVE)  Final      Susceptibility   Staphylococcus species (coagulase negative) - MIC*    CIPROFLOXACIN <=0.5 SENSITIVE Sensitive     ERYTHROMYCIN >=8 RESISTANT Resistant     GENTAMICIN <=0.5 SENSITIVE Sensitive     OXACILLIN >=4 RESISTANT Resistant      TETRACYCLINE <=1 SENSITIVE Sensitive     VANCOMYCIN 2 SENSITIVE Sensitive     TRIMETH/SULFA <=10 SENSITIVE Sensitive     CLINDAMYCIN <=0.25 SENSITIVE Sensitive     RIFAMPIN <=0.5 SENSITIVE Sensitive     Inducible Clindamycin NEGATIVE Sensitive     * RARE STAPHYLOCOCCUS SPECIES (COAGULASE NEGATIVE)  Culture, blood (routine x 2)     Status: None   Collection Time: 06/05/15  8:10 PM  Result Value Ref Range Status   Specimen Description BLOOD LEFT HAND  Final   Special Requests BOTTLES DRAWN AEROBIC ONLY 5CC  Final   Culture   Final    NO GROWTH 5 DAYS Performed at [facility]    Report Status 06/10/2015 FINAL  Final  Culture, blood (routine x 2)     Status: None   Collection Time: 06/05/15  8:15 PM  Result Value Ref Range Status   Specimen Description BLOOD LEFT HAND  Final   Special Requests IN PEDIATRIC BOTTLE 3CC  Final   Culture   Final    NO GROWTH 5 DAYS Performed at [facility]    Report Status 06/10/2015 FINAL  Final  Culture, blood (routine x 2)     Status: None (Preliminary result)   Collection Time: 06/09/15  1:15 PM  Result Value Ref Range Status   Specimen Description BLOOD RIGHT ARM  Final   Special Requests BOTTLES DRAWN AEROBIC AND ANAEROBIC  10CC  Final   Culture   Final    NO GROWTH 3 DAYS Performed at [facility]    Report Status PENDING  Incomplete  Culture, blood (routine x 2)     Status: None (Preliminary result)   Collection Time: 06/09/15  1:20 PM  Result Value Ref Range Status   Specimen Description BLOOD RIGHT HAND  Final   Special Requests IN PEDIATRIC BOTTLE  2CC  Final   Culture   Final    NO GROWTH 3 DAYS Performed at  [facility]    Report Status PENDING  Incomplete    Scheduled Meds: . acyclovir  650 mg Intravenous Q8H  . cefTRIAXone  IV  2 g Intravenous Q12H  . dexamethasone  10 mg Intravenous 4 times per day  . enoxaparin  injection  40 mg Subcutaneous Q24H  . gabapentin  300 mg Oral TID  . vancomycin   1,000 mg Intravenous Q8H   Continuous Infusions:      Signature  Lala Lund K M.D on 06/12/2015 at 1:00 PM  Between 7am to 7pm - Pager - [phone], After 7pm go to [email or web address] - password [facility]  [facility]  - Office  [phone]

## 2015-06-13 LAB — VANCOMYCIN, TROUGH: Vancomycin Tr: 15 ug/mL (ref 10.0–20.0)

## 2015-06-13 MED ORDER — DOCUSATE SODIUM 100 MG PO CAPS: 200.0000 mg | ORAL_CAPSULE | Freq: Two times a day (BID) | ORAL | Status: DC

## 2015-06-13 MED ORDER — ACETAMINOPHEN 500 MG PO TABS
1000.0000 mg | ORAL_TABLET | Freq: Four times a day (QID) | ORAL | Status: DC | PRN
  Administered 2015-06-13 – 2015-06-14 (×2): 1000 mg via ORAL
  Filled 2015-06-13 (×2): qty 2

## 2015-06-13 MED ORDER — BISACODYL 5 MG PO TBEC
10.0000 mg | DELAYED_RELEASE_TABLET | Freq: Every day | ORAL | Status: DC
Start: 2015-06-13 — End: 2015-06-13

## 2015-06-13 NOTE — Progress Notes (Addendum)
Pharmacy Antibiotic Follow-up Note  Bruce Morales is a 40 y.o. year-old male admitted on 06/05/2015.  The patient is currently on day 9 of Vancomycin for meningitis. Per ID, pt will require 6 weeks of vancomycin therapy through 07/17/15.   Assessment/Plan: - Continue vancomycin to 1750mg  IV Q12H to facilitate outpatient administration - will check a trough this afternoon if the patient is still here. If discharged prior to trough, would recommend a trough as soon as possible - F/u renal fxn, C&S, clinical status and trough as needed  Temp (24hrs), Avg:99.3 F (37.4 C), Min:98.1 F (36.7 C), Max:102.7 F (39.3 C)   Recent Labs Lab 06/07/15 0622 06/08/15 0545 06/11/15 0530 06/12/15 0524  WBC 7.3 8.2 10.4 9.3     Recent Labs Lab 06/07/15 0622 06/08/15 0545 06/11/15 0530 06/12/15 0524  CREATININE 0.91 0.79 0.90 0.87   Estimated Creatinine Clearance: 136 mL/min (by C-G formula based on Cr of 0.87).    Allergies  Allergen Reactions  . Hydrocodone Hives and Itching  . Oxycodone     Mild Itching, patient can tolerate oxycodone  . Percocet [Oxycodone-Acetaminophen]     Itching    Antimicrobials this admission: 1/5 >> Ceftriaxone >>1/10 1/5 >> Vancomycin >> 1/5 >> Acyclovir >> 1/8  Levels/dose changes this admission: 1/6 at 1830 = 20 mcg/ml on Vanc 1gm IV q8h- no change 1/8 at 18:45 = 12 mcg/ml on Vanc 1gm q8 1/10 at 1325 = 5718mcg/ml on 1250mg  IV Q8H 1/11 Dose changed to 1750mg  IV Q12H for outpatient ease of administration  Microbiology results: 1/5 BCx: ngtd 1/5 CSF culture: MR CoNS  1/9 BCx: NGTD  Thank you for allowing pharmacy to be a part of this patient's care.  Maguire Sime, Drake LeachRachel Lynn PharmD 06/13/2015 9:47 AM   Addendum: A vancomycin trough today is therapeutic at 15 today.   Plan: - Continue vancomycin 1750mg  IV Q12H - F/u renal fxn, clinical status and at least a weekly trough but would recommend early next week  Lysle Pearlachel Ashanti Littles, PharmD,  BCPS Pager # [phone] 06/13/2015 3:02 PM

## 2015-06-13 NOTE — Progress Notes (Signed)
CM left a voicemail for Alona BeneStacy Barfield RN Case Manager with [employer] [phone]#[phone] to provide a clinical update. Patient was febrile last night and, per attending MD, will not be discharging today.  Awaiting return call.   Elmer Balesourtney Adeeb Konecny RN, MSN [phone]

## 2015-06-13 NOTE — Progress Notes (Signed)
Patient ID: Bruce Morales, male   DOB: 1976-05-31, 40 y.o.   MRN: [id number] Subjective: Patient reports continued pain and headache.  Objective: Vital signs in last 24 hours: Temp:  [98.1 F (36.7 C)-102.7 F (39.3 C)] 98.6 F (37 C) (01/13 0631) Pulse Rate:  [75-90] 78 (01/13 0631) Resp:  [20] 20 (01/13 0631) BP: (128-141)/(65-78) 134/77 mmHg (01/13 0631) SpO2:  [93 %-97 %] 96 % (01/13 0631)  Intake/Output from previous day: 01/12 0701 - 01/13 0700 In: 240 [P.O.:240] Out: 1750 [Urine:1750] Intake/Output this shift:    Neurologic: Grossly normal, wound well healed without selling or signs of infection or tenderness. Normal strength  Lab Results: Lab Results  Component Value Date   WBC 9.3 06/12/2015   HGB 11.7* 06/12/2015   HCT 35.4* 06/12/2015   MCV 89.4 06/12/2015   PLT 164 06/12/2015   Lab Results  Component Value Date   INR 1.17 06/05/2015   BMET Lab Results  Component Value Date   NA 141 06/12/2015   K 3.5 06/12/2015   CL 99* 06/12/2015   CO2 35* 06/12/2015   GLUCOSE 101* 06/12/2015   BUN 11 06/12/2015   CREATININE 0.87 06/12/2015   CALCIUM 8.3* 06/12/2015    Studies/Results: Mr Lumbar Spine W Wo Contrast  06/11/2015  CLINICAL DATA:  Progressive low back pain and bilateral hip pain and right leg pain since right hemilaminectomy at L5-S1 on 04/17/2015. Now has bacterial meningitis. EXAM: MRI LUMBAR SPINE WITHOUT AND WITH CONTRAST TECHNIQUE: Multiplanar and multiecho pulse sequences of the lumbar spine were obtained without and with intravenous contrast. CONTRAST:  20mL MULTIHANCE GADOBENATE DIMEGLUMINE 529 MG/ML IV SOLN COMPARISON:  MRIs dated 05/21/2015 and 02/13/2015 FINDINGS: L5-S1: The patient has developed a subdural fluid collection along the posterior aspect of the thecal sac is deviates the nerve roots anteriorly within the thecal sac from the level of the tip of the conus through the S1 segment. This is not felt to be significant and is possibly  iatrogenic from the recent lumbar puncture. There is a tiny defect in the thecal sac at the site of the laminectomy visible only on image 35 of series 11 with a tiny amount of fluid extending into the laminectomy defect. There is no pseudomeningocele. No fluid collections in the soft tissues posterior to that site. There is meningeal enhancement and dural enhancement extending along the posterior aspect of the thecal sac throughout the lumbar spine. The remainder of the lumbar spine appears normal. There is no appreciable osteomyelitis or discitis. Normal postsurgical changes at L5-S1 with less edema and enhancement in the vertebral endplates than on the prior study of 05/21/2015. IMPRESSION: 1. Small defect in the thecal sac at L5-S1 on the right without a pseudomeningocele. 2. Enhancement of the meninges and dura posteriorly consistent with the clinical diagnosis of meningitis. 3. Subdural fluid collection deviates the nerve roots in the thecal sac anteriorly as described above. 4. No evidence of discitis or osteomyelitis or abscess. Critical Value/emergent results were called by telephone at the time of interpretation on 06/11/2015 at 3:50 pm to Dr. Marikay Alaravid Amazing Cowman, who verbally acknowledged these results. Electronically Signed   By: Francene BoyersJames  Maxwell M.D.   On: 06/11/2015 15:55    Assessment/Plan: Continue abx per ID, one episode of fever yesterday.  He looks really good walking around the room, but once he sees us enter he appears to be in much more pain and even acts as if he has some rigors. Are they real?  Again, his pain  appears way out of proportion to the findings on his imaging, and his sed and crp are completely normal. I mentioned changing his po dilaudid to fioricet to help the headaches, but he is very resistant to changing the dilaudid. I explained he is now on oxycontin, and he said he'd rather get rid of that and take MORE dilaudid. I am concerned obviously about drug seeking behavior, and yet given  his complications I feel compelled to trust his actions and try to help. The hospitalist appears to have the same concerns I have, and has witnessed this same behavior. In fact, he approached me about it. I feel at a loss about what to do to help him control his subjective pain. Objectively he doesn't look bad, his MRI really doesn't look bad, and his labs have normalized suggesting the infection has been controlled, and I fully realize his behavior and pain intolerance even prior to intervention, but he is now on what should be more than enough medication and he is constantly begging for MORE and MORE OFTEN. I feel this would dangerous. I certainly appreciate the help I have received from the hospitalists   LOS: 8 days    Bruce Morales,Bruce Morales 06/13/2015, 9:46 AM

## 2015-06-13 NOTE — Progress Notes (Signed)
Patient's mother wanted to be added as emergency contact for patient. Patient was okay with that. Patient's mother also wanted to speak with all the physicians that are attending to patient. Physicians paged and notified.

## 2015-06-13 NOTE — Care Management Note (Signed)
Case Management Note  Patient Details  Name: Bruce Morales MRN: [id number] Date of Birth: [birth year]  Subjective/Objective:                    Action/Plan: CM [facility] from [facility] stating that they had received a referral from [facility] to provide [facility].  This information was verified with Ms Ruffy at [facility].  P[facility] has [facility] the referral for potential discharge home tomorrow.  Patient has been febrile and will need to remain afebrile prior to discharge home.  CM provided updates to patient's mother, per his request.  Expected Discharge Date:   (UNKNOWN)               Expected Discharge Plan:  Home w Home Health Services  In-House Referral:     Discharge planning Services  CM Consult  Post Acute Care Choice:    Choice offered to:     DME Arranged:    DME Agency:     HH Arranged:  IV Antibiotics, RN (to be arranged through worker's comp) HH Agency:     Status of Service:  Completed, signed off  Medicare Important Message Given:    Date Medicare IM Given:    Medicare IM give by:    Date Additional Medicare IM Given:    Additional Medicare Important Message give by:     If discussed at Long Length of Stay Meetings, dates discussed:    Additional Comments:  Anda KraftRobarge, Crimson Dubberly C, RN 06/13/2015, 3:36 PM [phone]

## 2015-06-13 NOTE — Progress Notes (Signed)
Patient ID: ADRIENE PADULA, male   DOB: 1975-12-09, 40 y.o.   MRN: [id number]  [facility] PROGRESS NOTE  CONO GEBHARD ATF:[id number] DOB: 06/22/1975 DOA: 06/05/2015 PCP: [name], DO   Brief narrative:   KANAN SOBEK is a 40 y.o. male who underwent L5-S1 right hemilaminectomy on 04/17/2015. He had wound drainage for several weeks postop, treated with empiric doxycycline from 04/29/2015. Eventually the wound drainage stopped but he had progressively worsening low back pain, bilateral hip pain and right leg pain. 5days prior to admission,  he developed progressively severe and unrelenting headache, followed by fever and chills eventually leading to his admission on 06/05/2015.  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 Seen by NSG Dr.Jones in consultation, transferred to [facility] 1/9 for MRI Ls spine under sedation ID Dr.Campbell consulted too, MRI noted.   Assessment/Plan:    Principal Problem:  Bacterial meningitis - Has been on IV vancomycin, Acyclovir stopped 1/8, Rocephin stopped 06-15-15 - CSF with Coag negative staph - pt clinically improving,  - ID and neurosurgery both following.  I had detailed discussions with Dr. Ronnald Ramp neurosurgeon on 06/12/2015, according to Dr. Ronnald Ramp he has been suspicious that patient has been some narcotic seeking behavior, his clinical presentation was always out of proportion of his physical and radiological findings. This morning when I went to see the patient he was very dramatic, very verbal with the staff, demanding IV narcotics and PCA to be continued while appearing to be in no distress whatsoever. Also was called by Education officer, museum who received a phone call from patient's workmen's compensation program who cited similar concerns.  Updated the patient in detail that his PCA has been stopped by Dr. Ronnald Ramp and that he will be transitioned to oral regimen and  prepare for discharge tomorrow. He already has a PICC line. IV antibiotics will be ordered with home RN.  Unfortunately patient ran a temperature of 102.7 night of 06/12/2015, discussed with Dr. Megan Salon ID and Dr. Ronnald Ramp again on 06/13/2015, repeat blood cultures. Monitor for 24-48 hours. He should've appears nontoxic and in no distress.     Severe low back pain acute on chronic. Has been operated in the past by Dr. Ronnald Ramp. Further management of this problem as above. No further surgical intervention per Dr. Ronnald Ramp at this time.   Sepsis (Cecilia) secondary to bacterial meningitis - pt met criteria for sepsis, now resolved,  ABX as noted above    Thrombocytopenia - reactive, resolved     Constipation -Counseled to minimize narcotics, continue bowel regimen   DVT prophylaxis - Lovenox SQ    Code Status: Full.  Family Communication:  plan of care discussed with the patient  Disposition Plan: Home pending above workup  IV access:  R Arm PICC  Procedures and diagnostic studies:    Dg Fluoro Guide Lumbar Puncture 06/05/2015  Successful lumbar puncture. The opening pressure could not be obtained. 15.5 cc of CSF were removed and submitted to the laboratory   Medical Consultants:  Dr.Myers d/w Neurology over the phone  Dr.Campbell ID Dr.jones   Anti-infectives    Start     Dose/Rate Route Frequency Ordered Stop   06/12/15 0000  vancomycin 1,750 mg in sodium chloride 0.9 % 500 mL     1,750 mg 250 mL/hr over 120 Minutes Intravenous Every 12 hours 06/12/15 0955     06/11/15 1400  vancomycin (VANCOCIN) 1,750 mg in sodium chloride 0.9 % 500 mL IVPB  1,750 mg 250 mL/hr over 120 Minutes Intravenous Every 12 hours 06/11/15 0952     06/08/15 2030  vancomycin (VANCOCIN) 1,250 mg in sodium chloride 0.9 % 250 mL IVPB  Status:  Discontinued     1,250 mg 166.7 mL/hr over 90 Minutes Intravenous Every 8 hours 06/08/15 2002 06/11/15 0951   06/08/15 2000  vancomycin (VANCOCIN) IVPB 1000 mg/200 mL  premix  Status:  Discontinued     1,000 mg 200 mL/hr over 60 Minutes Intravenous Every 8 hours 06/08/15 1309 06/08/15 2002   06/06/15 0300  vancomycin (VANCOCIN) IVPB 1000 mg/200 mL premix  Status:  Discontinued     1,000 mg 200 mL/hr over 60 Minutes Intravenous Every 8 hours 06/05/15 1758 06/08/15 1309   06/05/15 2200  cefTRIAXone (ROCEPHIN) 2 g in dextrose 5 % 50 mL IVPB  Status:  Discontinued     2 g 100 mL/hr over 30 Minutes Intravenous Every 12 hours 06/05/15 1713 06/10/15 1453   06/05/15 1800  acyclovir (ZOVIRAX) 650 mg in dextrose 5 % 100 mL IVPB  Status:  Discontinued     650 mg 113 mL/hr over 60 Minutes Intravenous Every 8 hours 06/05/15 1732 06/08/15 1409   06/05/15 1730  vancomycin (VANCOCIN) 2,000 mg in sodium chloride 0.9 % 500 mL IVPB     2,000 mg 250 mL/hr over 120 Minutes Intravenous STAT 06/05/15 1720 06/05/15 2034   06/05/15 1715  cefTRIAXone (ROCEPHIN) 2 g in dextrose 5 % 50 mL IVPB  Status:  Discontinued     2 g 100 mL/hr over 30 Minutes Intravenous Every 24 hours 06/05/15 1713 06/05/15 1718   06/05/15 1715  vancomycin (VANCOCIN) IVPB 1000 mg/200 mL premix  Status:  Discontinued     1,000 mg 200 mL/hr over 60 Minutes Intravenous  Once 06/05/15 1713 06/05/15 1720   06/05/15 1145  cefTRIAXone (ROCEPHIN) 2 g in dextrose 5 % 50 mL IVPB     2 g 100 mL/hr over 30 Minutes Intravenous  Once 06/05/15 1136 06/05/15 1654       LOS: 8 days   HPI/Subjective: Small Bm yesterday, still with low back pain, MRI still pending  Objective: Filed Vitals:   06/13/15 0100 06/13/15 0231 06/13/15 0631 06/13/15 1105  BP:  128/76 134/77 144/86  Pulse:  79 78 88  Temp: 98.1 F (36.7 C) 99.1 F (37.3 C) 98.6 F (37 C) 99.8 F (37.7 C)  TempSrc:  Oral Oral Oral  Resp:  '20 20 20  ' Height:      Weight:      SpO2:  97% 96% 99%    Intake/Output Summary (Last 24 hours) at 06/13/15 1126 Last data filed at 06/13/15 9030  Gross per 24 hour  Intake    120 ml  Output   1100 ml  Net    -980 ml    Exam:   General:  AAOx3  Cardiovascular: Regular rate and rhythm, S1/S2, no murmurs, no rubs, no gallops  Respiratory: Clear to auscultation bilaterally, no wheezing, no crackles, no rhonchi  Abdomen: Soft, non tender, non distended, bowel sounds present, no guarding Ext: no edema Neuro: moves all extremities, no localizing signs Postop L-spine scar appears stable and healing well  Data Reviewed: Basic Metabolic Panel:  Recent Labs Lab 06/07/15 0622 06/08/15 0545 06/11/15 0530 06/12/15 0524  NA 143 141 142 141  K 3.7 4.1 4.1 3.5  CL 108 104 101 99*  CO2 '26 28 31 ' 35*  GLUCOSE 108* 145* 97 101*  BUN 26* 13  18 11  CREATININE 0.91 0.79 0.90 0.87  CALCIUM 8.3* 9.1 8.4* 8.3*   Liver Function Tests: No results for input(s): AST, ALT, ALKPHOS, BILITOT, PROT, ALBUMIN in the last 168 hours. No results for input(s): LIPASE, AMYLASE in the last 168 hours. CBC:  Recent Labs Lab 06/07/15 0622 06/08/15 0545 06/11/15 0530 06/12/15 0524  WBC 7.3 8.2 10.4 9.3  HGB 10.0* 10.8* 11.4* 11.7*  HCT 29.7* 31.9* 35.3* 35.4*  MCV 88.7 87.2 88.9 89.4  PLT 149* 189 202 164   Recent Results (from the past 240 hour(s))  CSF culture     Status: None   Collection Time: 06/05/15 10:48 AM  Result Value Ref Range Status   Specimen Description CSF  Final   Special Requests NONE  Final   Gram Stain   Final    CYTOSPIN WBC PRESENT,BOTH PMN AND MONONUCLEAR NO ORGANISMS SEEN Gram Stain Report Called to,Read Back By and Verified With: DR KNAPP AT 1212 ON 1.5.17 BYSHUEA    Culture   Final    RARE STAPHYLOCOCCUS SPECIES (COAGULASE NEGATIVE) CRITICAL RESULT CALLED TO, READ BACK BY AND VERIFIED WITH: DR Doyle Askew 06/07/15 @ 2094 M VESTAL Performed at [facility]    Report Status 06/09/2015 FINAL  Final   Organism ID, Bacteria STAPHYLOCOCCUS SPECIES (COAGULASE NEGATIVE)  Final      Susceptibility   Staphylococcus species (coagulase negative) - MIC*    CIPROFLOXACIN <=0.5  SENSITIVE Sensitive     ERYTHROMYCIN >=8 RESISTANT Resistant     GENTAMICIN <=0.5 SENSITIVE Sensitive     OXACILLIN >=4 RESISTANT Resistant     TETRACYCLINE <=1 SENSITIVE Sensitive     VANCOMYCIN 2 SENSITIVE Sensitive     TRIMETH/SULFA <=10 SENSITIVE Sensitive     CLINDAMYCIN <=0.25 SENSITIVE Sensitive     RIFAMPIN <=0.5 SENSITIVE Sensitive     Inducible Clindamycin NEGATIVE Sensitive     * RARE STAPHYLOCOCCUS SPECIES (COAGULASE NEGATIVE)  Culture, blood (routine x 2)     Status: None   Collection Time: 06/05/15  8:10 PM  Result Value Ref Range Status   Specimen Description BLOOD LEFT HAND  Final   Special Requests BOTTLES DRAWN AEROBIC ONLY 5CC  Final   Culture   Final    NO GROWTH 5 DAYS Performed at [facility]    Report Status 06/10/2015 FINAL  Final  Culture, blood (routine x 2)     Status: None   Collection Time: 06/05/15  8:15 PM  Result Value Ref Range Status   Specimen Description BLOOD LEFT HAND  Final   Special Requests IN PEDIATRIC BOTTLE 3CC  Final   Culture   Final    NO GROWTH 5 DAYS Performed at [facility]    Report Status 06/10/2015 FINAL  Final  Culture, blood (routine x 2)     Status: None (Preliminary result)   Collection Time: 06/09/15  1:15 PM  Result Value Ref Range Status   Specimen Description BLOOD RIGHT ARM  Final   Special Requests BOTTLES DRAWN AEROBIC AND ANAEROBIC  10CC  Final   Culture   Final    NO GROWTH 3 DAYS Performed at [facility]    Report Status PENDING  Incomplete  Culture, blood (routine x 2)     Status: None (Preliminary result)   Collection Time: 06/09/15  1:20 PM  Result Value Ref Range Status   Specimen Description BLOOD RIGHT HAND  Final   Special Requests IN PEDIATRIC BOTTLE  [facility]  Final  Culture   Final    NO GROWTH 3 DAYS Performed at [facility]    Report Status PENDING  Incomplete    Scheduled Meds: . acyclovir  650 mg Intravenous Q8H  . cefTRIAXone  IV  2 g Intravenous Q12H  .  dexamethasone  10 mg Intravenous 4 times per day  . enoxaparin  injection  40 mg Subcutaneous Q24H  . gabapentin  300 mg Oral TID  . vancomycin  1,000 mg Intravenous Q8H   Continuous Infusions:      Signature  Lala Lund K M.D on 06/13/2015 at 11:26 AM  Between 7am to 7pm - Pager - [phone], After 7pm go to [email or web address] - password [facility]  [facility]  - Office  [phone]

## 2015-06-14 DIAGNOSIS — T814XXA Infection following a procedure, initial encounter: Secondary | ICD-10-CM

## 2015-06-14 DIAGNOSIS — S31000A Unspecified open wound of lower back and pelvis without penetration into retroperitoneum, initial encounter: Secondary | ICD-10-CM

## 2015-06-14 LAB — CBC WITH DIFFERENTIAL/PLATELET
Basophils Absolute: 0 10*3/uL (ref 0.0–0.1)
Basophils Relative: 0 %
Eosinophils Absolute: 0.3 10*3/uL (ref 0.0–0.7)
Eosinophils Relative: 3 %
HCT: 34.6 % — ABNORMAL LOW (ref 39.0–52.0)
Hemoglobin: 11.4 g/dL — ABNORMAL LOW (ref 13.0–17.0)
Lymphocytes Relative: 22 %
Lymphs Abs: 2 10*3/uL (ref 0.7–4.0)
MCH: 29.5 pg (ref 26.0–34.0)
MCHC: 32.9 g/dL (ref 30.0–36.0)
MCV: 89.4 fL (ref 78.0–100.0)
Monocytes Absolute: 1 10*3/uL (ref 0.1–1.0)
Monocytes Relative: 11 %
Neutro Abs: 5.8 10*3/uL (ref 1.7–7.7)
Neutrophils Relative %: 64 %
Platelets: 154 10*3/uL (ref 150–400)
RBC: 3.87 MIL/uL — ABNORMAL LOW (ref 4.22–5.81)
RDW: 12.8 % (ref 11.5–15.5)
WBC: 9.1 10*3/uL (ref 4.0–10.5)

## 2015-06-14 LAB — BASIC METABOLIC PANEL
Anion gap: 10 (ref 5–15)
BUN: 13 mg/dL (ref 6–20)
CO2: 30 mmol/L (ref 22–32)
Calcium: 8.9 mg/dL (ref 8.9–10.3)
Chloride: 102 mmol/L (ref 101–111)
Creatinine, Ser: 1.01 mg/dL (ref 0.61–1.24)
GFR calc Af Amer: >60 mL/min (ref >60)
GFR calc non Af Amer: >60 mL/min (ref >60)
Glucose, Bld: 95 mg/dL (ref 65–99)
Potassium: 4.6 mmol/L (ref 3.5–5.1)
Sodium: 142 mmol/L (ref 135–145)

## 2015-06-14 LAB — CULTURE, BLOOD (ROUTINE X 2)
Culture: NO GROWTH
Culture: NO GROWTH

## 2015-06-14 MED ORDER — MORPHINE SULFATE (PF) 2 MG/ML IV SOLN
2.0000 mg | Freq: Once | INTRAVENOUS | Status: AC
  Administered 2015-06-14: 2 mg via INTRAVENOUS
  Filled 2015-06-14: qty 1

## 2015-06-14 NOTE — Progress Notes (Signed)
Patient ID: Bruce Morales, male   DOB: [birth year], 40 y.o.   MRN: [id number] Stable, still with lumbar pain. No weakness.

## 2015-06-14 NOTE — Progress Notes (Signed)
Patient ID: Bruce Morales, male   DOB: 02/27/1976, 40 y.o.   MRN: [id number]         [facility] for Infectious Disease    Date of Admission:  06/05/2015   Total days of antibiotics 9         Principal Problem:   Staphylococcal meningitis Active Problems:   Status post lumbar laminectomy   Morbid obesity (HCC)   Headache   Sepsis (HCC)   . enoxaparin (LOVENOX) injection  40 mg Subcutaneous Q24H  . gabapentin  300 mg Oral TID  . naloxegol oxalate  25 mg Oral QAC breakfast  . oxyCODONE  20 mg Oral Q12H  . polyethylene glycol  17 g Oral BID  . senna-docusate  2 tablet Oral BID  . sodium chloride  3 mL Intravenous Q12H  . vancomycin  1,750 mg Intravenous Q12H    SUBJECTIVE: He is still having headache but he states that he is beginning to feel a little better. He has not had any more fever in the past 24 hours. He has not had any diarrhea.  Review of Systems: Review of Systems  Constitutional: Negative for fever, chills and diaphoresis.  Respiratory: Negative for cough, sputum production and shortness of breath.   Cardiovascular: Negative for chest pain.  Gastrointestinal: Negative for vomiting, abdominal pain, diarrhea and constipation.  Musculoskeletal: Positive for back pain.  Neurological: Positive for headaches.    Past Medical History  Diagnosis Date  . Gall stones   . Chronic lower back pain   . Anginal pain (HCC) 04/15/2014  . Chronic bronchitis (HCC)     "probably get it q yr"  . Arthritis     "wrists, knees, lower back" (04/16/2014)  . Chronic lower back pain     Social History  Substance Use Topics  . Smoking status: Former Smoker -- 0.50 packs/day for 27 years    Types: Cigarettes    Quit date: 03/11/2015  . Smokeless tobacco: Former Neurosurgeon  . Alcohol Use: 0.0 oz/week    0 Standard drinks or equivalent per week     Comment: 04/16/2014 "a 6 pack will last me a month"    Family History  Problem Relation Age of Onset  . Multiple sclerosis  Mother   . Heart failure Maternal Grandfather   . Diabetes Father   . Fibromyalgia Sister   . Schizophrenia Brother   . Stroke Maternal Grandmother   . Diabetes Paternal Grandmother    Allergies  Allergen Reactions  . Hydrocodone Hives and Itching  . Oxycodone     Mild Itching, patient can tolerate oxycodone  . Percocet [Oxycodone-Acetaminophen]     Itching     OBJECTIVE: Filed Vitals:   06/14/15 0500 06/14/15 0547 06/14/15 0952 06/14/15 1352  BP:  124/73 144/84 146/69  Pulse:  82 77 85  Temp:  98.3 F (36.8 C) 98.5 F (36.9 C) 98.2 F (36.8 C)  TempSrc:  Oral Oral Oral  Resp:  18 18 18   Height:      Weight: 226 lb 6.4 oz (102.694 kg)     SpO2:  100% 96% 99%   Body mass index is 35.45 kg/(m^2).  Physical Exam  Constitutional: He is oriented to person, place, and time.  He is resting quietly in bed.  Neck: Neck supple.  Cardiovascular: Normal rate and regular rhythm.   No murmur heard. Pulmonary/Chest: Breath sounds normal.  Abdominal: Soft. There is no tenderness.  Neurological: He is alert and oriented to person, place,  and time.  Skin: No rash noted.  Right arm PICC site appears normal.    Lab Results Lab Results  Component Value Date   WBC 9.1 06/14/2015   HGB 11.4* 06/14/2015   HCT 34.6* 06/14/2015   MCV 89.4 06/14/2015   PLT 154 06/14/2015    Lab Results  Component Value Date   CREATININE 1.01 06/14/2015   BUN 13 06/14/2015   NA 142 06/14/2015   K 4.6 06/14/2015   CL 102 06/14/2015   CO2 30 06/14/2015    Lab Results  Component Value Date   ALT 24 06/05/2015   AST 19 06/05/2015   ALKPHOS 59 06/05/2015   BILITOT 1.6* 06/05/2015     Microbiology: Recent Results (from the past 240 hour(s))  CSF culture     Status: None   Collection Time: 06/05/15 10:48 AM  Result Value Ref Range Status   Specimen Description CSF  Final   Special Requests NONE  Final   Gram Stain   Final    CYTOSPIN WBC PRESENT,BOTH PMN AND MONONUCLEAR NO ORGANISMS  SEEN Gram Stain Report Called to,Read Back By and Verified With: DR KNAPP AT 1212 ON 1.5.17 BYSHUEA    Culture   Final    RARE STAPHYLOCOCCUS SPECIES (COAGULASE NEGATIVE) CRITICAL RESULT CALLED TO, READ BACK BY AND VERIFIED WITH: DR Izola PriceMYERS 06/07/15 @ 0847 M VESTAL [facility]    Report Status 06/09/2015 FINAL  Final   Organism ID, Bacteria STAPHYLOCOCCUS SPECIES (COAGULASE NEGATIVE)  Final      Susceptibility   Staphylococcus species (coagulase negative) - MIC*    CIPROFLOXACIN <=0.5 SENSITIVE Sensitive     ERYTHROMYCIN >=8 RESISTANT Resistant     GENTAMICIN <=0.5 SENSITIVE Sensitive     OXACILLIN >=4 RESISTANT Resistant     TETRACYCLINE <=1 SENSITIVE Sensitive     VANCOMYCIN 2 SENSITIVE Sensitive     TRIMETH/SULFA <=10 SENSITIVE Sensitive     CLINDAMYCIN <=0.25 SENSITIVE Sensitive     RIFAMPIN <=0.5 SENSITIVE Sensitive     Inducible Clindamycin NEGATIVE Sensitive     * RARE STAPHYLOCOCCUS SPECIES (COAGULASE NEGATIVE)  Culture, blood (routine x 2)     Status: None   Collection Time: 06/05/15  8:10 PM  Result Value Ref Range Status   Specimen Description BLOOD LEFT HAND  Final   Special Requests BOTTLES DRAWN AEROBIC ONLY 5CC  Final   Culture   Final    NO GROWTH 5 DAYS Performed at [facility] [employer] [facility]    Report Status 06/10/2015 FINAL  Final  Culture, blood (routine x 2)     Status: None   Collection Time: 06/05/15  8:15 PM  Result Value Ref Range Status   Specimen Description BLOOD LEFT HAND  Final   Special Requests IN PEDIATRIC BOTTLE 3CC  Final   Culture   Final    NO GROWTH 5 DAYS Performed at [facility]    Report Status 06/10/2015 FINAL  Final  Culture, blood (routine x 2)     Status: None   Collection Time: 06/09/15  1:15 PM  Result Value Ref Range Status   Specimen Description BLOOD RIGHT ARM  Final   Special Requests BOTTLES DRAWN AEROBIC AND ANAEROBIC  10CC  Final   Culture   Final    NO GROWTH 5 DAYS Performed at [facility]    Report Status 06/14/2015 FINAL  Final  Culture, blood (routine x 2)     Status: None   Collection Time:  06/09/15  1:20 PM  Result Value Ref Range Status   Specimen Description BLOOD RIGHT HAND  Final   Special Requests IN PEDIATRIC BOTTLE  2CC  Final   Culture   Final    NO GROWTH 5 DAYS Performed at [facility]    Report Status 06/14/2015 FINAL  Final  Culture, blood (routine x 2)     Status: None (Preliminary result)   Collection Time: 06/13/15  1:35 PM  Result Value Ref Range Status   Specimen Description BLOOD LEFT HAND  Final   Special Requests BOTTLES DRAWN AEROBIC AND ANAEROBIC 10CCS  Final   Culture NO GROWTH 1 DAY  Final   Report Status PENDING  Incomplete  Culture, blood (routine x 2)     Status: None (Preliminary result)   Collection Time: 06/13/15  1:40 PM  Result Value Ref Range Status   Specimen Description BLOOD LEFT ANTECUBITAL  Final   Special Requests BOTTLES DRAWN AEROBIC AND ANAEROBIC 10CCS  Final   Culture NO GROWTH 1 DAY  Final   Report Status PENDING  Incomplete     ASSESSMENT: I suspect his recent fevers were due to his methicillin-resistant coagulase-negative staph lumbar wound infection and meningitis. He seems to be improving slowly. If he has no more fever or other complications he should be ready for discharge tomorrow.  PLAN: 1. Continue IV vancomycin for at least 5 more weeks through 07/17/2015  Cliffton Asters, MD [facility] [phone] pager   [phone] cell 06/14/2015, 5:03 PM

## 2015-06-14 NOTE — Progress Notes (Signed)
Patient ID: Bruce Morales, male   DOB: 1975/09/21, 40 y.o.   MRN: [id number]  [facility] PROGRESS NOTE  Bruce Morales CMK:[id number] DOB: 1976-01-18 DOA: 06/05/2015 PCP: [name], DO   Brief narrative:   Bruce Morales is a 40 y.o. male who underwent L5-S1 right hemilaminectomy on 04/17/2015. He had wound drainage for several weeks postop, treated with empiric doxycycline from 04/29/2015. Eventually the wound drainage stopped but he had progressively worsening low back pain, bilateral hip pain and right leg pain. 5days prior to admission,  he developed progressively severe and unrelenting headache, followed by fever and chills eventually leading to his admission on 06/05/2015.  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 Seen by NSG Dr.Jones in consultation, transferred to [facility] 1/9 for MRI Ls spine under sedation ID Dr.Campbell consulted too, MRI noted.   Assessment/Plan:    Principal Problem:   Bacterial meningitis - Has been on IV vancomycin, Acyclovir stopped 1/8, Rocephin stopped 06-15-15 - CSF with Coag negative staph - pt clinically improving,  - ID and neurosurgery both following.  I had detailed discussions with Dr. Ronnald Ramp neurosurgeon on 06/12/2015, according to Dr. Ronnald Ramp he has been suspicious that patient has been some narcotic seeking behavior, his clinical presentation was always out of proportion of his physical and radiological findings. This morning when I went to see the patient he was very dramatic, very verbal with the staff, demanding IV narcotics and PCA to be continued while appearing to be in no distress whatsoever. Also was called by Education officer, museum who received a phone call from patient's workmen's compensation program who cited similar concerns.  Updated the patient in detail that his PCA has been stopped by Dr. Ronnald Ramp and that he will be transitioned to oral regimen and  prepare for discharge tomorrow. He already has a PICC line. IV antibiotics will be ordered with home RN.  Unfortunately patient ran a temperature of 102.7 night of 06/12/2015, discussed with Dr. Megan Salon ID and Dr. Ronnald Ramp again on 06/13/2015, repeat blood cultures. Monitor for 24-48 hours. He should've appears nontoxic and in no distress. ID will reevaluate him on 06/14/2015. CBC with differential is essentially unremarkable. ESR is 15 and CRP is 0.6. Appears nontoxic.     Severe low back pain acute on chronic. Has been operated in the past by Dr. Ronnald Ramp. Further management of this problem as above. No further surgical intervention per Dr. Ronnald Ramp at this time.   Sepsis ([place]) secondary to bacterial meningitis - pt met criteria for sepsis, now resolved,  ABX as noted above    Thrombocytopenia - reactive, resolved     Constipation -Counseled to minimize narcotics, continue bowel regimen   DVT prophylaxis - Lovenox SQ    Code Status: Full.  Family Communication:  plan of care discussed with the patient  Disposition Plan: Home pending above workup  IV access:  R Arm PICC  Procedures and diagnostic studies:    Dg Fluoro Guide Lumbar Puncture 06/05/2015  Successful lumbar puncture. The opening pressure could not be obtained. 15.5 cc of CSF were removed and submitted to the laboratory   Medical Consultants:  Dr.Myers d/w Neurology over the phone  Dr.Campbell ID Dr.[email or web address]   Anti-infectives    Start     Dose/Rate Route Frequency Ordered Stop   06/12/15 0000  vancomycin 1,750 mg in sodium chloride 0.9 % 500 mL     1,750 mg 250 mL/hr over 120 Minutes Intravenous Every 12 hours 06/12/15  5329     06/11/15 1400  vancomycin (VANCOCIN) 1,750 mg in sodium chloride 0.9 % 500 mL IVPB     1,750 mg 250 mL/hr over 120 Minutes Intravenous Every 12 hours 06/11/15 0952     06/08/15 2030  vancomycin (VANCOCIN) 1,250 mg in sodium chloride 0.9 % 250 mL IVPB  Status:  Discontinued     1,250 mg 166.7  mL/hr over 90 Minutes Intravenous Every 8 hours 06/08/15 2002 06/11/15 0951   06/08/15 2000  vancomycin (VANCOCIN) IVPB 1000 mg/200 mL premix  Status:  Discontinued     1,000 mg 200 mL/hr over 60 Minutes Intravenous Every 8 hours 06/08/15 1309 06/08/15 2002   06/06/15 0300  vancomycin (VANCOCIN) IVPB 1000 mg/200 mL premix  Status:  Discontinued     1,000 mg 200 mL/hr over 60 Minutes Intravenous Every 8 hours 06/05/15 1758 06/08/15 1309   06/05/15 2200  cefTRIAXone (ROCEPHIN) 2 g in dextrose 5 % 50 mL IVPB  Status:  Discontinued     2 g 100 mL/hr over 30 Minutes Intravenous Every 12 hours 06/05/15 1713 06/10/15 1453   06/05/15 1800  acyclovir (ZOVIRAX) 650 mg in dextrose 5 % 100 mL IVPB  Status:  Discontinued     650 mg 113 mL/hr over 60 Minutes Intravenous Every 8 hours 06/05/15 1732 06/08/15 1409   06/05/15 1730  vancomycin (VANCOCIN) 2,000 mg in sodium chloride 0.9 % 500 mL IVPB     2,000 mg 250 mL/hr over 120 Minutes Intravenous STAT 06/05/15 1720 06/05/15 2034   06/05/15 1715  cefTRIAXone (ROCEPHIN) 2 g in dextrose 5 % 50 mL IVPB  Status:  Discontinued     2 g 100 mL/hr over 30 Minutes Intravenous Every 24 hours 06/05/15 1713 06/05/15 1718   06/05/15 1715  vancomycin (VANCOCIN) IVPB 1000 mg/200 mL premix  Status:  Discontinued     1,000 mg 200 mL/hr over 60 Minutes Intravenous  Once 06/05/15 1713 06/05/15 1720   06/05/15 1145  cefTRIAXone (ROCEPHIN) 2 g in dextrose 5 % 50 mL IVPB     2 g 100 mL/hr over 30 Minutes Intravenous  Once 06/05/15 1136 06/05/15 1654       LOS: 9 days   HPI/Subjective: Small Bm yesterday, still with low back pain, MRI still pending  Objective: Filed Vitals:   06/14/15 0125 06/14/15 0500 06/14/15 0547 06/14/15 0952  BP: 144/96  124/73 144/84  Pulse: 99  82 77  Temp: 98.8 F (37.1 C)  98.3 F (36.8 C) 98.5 F (36.9 C)  TempSrc: Oral  Oral Oral  Resp: _0 Height:      Weight:  102.694 kg (226 lb 6.4 oz)    SpO2: 100%  100% 96%     Intake/Output Summary (Last 24 hours) at 06/14/15 1034 Last data filed at 06/14/15 1000  Gross per 24 hour  Intake      0 ml  Output   1350 ml  Net  -1350 ml    Exam:   General:  AAOx3  Cardiovascular: Regular rate and rhythm, S1/S2, no murmurs, no rubs, no gallops  Respiratory: Clear to auscultation bilaterally, no wheezing, no crackles, no rhonchi  Abdomen: Soft, non tender, non distended, bowel sounds present, no guarding         Ext: no edema         Neuro: moves all extremities, no localizing signs         Postop L-spine scar appears stable and healing well  Data Reviewed: Basic Metabolic Panel:  Recent Labs Lab 06/08/15 0545 06/11/15 0530 06/12/15 0524 06/14/15 0455  NA 141 142 141 142  K 4.1 4.1 3.5 4.6  CL 104 101 99* 102  CO2 28 31 35* 30  GLUCOSE 145* 97 101* 95  BUN _0 CREATININE 0.79 0.90 0.87 1.01  CALCIUM 9.1 8.4* 8.3* 8.9   Liver Function Tests: No results for input(s): AST, ALT, ALKPHOS, BILITOT, PROT, ALBUMIN in the last 168 hours. No results for input(s): LIPASE, AMYLASE in the last 168 hours. CBC:  Recent Labs Lab 06/08/15 0545 06/11/15 0530 06/12/15 0524 06/14/15 0455  WBC 8.2 10.4 9.3 9.1  NEUTROABS  --   --   --  5.8  HGB 10.8* 11.4* 11.7* 11.4*  HCT 31.9* 35.3* 35.4* 34.6*  MCV 87.2 88.9 89.4 89.4  PLT 189 202 164 154   Recent Results (from the past 240 hour(s))  CSF culture     Status: None   Collection Time: 06/05/15 10:48 AM  Result Value Ref Range Status   Specimen Description CSF  Final   Special Requests NONE  Final   Gram Stain   Final    CYTOSPIN WBC PRESENT,BOTH PMN AND MONONUCLEAR NO ORGANISMS SEEN Gram Stain Report Called to,Read Back By and Verified With: DR KNAPP AT 1212 ON 1.5.17 BYSHUEA    Culture   Final    RARE STAPHYLOCOCCUS SPECIES (COAGULASE NEGATIVE) CRITICAL RESULT CALLED TO, READ BACK BY AND VERIFIED WITH: DR Doyle Askew 06/07/15 @ 6237 M VESTAL Performed at [facility]    Report  Status 06/09/2015 FINAL  Final   Organism ID, Bacteria STAPHYLOCOCCUS SPECIES (COAGULASE NEGATIVE)  Final      Susceptibility   Staphylococcus species (coagulase negative) - MIC*    CIPROFLOXACIN <=0.5 SENSITIVE Sensitive     ERYTHROMYCIN >=8 RESISTANT Resistant     GENTAMICIN <=0.5 SENSITIVE Sensitive     OXACILLIN >=4 RESISTANT Resistant     TETRACYCLINE <=1 SENSITIVE Sensitive     VANCOMYCIN 2 SENSITIVE Sensitive     TRIMETH/SULFA <=10 SENSITIVE Sensitive     CLINDAMYCIN <=0.25 SENSITIVE Sensitive     RIFAMPIN <=0.5 SENSITIVE Sensitive     Inducible Clindamycin NEGATIVE Sensitive     * RARE STAPHYLOCOCCUS SPECIES (COAGULASE NEGATIVE)  Culture, blood (routine x 2)     Status: None   Collection Time: 06/05/15  8:10 PM  Result Value Ref Range Status   Specimen Description BLOOD LEFT HAND  Final   Special Requests BOTTLES DRAWN AEROBIC ONLY 5CC  Final   Culture   Final    NO GROWTH 5 DAYS Performed at [facility]    Report Status 06/10/2015 FINAL  Final  Culture, blood (routine x 2)     Status: None   Collection Time: 06/05/15  8:15 PM  Result Value Ref Range Status   Specimen Description BLOOD LEFT HAND  Final   Special Requests IN PEDIATRIC BOTTLE 3CC  Final   Culture   Final    NO GROWTH 5 DAYS Performed at [facility]    Report Status 06/10/2015 FINAL  Final  Culture, blood (routine x 2)     Status: None (Preliminary result)   Collection Time: 06/09/15  1:15 PM  Result Value Ref Range Status   Specimen Description BLOOD RIGHT ARM  Final   Special Requests BOTTLES DRAWN AEROBIC AND ANAEROBIC  10CC  Final   Culture   Final    NO GROWTH 4  DAYS Performed at [facility]    Report Status PENDING  Incomplete  Culture, blood (routine x 2)     Status: None (Preliminary result)   Collection Time: 06/09/15  1:20 PM  Result Value Ref Range Status   Specimen Description BLOOD RIGHT HAND  Final   Special Requests IN PEDIATRIC BOTTLE  2CC  Final   Culture    Final    NO GROWTH 4 DAYS Performed at [facility]    Report Status PENDING  Incomplete    Scheduled Meds: . acyclovir  650 mg Intravenous Q8H  . cefTRIAXone  IV  2 g Intravenous Q12H  . dexamethasone  10 mg Intravenous 4 times per day  . enoxaparin  injection  40 mg Subcutaneous Q24H  . gabapentin  300 mg Oral TID  . vancomycin  1,000 mg Intravenous Q8H   Continuous Infusions:      Signature  Thurnell Lose M.D on 06/14/2015 at 10:34 AM  Between 7am to 7pm - Pager - [phone], After 7pm go to [email or web address] - password [facility]  [facility]  - Office  [phone]

## 2015-06-15 MED ORDER — MORPHINE SULFATE (CONCENTRATE) 10 MG/0.5ML PO SOLN: 10.0000 mg | ORAL | Status: DC | PRN

## 2015-06-15 MED ORDER — IBUPROFEN 600 MG PO TABS: 600.0000 mg | ORAL_TABLET | Freq: Three times a day (TID) | ORAL | Status: DC | PRN

## 2015-06-15 MED ORDER — MORPHINE SULFATE (PF) 2 MG/ML IV SOLN
2.0000 mg | INTRAVENOUS | Status: DC | PRN
  Administered 2015-06-15: 2 mg via INTRAVENOUS
  Filled 2015-06-15: qty 1

## 2015-06-15 MED ORDER — HEPARIN SOD (PORK) LOCK FLUSH 100 UNIT/ML IV SOLN
250.0000 [IU] | INTRAVENOUS | Status: AC | PRN
  Administered 2015-06-15: 250 [IU]

## 2015-06-15 NOTE — Progress Notes (Signed)
CM received call from RN stating pt to be discharged.  Cm called [facility] and received callback from Marylene Landngela [phone]) medication (vanc).  CM called [facility] to notify of pt discharge and last run of vanc at 14:00.  RN notified.  No other CM needs were communicated.

## 2015-06-15 NOTE — Progress Notes (Signed)
Patient ID: Bruce Morales, male   DOB: January 31, 1976, 40 y.o.   MRN: [id number] Neuro unchanged. C/o lumbar pain.

## 2015-06-15 NOTE — Progress Notes (Signed)
Tried to get patient up at 5:10 to walk pt said, "come back later, and I will."  Came back at 6:10 pt stated he is in too much pain to walk, medicated him....will try to ambulate on first shift

## 2015-06-15 NOTE — Progress Notes (Signed)
Discharge orders received.  Discharge instructions and follow-up appointments reviewed with the patient.  VSS upon discharge.  PICC line capped by IV team .  All belongings sent with the patient.  Transported out via wheelchair.  Sondra ComeSilva, Nainoa Woldt M, RN

## 2015-06-15 NOTE — Discharge Summary (Addendum)
Bruce Morales, is a 40 y.o. male  DOB 05-Apr-1976  MRN [id number].  Admission date:  06/05/2015  Admitting Physician  Theodis Blaze, MD  Discharge Date:  06/15/2015   Primary MD  LE, Cipriano Mile, DO  Recommendations for primary care physician for things to follow:   Check CBC, BMP, vancomycin levels once to twice every week. Close outpatient neurosurgery and ID follow-up.   Admission Diagnosis  Meningitis [G03.9] Fever [R50.9]   Discharge Diagnosis  Meningitis [G03.9] Fever [R50.9]    Principal Problem:   Staphylococcal meningitis Active Problems:   Morbid obesity ([place])   Headache   Sepsis ([place])   Status post lumbar laminectomy      Past Medical History  Diagnosis Date  . Gall stones   . Chronic lower back pain   . Anginal pain ([place]) 04/15/2014  . Chronic bronchitis ([place])     "probably get it q yr"  . Arthritis     "wrists, knees, lower back" (04/16/2014)  . Chronic lower back pain     Past Surgical History  Procedure Laterality Date  . Cholecystectomy  2011  . Knee arthroscopy Right 2013 X 2  . Esophagogastroduodenoscopy N/A 02/27/2013    Procedure: ESOPHAGOGASTRODUODENOSCOPY (EGD);  Surgeon: Cleotis Nipper, MD;  Location: [facility];  Service: Endoscopy;  Laterality: N/A;  . Left heart catheterization with coronary angiogram N/A 04/17/2014    Procedure: LEFT HEART CATHETERIZATION WITH CORONARY ANGIOGRAM;  Surgeon: Birdie Riddle, MD;  Location: [facility];  Service: Cardiovascular;  Laterality: N/A;  . Radiology with anesthesia N/A 06/11/2015    Procedure: MRI LUMBAR WITH AND WITHOUT CONTRAST;  Surgeon: Medication Radiologist, MD;  Location: [facility];  Service: Radiology;  Laterality: N/A;       HPI  from the history and physical done on the day of admission:    Bruce Morales is  a 39 y.o. male who underwent L5-S1 right hemilaminectomy on 04/17/2015. He had wound drainage for several weeks postop, treated with empiric doxycycline from 04/29/2015. Eventually the wound drainage stopped but he had progressively worsening low back pain, bilateral hip pain and right leg pain. 5days prior to admission, he developed progressively severe and unrelenting headache, followed by fever and chills eventually leading to his admission on 06/05/2015.  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 Seen by NSG Dr.Jones in consultation, transferred to [facility] 1[facility]9 for MRI Ls spine under sedation ID Dr.Campbell consulted too, MRI noted.     Hospital Course:     Bacterial meningitis - Has been on IV vancomycin with tentative stop date of 08/10/2015, would be finally decided by ID physician Dr. Megan Salon and neurosurgeon Dr. Ronnald Ramp - CSF with Coag negative staph - pt clinically much improved. - ID and neurosurgery both saw and followed the patient while she was here.  I had detailed discussions with Dr. Ronnald Ramp neurosurgeon on 06/12/2015, according to Dr. Ronnald Ramp he has been suspicious that patient has been some narcotic seeking behavior, his clinical  presentation was always out of proportion of his physical and radiological findings. Please order narcotics with caution.  She was to be discharged on 06/13/2015, Unfortunately patient ran a temperature of 102.7 night of 06/12/2015, discussed with Dr. Megan Salon ID and Dr. Ronnald Ramp again on 06/13/2015, repeat blood cultures. He continued to appear nontoxic and in no distress. ID reevaluated him on 06/14/2015.   CBC with differential is essentially unremarkable. ESR is 15 and CRP is 0.6. Appears nontoxic. Repeat cultures remained negative, discussed with ID physician Dr. Megan Salon on 06/15/2015. Patient to be discharged on home IV antibiotic by a PICC line thereafter follow with ID physician Dr.  Megan Salon and neurosurgeon Dr. Ronnald Ramp. Home health RN ordered. Remove PICC line once antibiotic course is finished.    Severe low back pain acute on chronic. Has been operated in the past by Dr. Ronnald Ramp. Further management of this problem as above. No further surgical intervention per Dr. Ronnald Ramp at this time.   Sepsis ([place]) secondary to bacterial meningitis - pt met criteria for sepsis, now resolved, ABX as noted above   Thrombocytopenia - reactive, resolved    Constipation -Counseled to minimize narcotics, resolved with bowel regimen   Discharge Condition: Stable  Follow UP  Follow-up Information    Follow up with LE, THAO [place], DO. Schedule an appointment as soon as possible for a visit in 1 week.   Specialty:  Family Medicine   Contact information:   [place] [address] [phone]       Follow up with Eustace [place], MD. Schedule an appointment as soon as possible for a visit in 1 week.   Specialty:  Neurosurgery   Contact information:   [address] [place] [address] [phone]       Follow up with Michel Bickers, MD. Schedule an appointment as soon as possible for a visit in 1 week.   Specialty:  Infectious Diseases   Contact information:   [address] [address] [phone]        Consults obtained -  neurosurgeon Dr. Ronnald Ramp, [place] physician Dr. Megan Salon  Diet and Activity recommendation: See Discharge Instructions below  Discharge Instructions           Discharge Instructions    Diet - low sodium heart healthy    Complete by:  As directed      Discharge instructions    Complete by:  As directed   Follow with Primary MD LE, THAO PHUONG, DO in 3-4 days   Get CBC, CMP, 2 view Chest X ray checked  by Primary MD next visit.    Activity: As tolerated with Full fall precautions use walker/cane & assistance as needed   Disposition Home     Diet:   Heart Healthy    For Heart  failure patients - Check your Weight same time everyday, if you gain over 2 pounds, or you develop in leg swelling, experience more shortness of breath or chest pain, call your Primary MD immediately. Follow Cardiac Low Salt Diet and 1.5 lit/day fluid restriction.   On your next visit with your primary care physician please Get Medicines reviewed and adjusted.   Please request your Prim.MD to go over all Hospital Tests and Procedure/Radiological results at the follow up, please get all Hospital records sent to your Prim MD by signing hospital release before you go home.   If you experience worsening of your admission symptoms, develop shortness of breath, life  threatening emergency, suicidal or homicidal thoughts you must seek medical attention immediately by calling 911 or calling your MD immediately  if symptoms less severe.  You Must read complete instructions/literature along with all the possible adverse reactions/side effects for all the Medicines you take and that have been prescribed to you. Take any new Medicines after you have completely understood and accpet all the possible adverse reactions/side effects.   Do not drive, operating heavy machinery, perform activities at heights, swimming or participation in water activities or provide baby sitting services if your were admitted for syncope or siezures until you have seen by Primary MD or a Neurologist and advised to do so again.  Do not drive when taking Pain medications.    Do not take more than prescribed Pain, Sleep and Anxiety Medications  Special Instructions: If you have smoked or chewed Tobacco  in the last 2 yrs please stop smoking, stop any regular Alcohol  and or any Recreational drug use.  Wear Seat belts while driving.   Please note  You were cared for by a hospitalist during your hospital stay. If you have any questions about your discharge medications or the care you received while you were in the hospital after  you are discharged, you can call the unit and asked to speak with the hospitalist on call if the hospitalist that took care of you is not available. Once you are discharged, your primary care physician will handle any further medical issues. Please note that NO REFILLS for any discharge medications will be authorized once you are discharged, as it is imperative that you return to your primary care physician (or establish a relationship with a primary care physician if you do not have one) for your aftercare needs so that they can reassess your need for medications and monitor your lab values.     Increase activity slowly    Complete by:  As directed              Discharge Medications       Medication List    STOP taking these medications        oxyCODONE-acetaminophen 10-325 MG tablet  Commonly known as:  PERCOCET      TAKE these medications        acetaminophen 500 MG tablet  Commonly known as:  TYLENOL  Take 1,000 mg by mouth every 6 (six) hours as needed for moderate pain or headache.     albuterol 108 (90 Base) MCG/ACT inhaler  Commonly known as:  PROVENTIL HFA;VENTOLIN HFA  Inhale 2 puffs into the lungs every 4 (four) hours as needed (cough, shortness of breath or wheezing.).     cyclobenzaprine 10 MG tablet  Commonly known as:  FLEXERIL  Take 1 tablet (10 mg total) by mouth 2 (two) times daily as needed for muscle spasms.     gabapentin 300 MG capsule  Commonly known as:  NEURONTIN  Take 1 capsule by mouth 3 (three) times daily.     HYDROmorphone 2 MG tablet  Commonly known as:  DILAUDID  Take 1 tablet (2 mg total) by mouth every 6 (six) hours as needed for severe pain.     ibuprofen 600 MG tablet  Commonly known as:  ADVIL,MOTRIN  Take 1 tablet (600 mg total) by mouth every 8 (eight) hours as needed.     lactulose 10 GM/15ML solution  Commonly known as:  CHRONULAC  Take 5-10 mLs by mouth daily as needed.  meloxicam 15 MG tablet  Commonly known as:  MOBIC    Take 7.5 mg by mouth daily as needed for pain.     methocarbamol 750 MG tablet  Commonly known as:  ROBAXIN  Take 750 mg by mouth every 8 (eight) hours as needed.     morphine CONCENTRATE 10 MG/0.5ML Soln concentrated solution  Take 0.5 mLs (10 mg total) by mouth every 3 (three) hours as needed for moderate pain or severe pain.     MOVANTIK 25 MG Tabs tablet  Generic drug:  naloxegol oxalate  take 2m daily     oxyCODONE 20 mg 12 hr tablet  Commonly known as:  OXYCONTIN  Take 1 tablet (20 mg total) by mouth every 12 (twelve) hours.     promethazine 25 MG tablet  Commonly known as:  PHENERGAN  Take 1 tablet (25 mg total) by mouth every 8 (eight) hours as needed for nausea or vomiting.     vancomycin 1,750 mg in sodium chloride 0.9 % 500 mL  Inject 1,750 mg into the vein every 12 (twelve) hours. Follow with ID in 1 month, stop date 08/10/15, Home and can draw vancomycin levels to be followed by home health pharmacy and ID office of Dr.Campbell        Major procedures and Radiology Reports - PLEASE review detailed and final reports for all details, in brief -    R Arm PICC   Ct Head Wo Contrast  06/05/2015  CLINICAL DATA:  Severe headaches for 3 days EXAM: CT HEAD WITHOUT CONTRAST TECHNIQUE: Contiguous axial images were obtained from the base of the skull through the vertex without intravenous contrast. COMPARISON:  None. FINDINGS: The bony calvarium is intact. The ventricles are of normal size and configuration. No findings to suggest acute hemorrhage, acute infarction or space-occupying mass lesion are noted. IMPRESSION: No acute intracranial abnormality noted. Electronically Signed   By: MInez CatalinaM.D.   On: 06/05/2015 07:52   Ct Lumbar Spine W Contrast  05/16/2015  CLINICAL DATA:  Initial evaluation for chronic low back pain, worsened for past 3 days. History of prior surgery. EXAM: CT LUMBAR SPINE WITH CONTRAST TECHNIQUE: Multidetector CT imaging of the lumbar spine was  performed with intravenous contrast administration. Multiplanar CT image reconstructions were also generated. CONTRAST:  1013mOMNIPAQUE IOHEXOL 300 MG/ML  SOLN COMPARISON:  Prior MRI from 10/17/2014. FINDINGS: Alignment is stable with preservation of the normal lumbar lordosis. Vertebral body heights maintained. No fracture or listhesis. Conus medullaris grossly normal, terminating at the L1 level. Postoperative changes from decompressive right hemi laminectomy present at L5. Postsurgical changes present within the soft tissues of the lower back. No focal fluid collections or overt evidence for infection. Visualized paraspinous soft tissues are otherwise unremarkable. L1-2:  Negative. L2-3:  Negative. L3-4:  Negative. L4-5: Mild degenerative annular disc bulge. No definite new focal disc herniation. No significant stenosis. L5-S1: Postoperative changes from interval decompressive right hemi laminectomy. Soft tissue density within the right lateral epidural space, which may reflect postoperative changes/granulation tissue. There is persistent degenerative disc bulge at this level. On sagittal and axial sequence, there is mildly hyperdense soft tissue density within the ventral epidural space posterior to the L5 vertebral body (series 605, image 30). Unclear whether this reflects new disc herniation/extrusion. While this does not have the appearance of frank abscess, possible infection is not entirely excluded. The central canal remains grossly patent without significant stenosis. Additionally, there is soft tissue density within the right L5-S1 neural  foramen, favored to reflect postoperative changes, although possible disc material or infection could be considered. IMPRESSION: 1. Sequela of interval decompressive right hemi laminectomy at L5-S1. There is mildly hyperdense soft tissue density within the ventral epidural space posterior to the L5 vertebral body, which may reflect new disc herniation/extrusion. While  no frank epidural abscess is identified, possible infection could be considered in the correct clinical setting. No evidence for osteomyelitis discitis within the adjacent L5-S1 disc space. No other evidence for infection about the operative site. No severe canal stenosis appreciated on this examination. Further evaluation with MRI, with and without contrast, would likely be helpful for further evaluation. 2. Mild degenerative disc bulge at L4-5 without stenosis. 3. Otherwise negative CT of the lumbar spine. Electronically Signed   By: Jeannine Boga M.D.   On: 05/16/2015 23:51   Mr Lumbar Spine W Wo Contrast  06/11/2015  CLINICAL DATA:  Progressive low back pain and bilateral hip pain and right leg pain since right hemilaminectomy at L5-S1 on 04/17/2015. Now has bacterial meningitis. EXAM: MRI LUMBAR SPINE WITHOUT AND WITH CONTRAST TECHNIQUE: Multiplanar and multiecho pulse sequences of the lumbar spine were obtained without and with intravenous contrast. CONTRAST:  35m MULTIHANCE GADOBENATE DIMEGLUMINE 529 MG/ML IV SOLN COMPARISON:  MRIs dated 05/21/2015 and 02/13/2015 FINDINGS: L5-S1: The patient has developed a subdural fluid collection along the posterior aspect of the thecal sac is deviates the nerve roots anteriorly within the thecal sac from the level of the tip of the conus through the S1 segment. This is not felt to be significant and is possibly iatrogenic from the recent lumbar puncture. There is a tiny defect in the thecal sac at the site of the laminectomy visible only on image 35 of series 11 with a tiny amount of fluid extending into the laminectomy defect. There is no pseudomeningocele. No fluid collections in the soft tissues posterior to that site. There is meningeal enhancement and dural enhancement extending along the posterior aspect of the thecal sac throughout the lumbar spine. The remainder of the lumbar spine appears normal. There is no appreciable osteomyelitis or discitis.  Normal postsurgical changes at L5-S1 with less edema and enhancement in the vertebral endplates than on the prior study of 05/21/2015. IMPRESSION: 1. Small defect in the thecal sac at L5-S1 on the right without a pseudomeningocele. 2. Enhancement of the meninges and dura posteriorly consistent with the clinical diagnosis of meningitis. 3. Subdural fluid collection deviates the nerve roots in the thecal sac anteriorly as described above. 4. No evidence of discitis or osteomyelitis or abscess. Critical Value/emergent results were called by telephone at the time of interpretation on 06/11/2015 at 3:50 pm to Dr. DSherley Bounds who verbally acknowledged these results. Electronically Signed   By: JLorriane ShireM.D.   On: 06/11/2015 15:55   Dg Chest Port 1 View  06/05/2015  CLINICAL DATA:  Sepsis protocol. Severe headache and weakness. Fever for 3 days. EXAM: PORTABLE CHEST 1 VIEW COMPARISON:  04/21/2015 FINDINGS: The heart is normal in size. The mediastinal and hilar contours are normal. Diffuse increased interstitial markings and peribronchial thickening most likely reflecting bronchitis or interstitial pneumonitis. No focal airspace consolidation or joint effusion. The bony thorax is intact. IMPRESSION: Findings suggest bronchitis or interstitial pneumonitis. No focal infiltrate or effusion. Electronically Signed   By: PMarijo SanesM.D.   On: 06/05/2015 17:53   Dg Fluoro Guide Lumbar Puncture  06/05/2015  CLINICAL DATA:  Headache, fever and neck stiffness. EXAM: DIAGNOSTIC LUMBAR PUNCTURE UNDER  FLUOROSCOPIC GUIDANCE FLUOROSCOPY TIME:  Radiation Exposure Index (as provided by the fluoroscopic device): If the device does not provide the exposure index: Fluoroscopy Time (in minutes and seconds):  2 minutes and 2 seconds Number of Acquired Images:  0 PROCEDURE: Informed consent was obtained from the patient prior to the procedure, including potential complications of headache, allergy, and pain. With the patient prone,  the lower back was prepped with Betadine. 1% Lidocaine was used for local anesthesia. Lumbar puncture was performed at the L3-4 level using a 22 gauge needle with return of 15.5 CSF with an opening pressure of could not be obtained. Cm water. 15.5 ml of CSF were obtained for laboratory studies. The patient tolerated the procedure well and there were no apparent complications. IMPRESSION: 1. Technically successful lumbar puncture. The opening pressure could not be obtained. 2. 15.5 cc of CSF were removed and submitted to the laboratory Electronically Signed   By: Kerby Moors M.D.   On: 06/05/2015 11:10    Micro Results      Recent Results (from the past 240 hour(s))  Culture, blood (routine x 2)     Status: None   Collection Time: 06/05/15  8:10 PM  Result Value Ref Range Status   Specimen Description BLOOD LEFT HAND  Final   Special Requests BOTTLES DRAWN AEROBIC ONLY 5CC  Final   Culture   Final    NO GROWTH 5 DAYS Performed at [facility]    Report Status 06/10/2015 FINAL  Final  Culture, blood (routine x 2)     Status: None   Collection Time: 06/05/15  8:15 PM  Result Value Ref Range Status   Specimen Description BLOOD LEFT HAND  Final   Special Requests IN PEDIATRIC BOTTLE 3CC  Final   Culture   Final    NO GROWTH 5 DAYS Performed at [facility]    Report Status 06/10/2015 FINAL  Final  Culture, blood (routine x 2)     Status: None   Collection Time: 06/09/15  1:15 PM  Result Value Ref Range Status   Specimen Description BLOOD RIGHT ARM  Final   Special Requests BOTTLES DRAWN AEROBIC AND ANAEROBIC  10CC  Final   Culture   Final    NO GROWTH 5 DAYS Performed at [facility]    Report Status 06/14/2015 FINAL  Final  Culture, blood (routine x 2)     Status: None   Collection Time: 06/09/15  1:20 PM  Result Value Ref Range Status   Specimen Description BLOOD RIGHT HAND  Final   Special Requests IN PEDIATRIC BOTTLE  [facility]  Final   Culture   Final     NO GROWTH 5 DAYS Performed at [facility]    Report Status 06/14/2015 FINAL  Final  Culture, blood (routine x 2)     Status: None (Preliminary result)   Collection Time: 06/13/15  1:35 PM  Result Value Ref Range Status   Specimen Description BLOOD LEFT HAND  Final   Special Requests BOTTLES DRAWN AEROBIC AND ANAEROBIC 10CCS  Final   Culture NO GROWTH 2 DAYS  Final   Report Status PENDING  Incomplete  Culture, blood (routine x 2)     Status: None (Preliminary result)   Collection Time: 06/13/15  1:40 PM  Result Value Ref Range Status   Specimen Description BLOOD LEFT ANTECUBITAL  Final   Special Requests BOTTLES DRAWN AEROBIC AND ANAEROBIC 10CCS  Final   Culture NO GROWTH 2  DAYS  Final   Report Status PENDING  Incomplete       Today   Subjective    Rice Walsh today has no chest abdominal pain,no new weakness tingling or numbness, feels much better wants to go home today. Mild generalized headache and chronic low back pain.    Objective   Blood pressure 125/81, pulse 78, temperature 98.4 F (36.9 C), temperature source Oral, resp. rate 20, height '5\' 7"'  (1.702 m), weight 104.599 kg (230 lb 9.6 oz), SpO2 100 %.   Intake/Output Summary (Last 24 hours) at 06/15/15 1650 Last data filed at 06/15/15 0600  Gross per 24 hour  Intake      0 ml  Output   1175 ml  Net  -1175 ml    Exam Awake Alert, Oriented x 3, No new F.N deficits, Normal affect [place].AT,PERRAL Supple Neck,No JVD, No cervical lymphadenopathy appriciated.  Symmetrical Chest wall movement, Good air movement bilaterally, CTAB RRR,No Gallops,Rubs or new Murmurs, No Parasternal Heave +ve B.Sounds, Abd Soft, Non tender, No organomegaly appriciated, No rebound -guarding or rigidity. No Cyanosis, Clubbing or edema, No new Rash or bruise L.Spine postop scar appears stable, patient again appears to be in no distress whatsoever   Data Review   CBC w Diff:  Lab Results  Component Value Date   WBC 9.1  06/14/2015   WBC 12.9* 06/04/2015   WBC 5.4 07/14/2011   HGB 11.4* 06/14/2015   HGB 13.1* 06/04/2015   HGB 14.3 07/14/2011   HCT 34.6* 06/14/2015   HCT 38.3* 06/04/2015   HCT 41.4 07/14/2011   PLT 154 06/14/2015   PLT 130* 07/14/2011   LYMPHOPCT 22 06/14/2015   LYMPHOPCT 32.6 07/14/2011   MONOPCT 11 06/14/2015   MONOPCT 5.7 07/14/2011   EOSPCT 3 06/14/2015   EOSPCT 2.4 07/14/2011   BASOPCT 0 06/14/2015   BASOPCT 0.4 07/14/2011    CMP:  Lab Results  Component Value Date   NA 142 06/14/2015   K 4.6 06/14/2015   CL 102 06/14/2015   CO2 30 06/14/2015   BUN 13 06/14/2015   CREATININE 1.01 06/14/2015   CREATININE 1.11 06/04/2015   PROT 7.5 06/05/2015   ALBUMIN 4.2 06/05/2015   BILITOT 1.6* 06/05/2015   ALKPHOS 59 06/05/2015   AST 19 06/05/2015   ALT 24 06/05/2015  .   Total Time in preparing paper work, data evaluation and todays exam - 35 minutes  Thurnell Lose M.D on 06/15/2015 at 4:50 PM  [facility]   Office  [phone]

## 2015-06-15 NOTE — Discharge Instructions (Signed)
Follow with Primary MD LE, THAO PHUONG, DO in 3-4 days   Get CBC, CMP, 2 view Chest X ray checked  by Primary MD next visit.    Activity: As tolerated with Full fall precautions use walker/cane & assistance as needed   Disposition Home     Diet:   Heart Healthy    For Heart failure patients - Check your Weight same time everyday, if you gain over 2 pounds, or you develop in leg swelling, experience more shortness of breath or chest pain, call your Primary MD immediately. Follow Cardiac Low Salt Diet and 1.5 lit/day fluid restriction.   On your next visit with your primary care physician please Get Medicines reviewed and adjusted.   Please request your Prim.MD to go over all Hospital Tests and Procedure/Radiological results at the follow up, please get all Hospital records sent to your Prim MD by signing hospital release before you go home.   If you experience worsening of your admission symptoms, develop shortness of breath, life threatening emergency, suicidal or homicidal thoughts you must seek medical attention immediately by calling 911 or calling your MD immediately  if symptoms less severe.  You Must read complete instructions/literature along with all the possible adverse reactions/side effects for all the Medicines you take and that have been prescribed to you. Take any new Medicines after you have completely understood and accpet all the possible adverse reactions/side effects.   Do not drive, operating heavy machinery, perform activities at heights, swimming or participation in water activities or provide baby sitting services if your were admitted for syncope or siezures until you have seen by Primary MD or a Neurologist and advised to do so again.  Do not drive when taking Pain medications.    Do not take more than prescribed Pain, Sleep and Anxiety Medications  Special Instructions: If you have smoked or chewed Tobacco  in the last 2 yrs please stop smoking, stop any  regular Alcohol  and or any Recreational drug use.  Wear Seat belts while driving.   Please note  You were cared for by a hospitalist during your hospital stay. If you have any questions about your discharge medications or the care you received while you were in the hospital after you are discharged, you can call the unit and asked to speak with the hospitalist on call if the hospitalist that took care of you is not available. Once you are discharged, your primary care physician will handle any further medical issues. Please note that NO REFILLS for any discharge medications will be authorized once you are discharged, as it is imperative that you return to your primary care physician (or establish a relationship with a primary care physician if you do not have one) for your aftercare needs so that they can reassess your need for medications and monitor your lab values.

## 2015-06-17 ENCOUNTER — Other Ambulatory Visit: Payer: Self-pay

## 2015-06-17 MED ORDER — CITALOPRAM HYDROBROMIDE 10 MG PO TABS: 10.0000 mg | ORAL_TABLET | Freq: Every day | ORAL | Status: DC

## 2015-06-17 MED FILL — Fentanyl Citrate Preservative Free (PF) Inj 100 MCG/2ML: INTRAMUSCULAR | Qty: 2 | Status: AC

## 2015-06-17 MED FILL — Midazolam HCl Inj 2 MG/2ML (Base Equivalent): INTRAMUSCULAR | Qty: 2 | Status: AC

## 2015-06-17 MED FILL — Ondansetron HCl Inj 4 MG/2ML (2 MG/ML): INTRAMUSCULAR | Qty: 2 | Status: AC

## 2015-06-18 LAB — CULTURE, BLOOD (ROUTINE X 2)
Culture: NO GROWTH
Culture: NO GROWTH

## 2015-06-20 ENCOUNTER — Telehealth: Payer: Self-pay | Admitting: *Deleted

## 2015-06-20 NOTE — Telephone Encounter (Signed)
Received fax with lab results from [facility] with an alert vancomycin trough of 26.5 and an elevated creatinine of 1.51. Called pharmacy [phone] to see if the vanc was being held and they do not have an order for pharmacy to dose. Gave Dr. Blair Dolphin pager # so they can get a verbal order to dose per pharmacy protocol. Wendall Mola

## 2015-06-24 ENCOUNTER — Emergency Department (HOSPITAL_COMMUNITY): Payer: Worker's Compensation

## 2015-06-24 ENCOUNTER — Ambulatory Visit: Payer: Self-pay | Admitting: Internal Medicine

## 2015-06-24 ENCOUNTER — Encounter (HOSPITAL_COMMUNITY): Payer: Self-pay | Admitting: *Deleted

## 2015-06-24 ENCOUNTER — Telehealth: Payer: Self-pay | Admitting: Internal Medicine

## 2015-06-24 ENCOUNTER — Inpatient Hospital Stay (HOSPITAL_COMMUNITY): Payer: Worker's Compensation

## 2015-06-24 ENCOUNTER — Inpatient Hospital Stay (HOSPITAL_COMMUNITY)
Admission: EM | Admit: 2015-06-24 | Discharge: 2015-06-30 | DRG: 682 | Disposition: A | Discharge status: Home Health | Payer: Worker's Compensation | Attending: Internal Medicine | Admitting: Internal Medicine

## 2015-06-24 DIAGNOSIS — Z818 Family history of other mental and behavioral disorders: Secondary | ICD-10-CM | POA: Diagnosis not present

## 2015-06-24 DIAGNOSIS — Z833 Family history of diabetes mellitus: Secondary | ICD-10-CM

## 2015-06-24 DIAGNOSIS — Z8249 Family history of ischemic heart disease and other diseases of the circulatory system: Secondary | ICD-10-CM

## 2015-06-24 DIAGNOSIS — Z6841 Body Mass Index (BMI) 40.0 and over, adult: Secondary | ICD-10-CM | POA: Diagnosis not present

## 2015-06-24 DIAGNOSIS — D7589 Other specified diseases of blood and blood-forming organs: Secondary | ICD-10-CM | POA: Diagnosis present

## 2015-06-24 DIAGNOSIS — Y838 Other surgical procedures as the cause of abnormal reaction of the patient, or of later complication, without mention of misadventure at the time of the procedure: Secondary | ICD-10-CM | POA: Diagnosis present

## 2015-06-24 DIAGNOSIS — E860 Dehydration: Secondary | ICD-10-CM | POA: Diagnosis present

## 2015-06-24 DIAGNOSIS — Z79899 Other long term (current) drug therapy: Secondary | ICD-10-CM

## 2015-06-24 DIAGNOSIS — T814XXA Infection following a procedure, initial encounter: Secondary | ICD-10-CM | POA: Diagnosis present

## 2015-06-24 DIAGNOSIS — Z9889 Other specified postprocedural states: Secondary | ICD-10-CM | POA: Diagnosis not present

## 2015-06-24 DIAGNOSIS — Z79891 Long term (current) use of opiate analgesic: Secondary | ICD-10-CM

## 2015-06-24 DIAGNOSIS — D61818 Other pancytopenia: Secondary | ICD-10-CM | POA: Diagnosis present

## 2015-06-24 DIAGNOSIS — T368X5A Adverse effect of other systemic antibiotics, initial encounter: Secondary | ICD-10-CM | POA: Diagnosis present

## 2015-06-24 DIAGNOSIS — D6959 Other secondary thrombocytopenia: Secondary | ICD-10-CM | POA: Diagnosis present

## 2015-06-24 DIAGNOSIS — D696 Thrombocytopenia, unspecified: Secondary | ICD-10-CM | POA: Diagnosis not present

## 2015-06-24 DIAGNOSIS — R51 Headache: Secondary | ICD-10-CM | POA: Diagnosis present

## 2015-06-24 DIAGNOSIS — R6883 Chills (without fever): Secondary | ICD-10-CM | POA: Diagnosis not present

## 2015-06-24 DIAGNOSIS — Z823 Family history of stroke: Secondary | ICD-10-CM

## 2015-06-24 DIAGNOSIS — N179 Acute kidney failure, unspecified: Secondary | ICD-10-CM | POA: Diagnosis not present

## 2015-06-24 DIAGNOSIS — B957 Other staphylococcus as the cause of diseases classified elsewhere: Secondary | ICD-10-CM | POA: Diagnosis present

## 2015-06-24 DIAGNOSIS — R197 Diarrhea, unspecified: Secondary | ICD-10-CM | POA: Diagnosis present

## 2015-06-24 DIAGNOSIS — N170 Acute kidney failure with tubular necrosis: Secondary | ICD-10-CM | POA: Diagnosis present

## 2015-06-24 DIAGNOSIS — D72819 Decreased white blood cell count, unspecified: Secondary | ICD-10-CM | POA: Diagnosis not present

## 2015-06-24 DIAGNOSIS — M545 Low back pain, unspecified: Secondary | ICD-10-CM | POA: Diagnosis present

## 2015-06-24 DIAGNOSIS — Z885 Allergy status to narcotic agent status: Secondary | ICD-10-CM

## 2015-06-24 DIAGNOSIS — J42 Unspecified chronic bronchitis: Secondary | ICD-10-CM | POA: Diagnosis present

## 2015-06-24 DIAGNOSIS — E66812 Obesity, class 2: Secondary | ICD-10-CM | POA: Diagnosis present

## 2015-06-24 DIAGNOSIS — Z8661 Personal history of infections of the central nervous system: Secondary | ICD-10-CM | POA: Diagnosis not present

## 2015-06-24 DIAGNOSIS — Z9049 Acquired absence of other specified parts of digestive tract: Secondary | ICD-10-CM | POA: Diagnosis not present

## 2015-06-24 DIAGNOSIS — G003 Staphylococcal meningitis: Secondary | ICD-10-CM | POA: Diagnosis present

## 2015-06-24 DIAGNOSIS — R339 Retention of urine, unspecified: Secondary | ICD-10-CM | POA: Diagnosis not present

## 2015-06-24 DIAGNOSIS — K5900 Constipation, unspecified: Secondary | ICD-10-CM | POA: Diagnosis present

## 2015-06-24 DIAGNOSIS — Z87891 Personal history of nicotine dependence: Secondary | ICD-10-CM

## 2015-06-24 DIAGNOSIS — Z6838 Body mass index (BMI) 38.0-38.9, adult: Secondary | ICD-10-CM

## 2015-06-24 DIAGNOSIS — R519 Headache, unspecified: Secondary | ICD-10-CM | POA: Diagnosis present

## 2015-06-24 DIAGNOSIS — Z820 Family history of epilepsy and other diseases of the nervous system: Secondary | ICD-10-CM

## 2015-06-24 DIAGNOSIS — Z792 Long term (current) use of antibiotics: Secondary | ICD-10-CM

## 2015-06-24 DIAGNOSIS — M1990 Unspecified osteoarthritis, unspecified site: Secondary | ICD-10-CM | POA: Diagnosis present

## 2015-06-24 LAB — URINALYSIS, ROUTINE W REFLEX MICROSCOPIC
Bilirubin Urine: NEGATIVE
Bilirubin Urine: NEGATIVE
Glucose, UA: NEGATIVE mg/dL
Glucose, UA: NEGATIVE mg/dL
Hgb urine dipstick: NEGATIVE
Hgb urine dipstick: NEGATIVE
Ketones, ur: 15 mg/dL — AB
Ketones, ur: NEGATIVE mg/dL
Leukocytes, UA: NEGATIVE
Leukocytes, UA: NEGATIVE
Nitrite: NEGATIVE
Nitrite: NEGATIVE
Protein, ur: NEGATIVE mg/dL
Protein, ur: NEGATIVE mg/dL
Specific Gravity, Urine: 1.016 (ref 1.005–1.030)
Specific Gravity, Urine: 1.014 (ref 1.005–1.030)
pH: 5 (ref 5.0–8.0)
pH: 5 (ref 5.0–8.0)

## 2015-06-24 LAB — COMPREHENSIVE METABOLIC PANEL
ALT: 24 U/L (ref 17–63)
ALT: 23 U/L (ref 17–63)
AST: 21 U/L (ref 15–41)
AST: 20 U/L (ref 15–41)
Albumin: 3.9 g/dL (ref 3.5–5.0)
Albumin: 3.6 g/dL (ref 3.5–5.0)
Alkaline Phosphatase: 57 U/L (ref 38–126)
Alkaline Phosphatase: 53 U/L (ref 38–126)
Anion gap: 14 (ref 5–15)
Anion gap: 12 (ref 5–15)
BUN: 41 mg/dL — ABNORMAL HIGH (ref 6–20)
BUN: 41 mg/dL — ABNORMAL HIGH (ref 6–20)
CO2: 20 mmol/L — ABNORMAL LOW (ref 22–32)
CO2: 20 mmol/L — ABNORMAL LOW (ref 22–32)
Calcium: 8.9 mg/dL (ref 8.9–10.3)
Calcium: 8.5 mg/dL — ABNORMAL LOW (ref 8.9–10.3)
Chloride: 105 mmol/L (ref 101–111)
Chloride: 109 mmol/L (ref 101–111)
Creatinine, Ser: 3.94 mg/dL — ABNORMAL HIGH (ref 0.61–1.24)
Creatinine, Ser: 3.86 mg/dL — ABNORMAL HIGH (ref 0.61–1.24)
GFR calc Af Amer: 21 mL/min — ABNORMAL LOW (ref >60)
GFR calc Af Amer: 21 mL/min — ABNORMAL LOW (ref >60)
GFR calc non Af Amer: 18 mL/min — ABNORMAL LOW (ref >60)
GFR calc non Af Amer: 18 mL/min — ABNORMAL LOW (ref >60)
Glucose, Bld: 84 mg/dL (ref 65–99)
Glucose, Bld: 84 mg/dL (ref 65–99)
Potassium: 4.2 mmol/L (ref 3.5–5.1)
Potassium: 4.3 mmol/L (ref 3.5–5.1)
Sodium: 139 mmol/L (ref 135–145)
Sodium: 141 mmol/L (ref 135–145)
Total Bilirubin: 0.8 mg/dL (ref 0.3–1.2)
Total Bilirubin: 0.9 mg/dL (ref 0.3–1.2)
Total Protein: 6.7 g/dL (ref 6.5–8.1)
Total Protein: 6.3 g/dL — ABNORMAL LOW (ref 6.5–8.1)

## 2015-06-24 LAB — CBC WITH DIFFERENTIAL/PLATELET
Basophils Absolute: 0 10*3/uL (ref 0.0–0.1)
Basophils Absolute: 0 10*3/uL (ref 0.0–0.1)
Basophils Relative: 1 %
Basophils Relative: 1 %
Eosinophils Absolute: 0.1 10*3/uL (ref 0.0–0.7)
Eosinophils Absolute: 0.1 10*3/uL (ref 0.0–0.7)
Eosinophils Relative: 3 %
Eosinophils Relative: 4 %
HCT: 33.7 % — ABNORMAL LOW (ref 39.0–52.0)
HCT: 29.5 % — ABNORMAL LOW (ref 39.0–52.0)
Hemoglobin: 11.1 g/dL — ABNORMAL LOW (ref 13.0–17.0)
Hemoglobin: 10.1 g/dL — ABNORMAL LOW (ref 13.0–17.0)
Lymphocytes Relative: 27 %
Lymphocytes Relative: 28 %
Lymphs Abs: 0.6 10*3/uL — ABNORMAL LOW (ref 0.7–4.0)
Lymphs Abs: 0.6 10*3/uL — ABNORMAL LOW (ref 0.7–4.0)
MCH: 28.5 pg (ref 26.0–34.0)
MCH: 28.9 pg (ref 26.0–34.0)
MCHC: 32.9 g/dL (ref 30.0–36.0)
MCHC: 34.2 g/dL (ref 30.0–36.0)
MCV: 86.4 fL (ref 78.0–100.0)
MCV: 84.3 fL (ref 78.0–100.0)
Monocytes Absolute: 0.2 10*3/uL (ref 0.1–1.0)
Monocytes Absolute: 0.3 10*3/uL (ref 0.1–1.0)
Monocytes Relative: 7 %
Monocytes Relative: 16 %
Neutro Abs: 1.3 10*3/uL — ABNORMAL LOW (ref 1.7–7.7)
Neutro Abs: 1.1 10*3/uL — ABNORMAL LOW (ref 1.7–7.7)
Neutrophils Relative %: 62 %
Neutrophils Relative %: 53 %
Platelets: 91 10*3/uL — ABNORMAL LOW (ref 150–400)
Platelets: 88 10*3/uL — ABNORMAL LOW (ref 150–400)
RBC: 3.9 MIL/uL — ABNORMAL LOW (ref 4.22–5.81)
RBC: 3.5 MIL/uL — ABNORMAL LOW (ref 4.22–5.81)
RDW: 12.2 % (ref 11.5–15.5)
RDW: 12 % (ref 11.5–15.5)
WBC: 2.2 10*3/uL — ABNORMAL LOW (ref 4.0–10.5)
WBC: 2 10*3/uL — ABNORMAL LOW (ref 4.0–10.5)

## 2015-06-24 LAB — PHOSPHORUS: Phosphorus: 4.8 mg/dL — ABNORMAL HIGH (ref 2.5–4.6)

## 2015-06-24 LAB — I-STAT CG4 LACTIC ACID, ED: Lactic Acid, Venous: 0.61 mmol/L (ref 0.5–2.0)

## 2015-06-24 LAB — PROTEIN, CSF: Total  Protein, CSF: 105 mg/dL — ABNORMAL HIGH (ref 15–45)

## 2015-06-24 LAB — APTT: aPTT: 30 seconds (ref 24–37)

## 2015-06-24 LAB — CREATININE, URINE, RANDOM: Creatinine, Urine: 160.98 mg/dL

## 2015-06-24 LAB — CSF CELL COUNT WITH DIFFERENTIAL
Eosinophils, CSF: 0 % (ref 0–1)
Lymphs, CSF: 86 % — ABNORMAL HIGH (ref 40–80)
Monocyte-Macrophage-Spinal Fluid: 14 % — ABNORMAL LOW (ref 15–45)
RBC Count, CSF: 1 /mm3 — ABNORMAL HIGH
Segmented Neutrophils-CSF: 0 % (ref 0–6)
Tube #: 4
WBC, CSF: 78 /mm3 (ref 0–5)

## 2015-06-24 LAB — TSH: TSH: 1.053 u[IU]/mL (ref 0.350–4.500)

## 2015-06-24 LAB — MAGNESIUM: Magnesium: 1.8 mg/dL (ref 1.7–2.4)

## 2015-06-24 LAB — CK: Total CK: 27 U/L — ABNORMAL LOW (ref 49–397)

## 2015-06-24 LAB — GLUCOSE, CSF: Glucose, CSF: 30 mg/dL — ABNORMAL LOW (ref 40–70)

## 2015-06-24 LAB — LIPASE, BLOOD: Lipase: 24 U/L (ref 11–51)

## 2015-06-24 LAB — SODIUM, URINE, RANDOM: Sodium, Ur: 70 mmol/L

## 2015-06-24 MED ORDER — DAPTOMYCIN 500 MG IV SOLR
800.0000 mg | INTRAVENOUS | Status: DC
  Filled 2015-06-24: qty 16

## 2015-06-24 MED ORDER — ACETAMINOPHEN 325 MG PO TABS
650.0000 mg | ORAL_TABLET | Freq: Four times a day (QID) | ORAL | Status: DC | PRN
  Administered 2015-06-28: 650 mg via ORAL
  Filled 2015-06-24: qty 2

## 2015-06-24 MED ORDER — HYDROMORPHONE HCL 1 MG/ML IJ SOLN
1.0000 mg | INTRAMUSCULAR | Status: DC | PRN
  Administered 2015-06-24 – 2015-06-30 (×60): 1 mg via INTRAVENOUS
  Filled 2015-06-24 (×63): qty 1

## 2015-06-24 MED ORDER — SODIUM CHLORIDE 0.9 % IV SOLN
INTRAVENOUS | Status: DC
  Administered 2015-06-24 – 2015-06-30 (×15): via INTRAVENOUS

## 2015-06-24 MED ORDER — OXYCODONE HCL ER 20 MG PO T12A
20.0000 mg | EXTENDED_RELEASE_TABLET | Freq: Two times a day (BID) | ORAL | Status: DC
  Administered 2015-06-24 – 2015-06-30 (×12): 20 mg via ORAL
  Filled 2015-06-24 (×12): qty 1

## 2015-06-24 MED ORDER — GABAPENTIN 300 MG PO CAPS
300.0000 mg | ORAL_CAPSULE | Freq: Three times a day (TID) | ORAL | Status: DC
  Filled 2015-06-24 (×3): qty 1

## 2015-06-24 MED ORDER — MELOXICAM 7.5 MG PO TABS
7.5000 mg | ORAL_TABLET | Freq: Every day | ORAL | Status: DC | PRN
  Filled 2015-06-24: qty 1

## 2015-06-24 MED ORDER — SODIUM CHLORIDE 0.9 % IV SOLN
840.0000 mg | INTRAVENOUS | Status: DC
  Filled 2015-06-24: qty 16.8

## 2015-06-24 MED ORDER — ONDANSETRON HCL 4 MG/2ML IJ SOLN
4.0000 mg | Freq: Once | INTRAMUSCULAR | Status: AC
  Administered 2015-06-24: 4 mg via INTRAVENOUS
  Filled 2015-06-24: qty 2

## 2015-06-24 MED ORDER — NALOXEGOL OXALATE 25 MG PO TABS
25.0000 mg | ORAL_TABLET | Freq: Every day | ORAL | Status: DC
  Administered 2015-06-24 – 2015-06-25 (×2): 25 mg via ORAL
  Filled 2015-06-24 (×2): qty 1

## 2015-06-24 MED ORDER — SODIUM CHLORIDE 0.9 % IV BOLUS (SEPSIS)
1000.0000 mL | Freq: Once | INTRAVENOUS | Status: AC
  Administered 2015-06-24: 1000 mL via INTRAVENOUS

## 2015-06-24 MED ORDER — PROMETHAZINE HCL 25 MG PO TABS
12.5000 mg | ORAL_TABLET | Freq: Four times a day (QID) | ORAL | Status: DC | PRN
  Administered 2015-06-25 – 2015-06-29 (×5): 12.5 mg via ORAL
  Filled 2015-06-24 (×5): qty 1

## 2015-06-24 MED ORDER — CITALOPRAM HYDROBROMIDE 20 MG PO TABS
10.0000 mg | ORAL_TABLET | Freq: Every day | ORAL | Status: DC
  Administered 2015-06-25 – 2015-06-30 (×3): 10 mg via ORAL
  Filled 2015-06-24 (×7): qty 1

## 2015-06-24 MED ORDER — SODIUM CHLORIDE 0.9% FLUSH
10.0000 mL | Freq: Two times a day (BID) | INTRAVENOUS | Status: DC
  Administered 2015-06-24 – 2015-06-27 (×3): 10 mL
  Administered 2015-06-27: 20 mL
  Administered 2015-06-28: 10 mL
  Administered 2015-06-28 – 2015-06-29 (×2): 40 mL
  Filled 2015-06-24: qty 40

## 2015-06-24 MED ORDER — FENTANYL CITRATE (PF) 100 MCG/2ML IJ SOLN
100.0000 ug | Freq: Once | INTRAMUSCULAR | Status: AC
  Administered 2015-06-24: 100 ug via INTRAVENOUS
  Filled 2015-06-24: qty 2

## 2015-06-24 MED ORDER — MORPHINE SULFATE (CONCENTRATE) 10 MG/0.5ML PO SOLN: 10.0000 mg | ORAL | Status: DC | PRN

## 2015-06-24 MED ORDER — DAPTOMYCIN 500 MG IV SOLR
840.0000 mg | INTRAVENOUS | Status: DC
  Administered 2015-06-24: 840 mg via INTRAVENOUS
  Filled 2015-06-24 (×2): qty 16.8

## 2015-06-24 MED ORDER — SODIUM CHLORIDE 0.9% FLUSH
10.0000 mL | INTRAVENOUS | Status: DC | PRN
Start: 2015-06-24 — End: 2015-06-30
  Administered 2015-06-28: 10 mL
  Filled 2015-06-24 (×2): qty 40

## 2015-06-24 MED ORDER — SODIUM CHLORIDE 0.9% FLUSH
3.0000 mL | Freq: Two times a day (BID) | INTRAVENOUS | Status: DC
  Administered 2015-06-24 – 2015-06-29 (×5): 3 mL via INTRAVENOUS

## 2015-06-24 MED ORDER — ALBUTEROL SULFATE HFA 108 (90 BASE) MCG/ACT IN AERS
2.0000 | INHALATION_SPRAY | RESPIRATORY_TRACT | Status: DC | PRN
Start: 2015-06-24 — End: 2015-06-24

## 2015-06-24 MED ORDER — HYDROMORPHONE HCL 2 MG PO TABS
2.0000 mg | ORAL_TABLET | Freq: Four times a day (QID) | ORAL | Status: DC | PRN
  Administered 2015-06-25 – 2015-06-27 (×2): 2 mg via ORAL
  Filled 2015-06-24 (×2): qty 1

## 2015-06-24 MED ORDER — LACTULOSE 10 GM/15ML PO SOLN
3.3333 g | Freq: Every day | ORAL | Status: DC | PRN
  Filled 2015-06-24: qty 15

## 2015-06-24 MED ORDER — ACETAMINOPHEN 650 MG RE SUPP: 650.0000 mg | Freq: Four times a day (QID) | RECTAL | Status: DC | PRN

## 2015-06-24 MED ORDER — ALBUTEROL SULFATE (2.5 MG/3ML) 0.083% IN NEBU: 2.5000 mg | INHALATION_SOLUTION | RESPIRATORY_TRACT | Status: DC | PRN

## 2015-06-24 NOTE — Telephone Encounter (Signed)
Bruce Morales's creatinine has risen from 1.01 06/14/2015 to 1.51 on 06/18/2015 and then up to 3.7 yesterday. His vancomycin trough level was elevated last week and his home health agency adjusted his dose but his vancomycin trough level drawn yesterday was 45.8. I spoke with him this morning. He has been bothered by nausea and some vomiting since discharge. I instructed him to go to the emergency department where he will need to have repeat blood work, IV fluids and probable readmission to the hospital. I will stop vancomycin and have my partners see him. 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

## 2015-06-24 NOTE — Telephone Encounter (Signed)
Bruce Morales's creatinine has risen from 1.01 06/14/2015 to 1.51 on 06/18/2015 and then up to 3.7 yesterday. His vancomycin trough level was elevated last week and his home health agency adjusted his dose but his vancomycin trough level drawn yesterday was 45.8. I spoke with him this morning. He has been bothered by nausea and some vomiting since discharge. I instructed him to go to the emergency department where he will need to have repeat blood work, IV fluids and probable readmission to the hospital. I will stop vancomycin and have my partners see him. 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

## 2015-06-24 NOTE — ED Provider Notes (Signed)
CSN: [id number]     Arrival date & time 06/24/15  0911 History   First MD Initiated Contact with Patient 06/24/15 1009     Chief Complaint  Patient presents with  . Back Pain  . Headache     (Consider location/radiation/quality/duration/timing/severity/associated sxs/prior Treatment) HPI 40 year old male presents to ED with a new elevated Creatinine at 3.7. He is on vancomycin for recent diagnosis of MRSA meningitis made earlier this month. His most recent vanc trough was 45.8. These labs were drawn yesterday through home health. ID, Dr. Orvan Falconer, called and told him to come to ER to get admitted. He initially got meningitis from his recent back surgery (presumably). States he has had increasing headaches and neck stiffness similar to original presentation over last few days. 3 days ago had a temp of 99 but otherwise denies fever. Has been having vomiting ever since his diagnosis, but this has also increased over last few days. One loose bowel movement last night.   Past Medical History  Diagnosis Date  . Gall stones   . Chronic lower back pain   . Anginal pain (HCC) 04/15/2014  . Chronic bronchitis (HCC)     "probably get it q yr"  . Arthritis     "wrists, knees, lower back" (04/16/2014)  . Chronic lower back pain    Past Surgical History  Procedure Laterality Date  . Cholecystectomy  2011  . Knee arthroscopy Right 2013 X 2  . Esophagogastroduodenoscopy N/A 02/27/2013    Procedure: ESOPHAGOGASTRODUODENOSCOPY (EGD);  Surgeon: Florencia Reasons, MD;  Location: [facility];  Service: Endoscopy;  Laterality: N/A;  . Left heart catheterization with coronary angiogram N/A 04/17/2014    Procedure: LEFT HEART CATHETERIZATION WITH CORONARY ANGIOGRAM;  Surgeon: Ricki Rodriguez, MD;  Location: [facility] CATH LAB;  Service: Cardiovascular;  Laterality: N/A;  . Radiology with anesthesia N/A 06/11/2015    Procedure: MRI LUMBAR WITH AND WITHOUT CONTRAST;  Surgeon: Medication Radiologist, MD;  Location: [facility]  OR;  Service: Radiology;  Laterality: N/A;   Family History  Problem Relation Age of Onset  . Multiple sclerosis Mother   . Heart failure Maternal Grandfather   . Diabetes Father   . Fibromyalgia Sister   . Schizophrenia Brother   . Stroke Maternal Grandmother   . Diabetes Paternal Grandmother    Social History  Substance Use Topics  . Smoking status: Former Smoker -- 0.50 packs/day for 27 years    Types: Cigarettes    Quit date: 03/11/2015  . Smokeless tobacco: Former Neurosurgeon  . Alcohol Use: 0.0 oz/week    0 Standard drinks or equivalent per week     Comment: 04/16/2014 "a 6 pack will last me a month"    Review of Systems    Allergies  Hydrocodone; Oxycodone; and Percocet  Home Medications   Prior to Admission medications   Medication Sig Start Date End Date Taking? Authorizing Provider  ibuprofen (ADVIL,MOTRIN) 600 MG tablet Take 1 tablet (600 mg total) by mouth every 8 (eight) hours as needed. 06/15/15  Yes Leroy Sea, MD  Morphine Sulfate (MORPHINE CONCENTRATE) 10 MG/0.5ML SOLN concentrated solution Take 0.5 mLs (10 mg total) by mouth every 3 (three) hours as needed for moderate pain or severe pain. 06/15/15  Yes Leroy Sea, MD  oxyCODONE (OXYCONTIN) 20 mg 12 hr tablet Take 1 tablet (20 mg total) by mouth every 12 (twelve) hours. 06/12/15  Yes Leroy Sea, MD  promethazine (PHENERGAN) 25 MG tablet Take 1 tablet (25  mg total) by mouth every 8 (eight) hours as needed for nausea or vomiting. 06/04/15  Yes Thao P Le, DO  vancomycin 1,750 mg in sodium chloride 0.9 % 500 mL Inject 1,750 mg into the vein every 12 (twelve) hours. Follow with ID in 1 month, stop date 08/10/15, Home and can draw vancomycin levels to be followed by home health pharmacy and ID office of Dr.Campbell 06/12/15  Yes Leroy Sea, MD  acetaminophen (TYLENOL) 500 MG tablet Take 1,000 mg by mouth every 6 (six) hours as needed for moderate pain or headache.    Historical Provider, MD  albuterol  (PROVENTIL HFA;VENTOLIN HFA) 108 (90 BASE) MCG/ACT inhaler Inhale 2 puffs into the lungs every 4 (four) hours as needed (cough, shortness of breath or wheezing.). 04/30/15   Collie Siad English, PA  citalopram (CELEXA) 10 MG tablet Take 1 tablet (10 mg total) by mouth daily. Reported on 05/16/2015 06/17/15   Thao P Le, DO  cyclobenzaprine (FLEXERIL) 10 MG tablet Take 1 tablet (10 mg total) by mouth 2 (two) times daily as needed for muscle spasms. Patient not taking: Reported on 06/04/2015 04/21/15   Benjiman Core, MD  gabapentin (NEURONTIN) 300 MG capsule Take 1 capsule by mouth 3 (three) times daily. 02/18/15   Historical Provider, MD  HYDROmorphone (DILAUDID) 2 MG tablet Take 1 tablet (2 mg total) by mouth every 6 (six) hours as needed for severe pain. 06/12/15   Leroy Sea, MD  lactulose (CHRONULAC) 10 GM/15ML solution Take 5-10 mLs by mouth daily as needed. 06/03/15   Historical Provider, MD  meloxicam (MOBIC) 15 MG tablet Take 7.5 mg by mouth daily as needed for pain.     Historical Provider, MD  methocarbamol (ROBAXIN) 750 MG tablet Take 750 mg by mouth every 8 (eight) hours as needed. 03/17/15   Historical Provider, MD  MOVANTIK 25 MG TABS tablet take  daily 05/07/15   Historical Provider, MD   BP 139/133 mmHg  Pulse 113  Temp(Src) 98.9 F (37.2 C) (Oral)  Resp 18  SpO2 100% Physical Exam  Constitutional: He is oriented to person, place, and time. He appears well-developed and well-nourished.  HENT:  Head: Normocephalic and atraumatic.  Right Ear: External ear normal.  Left Ear: External ear normal.  Nose: Nose normal.  Eyes: Right eye exhibits no discharge. Left eye exhibits no discharge.  Neck: No rigidity. Decreased range of motion present.  Able to range neck but with pain.  Cardiovascular: Normal rate, regular rhythm, normal heart sounds and intact distal pulses.   Pulmonary/Chest: Effort normal and breath sounds normal.  Abdominal: Soft. He exhibits no distension. There  is no tenderness.  Musculoskeletal: He exhibits no edema.  Neurological: He is alert and oriented to person, place, and time.  Awake, alert and appropriate. Equal strength in all 4 extremities.  Skin: Skin is warm and dry. He is not diaphoretic.  Nursing note and vitals reviewed.   ED Course  Procedures (including critical care time) Labs Review Labs Reviewed  COMPREHENSIVE METABOLIC PANEL - Abnormal; Notable for the following:    CO2 20 (*)    BUN 41 (*)    Creatinine, Ser 3.94 (*)    GFR calc non Af Amer 18 (*)    GFR calc Af Amer 21 (*)    All other components within normal limits  CBC WITH DIFFERENTIAL/PLATELET - Abnormal; Notable for the following:    WBC 2.2 (*)    RBC 3.90 (*)    Hemoglobin 11.1 (*)  HCT 33.7 (*)    Platelets 91 (*)    Neutro Abs 1.3 (*)    Lymphs Abs 0.6 (*)    All other components within normal limits  URINALYSIS, ROUTINE W REFLEX MICROSCOPIC (NOT AT [facility]) - Abnormal; Notable for the following:    APPearance HAZY (*)    Ketones, ur 15 (*)    All other components within normal limits  CK - Abnormal; Notable for the following:    Total CK 27 (*)    All other components within normal limits  PHOSPHORUS - Abnormal; Notable for the following:    Phosphorus 4.8 (*)    All other components within normal limits  COMPREHENSIVE METABOLIC PANEL - Abnormal; Notable for the following:    CO2 20 (*)    BUN 41 (*)    Creatinine, Ser 3.86 (*)    Calcium 8.5 (*)    Total Protein 6.3 (*)    GFR calc non Af Amer 18 (*)    GFR calc Af Amer 21 (*)    All other components within normal limits  CBC WITH DIFFERENTIAL/PLATELET - Abnormal; Notable for the following:    WBC 2.0 (*)    RBC 3.50 (*)    Hemoglobin 10.1 (*)    HCT 29.5 (*)    Platelets 88 (*)    Neutro Abs 1.1 (*)    Lymphs Abs 0.6 (*)    All other components within normal limits  CULTURE, BLOOD (ROUTINE X 2)  CULTURE, BLOOD (ROUTINE X 2)  GRAM STAIN  CSF CULTURE  LIPASE, BLOOD  MAGNESIUM   APTT  TSH  SODIUM, URINE, RANDOM  CREATININE, URINE, RANDOM  GLUCOSE, CSF  PROTEIN, CSF  CSF CELL COUNT WITH DIFFERENTIAL  I-STAT CG4 LACTIC ACID, ED    Imaging Review No results found. I have personally reviewed and evaluated these images and lab results as part of my medical decision-making.   EKG Interpretation None      MDM   Final diagnoses:  Acute kidney injury (HCC)    Patient's lab work here confirms acute renal failure. His baseline creatinine is around 1. Likely this is related to his vancomycin and possibly from acute dehydration with vomiting. He is not febrile but feels like his headache has returned with similar to prior meningitis. I did discuss with [place] disease on-call, Dr. Drue Second who recommends that we should have a low threshold to repeat the lumbar puncture if indicated. Because of the difficulty with the last lumbar puncture, patient will get an LP under fluoroscopy guidance. ID has recommended switching to daptomycin and will get a CK for baseline. Admit to the hospitalist.     Pricilla Loveless, MD 06/24/15 1544

## 2015-06-24 NOTE — Consult Note (Signed)
[facility] for [facility]ious [facility]  Total days of antibiotics 20        Day 1 daptomycin               Reason for Consult: aki from vanco toxicity    Referring Physician: devine  Principal Problem:   Acute renal failure (ARF) ([place]) Active Problems:   Bilateral low back pain without sciatica   Morbid obesity ([place])   Headache   Recent history of Staphylococcal meningitis   Status post lumbar laminectomy   Pancytopenia (HCC)    HPI: Bruce Morales is a 40 y.o. male who underwent an L5-S1 right hemi-laminectomy on 04/17/2015 by Dr. Ronnald Ramp c/b dural tear and poor wound healing, was briefly on doxycycline at that time. He was readmitted on January 5th, 2017 for evaluation of progressively worsening headaches that was determined to be MRSE meningitis likely secondary infection. Patient was discharged home on on 06/15/15 on vancomycin to take thru 07/17/15, roughly 6 wk course of treatment. Dr. Megan Salon directed the patient to come to the ED for evaluation today when he first noticed in his lab work from 1/23 showing that the patient had AKI with elevated Cr of 3.94, from BL Cr of 1.01 on 1/14. Also vanco random level roughly 45. Lab results also showed that he has leukopenia, and thrombocytopenia.   This past week, he has noticed having chills, rigors, but no overt fever. He had increasing intensity of headache with mild nuchal rigidity. Over the last weekend, felt poorly, stayed bed bound, poor po intake, also developed N/V. He noticed decreased urine output, dark. In the ED, he received 1L IVF, daptomycin and He just underwent repeat LP. CSF cell count shows lymphocytic predominance pleocytosis with 78 cells, TP elevated at 105, glucose of 30. Gram stain pending. he denies any specific abdominal or urinary concerns.he reports slight rash that is now improved on his legs, starting to have some redness to dressing of his right arm picc line. Patient also denies chest pain or shortness of  breath.  Past Medical History  Diagnosis Date  . Gall stones   . Chronic lower back pain   . Anginal pain ([place]) 04/15/2014  . Chronic bronchitis ([place])     "probably get it q yr"  . Arthritis     "wrists, knees, lower back" (04/16/2014)  . Chronic lower back pain     Allergies:  Allergies  Allergen Reactions  . Hydrocodone Hives and Itching  . Oxycodone     Mild Itching, patient can tolerate oxycodone  . Percocet [Oxycodone-Acetaminophen]     Itching, patient says he tolerates    MEDICATIONS: . DAPTOmycin (CUBICIN)  IV  840 mg Intravenous Q48H  . sodium chloride flush  10-40 mL Intracatheter Q12H    Social History  Substance Use Topics  . Smoking status: Former Smoker -- 0.50 packs/day for 27 years    Types: Cigarettes    Quit date: 03/11/2015  . Smokeless tobacco: Former Systems developer  . Alcohol Use: 0.0 oz/week    0 Standard drinks or equivalent per week     Comment: 04/16/2014 "a 6 pack will last me a month"    Family History  Problem Relation Age of Onset  . Multiple sclerosis Mother   . Heart failure Maternal Grandfather   . Diabetes Father   . Fibromyalgia Sister   . Schizophrenia Brother   . Stroke Maternal Grandmother   . Diabetes Paternal Grandmother      Review of  Systems  Constitutional: Negative for fever, positive for  chills, diaphoresis, activity change, appetite change, fatigue and unexpected weight change.  HENT: Negative for congestion, sore throat, rhinorrhea, sneezing, trouble swallowing and sinus pressure.  Eyes: Negative for photophobia and visual disturbance.  Respiratory: Negative for cough, chest tightness, shortness of breath, wheezing and stridor.  Cardiovascular: Negative for chest pain, palpitations and leg swelling.  Gastrointestinal: positive for nausea, vomiting, abdominal pain, diarrhea, constipation, blood in stool, abdominal distention and anal bleeding.  Genitourinary: Negative for dysuria, hematuria, flank pain and difficulty  urinating.  Musculoskeletal: Negative for myalgias, back pain, joint swelling, arthralgias and gait problem.  Skin: Negative for color change, pallor, rash and wound.  Neurological: positive for headacheNegative for dizziness, tremors, weakness and light-headedness.  Hematological: Negative for adenopathy. Does not bruise/bleed easily.  Psychiatric/Behavioral: Negative for behavioral problems, confusion, sleep disturbance, dysphoric mood, decreased concentration and agitation.     OBJECTIVE: Temp:  [98.9 F (37.2 C)] 98.9 F (37.2 C) (01/24 0922) Pulse Rate:  [97-144] 97 (01/24 1123) Resp:  [17-18] 17 (01/24 1123) BP: (117-139)/(79-133) 117/79 mmHg (01/24 1123) SpO2:  [95 %-100 %] 95 % (01/24 1123) Physical Exam  Constitutional: He is oriented to person, place, and time. He appears well-developed and well-nourished. No distress.  HENT: mild nuchal rigidity Mouth/Throat: Oropharynx is clear and moist. No oropharyngeal exudate.  Cardiovascular: Normal rate, regular rhythm and normal heart sounds. Exam reveals no gallop and no friction rub.  No murmur heard.  Pulmonary/Chest: Effort normal and breath sounds normal. No respiratory distress. He has no wheezes.  Abdominal: Soft. Bowel sounds are normal. He exhibits no distension. There is no tenderness.  Lymphadenopathy:  He has no cervical adenopathy.  Neurological: He is alert and oriented to person, place, and time.  Skin: Skin is warm and dry. No rash noted. No erythema.multiple tattoos. picc line is c/d/i Psychiatric: He has a normal mood and affect. His behavior is normal.    LABS: Results for orders placed or performed during the hospital encounter of 06/24/15 (from the past 48 hour(s))  Comprehensive metabolic panel     Status: Abnormal   Collection Time: 06/24/15 11:29 AM  Result Value Ref Range   Sodium 139 135 - 145 mmol/L   Potassium 4.2 3.5 - 5.1 mmol/L   Chloride 105 101 - 111 mmol/L   CO2 20 (L) 22 - 32 mmol/L    Glucose, Bld 84 65 - 99 mg/dL   BUN 41 (H) 6 - 20 mg/dL   Creatinine, Ser 3.94 (H) 0.61 - 1.24 mg/dL   Calcium 8.9 8.9 - 10.3 mg/dL   Total Protein 6.7 6.5 - 8.1 g/dL   Albumin 3.9 3.5 - 5.0 g/dL   AST 21 15 - 41 U/L   ALT 24 17 - 63 U/L   Alkaline Phosphatase 57 38 - 126 U/L   Total Bilirubin 0.8 0.3 - 1.2 mg/dL   GFR calc non Af Amer 18 (L) >60 mL/min   GFR calc Af Amer 21 (L) >60 mL/min    Comment: (NOTE) The eGFR has been calculated using the CKD EPI equation. This calculation has not been validated in all clinical situations. eGFR's persistently <60 mL/min signify possible Chronic Kidney Disease.    Anion gap 14 5 - 15  CBC with Differential     Status: Abnormal   Collection Time: 06/24/15 11:29 AM  Result Value Ref Range   WBC 2.2 (L) 4.0 - 10.5 K/uL   RBC 3.90 (L) 4.22 - 5.81 MIL/uL  Hemoglobin 11.1 (L) 13.0 - 17.0 g/dL   HCT 33.7 (L) 39.0 - 52.0 %   MCV 86.4 78.0 - 100.0 fL   MCH 28.5 26.0 - 34.0 pg   MCHC 32.9 30.0 - 36.0 g/dL   RDW 12.2 11.5 - 15.5 %   Platelets 91 (L) 150 - 400 K/uL    Comment: SPECIMEN CHECKED FOR CLOTS PLATELET COUNT CONFIRMED BY SMEAR    Neutrophils Relative % 62 %   Lymphocytes Relative 27 %   Monocytes Relative 7 %   Eosinophils Relative 3 %   Basophils Relative 1 %   Neutro Abs 1.3 (L) 1.7 - 7.7 K/uL   Lymphs Abs 0.6 (L) 0.7 - 4.0 K/uL   Monocytes Absolute 0.2 0.1 - 1.0 K/uL   Eosinophils Absolute 0.1 0.0 - 0.7 K/uL   Basophils Absolute 0.0 0.0 - 0.1 K/uL   Smear Review MORPHOLOGY UNREMARKABLE   Lipase, blood     Status: None   Collection Time: 06/24/15 11:29 AM  Result Value Ref Range   Lipase 24 11 - 51 U/L  CK     Status: Abnormal   Collection Time: 06/24/15 11:29 AM  Result Value Ref Range   Total CK 27 (L) 49 - 397 U/L  I-Stat CG4 Lactic Acid, ED     Status: None   Collection Time: 06/24/15 11:33 AM  Result Value Ref Range   Lactic Acid, Venous 0.61 0.5 - 2.0 mmol/L  Magnesium     Status: None   Collection Time:  06/24/15  1:47 PM  Result Value Ref Range   Magnesium 1.8 1.7 - 2.4 mg/dL  Phosphorus     Status: Abnormal   Collection Time: 06/24/15  1:47 PM  Result Value Ref Range   Phosphorus 4.8 (H) 2.5 - 4.6 mg/dL  Comprehensive metabolic panel     Status: Abnormal   Collection Time: 06/24/15  1:47 PM  Result Value Ref Range   Sodium 141 135 - 145 mmol/L   Potassium 4.3 3.5 - 5.1 mmol/L   Chloride 109 101 - 111 mmol/L   CO2 20 (L) 22 - 32 mmol/L   Glucose, Bld 84 65 - 99 mg/dL   BUN 41 (H) 6 - 20 mg/dL   Creatinine, Ser 3.86 (H) 0.61 - 1.24 mg/dL   Calcium 8.5 (L) 8.9 - 10.3 mg/dL   Total Protein 6.3 (L) 6.5 - 8.1 g/dL   Albumin 3.6 3.5 - 5.0 g/dL   AST 20 15 - 41 U/L   ALT 23 17 - 63 U/L   Alkaline Phosphatase 53 38 - 126 U/L   Total Bilirubin 0.9 0.3 - 1.2 mg/dL   GFR calc non Af Amer 18 (L) >60 mL/min   GFR calc Af Amer 21 (L) >60 mL/min    Comment: (NOTE) The eGFR has been calculated using the CKD EPI equation. This calculation has not been validated in all clinical situations. eGFR's persistently <60 mL/min signify possible Chronic Kidney Disease.    Anion gap 12 5 - 15  CBC WITH DIFFERENTIAL     Status: Abnormal   Collection Time: 06/24/15  1:47 PM  Result Value Ref Range   WBC 2.0 (L) 4.0 - 10.5 K/uL   RBC 3.50 (L) 4.22 - 5.81 MIL/uL   Hemoglobin 10.1 (L) 13.0 - 17.0 g/dL   HCT 29.5 (L) 39.0 - 52.0 %   MCV 84.3 78.0 - 100.0 fL   MCH 28.9 26.0 - 34.0 pg   MCHC 34.2 30.0 - 36.0  g/dL   RDW 12.0 11.5 - 15.5 %   Platelets 88 (L) 150 - 400 K/uL    Comment: REPEATED TO VERIFY SPECIMEN CHECKED FOR CLOTS PLATELET COUNT CONFIRMED BY SMEAR    Neutrophils Relative % 53 %   Neutro Abs 1.1 (L) 1.7 - 7.7 K/uL   Lymphocytes Relative 28 %   Lymphs Abs 0.6 (L) 0.7 - 4.0 K/uL   Monocytes Relative 16 %   Monocytes Absolute 0.3 0.1 - 1.0 K/uL   Eosinophils Relative 4 %   Eosinophils Absolute 0.1 0.0 - 0.7 K/uL   Basophils Relative 1 %   Basophils Absolute 0.0 0.0 - 0.1 K/uL  APTT      Status: None   Collection Time: 06/24/15  1:47 PM  Result Value Ref Range   aPTT 30 24 - 37 seconds  Urinalysis, Routine w reflex microscopic     Status: Abnormal   Collection Time: 06/24/15  2:13 PM  Result Value Ref Range   Color, Urine YELLOW YELLOW   APPearance HAZY (A) CLEAR   Specific Gravity, Urine 1.016 1.005 - 1.030   pH 5.0 5.0 - 8.0   Glucose, UA NEGATIVE NEGATIVE mg/dL   Hgb urine dipstick NEGATIVE NEGATIVE   Bilirubin Urine NEGATIVE NEGATIVE   Ketones, ur 15 (A) NEGATIVE mg/dL   Protein, ur NEGATIVE NEGATIVE mg/dL   Nitrite NEGATIVE NEGATIVE   Leukocytes, UA NEGATIVE NEGATIVE    Comment: MICROSCOPIC NOT DONE ON URINES WITH NEGATIVE PROTEIN, BLOOD, LEUKOCYTES, NITRITE, OR GLUCOSE <1000 mg/dL.    MICRO: 1/24 blood cx and csf cx pending  HISTORICAL MICRO/IMAGING 1/5 csf MRSE- rare  Assessment/Plan:  40yo M with secondary MRSE meningitis now has vancomycin toxicity with AKI, bone marrow suppression.   MRSE meningitis complicated post surgical lumbar surgery infection - recommend to change to daptomycin 22m/kg Q 48hr. Will need baseline CK and weekly CK while on daptomycin. Still plan to take til Jul 17, 2015.  AKI from vancomycin toxicity = recommend to give IVF @ 1043m125ml/hr x 24 hr. Likely component of pre-renal since he had poor oral intake prior to admit. Secondly also to see if some of injury is reversible. Recommend renal consultation. Consider getting random level tomorrow to see how he is clearing vancomycin.   Leukopenia/thrombocytopenia = probably related to vancomycin. Should improve as he is off the medication  Chills = blood cx are pending to see if any secondary bloodstream infection.   Isolation- no need for droplet isolation,

## 2015-06-24 NOTE — H&P (Addendum)
[facility] History and Physical  Bruce Morales ZOX:[id number] DOB: 06/21/1975 DOA: 06/24/2015  Referring physician: ER physician, Dr. Criss Alvine PCP: Rockne Coons, DO  Chief Complaint:   HPI:  Patient is very pleasant 40 year old male, status post L5-S1 right hemi-laminectomy on 04/17/2015 by Dr. Yetta Barre, was discharged on doxycycline at that time, recent admission from January 5 2 06/15/2015 for evaluation of progressively worsening headaches that were determined to be secondary to MRSA meningitis after patient had lumbar puncture that revealed CSF cultures positive for MRSA. Patient was discharged home on vancomycin and has been followed by infectious disease specialist Dr. Orvan Falconer (tentative date for vancomycin completion was set at 08/10/2015). Patient had vancomycin level checked at infectious disease clinic and was advised to go to emergency department for further evaluation. Patient reports he feels overall better since last discharge but continues to have low-grade temperatures 99 F, he says he is headaches are less intense and less frequent, he denies any specific abdominal or urinary concerns. Patient also denies chest pain or shortness of breath.  In emergency department, vital signs stable, blood work notable for WBC 2.2, hemoglobin 11, platelets 91, creatinine 3.94. [facility] asked to admit for further evaluation of acute renal failure.  Assessment & Plan    Principal Problem:   Acute renal failure (ARF) (HCC) - Appears to be related to vancomycin nephrotoxicity  - Stopped vanco  - Obtain urine sodium and urine creatinine, renal ultrasound  - Monitor urine output  - Provide IV fluids   - Repeat BMP in the morning   Active Problems:   Pancytopenia (HCC) - Unclear etiology, perhaps due to vancomycin  - We'll keep close eye on blood counts, CBC in the morning  - Currently no indication for transfusion     Bilateral low back pain without sciatica - Provide analgesia  as needed as this has been chronic issue for patient  - Physical therapy once able to tolerate     Morbid obesity (HCC) - BMI greater than 40  - Counseled on nutrition    Headache - Provide analgesia as needed  - Per ID, plan for LP later on today     Recent history of Staphylococcal meningitis - Tentative end day for antibiotic coverage was scheduled to be 08/10/2015  - Per ID, abx changed to daptomycin     Status post lumbar laminectomy - Notify Dr. Yetta Barre of patient's admission   DVT prophylaxis - SCDs   Radiological Exams on Admission: No results found.  EKG: pending   Code Status: Full Family Communication: Plan of care discussed with the patient and wife at bedside  Disposition Plan: Admit for further evaluation, telemetry   Manson Passey, MD  [facility] Pager [phone]  Time spent in minutes: 75 minutes  Review of Systems:  Constitutional: Negative for diaphoresis.  HENT: Negative for hearing loss, ear pain, nosebleeds, congestion, sore throat, neck pain, tinnitus and ear discharge.   Eyes: Negative for blurred vision, double vision, photophobia, pain, discharge and redness.  Respiratory: Negative for cough, hemoptysis, sputum production, shortness of breath, wheezing and stridor.   Cardiovascular: Negative for chest pain, palpitations, orthopnea, claudication and leg swelling.  Gastrointestinal: Negative for nausea, vomiting and abdominal pain. Negative for heartburn, constipation, blood in stool and melena.  Genitourinary: Negative for hematuria and flank pain.  Musculoskeletal: Negative for myalgias, back pain, joint pain and falls.  Skin: Negative for itching and rash.  Neurological: Negative for dizziness and weakness. Endo/Heme/Allergies: Negative for environmental allergies and polydipsia.  Does not bruise/bleed easily.  Psychiatric/Behavioral: Negative for suicidal ideas. The patient is not nervous/anxious.      Past Medical History  Diagnosis  Date  . Gall stones   . Chronic lower back pain   . Anginal pain (HCC) 04/15/2014  . Chronic bronchitis ([facility])     "probably get it q yr"  . Arthritis     "wrists, knees, lower back" (04/16/2014)  . Chronic lower back pain    Past Surgical History  Procedure Laterality Date  . Cholecystectomy  2011  . Knee arthroscopy Right 2013 X 2  . Esophagogastroduodenoscopy N/A 02/27/2013    Procedure: ESOPHAGOGASTRODUODENOSCOPY (EGD);  Surgeon: Florencia Reasons, MD;  Location: [facility];  Service: Endoscopy;  Laterality: N/A;  . Left heart catheterization with coronary angiogram N/A 04/17/2014    Procedure: LEFT HEART CATHETERIZATION WITH CORONARY ANGIOGRAM;  Surgeon: Ricki Rodriguez, MD;  Location: [facility] CATH LAB;  Service: Cardiovascular;  Laterality: N/A;  . Radiology with anesthesia N/A 06/11/2015    Procedure: MRI LUMBAR WITH AND WITHOUT CONTRAST;  Surgeon: Medication Radiologist, MD;  Location: [facility] OR;  Service: Radiology;  Laterality: N/A;   Social History:  reports that he quit smoking about 3 months ago. His smoking use included Cigarettes. He has a 13.5 pack-year smoking history. He has quit using smokeless tobacco. He reports that he drinks alcohol. He reports that he uses illicit drugs.  Allergies  Allergen Reactions  . Hydrocodone Hives and Itching  . Oxycodone     Mild Itching, patient can tolerate oxycodone  . Percocet [Oxycodone-Acetaminophen]     Itching, patient says he tolerates    Family History:  Family History  Problem Relation Age of Onset  . Multiple sclerosis Mother   . Heart failure Maternal Grandfather   . Diabetes Father   . Fibromyalgia Sister   . Schizophrenia Brother   . Stroke Maternal Grandmother   . Diabetes Paternal Grandmother    Prior to Admission medications   Medication Sig Start Date End Date Taking? Authorizing Provider  albuterol (PROVENTIL HFA;VENTOLIN HFA) 108 (90 BASE) MCG/ACT inhaler Inhale 2 puffs into the lungs every 4 (four) hours as  needed (cough, shortness of breath or wheezing.). 04/30/15  Yes Stephanie D English, PA  gabapentin (NEURONTIN) 300 MG capsule Take 1 capsule by mouth 3 (three) times daily. 02/18/15  Yes Historical Provider, MD  ibuprofen (ADVIL,MOTRIN) 600 MG tablet Take 1 tablet (600 mg total) by mouth every 8 (eight) hours as needed. 06/15/15  Yes Leroy Sea, MD  lactulose (CHRONULAC) 10 GM/15ML solution Take 5-10 mLs by mouth daily as needed for mild constipation.  06/03/15  Yes Historical Provider, MD  meloxicam (MOBIC) 15 MG tablet Take 7.5 mg by mouth daily as needed for pain.    Yes Historical Provider, MD  Morphine Sulfate (MORPHINE CONCENTRATE) 10 MG/0.5ML SOLN concentrated solution Take 0.5 mLs (10 mg total) by mouth every 3 (three) hours as needed for moderate pain or severe pain. 06/15/15  Yes Leroy Sea, MD  MOVANTIK 25 MG TABS tablet take  daily 05/07/15  Yes Historical Provider, MD  oxyCODONE (OXYCONTIN) 20 mg 12 hr tablet Take 1 tablet (20 mg total) by mouth every 12 (twelve) hours. 06/12/15  Yes Leroy Sea, MD  promethazine (PHENERGAN) 25 MG tablet Take 1 tablet (25 mg total) by mouth every 8 (eight) hours as needed for nausea or vomiting. 06/04/15  Yes Thao P Le, DO  vancomycin 1,750 mg in sodium chloride 0.9 % 500  mL Inject 1,750 mg into the vein every 12 (twelve) hours. Follow with ID in 1 month, stop date 08/10/15, Home and can draw vancomycin levels to be followed by home health pharmacy and ID office of Dr.Campbell 06/12/15  Yes Leroy Sea, MD  citalopram (CELEXA) 10 MG tablet Take 1 tablet (10 mg total) by mouth daily. Reported on 05/16/2015 06/17/15   Thao P Le, DO  cyclobenzaprine (FLEXERIL) 10 MG tablet Take 1 tablet (10 mg total) by mouth 2 (two) times daily as needed for muscle spasms. Patient not taking: Reported on 06/04/2015 04/21/15   Benjiman Core, MD  HYDROmorphone (DILAUDID) 2 MG tablet Take 1 tablet (2 mg total) by mouth every 6 (six) hours as needed for severe  pain. 06/12/15   Leroy Sea, MD   Physical Exam: Filed Vitals:   06/24/15 1610 06/24/15 0923 06/24/15 1123  BP: 139/133  117/79  Pulse: 144 113 97  Temp: 98.9 F (37.2 C)    TempSrc: Oral    Resp: 18  17  SpO2: 100%  95%    Physical Exam  Constitutional: Appears well-developed and well-nourished. No distress.  HENT: Normocephalic. No tonsillar erythema or exudates Eyes: Conjunctivae  are normal. No scleral icterus.  Neck: Normal ROM. Neck supple. No JVD. No tracheal deviation. No thyromegaly.  CVS: tachycardic, S1/S2 +, no murmurs, no gallops, no carotid bruit.  Pulmonary: Effort and breath sounds normal, no stridor, rhonchi, wheezes, rales.  Abdominal: Soft. BS +,  no distension, tenderness, rebound or guarding.  Musculoskeletal: Normal range of motion. No edema and no tenderness.  Lymphadenopathy: No lymphadenopathy noted, cervical, inguinal. Neuro: Alert. Normal reflexes, muscle tone coordination. No focal neurologic deficits. Skin: Skin is warm and dry. No rash noted.  No erythema. No pallor.  Psychiatric: Normal mood and affect. Behavior, judgment, thought content normal.   Labs on Admission:  Basic Metabolic Panel:  Recent Labs Lab 06/24/15 1129  NA 139  K 4.2  CL 105  CO2 20*  GLUCOSE 84  BUN 41*  CREATININE 3.94*  CALCIUM 8.9   Liver Function Tests:  Recent Labs Lab 06/24/15 1129  AST 21  ALT 24  ALKPHOS 57  BILITOT 0.8  PROT 6.7  ALBUMIN 3.9    Recent Labs Lab 06/24/15 1129  LIPASE 24   CBC:  Recent Labs Lab 06/24/15 1129  WBC 2.2*  NEUTROABS PENDING  HGB 11.1*  HCT 33.7*  MCV 86.4  PLT PENDING   Cardiac Enzymes:  Recent Labs Lab 06/24/15 1129  CKTOTAL 27*    If 7PM-7AM, please contact night-coverage [email or web address] Password [email or web address] 06/24/2015, 12:46 PM

## 2015-06-24 NOTE — ED Notes (Addendum)
Patient is from home with a chief complaint of back pain and headache. Patient has back surgery in November and developed an infection. Patient currently has a PICC line in place where he is receiving IV vancomycin at home for bacterial meningitis. Patient has blood drawn yesterday and was called today by Dr. Yetta Barre and told to come to ER due to lab results from yesterday that showed some kidney failure. Patient has prescribed narcotics at home which he took this morning.

## 2015-06-24 NOTE — Progress Notes (Addendum)
ANTIBIOTIC CONSULT NOTE - INITIAL  Pharmacy Consult for Daptomycin Indication: bacterial meningitis  Allergies  Allergen Reactions  . Hydrocodone Hives and Itching  . Oxycodone     Mild Itching, patient can tolerate oxycodone  . Percocet [Oxycodone-Acetaminophen]     Itching, patient says he tolerates    Patient Measurements:     Vital Signs: Temp: 98.9 F (37.2 C) (01/24 0922) Temp Source: Oral (01/24 0922) BP: 139/133 mmHg (01/24 0922) Pulse Rate: 113 (01/24 0923) Intake/Output from previous day:   Intake/Output from this shift:    Labs: No results for input(s): WBC, HGB, PLT, LABCREA, CREATININE in the last 72 hours. Estimated Creatinine Clearance: 115.4 mL/min (by C-G formula based on Cr of 1.01). No results for input(s): VANCOTROUGH, VANCOPEAK, VANCORANDOM, GENTTROUGH, GENTPEAK, GENTRANDOM, TOBRATROUGH, TOBRAPEAK, TOBRARND, AMIKACINPEAK, AMIKACINTROU, AMIKACIN in the last 72 hours.   Microbiology: Recent Results (from the past 720 hour(s))  CSF culture     Status: None   Collection Time: 06/05/15 10:48 AM  Result Value Ref Range Status   Specimen Description CSF  Final   Special Requests NONE  Final   Gram Stain   Final    CYTOSPIN WBC PRESENT,BOTH PMN AND MONONUCLEAR NO ORGANISMS SEEN Gram Stain Report Called to,Read Back By and Verified With: DR KNAPP AT 1212 ON 1.5.17 BYSHUEA    Culture   Final    RARE STAPHYLOCOCCUS SPECIES (COAGULASE NEGATIVE) CRITICAL RESULT CALLED TO, READ BACK BY AND VERIFIED WITH: DR Izola Price 06/07/15 @ 0847 M VESTAL Performed at [facility]    Report Status 06/09/2015 FINAL  Final   Organism ID, Bacteria STAPHYLOCOCCUS SPECIES (COAGULASE NEGATIVE)  Final      Susceptibility   Staphylococcus species (coagulase negative) - MIC*    CIPROFLOXACIN <=0.5 SENSITIVE Sensitive     ERYTHROMYCIN >=8 RESISTANT Resistant     GENTAMICIN <=0.5 SENSITIVE Sensitive     OXACILLIN >=4 RESISTANT Resistant     TETRACYCLINE <=1 SENSITIVE  Sensitive     VANCOMYCIN 2 SENSITIVE Sensitive     TRIMETH/SULFA <=10 SENSITIVE Sensitive     CLINDAMYCIN <=0.25 SENSITIVE Sensitive     RIFAMPIN <=0.5 SENSITIVE Sensitive     Inducible Clindamycin NEGATIVE Sensitive     * RARE STAPHYLOCOCCUS SPECIES (COAGULASE NEGATIVE)  Culture, blood (routine x 2)     Status: None   Collection Time: 06/05/15  8:10 PM  Result Value Ref Range Status   Specimen Description BLOOD LEFT HAND  Final   Special Requests BOTTLES DRAWN AEROBIC ONLY 5CC  Final   Culture   Final    NO GROWTH 5 DAYS Performed at [facility]    Report Status 06/10/2015 FINAL  Final  Culture, blood (routine x 2)     Status: None   Collection Time: 06/05/15  8:15 PM  Result Value Ref Range Status   Specimen Description BLOOD LEFT HAND  Final   Special Requests IN PEDIATRIC BOTTLE 3CC  Final   Culture   Final    NO GROWTH 5 DAYS Performed at [facility]    Report Status 06/10/2015 FINAL  Final  Culture, blood (routine x 2)     Status: None   Collection Time: 06/09/15  1:15 PM  Result Value Ref Range Status   Specimen Description BLOOD RIGHT ARM  Final   Special Requests BOTTLES DRAWN AEROBIC AND ANAEROBIC  10CC  Final   Culture   Final    NO GROWTH 5 DAYS Performed at [facility]  Report Status 06/14/2015 FINAL  Final  Culture, blood (routine x 2)     Status: None   Collection Time: 06/09/15  1:20 PM  Result Value Ref Range Status   Specimen Description BLOOD RIGHT HAND  Final   Special Requests IN PEDIATRIC BOTTLE  2CC  Final   Culture   Final    NO GROWTH 5 DAYS Performed at [facility]    Report Status 06/14/2015 FINAL  Final  Culture, blood (routine x 2)     Status: None   Collection Time: 06/13/15  1:35 PM  Result Value Ref Range Status   Specimen Description BLOOD LEFT HAND  Final   Special Requests BOTTLES DRAWN AEROBIC AND ANAEROBIC 10CCS  Final   Culture NO GROWTH 5 DAYS  Final   Report Status 06/18/2015 FINAL  Final   Culture, blood (routine x 2)     Status: None   Collection Time: 06/13/15  1:40 PM  Result Value Ref Range Status   Specimen Description BLOOD LEFT ANTECUBITAL  Final   Special Requests BOTTLES DRAWN AEROBIC AND ANAEROBIC 10CCS  Final   Culture NO GROWTH 5 DAYS  Final   Report Status 06/18/2015 FINAL  Final    Medical History: Past Medical History  Diagnosis Date  . Gall stones   . Chronic lower back pain   . Anginal pain (HCC) 04/15/2014  . Chronic bronchitis (HCC)     "probably get it q yr"  . Arthritis     "wrists, knees, lower back" (04/16/2014)  . 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  He is currently on Vancomycin as outpatient with rise in Scr (1.01-->1.51-->3.7) over past 10 days.  ID physician sent him to ED to change antibiotics to Cubicin.  Tentative plan to continue antibiotics through 08/10/15.  06/24/2015:  Afebrile  WBC 2.2  Scr 3.94; CrCl (CG) <35ml/min  Total CK 27  Goal of Therapy:  Eradicate infection.  Plan:  Daptomycin 840 ( /kg) IV 48qh Monitor renal function Monitor CK weekly while on daptomycin  Kaylina Cahue, [facility] 06/24/2015,10:54 AM

## 2015-06-24 NOTE — CV Procedure (Signed)
Informed consent was obtained from the patient prior to the procedure, including potential complications of headache, allergy, and pain. With the patient prone, the lower back was prepped with Betadine. 1% Lidocaine was used for local anesthesia. Lumbar puncture was performed at the [L3-4] level using a [22] gauge needle with return of [clear] CSF with an opening pressure of [19] cm water. [Thirteen] ml of CSF were obtained for laboratory studies. The patient tolerated the procedure well and there were no apparent complications.

## 2015-06-25 ENCOUNTER — Telehealth: Payer: Self-pay | Admitting: *Deleted

## 2015-06-25 DIAGNOSIS — D72819 Decreased white blood cell count, unspecified: Secondary | ICD-10-CM

## 2015-06-25 DIAGNOSIS — R51 Headache: Secondary | ICD-10-CM

## 2015-06-25 DIAGNOSIS — B957 Other staphylococcus as the cause of diseases classified elsewhere: Secondary | ICD-10-CM

## 2015-06-25 DIAGNOSIS — D7589 Other specified diseases of blood and blood-forming organs: Secondary | ICD-10-CM

## 2015-06-25 DIAGNOSIS — T368X5A Adverse effect of other systemic antibiotics, initial encounter: Secondary | ICD-10-CM

## 2015-06-25 DIAGNOSIS — N179 Acute kidney failure, unspecified: Secondary | ICD-10-CM

## 2015-06-25 DIAGNOSIS — R6883 Chills (without fever): Secondary | ICD-10-CM

## 2015-06-25 DIAGNOSIS — Z9889 Other specified postprocedural states: Secondary | ICD-10-CM

## 2015-06-25 DIAGNOSIS — G003 Staphylococcal meningitis: Secondary | ICD-10-CM

## 2015-06-25 DIAGNOSIS — D696 Thrombocytopenia, unspecified: Secondary | ICD-10-CM

## 2015-06-25 DIAGNOSIS — R197 Diarrhea, unspecified: Secondary | ICD-10-CM

## 2015-06-25 LAB — PATHOLOGIST SMEAR REVIEW

## 2015-06-25 LAB — BASIC METABOLIC PANEL
Anion gap: 9 (ref 5–15)
BUN: 43 mg/dL — ABNORMAL HIGH (ref 6–20)
CO2: 23 mmol/L (ref 22–32)
Calcium: 8.2 mg/dL — ABNORMAL LOW (ref 8.9–10.3)
Chloride: 107 mmol/L (ref 101–111)
Creatinine, Ser: 3.76 mg/dL — ABNORMAL HIGH (ref 0.61–1.24)
GFR calc Af Amer: 22 mL/min — ABNORMAL LOW (ref >60)
GFR calc non Af Amer: 19 mL/min — ABNORMAL LOW (ref >60)
Glucose, Bld: 89 mg/dL (ref 65–99)
Potassium: 4.5 mmol/L (ref 3.5–5.1)
Sodium: 139 mmol/L (ref 135–145)

## 2015-06-25 LAB — CBC
HCT: 30.4 % — ABNORMAL LOW (ref 39.0–52.0)
Hemoglobin: 10 g/dL — ABNORMAL LOW (ref 13.0–17.0)
MCH: 28.9 pg (ref 26.0–34.0)
MCHC: 32.9 g/dL (ref 30.0–36.0)
MCV: 87.9 fL (ref 78.0–100.0)
Platelets: 81 10*3/uL — ABNORMAL LOW (ref 150–400)
RBC: 3.46 MIL/uL — ABNORMAL LOW (ref 4.22–5.81)
RDW: 12.3 % (ref 11.5–15.5)
WBC: 1.9 10*3/uL — ABNORMAL LOW (ref 4.0–10.5)

## 2015-06-25 LAB — GLUCOSE, CAPILLARY: Glucose-Capillary: 85 mg/dL (ref 65–99)

## 2015-06-25 LAB — VANCOMYCIN, TROUGH: Vancomycin Tr: 29 ug/mL (ref 10.0–20.0)

## 2015-06-25 MED ORDER — SODIUM CHLORIDE 0.9 % IV BOLUS (SEPSIS)
2000.0000 mL | Freq: Once | INTRAVENOUS | Status: AC
  Administered 2015-06-25: 2000 mL via INTRAVENOUS

## 2015-06-25 MED ORDER — PROCHLORPERAZINE EDISYLATE 5 MG/ML IJ SOLN
10.0000 mg | Freq: Four times a day (QID) | INTRAMUSCULAR | Status: DC | PRN
  Administered 2015-06-26 – 2015-06-30 (×4): 10 mg via INTRAVENOUS
  Filled 2015-06-25 (×4): qty 2

## 2015-06-25 MED ORDER — SODIUM CHLORIDE 0.9 % IV BOLUS (SEPSIS): 1000.0000 mL | Freq: Once | INTRAVENOUS | Status: DC

## 2015-06-25 MED ORDER — NALOXEGOL OXALATE 12.5 MG PO TABS: 12.5000 mg | ORAL_TABLET | Freq: Every day | ORAL | Status: DC

## 2015-06-25 NOTE — Consult Note (Addendum)
Renal Service Consult Note [facility]  Bruce Morales 06/25/2015 Maree Krabbe Requesting Physician:  Dr Janee Morn  Reason for Consult:  Acute renal failure HPI: The patient is a 40 y.o. year-old with hx of low back pain who underwent L5-S1 right hemi-laminectomy in November 2016.  Then presented in early Jan 2017 with severe worsening HA's and was admitted and found to have MRSE meningitis.  Rx with IV vanc per ID and dc'd home w PICC line to get IV vanc through March 12th. Recent blood tests showed "kidney failure" and he was told to come to ED.  IN ED yesterday the creat was 3.94. Today it is down a little at 3.76.  Has had 1L UOP since admit yesterday.  He says he was taking Motrin at home 600 mg every 8 hrs prn for HA and pain.  Also listed is Mobic prn.  Vanc dose was 1750 mg every 12 hours.    Chart review: Oct 2014 - UGIB/ hematemesis, abd pain. EGD negative, unclear source. Resolved. DC'd home. Nov 2015 - recurrent abd and chest pain > underwent heart cath showing no sig CAD. LVEF 45%. DC'd home.  Jan 5- Jun 15, 2015 > sp back surg developed wound drainage rx w OP po doxy. Drainage improved but developed increasing back pain/ hip pain and RLE pain, then severe HA's x 5 days PTA along with fevers and chills. Admitted and LP showed meningitis , CSF cultures grew MRSE.  DC'd on home IV vanc 1.75 gm every 12 hours through March 12th then reassess per ID.      ROS  no HA, no fevers  no chills  no abd pain, n/v/d  no chest pain or cough  chron back and hip pain  no nausea  has been using Motrin prn for pain  Past Medical History  Past Medical History  Diagnosis Date  . Gall stones   . Chronic lower back pain   . Anginal pain (HCC) 04/15/2014  . Chronic bronchitis (HCC)     "probably get it q yr"  . Arthritis     "wrists, knees, lower back" (04/16/2014)  . Chronic lower back pain    Past Surgical History  Past Surgical History  Procedure Laterality  Date  . Cholecystectomy  2011  . Knee arthroscopy Right 2013 X 2  . Esophagogastroduodenoscopy N/A 02/27/2013    Procedure: ESOPHAGOGASTRODUODENOSCOPY (EGD);  Surgeon: Florencia Reasons, MD;  Location: [facility];  Service: Endoscopy;  Laterality: N/A;  . Left heart catheterization with coronary angiogram N/A 04/17/2014    Procedure: LEFT HEART CATHETERIZATION WITH CORONARY ANGIOGRAM;  Surgeon: Ricki Rodriguez, MD;  Location: [facility] CATH LAB;  Service: Cardiovascular;  Laterality: N/A;  . Radiology with anesthesia N/A 06/11/2015    Procedure: MRI LUMBAR WITH AND WITHOUT CONTRAST;  Surgeon: Medication Radiologist, MD;  Location: [facility] OR;  Service: Radiology;  Laterality: N/A;   Family History  Family History  Problem Relation Age of Onset  . Multiple sclerosis Mother   . Heart failure Maternal Grandfather   . Diabetes Father   . Fibromyalgia Sister   . Schizophrenia Brother   . Stroke Maternal Grandmother   . Diabetes Paternal Grandmother    Social History  reports that he quit smoking about 3 months ago. His smoking use included Cigarettes. He has a 13.5 pack-year smoking history. He has quit using smokeless tobacco. He reports that he drinks alcohol. He reports that he uses illicit drugs. Allergies  Allergies  Allergen  Reactions  . Hydrocodone Hives and Itching  . Oxycodone     Mild Itching, patient can tolerate oxycodone  . Percocet [Oxycodone-Acetaminophen]     Itching, patient says he tolerates   Home medications Prior to Admission medications   Medication Sig Start Date End Date Taking? Authorizing Provider  albuterol (PROVENTIL HFA;VENTOLIN HFA) 108 (90 BASE) MCG/ACT inhaler Inhale 2 puffs into the lungs every 4 (four) hours as needed (cough, shortness of breath or wheezing.). 04/30/15  Yes Stephanie D English, PA  gabapentin (NEURONTIN) 300 MG capsule Take 1 capsule by mouth 3 (three) times daily. 02/18/15  Yes Historical Provider, MD  heparin lock flush 100 UNIT/ML SOLN injection  Inject 500 Units into the vein every 12 (twelve) hours. Flush every 12 hours after vancomycin infusion   Yes Historical Provider, MD  HYDROmorphone (DILAUDID) 2 MG tablet Take 1 tablet (2 mg total) by mouth every 6 (six) hours as needed for severe pain. 06/12/15  Yes Leroy Sea, MD  ibuprofen (ADVIL,MOTRIN) 600 MG tablet Take 1 tablet (600 mg total) by mouth every 8 (eight) hours as needed. 06/15/15  Yes Leroy Sea, MD  lactulose (CHRONULAC) 10 GM/15ML solution Take 5-10 mLs by mouth daily as needed for mild constipation.  06/03/15  Yes Historical Provider, MD  meloxicam (MOBIC) 15 MG tablet Take 7.5 mg by mouth daily as needed for pain.    Yes Historical Provider, MD  Morphine Sulfate (MORPHINE CONCENTRATE) 10 MG/0.5ML SOLN concentrated solution Take 0.5 mLs (10 mg total) by mouth every 3 (three) hours as needed for moderate pain or severe pain. 06/15/15  Yes Leroy Sea, MD  MOVANTIK 25 MG TABS tablet take  daily 05/07/15  Yes Historical Provider, MD  oxyCODONE (OXYCONTIN) 20 mg 12 hr tablet Take 1 tablet (20 mg total) by mouth every 12 (twelve) hours. 06/12/15  Yes Leroy Sea, MD  promethazine (PHENERGAN) 25 MG tablet Take 1 tablet (25 mg total) by mouth every 8 (eight) hours as needed for nausea or vomiting. 06/04/15  Yes Thao P Le, DO  vancomycin 1,750 mg in sodium chloride 0.9 % 500 mL Inject 1,750 mg into the vein every 12 (twelve) hours. Follow with ID in 1 month, stop date 08/10/15, Home and can draw vancomycin levels to be followed by home health pharmacy and ID office of Dr.Campbell 06/12/15  Yes Leroy Sea, MD   Liver Function Tests  Recent Labs Lab 06/24/15 1129 06/24/15 1347  AST 21 20  ALT 24 23  ALKPHOS 57 53  BILITOT 0.8 0.9  PROT 6.7 6.3*  ALBUMIN 3.9 3.6    Recent Labs Lab 06/24/15 1129  LIPASE 24   CBC  Recent Labs Lab 06/24/15 1129 06/24/15 1347 06/25/15 0425  WBC 2.2* 2.0* 1.9*  NEUTROABS 1.3* 1.1*  --   HGB 11.1* 10.1* 10.0*  HCT  33.7* 29.5* 30.4*  MCV 86.4 84.3 87.9  PLT 91* 88* 81*   Basic Metabolic Panel  Recent Labs Lab 06/24/15 1129 06/24/15 1347 06/25/15 0425  NA 139 141 139  K 4.2 4.3 4.5  CL 105 109 107  CO2 20* 20* 23  GLUCOSE 84 84 89  BUN 41* 41* 43*  CREATININE 3.94* 3.86* 3.76*  CALCIUM 8.9 8.5* 8.2*  PHOS  --  4.8*  --     Filed Vitals:   06/24/15 1732 06/24/15 2136 06/25/15 0530 06/25/15 1413  BP: 129/65 139/78 134/75 131/72  Pulse: 82 96 86 82  Temp: 99.4 F (37.4 C)  99 F (37.2 C) 99.3 F (37.4 C) 99.2 F (37.3 C)  TempSrc: Oral Oral Oral Oral  Resp: Height:  (1.702 m)     Weight: 94.6 kg (208 lb 8.9 oz)  94.3 kg (207 lb 14.3 oz)   SpO2: 98% 97% 97% 95%   Exam Alert obese, no distress No rash, cyanosis or gangrene Sclera anicteric, throat clear No jvd Chest clear bilat RRR soft 1/6 sem no RG abd obese soft mild RUQ tenderness +bs no ascites GU normal male MS no joint effusion Back scar intact, no drainage or open wounds Ext trace LE edema, no UE edema PICC RUE intact, clean and dry exit Neuro is alert Ox 3  UA negative 1.014 UNa 70  UCr 160.98 Renal US no hydro, 2 kidneys normal in size / appearance  Assessment: 1 Acute renal failure - most likely ATN from vanc toxicity. Was taking nsaid's as well.  Plan supportive care, agree with IVF's 125/ hr, has room for volume. Will bolus 2 liters.  NO NSAID"S.  Will follow 2 MRSE meningitis - complication of back surg 3 L-S decompression back surg Nov 2016   Plan - as above  Vinson Moselle MD [facility] pager [phone]    cell [phone] 06/25/2015, 5:35 PM

## 2015-06-25 NOTE — Telephone Encounter (Signed)
Notified [facility] that pt was admitted to [facility], [facility] is discharging the pt due to hospitalization.

## 2015-06-25 NOTE — Progress Notes (Signed)
[facility] PROGRESS NOTE  JADAVION SPOELSTRA ZOX:[id number] DOB: Feb 05, 1976 DOA: 06/24/2015 PCP: Rockne Coons, DO  Assessment/Plan: #1 acute renal failure Likely secondary to vancomycin toxicity. Vanc trough levels elevated at 29. Patient states has good urine output. Renal ultrasound negative for hydronephrosis. Continue IV fluids. Nephrology consultation pending.  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 LP with lymphocytic pleocytosis. Fungal cultures of an added to CSF. Patient with history of postsurgical lumbar surgery infection. IV vancomycin been discontinued. Continue IV daptomycin per ID recommendations to 07/17/2015. Pain management.  #3 thrombocytopenia/leukopenia Likely secondary to vancomycin. Patient with no bleeding. Vancomycin trough levels at 29. Follow.  #4 diarrhea Patient was on stool softeners/laxatives. Discontinue laxative. Follow for now.    Code Status: Full Family Communication: Updated patient and family at bedside. Disposition Plan: Home once renal function back to baseline and improvement with headache.   Consultants:  Infectious diseases: Dr. Drue Second 06/24/2015  Nephrology pending  Procedures:  Lumbar puncture 06/24/2015  Renal ultrasound 06/24/2015  Antibiotics:  IV daptomycin 06/24/2015  HPI/Subjective: Patient complaining of headache about a 5/10. Patient states has had 3 loose stools. Patient complaining of back pain.  Objective: Filed Vitals:   06/25/15 0530 06/25/15 1413  BP: 134/75 131/72  Pulse: 86 82  Temp: 99.3 F (37.4 C) 99.2 F (37.3 C)  Resp: 20 16    Intake/Output Summary (Last 24 hours) at 06/25/15 1518 Last data filed at 06/25/15 1300  Gross per 24 hour  Intake 1309.3 ml  Output   1000 ml  Net  309.3 ml   Filed Weights   06/24/15 1732 06/25/15 0530  Weight: 94.6 kg (208 lb 8.9 oz) 94.3 kg (207 lb 14.3 oz)    Exam:   General:   NAD  Cardiovascular: RRR  Respiratory: CTAB  Abdomen: Soft/NT/ND/+BS  Musculoskeletal:  No clubbing, cyanosis, edema.  Data Reviewed: Basic Metabolic Panel:  Recent Labs Lab 06/24/15 1129 06/24/15 1347 06/25/15 0425  NA 139 141 139  K 4.2 4.3 4.5  CL 105 109 107  CO2 20* 20* 23  GLUCOSE 84 84 89  BUN 41* 41* 43*  CREATININE 3.94* 3.86* 3.76*  CALCIUM 8.9 8.5* 8.2*  MG  --  1.8  --   PHOS  --  4.8*  --    Liver Function Tests:  Recent Labs Lab 06/24/15 1129 06/24/15 1347  AST 21 20  ALT 24 23  ALKPHOS 57 53  BILITOT 0.8 0.9  PROT 6.7 6.3*  ALBUMIN 3.9 3.6    Recent Labs Lab 06/24/15 1129  LIPASE 24   No results for input(s): AMMONIA in the last 168 hours. CBC:  Recent Labs Lab 06/24/15 1129 06/24/15 1347 06/25/15 0425  WBC 2.2* 2.0* 1.9*  NEUTROABS 1.3* 1.1*  --   HGB 11.1* 10.1* 10.0*  HCT 33.7* 29.5* 30.4*  MCV 86.4 84.3 87.9  PLT 91* 88* 81*   Cardiac Enzymes:  Recent Labs Lab 06/24/15 1129  CKTOTAL 27*   BNP (last 3 results) No results for input(s): BNP in the last 8760 hours.  ProBNP (last 3 results) No results for input(s): PROBNP in the last 8760 hours.  CBG:  Recent Labs Lab 06/25/15 0747  GLUCAP 85    Recent Results (from the past 240 hour(s))  Culture, blood (routine x 2)     Status: None (Preliminary result)   Collection Time: 06/24/15 11:20 AM  Result Value Ref Range Status   Specimen Description BLOOD PICC LINE  Final   Special Requests BOTTLES DRAWN AEROBIC AND ANAEROBIC 5CC  Final   Culture   Final    NO GROWTH < 24 HOURS Performed at [facility]    Report Status PENDING  Incomplete  Culture, blood (routine x 2)     Status: None (Preliminary result)   Collection Time: 06/24/15 11:29 AM  Result Value Ref Range Status   Specimen Description BLOOD LEFT HAND  Final   Special Requests BOTTLES DRAWN AEROBIC AND ANAEROBIC  Final   Culture   Final    NO GROWTH < 24 HOURS Performed at [facility]    Report Status PENDING  Incomplete  CSF culture     Status: None (Preliminary result)   Collection Time: 06/24/15  2:59 PM  Result Value Ref Range Status   Specimen Description CSF  Final   Special Requests NONE  Final   Gram Stain   Final    CYTOSPIN SMEAR WBC PRESENT, PREDOMINANTLY MONONUCLEAR NO ORGANISMS SEEN Gram Stain Report Called to,Read Back By and Verified With: MARTHA HAMBY RN 1.24.17 @ 1650 BY RICEJ    Culture   Final    NO GROWTH < 24 HOURS Performed at [facility]    Report Status PENDING  Incomplete  Urine culture     Status: None (Preliminary result)   Collection Time: 06/24/15  8:45 PM  Result Value Ref Range Status   Specimen Description URINE, RANDOM  Final   Special Requests NONE  Final   Culture   Final    NO GROWTH < 24 HOURS Performed at [facility]    Report Status PENDING  Incomplete     Studies: US Renal  06/24/2015  CLINICAL DATA:  Initial evaluation for acute renal failure. EXAM: RENAL / URINARY TRACT ULTRASOUND COMPLETE COMPARISON:  Prior study from 04/14/2014. FINDINGS: Right Kidney: Length: 13.4 cm. Echogenicity within normal limits. No mass or hydronephrosis visualized. Left Kidney: Length: 14.4 cm. Echogenicity within normal limits. No mass or hydronephrosis visualized. Bladder: Appears normal for degree of bladder distention. 295 cc of prevoid volume present. IMPRESSION: Normal sonographic appearance of the kidneys.  No hydronephrosis. Electronically Signed   By: Rise Mu M.D.   On: 06/24/2015 20:13   Dg Lumbar Puncture Fluoro Guide  06/24/2015  CLINICAL DATA:  Bacterial meningitis. EXAM: DIAGNOSTIC LUMBAR PUNCTURE UNDER FLUOROSCOPIC GUIDANCE FLUOROSCOPY TIME:  Radiation Exposure Index (as provided by the fluoroscopic device): If the device does not provide the exposure index: Fluoroscopy Time (in minutes and seconds): Number of Acquired Images:  None PROCEDURE: Informed consent was obtained from the patient  prior to the procedure, including potential complications of headache, allergy, and pain. With the patient prone, the lower back was prepped with Betadine. 1% Lidocaine was used for local anesthesia. Lumbar puncture was performed at the L3-4 level using a 22 gauge needle with return of clear CSF with an opening pressure of 19 cm water. Thirteen ml of CSF were obtained for laboratory studies. The patient tolerated the procedure well and there were no apparent complications. IMPRESSION: 1. Technically successful diagnostic lumbar puncture. Electronically Signed   By: Signa Kell M.D.   On: 06/24/2015 15:54    Scheduled Meds: . citalopram  10 mg Oral Daily  . DAPTOmycin (CUBICIN)  IV  840 mg Intravenous Q48H  . gabapentin  300 mg Oral TID  . [START ON 06/26/2015] naloxegol oxalate  12.5 mg Oral Daily  . oxyCODONE  20 mg Oral Q12H  .  sodium chloride flush  10-40 mL Intracatheter Q12H  . sodium chloride flush  3 mL Intravenous Q12H   Continuous Infusions: . sodium chloride 75 mL/hr at 06/24/15 1401    Principal Problem:   Acute renal failure (ARF) (HCC) Active Problems:   Bilateral low back pain without sciatica   Morbid obesity (HCC)   Headache   Recent history of Staphylococcal meningitis   Status post lumbar laminectomy   Pancytopenia (HCC)    Time spent: 40 minutes    Nishi Neiswonger M.D. [facility] Pager [phone]. If 7PM-7AM, please contact night-coverage at [email or web address], password [facility] 06/25/2015, 3:18 PM  LOS: 1 day

## 2015-06-25 NOTE — Progress Notes (Signed)
[facility]    Date of Admission:  06/24/2015   Total days of antibiotics 21        Day 2 dapto QOD           ID: Bruce Morales is a 40 y.o. 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  Principal Problem:   Acute renal failure (ARF) (HCC) Active Problems:   Bilateral low back pain without sciatica   Morbid obesity (HCC)   Headache   Recent history of Staphylococcal meningitis   Status post lumbar laminectomy   Pancytopenia (HCC)    Subjective: Afebrile, he reports having 3 loose stools today, occ watery with yellowish mucous. Also had urgency but was not able to defecate. He does state that he had difficulty with constipation on prior admission and has been taking stool softeners, homeopathic teas to help with regular bowel regimen. Still has headache. He denies taking mobic prior to admit  Medications:  . citalopram  10 mg Oral Daily  . DAPTOmycin (CUBICIN)  IV  840 mg Intravenous Q48H  . gabapentin  300 mg Oral TID  . [START ON 06/26/2015] naloxegol oxalate  12.5 mg Oral Daily  . oxyCODONE  20 mg Oral Q12H  . sodium chloride flush  10-40 mL Intracatheter Q12H  . sodium chloride flush  3 mL Intravenous Q12H    Objective: Vital signs in last 24 hours: Temp:  [99 F (37.2 C)-99.4 F (37.4 C)] 99.2 F (37.3 C) (01/25 1413) Pulse Rate:  [82-96] 82 (01/25 1413) Resp:  [16-20] 16 (01/25 1413) BP: (129-139)/(65-78) 131/72 mmHg (01/25 1413) SpO2:  [95 %-98 %] 95 % (01/25 1413) Weight:  [207 lb 14.3 oz (94.3 kg)-208 lb 8.9 oz (94.6 kg)] 207 lb 14.3 oz (94.3 kg) (01/25 0530) Physical Exam  Constitutional: He is oriented to person, place, and time. He appears well-developed and well-nourished. No distress.  HENT:  Mouth/Throat: Oropharynx is clear and moist. No oropharyngeal exudate.  Cardiovascular: Normal rate, regular rhythm and normal heart  sounds. Exam reveals no gallop and no friction rub.  No murmur heard.  Pulmonary/Chest: Effort normal and breath sounds normal. No respiratory distress. He has no wheezes.  Abdominal: Soft. Bowel sounds are normal. He exhibits no distension. There is no tenderness.  Lymphadenopathy:  He has no cervical adenopathy.  Neurological: He is alert and oriented to person, place, and time.  Skin: Skin is warm and dry. No rash noted. No erythema.  Psychiatric: He has a normal mood and affect. His behavior is normal.     Lab Results  Recent Labs  06/24/15 1347 06/25/15 0425  WBC 2.0* 1.9*  HGB 10.1* 10.0*  HCT 29.5* 30.4*  NA 141 139  K 4.3 4.5  CL 109 107  CO2 20* 23  BUN 41* 43*  CREATININE 3.86* 3.76*   Liver Panel  Recent Labs  06/24/15 1129 06/24/15 1347  PROT 6.7 6.3*  ALBUMIN 3.9 3.6  AST 21 20  ALT 24 23  ALKPHOS 57 53  BILITOT 0.8 0.9    Microbiology: 1/24 csf NGTD 1/24 blood cx x 2 NGTD  Studies/Results: US Renal  06/24/2015  CLINICAL DATA:  Initial evaluation for acute renal failure. EXAM: RENAL / URINARY TRACT ULTRASOUND COMPLETE COMPARISON:  Prior study from 04/14/2014. FINDINGS: Right Kidney: Length: 13.4 cm. Echogenicity within normal limits. No mass or hydronephrosis visualized. Left Kidney: Length: 14.4 cm. Echogenicity  within normal limits. No mass or hydronephrosis visualized. Bladder: Appears normal for degree of bladder distention. 295 cc of prevoid volume present. IMPRESSION: Normal sonographic appearance of the kidneys.  No hydronephrosis. Electronically Signed   By: Rise Mu M.D.   On: 06/24/2015 20:13   Dg Lumbar Puncture Fluoro Guide  06/24/2015  CLINICAL DATA:  Bacterial meningitis. EXAM: DIAGNOSTIC LUMBAR PUNCTURE UNDER FLUOROSCOPIC GUIDANCE FLUOROSCOPY TIME:  Radiation Exposure Index (as provided by the fluoroscopic device): If the device does not provide the exposure index: Fluoroscopy Time (in minutes and seconds): Number of Acquired  Images:  None PROCEDURE: Informed consent was obtained from the patient prior to the procedure, including potential complications of headache, allergy, and pain. With the patient prone, the lower back was prepped with Betadine. 1% Lidocaine was used for local anesthesia. Lumbar puncture was performed at the L3-4 level using a 22 gauge needle with return of clear CSF with an opening pressure of 19 cm water. Thirteen ml of CSF were obtained for laboratory studies. The patient tolerated the procedure well and there were no apparent complications. IMPRESSION: 1. Technically successful diagnostic lumbar puncture. Electronically Signed   By: Signa Kell M.D.   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 Will need baseline CK and weekly CK while on daptomycin. Still plan to take til Jul 17, 2015.  AKI from vancomycin toxicity = FENA calculated at 1.2%, he had UOP last night. recommend to give IVF @ 144mL-125ml/hr x 24 hr. Likely component of pre-renal since he had poor oral intake prior to admit. Secondly also to see if some of injury is reversible. Recommend renal consultation to see that nothing is missed. Renal ultrasound shows no obstruction. Does adjust  Leukopenia/thrombocytopenia = probably related to vancomycin(still supratherapeutic level of 29). Should improve with time as he is off the medication  Chills = blood cx are pending to see if any secondary bloodstream infection.  Lymphocytic pleocytosis in CSF = will add fungal culture to CSF to ensure not missing any pathogen. Anticipated shift from neutrophilic to lymphocytic is anticipated as well as normalization of elevated protein. For now, keep on dapto.  Diarrhea = will continue to monitor. Vancomycin not necessarily associated with c.difficile. Will d/c stool softener/laxatives  Headache = defer to Dr. Janee Morn for pain  management. i suspect he has high tolerance given he was on oxycontin  Alawna Graybeal, [facility] Cell: [phone] Pager: [phone]  06/25/2015, 3:18 PM

## 2015-06-25 NOTE — Progress Notes (Signed)
Patient refusing gabapentin. Pt stated "I can't take this. It makes my body shake involuntarily." Will continue to monitor closely.

## 2015-06-26 LAB — CBC
HCT: 28.2 % — ABNORMAL LOW (ref 39.0–52.0)
Hemoglobin: 9.2 g/dL — ABNORMAL LOW (ref 13.0–17.0)
MCH: 28.8 pg (ref 26.0–34.0)
MCHC: 32.6 g/dL (ref 30.0–36.0)
MCV: 88.1 fL (ref 78.0–100.0)
Platelets: 69 10*3/uL — ABNORMAL LOW (ref 150–400)
RBC: 3.2 MIL/uL — ABNORMAL LOW (ref 4.22–5.81)
RDW: 12.2 % (ref 11.5–15.5)
WBC: 2 10*3/uL — ABNORMAL LOW (ref 4.0–10.5)

## 2015-06-26 LAB — BASIC METABOLIC PANEL
Anion gap: 9 (ref 5–15)
BUN: 33 mg/dL — ABNORMAL HIGH (ref 6–20)
CO2: 22 mmol/L (ref 22–32)
Calcium: 8.3 mg/dL — ABNORMAL LOW (ref 8.9–10.3)
Chloride: 111 mmol/L (ref 101–111)
Creatinine, Ser: 3.28 mg/dL — ABNORMAL HIGH (ref 0.61–1.24)
GFR calc Af Amer: 26 mL/min — ABNORMAL LOW (ref >60)
GFR calc non Af Amer: 22 mL/min — ABNORMAL LOW (ref >60)
Glucose, Bld: 86 mg/dL (ref 65–99)
Potassium: 4.3 mmol/L (ref 3.5–5.1)
Sodium: 142 mmol/L (ref 135–145)

## 2015-06-26 LAB — URINE CULTURE: Culture: NO GROWTH

## 2015-06-26 LAB — GLUCOSE, CAPILLARY: Glucose-Capillary: 84 mg/dL (ref 65–99)

## 2015-06-26 MED ORDER — SODIUM CHLORIDE 0.9 % IV SOLN
840.0000 mg | INTRAVENOUS | Status: DC
  Administered 2015-06-26: 840 mg via INTRAVENOUS
  Filled 2015-06-26 (×2): qty 16.8

## 2015-06-26 NOTE — Care Management Note (Signed)
Case Management Note  Patient Details  Name: ESTER MABE MRN: [id number] Date of Birth: 01/16/1976  Subjective/Objective:                  AKI Action/Plan: Discharge Planning Expected Discharge Date:   (unknown)               Expected Discharge Plan:  Home w Home Health Services  In-House Referral:     Discharge planning Services  CM Consult  Post Acute Care Choice:  Resumption of Svcs/PTA Provider Choice offered to:     DME Arranged:    DME Agency:     HH Arranged:  RN, IV Antibiotics HH Agency:  Other - See comment  Status of Service:     Medicare Important Message Given:    Date Medicare IM Given:    Medicare IM give by:    Date Additional Medicare IM Given:    Additional Medicare Important Message give by:     If discussed at Long Length of Stay Meetings, dates discussed:    Additional Comments: Pt is active with  [facility] RN for IV ABX (Vanc).  [facility]  pharmacy contact is  Marylene Land [phone]) . Per progression mtg with pharmacy, pt will probably go home on different IV ABX. [facility] will need new orders and SOC time. CM will continue to follow for progression.        Yves Dill, RN 06/26/2015, 1:53 PM

## 2015-06-26 NOTE — Progress Notes (Addendum)
Pharmacy Antibiotic Follow-up Note  Bruce Morales is a 40 y.o. year-old male admitted on 06/24/2015.  The patient is currently on daptomycin for MRSE meningitis. He was receiving vancomycin as outpatient but developed AKI so admitted and changed to daptomycin. ID and nephrology is following.  Renal function is improving.  Vancomycin level remained supratherapeutic as of 1/25  Assessment/Plan:  Change daptomycin to  ( /kg) IV q24h for CrCl now > 23ml/min  Continue to monitor renal function and weekly CK (baseline CK 27)   Temp (24hrs), Avg:99 F (37.2 C), Min:98.8 F (37.1 C), Max:99.2 F (37.3 C)   Recent Labs Lab 06/24/15 1129 06/24/15 1347 06/25/15 0425 06/26/15 0425  WBC 2.2* 2.0* 1.9* 2.0*    Recent Labs Lab 06/24/15 1129 06/24/15 1347 06/25/15 0425 06/26/15 0425  CREATININE 3.94* 3.86* 3.76* 3.28*   Estimated Creatinine Clearance: 33.9 mL/min (by C-G formula based on Cr of 3.28).    Allergies  Allergen Reactions  . Hydrocodone Hives and Itching  . Oxycodone     Mild Itching, patient can tolerate oxycodone  . Percocet [Oxycodone-Acetaminophen]     Itching, patient says he tolerates    Antimicrobials this admission: 1/5 >> Ceftriaxone >>1/10  1/5 >> Vancomycin >> 1.24  1/5 >> Acyclovir >> 1/8 1/24 >> daptomycin >>   Levels/dose changes this admission: 1/25 0850 random vanco = 55mcg/ml  Microbiology results: 1/5 BCx: NGTD 1/5 CSF culture: CoNS 1/24 urine: NG 1/24 CSF: NGTD (Nothing seen on GS) 1/24 blood: NGTD 1/24 CDF fungus: pending  Thank you for allowing pharmacy to be a part of this patient's care.  Juliette Alcide, PharmD, BCPS.   Pager: [phone] 06/26/2015 7:13 AM

## 2015-06-26 NOTE — Progress Notes (Signed)
  [facility] Progress Note   Subjective: no issues ovenright  Filed Vitals:   06/25/15 0530 06/25/15 1413 06/25/15 2235 06/26/15 0635  BP: 134/75 131/72 132/84 127/80  Pulse: 86 82 77 72  Temp: 99.3 F (37.4 C) 99.2 F (37.3 C) 98.9 F (37.2 C) 98.8 F (37.1 C)  TempSrc: Oral Oral Oral Oral  Resp: Height:      Weight: 94.3 kg (207 lb 14.3 oz)   95.3 kg (210 lb 1.6 oz)  SpO2: 97% 95% 96% 95%    Inpatient medications: . citalopram  10 mg Oral Daily  . DAPTOmycin (CUBICIN)  IV  840 mg Intravenous Q24H  . oxyCODONE  20 mg Oral Q12H  . sodium chloride flush  10-40 mL Intracatheter Q12H  . sodium chloride flush  3 mL Intravenous Q12H   . sodium chloride 125 mL/hr at 06/26/15 1433   acetaminophen **OR** acetaminophen, albuterol, HYDROmorphone (DILAUDID) injection, HYDROmorphone, lactulose, morphine CONCENTRATE, prochlorperazine, promethazine, sodium chloride flush  Exam: Alert obese, no distress No jvd Chest clear bilat RRR soft 1/6 sem no RG Abd obese soft mild RUQ tenderness +bs no  Ext no edema PICC RUE intact, clean and dry exit Neuro is alert Ox 3  UA negative 1.014 UNa 70 UCr 160.98 Renal US no hydro, 2 kidneys normal in size / appearance  Assessment: 1 Acute renal failure - due to vanc/ nsaid's.  Creat down 3.28, off of vanc for good.  Avoiding nephrotoxins/ nsaids'/etc.  Cont IVF"s.  2 MRSE meningitis - complication of back surg 3 L-S decompression back surg Nov 2016  Plan - as above.    Vinson Moselle MD [facility] pager [phone]    cell [phone] 06/26/2015, 2:46 PM    Recent Labs Lab 06/24/15 1347 06/25/15 0425 06/26/15 0425  NA 141 139 142  K 4.3 4.5 4.3  CL 109 107 111  CO2 20* 23 22  GLUCOSE 84 89 86  BUN 41* 43* 33*  CREATININE 3.86* 3.76* 3.28*  CALCIUM 8.5* 8.2* 8.3*  PHOS 4.8*  --   --     Recent Labs Lab 06/24/15 1129 06/24/15 1347  AST 21 20  ALT 24 23  ALKPHOS 57 53  BILITOT  0.8 0.9  PROT 6.7 6.3*  ALBUMIN 3.9 3.6    Recent Labs Lab 06/24/15 1129 06/24/15 1347 06/25/15 0425 06/26/15 0425  WBC 2.2* 2.0* 1.9* 2.0*  NEUTROABS 1.3* 1.1*  --   --   HGB 11.1* 10.1* 10.0* 9.2*  HCT 33.7* 29.5* 30.4* 28.2*  MCV 86.4 84.3 87.9 88.1  PLT 91* 88* 81* 69*

## 2015-06-26 NOTE — Progress Notes (Signed)
[facility] PROGRESS NOTE  Bruce Morales ZOX:[id number] DOB: 1976/05/09 DOA: 06/24/2015 PCP: Rockne Coons, DO  Assessment/Plan: #1 acute renal failure Likely secondary to vancomycin toxicity. Vanc trough levels elevated at 29. Patient states has good urine output. Renal function slowly trending down. Renal ultrasound negative for hydronephrosis. Continue IV fluids. Nephrology following.  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 LP with lymphocytic pleocytosis. Fungal cultures added to CSF. Patient with history of postsurgical lumbar surgery infection. IV vancomycin been discontinued. Continue IV daptomycin per ID recommendations to 07/17/2015. Pain management.  #3 thrombocytopenia/leukopenia Likely secondary to vancomycin. Patient with no bleeding. Vancomycin trough levels at 29. Follow.  #4 diarrhea Patient was on stool softeners/laxatives. Discontinued laxative. Follow for now.    Code Status: Full Family Communication: Updated patient and family at bedside. Disposition Plan: Home once renal function back to baseline and improvement with headache.   Consultants:  Infectious diseases: Dr. Drue Second 06/24/2015  Nephrology Dr Arta Silence 06/25/2015  Procedures:  Lumbar puncture 06/24/2015  Renal ultrasound 06/24/2015  Antibiotics:  IV daptomycin 06/24/2015  HPI/Subjective: Patient complaining of headache about a 5/10. Patient states has had 1 loose stools. Patient complaining of back pain.  Objective: Filed Vitals:   06/26/15 0635 06/26/15 1450  BP: 127/80 137/75  Pulse: 72 76  Temp: 98.8 F (37.1 C) 99.2 F (37.3 C)  Resp: 20 18    Intake/Output Summary (Last 24 hours) at 06/26/15 1544 Last data filed at 06/26/15 1500  Gross per 24 hour  Intake 3096.8 ml  Output   1165 ml  Net 1931.8 ml   Filed Weights   06/24/15 1732 06/25/15 0530 06/26/15 0635  Weight: 94.6 kg (208 lb 8.9 oz) 94.3 kg (207 lb  14.3 oz) 95.3 kg (210 lb 1.6 oz)    Exam:   General:  NAD  Cardiovascular: RRR  Respiratory: CTAB  Abdomen: Soft/NT/ND/+BS  Musculoskeletal:  No clubbing, cyanosis, edema.  Data Reviewed: Basic Metabolic Panel:  Recent Labs Lab 06/24/15 1129 06/24/15 1347 06/25/15 0425 06/26/15 0425  NA 139 141 139 142  K 4.2 4.3 4.5 4.3  CL 105 109 107 111  CO2 20* 20* 23 22  GLUCOSE 84 84 89 86  BUN 41* 41* 43* 33*  CREATININE 3.94* 3.86* 3.76* 3.28*  CALCIUM 8.9 8.5* 8.2* 8.3*  MG  --  1.8  --   --   PHOS  --  4.8*  --   --    Liver Function Tests:  Recent Labs Lab 06/24/15 1129 06/24/15 1347  AST 21 20  ALT 24 23  ALKPHOS 57 53  BILITOT 0.8 0.9  PROT 6.7 6.3*  ALBUMIN 3.9 3.6    Recent Labs Lab 06/24/15 1129  LIPASE 24   No results for input(s): AMMONIA in the last 168 hours. CBC:  Recent Labs Lab 06/24/15 1129 06/24/15 1347 06/25/15 0425 06/26/15 0425  WBC 2.2* 2.0* 1.9* 2.0*  NEUTROABS 1.3* 1.1*  --   --   HGB 11.1* 10.1* 10.0* 9.2*  HCT 33.7* 29.5* 30.4* 28.2*  MCV 86.4 84.3 87.9 88.1  PLT 91* 88* 81* 69*   Cardiac Enzymes:  Recent Labs Lab 06/24/15 1129  CKTOTAL 27*   BNP (last 3 results) No results for input(s): BNP in the last 8760 hours.  ProBNP (last 3 results) No results for input(s): PROBNP in the last 8760 hours.  CBG:  Recent Labs Lab 06/25/15 0747 06/26/15 0743  GLUCAP 85 84    Recent Results (  from the past 240 hour(s))  Culture, blood (routine x 2)     Status: None (Preliminary result)   Collection Time: 06/24/15 11:20 AM  Result Value Ref Range Status   Specimen Description BLOOD PICC LINE  Final   Special Requests BOTTLES DRAWN AEROBIC AND ANAEROBIC 5CC  Final   Culture   Final    NO GROWTH 2 DAYS Performed at [facility]    Report Status PENDING  Incomplete  Culture, blood (routine x 2)     Status: None (Preliminary result)   Collection Time: 06/24/15 11:29 AM  Result Value Ref Range Status    Specimen Description BLOOD LEFT HAND  Final   Special Requests BOTTLES DRAWN AEROBIC AND ANAEROBIC  Final   Culture   Final    NO GROWTH 2 DAYS Performed at [facility]    Report Status PENDING  Incomplete  CSF culture     Status: None (Preliminary result)   Collection Time: 06/24/15  2:59 PM  Result Value Ref Range Status   Specimen Description CSF  Final   Special Requests NONE  Final   Gram Stain   Final    CYTOSPIN SMEAR WBC PRESENT, PREDOMINANTLY MONONUCLEAR NO ORGANISMS SEEN Gram Stain Report Called to,Read Back By and Verified With: MARTHA HAMBY RN 1.24.17 @ 1650 BY RICEJ    Culture   Final    NO GROWTH 2 DAYS Performed at [facility]    Report Status PENDING  Incomplete  Urine culture     Status: None   Collection Time: 06/24/15  8:45 PM  Result Value Ref Range Status   Specimen Description URINE, RANDOM  Final   Special Requests NONE  Final   Culture   Final    NO GROWTH 2 DAYS Performed at [facility]    Report Status 06/26/2015 FINAL  Final     Studies: US Renal  06/24/2015  CLINICAL DATA:  Initial evaluation for acute renal failure. EXAM: RENAL / URINARY TRACT ULTRASOUND COMPLETE COMPARISON:  Prior study from 04/14/2014. FINDINGS: Right Kidney: Length: 13.4 cm. Echogenicity within normal limits. No mass or hydronephrosis visualized. Left Kidney: Length: 14.4 cm. Echogenicity within normal limits. No mass or hydronephrosis visualized. Bladder: Appears normal for degree of bladder distention. 295 cc of prevoid volume present. IMPRESSION: Normal sonographic appearance of the kidneys.  No hydronephrosis. Electronically Signed   By: Rise Mu M.D.   On: 06/24/2015 20:13    Scheduled Meds: . citalopram  10 mg Oral Daily  . DAPTOmycin (CUBICIN)  IV  840 mg Intravenous Q24H  . oxyCODONE  20 mg Oral Q12H  . sodium chloride flush  10-40 mL Intracatheter Q12H  . sodium chloride flush  3 mL Intravenous Q12H   Continuous  Infusions: . sodium chloride 125 mL/hr at 06/26/15 1433    Principal Problem:   Acute renal failure (ARF) (HCC) Active Problems:   Bilateral low back pain without sciatica   Morbid obesity (HCC)   Headache   Recent history of Staphylococcal meningitis   Status post lumbar laminectomy   Pancytopenia (HCC)    Time spent: 40 minutes    THOMPSON,DANIEL M.D. [facility] Pager [phone]. If 7PM-7AM, please contact night-coverage at [email or web address], password [facility] 06/26/2015, 3:44 PM  LOS: 2 days

## 2015-06-27 DIAGNOSIS — K5900 Constipation, unspecified: Secondary | ICD-10-CM

## 2015-06-27 DIAGNOSIS — Z0271 Encounter for disability determination: Secondary | ICD-10-CM

## 2015-06-27 DIAGNOSIS — M545 Low back pain: Secondary | ICD-10-CM

## 2015-06-27 DIAGNOSIS — D696 Thrombocytopenia, unspecified: Secondary | ICD-10-CM | POA: Diagnosis present

## 2015-06-27 DIAGNOSIS — D7589 Other specified diseases of blood and blood-forming organs: Secondary | ICD-10-CM | POA: Diagnosis present

## 2015-06-27 LAB — BASIC METABOLIC PANEL
Anion gap: 10 (ref 5–15)
BUN: 24 mg/dL — ABNORMAL HIGH (ref 6–20)
CO2: 23 mmol/L (ref 22–32)
Calcium: 8.5 mg/dL — ABNORMAL LOW (ref 8.9–10.3)
Chloride: 108 mmol/L (ref 101–111)
Creatinine, Ser: 3.16 mg/dL — ABNORMAL HIGH (ref 0.61–1.24)
GFR calc Af Amer: 27 mL/min — ABNORMAL LOW (ref >60)
GFR calc non Af Amer: 24 mL/min — ABNORMAL LOW (ref >60)
Glucose, Bld: 86 mg/dL (ref 65–99)
Potassium: 4.1 mmol/L (ref 3.5–5.1)
Sodium: 141 mmol/L (ref 135–145)

## 2015-06-27 LAB — MAGNESIUM: Magnesium: 1.6 mg/dL — ABNORMAL LOW (ref 1.7–2.4)

## 2015-06-27 LAB — CBC WITH DIFFERENTIAL/PLATELET
Basophils Absolute: 0 10*3/uL (ref 0.0–0.1)
Basophils Relative: 0 %
Eosinophils Absolute: 0.1 10*3/uL (ref 0.0–0.7)
Eosinophils Relative: 6 %
HCT: 28 % — ABNORMAL LOW (ref 39.0–52.0)
Hemoglobin: 9.3 g/dL — ABNORMAL LOW (ref 13.0–17.0)
Lymphocytes Relative: 32 %
Lymphs Abs: 0.7 10*3/uL (ref 0.7–4.0)
MCH: 29 pg (ref 26.0–34.0)
MCHC: 33.2 g/dL (ref 30.0–36.0)
MCV: 87.2 fL (ref 78.0–100.0)
Monocytes Absolute: 0.3 10*3/uL (ref 0.1–1.0)
Monocytes Relative: 13 %
Neutro Abs: 1.1 10*3/uL — ABNORMAL LOW (ref 1.7–7.7)
Neutrophils Relative %: 49 %
Platelets: 77 10*3/uL — ABNORMAL LOW (ref 150–400)
RBC: 3.21 MIL/uL — ABNORMAL LOW (ref 4.22–5.81)
RDW: 12.2 % (ref 11.5–15.5)
WBC: 2.3 10*3/uL — ABNORMAL LOW (ref 4.0–10.5)

## 2015-06-27 LAB — GLUCOSE, CAPILLARY
Glucose-Capillary: 55 mg/dL — ABNORMAL LOW (ref 65–99)
Glucose-Capillary: 90 mg/dL (ref 65–99)

## 2015-06-27 LAB — VANCOMYCIN, TROUGH: Vancomycin Tr: 9 ug/mL — ABNORMAL LOW (ref 10.0–20.0)

## 2015-06-27 MED ORDER — MAGNESIUM SULFATE 4 GM/100ML IV SOLN
4.0000 g | Freq: Once | INTRAVENOUS | Status: AC
  Administered 2015-06-27: 4 g via INTRAVENOUS
  Filled 2015-06-27 (×2): qty 100

## 2015-06-27 MED ORDER — SODIUM CHLORIDE 0.9 % IV SOLN
750.0000 mg | INTRAVENOUS | Status: DC
  Administered 2015-06-27 – 2015-06-30 (×4): 750 mg via INTRAVENOUS
  Filled 2015-06-27 (×4): qty 15

## 2015-06-27 NOTE — Progress Notes (Addendum)
[facility]    Date of Admission:  06/24/2015   Total days of antibiotics 23        Day 4 dapto QOD           ID: Bruce Morales is a 40 y.o. 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  Principal Problem:   Acute renal failure (ARF) (HCC) Active Problems:   Bilateral low back pain without sciatica   Morbid obesity (HCC)   Headache   Recent history of Staphylococcal meningitis   Status post lumbar laminectomy   Pancytopenia (HCC)    Subjective: Afebrile, had 1 small BM yesterday morning, he is worried that he has not had a bowel movement since he has suffered with constipation in the past. He reports having significant HA. Medications last night helped  Interval hx: continues to have improvement with cr.  Medications:  . citalopram  10 mg Oral Daily  . DAPTOmycin (CUBICIN)  IV  750 mg Intravenous Q24H  . magnesium sulfate 1 - 4 g bolus IVPB  4 g Intravenous Once  . oxyCODONE  20 mg Oral Q12H  . sodium chloride flush  10-40 mL Intracatheter Q12H  . sodium chloride flush  3 mL Intravenous Q12H    Objective: Vital signs in last 24 hours: Temp:  [98.1 F (36.7 C)-99.8 F (37.7 C)] 98.1 F (36.7 C) (01/27 0825) Pulse Rate:  [70-78] 70 (01/27 0825) Resp:  [16-18] 16 (01/27 0825) BP: (128-139)/(75-80) 128/78 mmHg (01/27 0825) SpO2:  [96 %-97 %] 97 % (01/27 0825) Weight:  [207 lb 7.3 oz (94.1 kg)] 207 lb 7.3 oz (94.1 kg) (01/27 9604) Physical Exam  Constitutional: He is oriented to person, place, and time. He appears well-developed and well-nourished. No distress. Laying in bed HENT:  Mouth/Throat: Oropharynx is clear and moist. No oropharyngeal exudate.  Cardiovascular: Normal rate, regular rhythm and normal heart sounds. Exam reveals no gallop and no friction rub.  No murmur heard.  Pulmonary/Chest: Effort normal and breath sounds normal. No  respiratory distress. He has no wheezes.  Abdominal: Soft. Bowel sounds are normal. He exhibits no distension. There is no tenderness.  Lymphadenopathy:  He has no cervical adenopathy.  Neurological: He is alert and oriented to person, place, and time.  Skin: Skin is warm and dry. No rash noted. No erythema.  Psychiatric: He has a normal mood and affect. His behavior is normal.     Lab Results  Recent Labs  06/26/15 0425 06/27/15 0510  WBC 2.0* 2.3*  HGB 9.2* 9.3*  HCT 28.2* 28.0*  NA 142 141  K 4.3 4.1  CL 111 108  CO2 22 23  BUN 33* 24*  CREATININE 3.28* 3.16*   Liver Panel  Recent Labs  06/24/15 1347  PROT 6.3*  ALBUMIN 3.6  AST 20  ALT 23  ALKPHOS 53  BILITOT 0.9    Microbiology: 1/24 csf NGTD 1/24 blood cx x 2 NGTD  Studies/Results: No results found.   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 Will need baseline CK and weekly CK while on daptomycin. Still plan to take til Jul 17, 2015.  AKI from vancomycin toxicity = thought to be ATN. Cr now 3.16 today. Continues to receive 172ml/hr of IVF. Continue to monitor.   Leukopenia/thrombocytopenia = likely related to vancomycin. Should improve with time as  he is off the medication  Chills = now resolved, repeat blood cx NGTD. Possibly related to vanco toxicity as well  Back pain =will discuss with dr. Yetta Barre if myelogram would be useful to assess if still having CSF leak. Would not be able to get mri with contrast due to AKI. In addition, he needed conscious sedation for MRI in the past due to significant claustrophobia  Lymphocytic pleocytosis in CSF = fungal cx pending. For now, keep on dapto.  Constipation = would recommend to restart home regimen, or laxatives so that he is not having worsening constipation  Headache = defer to Dr. Janee Morn for pain management. i suspect he has high tolerance given he was on  oxycontin  Dr. Luciana Axe available for questions over the weekend. We will see back on Monday.  Drue Second [facility] Cell: [phone] Pager: [phone]  06/27/2015, 12:22 PM

## 2015-06-27 NOTE — Progress Notes (Signed)
PCP was notified that the patient had requested Ibuprofen.

## 2015-06-27 NOTE — Progress Notes (Signed)
Pharmacy Antibiotic Follow-up Note  Bruce Morales is a 40 y.o. year-old male admitted on 06/24/2015.  The patient is currently on daptomycin for MRSE meningitis. He was receiving vancomycin as outpatient but developed AKI so admitted and changed to daptomycin.  Renal function continues to improve and weight has been updated.    Assessment/Plan:  Change to Daptomycin  (remains 8 mg/kg) IV q24h  Check weekly CK level on 1/31  Continue to follow up renal fxn, culture results, and clinical course.   Temp (24hrs), Avg:99.1 F (37.3 C), Min:98.1 F (36.7 C), Max:99.8 F (37.7 C)   Recent Labs Lab 06/24/15 1129 06/24/15 1347 06/25/15 0425 06/26/15 0425 06/27/15 0510  WBC 2.2* 2.0* 1.9* 2.0* 2.3*    Recent Labs Lab 06/24/15 1129 06/24/15 1347 06/25/15 0425 06/26/15 0425 06/27/15 0510  CREATININE 3.94* 3.86* 3.76* 3.28* 3.16*   Estimated Creatinine Clearance: 35 mL/min (by C-G formula based on Cr of 3.16).    Allergies  Allergen Reactions  . Hydrocodone Hives and Itching  . Oxycodone     Mild Itching, patient can tolerate oxycodone  . Percocet [Oxycodone-Acetaminophen]     Itching, patient says he tolerates    Antimicrobials this admission: 1/5 >> Ceftriaxone >>1/10  1/5 >> Vancomycin >> 1.24  1/5 >> Acyclovir >> 1/8 1/24 >> daptomycin >>   Levels/dose changes this admission: 1/25 0850 random vanco = 13mcg/ml 1/27 Daptomycin dose decreased to  (remains 8 mg/kg) based on updated weight 94 kg.  Microbiology results: 1/5 BCx: NGTD 1/5 CSF culture: CoNS 1/24 urine: NG 1/24 CSF: NGTD (Nothing seen on GS) 1/24 blood: NGTD 1/24 CDF fungus: pending  Thank you for allowing pharmacy to be a part of this patient's care.  Lynann Beaver PharmD, BCPS Pager [phone] 06/27/2015 10:13 AM

## 2015-06-27 NOTE — Progress Notes (Signed)
[facility] PROGRESS NOTE  [place] ZOX:[id number] DOB: November 20, 1975 DOA: 06/24/2015 PCP: Rockne Coons, DO  Assessment/Plan: #1 acute renal failure Likely secondary to vancomycin toxicity. Vanc trough levels elevated at 29. Patient states has good urine output. Renal function slowly trending down. Renal ultrasound negative for hydronephrosis. Continue IV fluids. Nephrology following.  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 LP with lymphocytic pleocytosis. Fungal cultures added to CSF. Patient with history of postsurgical lumbar surgery infection. IV vancomycin been discontinued. Continue IV daptomycin per ID recommendations to 07/17/2015. Pain management.  #3 thrombocytopenia/leukopenia Likely secondary to vancomycin. Patient with no bleeding. Vancomycin trough levels at 29. Repeat trough level today. Follow.  #4 diarrhea Patient was on stool softeners/laxatives. Discontinued laxative. Improved. Follow for now.    Code Status: Full Family Communication: Updated patient. No family at bedside. Disposition Plan: Home once renal function back to baseline and improvement with headache.   Consultants:  Infectious diseases: Dr. Drue Second 06/24/2015  Nephrology Dr Arta Silence 06/25/2015  Procedures:  Lumbar puncture 06/24/2015  Renal ultrasound 06/24/2015  Antibiotics:  IV daptomycin 06/24/2015  HPI/Subjective: Patient complaining of headache about a 5/10. Patient states has had no loose stools today. Patient complaining of back pain.  Objective: Filed Vitals:   06/27/15 0525 06/27/15 0825  BP: 136/75 128/78  Pulse: 78 70  Temp: 99.3 F (37.4 C) 98.1 F (36.7 C)  Resp: 18 16    Intake/Output Summary (Last 24 hours) at 06/27/15 1513 Last data filed at 06/27/15 1400  Gross per 24 hour  Intake 2335.83 ml  Output   2675 ml  Net -339.17 ml   Filed Weights   06/25/15 0530 06/26/15 0635 06/27/15 0637   Weight: 94.3 kg (207 lb 14.3 oz) 95.3 kg (210 lb 1.6 oz) 94.1 kg (207 lb 7.3 oz)    Exam:   General:  NAD  Cardiovascular: RRR  Respiratory: CTAB  Abdomen: Soft/NT/ND/+BS  Musculoskeletal:  No clubbing, cyanosis, edema.  Data Reviewed: Basic Metabolic Panel:  Recent Labs Lab 06/24/15 1129 06/24/15 1347 06/25/15 0425 06/26/15 0425 06/27/15 0510  NA 139 141 139 142 141  K 4.2 4.3 4.5 4.3 4.1  CL 105 109 107 111 108  CO2 20* 20* GLUCOSE 84 84 89 86 86  BUN 41* 41* 43* 33* 24*  CREATININE 3.94* 3.86* 3.76* 3.28* 3.16*  CALCIUM 8.9 8.5* 8.2* 8.3* 8.5*  MG  --  1.8  --   --  1.6*  PHOS  --  4.8*  --   --   --    Liver Function Tests:  Recent Labs Lab 06/24/15 1129 06/24/15 1347  AST 21 20  ALT 24 23  ALKPHOS 57 53  BILITOT 0.8 0.9  PROT 6.7 6.3*  ALBUMIN 3.9 3.6    Recent Labs Lab 06/24/15 1129  LIPASE 24   No results for input(s): AMMONIA in the last 168 hours. CBC:  Recent Labs Lab 06/24/15 1129 06/24/15 1347 06/25/15 0425 06/26/15 0425 06/27/15 0510  WBC 2.2* 2.0* 1.9* 2.0* 2.3*  NEUTROABS 1.3* 1.1*  --   --  1.1*  HGB 11.1* 10.1* 10.0* 9.2* 9.3*  HCT 33.7* 29.5* 30.4* 28.2* 28.0*  MCV 86.4 84.3 87.9 88.1 87.2  PLT 91* 88* 81* 69* 77*   Cardiac Enzymes:  Recent Labs Lab 06/24/15 1129  CKTOTAL 27*   BNP (last 3 results) No results for input(s): BNP in the last 8760 hours.  ProBNP (last 3 results) No  results for input(s): PROBNP in the last 8760 hours.  CBG:  Recent Labs Lab 06/25/15 0747 06/26/15 0743 06/27/15 0723 06/27/15 0746  GLUCAP 85 84 55* 90    Recent Results (from the past 240 hour(s))  Culture, blood (routine x 2)     Status: None (Preliminary result)   Collection Time: 06/24/15 11:20 AM  Result Value Ref Range Status   Specimen Description BLOOD PICC LINE  Final   Special Requests BOTTLES DRAWN AEROBIC AND ANAEROBIC 5CC  Final   Culture   Final    NO GROWTH 3 DAYS Performed at [facility]    Report Status PENDING  Incomplete  Culture, blood (routine x 2)     Status: None (Preliminary result)   Collection Time: 06/24/15 11:29 AM  Result Value Ref Range Status   Specimen Description BLOOD LEFT HAND  Final   Special Requests BOTTLES DRAWN AEROBIC AND ANAEROBIC  Final   Culture   Final    NO GROWTH 3 DAYS Performed at [facility]    Report Status PENDING  Incomplete  CSF culture     Status: None (Preliminary result)   Collection Time: 06/24/15  2:59 PM  Result Value Ref Range Status   Specimen Description CSF  Final   Special Requests NONE  Final   Gram Stain   Final    CYTOSPIN SMEAR WBC PRESENT, PREDOMINANTLY MONONUCLEAR NO ORGANISMS SEEN Gram Stain Report Called to,Read Back By and Verified With: MARTHA HAMBY RN 1.24.17 @ 1650 BY RICEJ    Culture   Final    NO GROWTH 3 DAYS Performed at [facility]    Report Status PENDING  Incomplete  Fungus Culture with Smear     Status: None (Preliminary result)   Collection Time: 06/24/15  2:59 PM  Result Value Ref Range Status   Specimen Description CSF  Final   Special Requests Normal  Final   Fungal Smear   Final    NO YEAST OR FUNGAL ELEMENTS SEEN Performed at [facility]    Culture   Final    CULTURE IN PROGRESS FOR FOUR WEEKS Performed at [facility]    Report Status PENDING  Incomplete  Urine culture     Status: None   Collection Time: 06/24/15  8:45 PM  Result Value Ref Range Status   Specimen Description URINE, RANDOM  Final   Special Requests NONE  Final   Culture   Final    NO GROWTH 2 DAYS Performed at [facility]    Report Status 06/26/2015 FINAL  Final     Studies: No results found.  Scheduled Meds: . citalopram  10 mg Oral Daily  . DAPTOmycin (CUBICIN)  IV  750 mg Intravenous Q24H  . oxyCODONE  20 mg Oral Q12H  . sodium chloride flush  10-40 mL Intracatheter Q12H  . sodium chloride flush  3 mL Intravenous Q12H   Continuous  Infusions: . sodium chloride 125 mL/hr at 06/27/15 1610    Principal Problem:   Acute renal failure (ARF) (HCC) Active Problems:   Bilateral low back pain without sciatica   Morbid obesity (HCC)   Headache   Recent history of Staphylococcal meningitis   Status post lumbar laminectomy   Pancytopenia (HCC)    Time spent: 40 minutes    THOMPSON,DANIEL M.D. [facility] Pager [phone]. If 7PM-7AM, please contact night-coverage at [email or web address], password [facility] 06/27/2015, 3:13 PM  LOS: 3 days

## 2015-06-27 NOTE — Progress Notes (Signed)
Rn gave report to receiving RN on [address].  Patient to be transferred to room 1330.

## 2015-06-27 NOTE — Progress Notes (Signed)
  [facility] Progress Note   Subjective: no issues ovenright  Filed Vitals:   06/26/15 2129 06/27/15 0525 06/27/15 0637 06/27/15 0825  BP: 139/80 136/75  128/78  Pulse: 71 78  70  Temp: 99.8 F (37.7 C) 99.3 F (37.4 C)  98.1 F (36.7 C)  TempSrc: Oral Oral  Oral  Resp: Height:     (1.702 m)  Weight:   94.1 kg (207 lb 7.3 oz)   SpO2: 97% 96%  97%    Inpatient medications: . citalopram  10 mg Oral Daily  . DAPTOmycin (CUBICIN)  IV  750 mg Intravenous Q24H  . oxyCODONE  20 mg Oral Q12H  . sodium chloride flush  10-40 mL Intracatheter Q12H  . sodium chloride flush  3 mL Intravenous Q12H   . sodium chloride 125 mL/hr at 06/27/15 0658   acetaminophen **OR** acetaminophen, albuterol, HYDROmorphone (DILAUDID) injection, HYDROmorphone, lactulose, morphine CONCENTRATE, prochlorperazine, promethazine, sodium chloride flush  Exam: Alert obese, no distress No jvd Chest clear bilat RRR soft 1/6 sem no RG Abd obese soft mild RUQ tenderness +bs no  Ext no edema PICC RUE intact, clean and dry exit Neuro is alert Ox 3  UA negative 1.014 UNa 70 UCr 160.98 Renal US no hydro, 2 kidneys normal in size / appearance  Assessment: 1 Acute renal failure - due to vanc/ nsaid's.  Creat down slightly 3.1 today.  Avoiding nephrotoxins/ nsaids'/etc.  Cont IVF"s. Weights and vol are stable.  2 MRSE meningitis - complication of back surg 3 L-S decompression back surg Nov 2016  Plan - as above.    Vinson Moselle MD [facility] pager [phone]    cell [phone] 06/27/2015, 3:06 PM    Recent Labs Lab 06/24/15 1347 06/25/15 0425 06/26/15 0425 06/27/15 0510  NA 141 139 142 141  K 4.3 4.5 4.3 4.1  CL 109 107 111 108  CO2 20* GLUCOSE 84 89 86 86  BUN 41* 43* 33* 24*  CREATININE 3.86* 3.76* 3.28* 3.16*  CALCIUM 8.5* 8.2* 8.3* 8.5*  PHOS 4.8*  --   --   --     Recent Labs Lab 06/24/15 1129 06/24/15 1347  AST 21 20  ALT 24  23  ALKPHOS 57 53  BILITOT 0.8 0.9  PROT 6.7 6.3*  ALBUMIN 3.9 3.6    Recent Labs Lab 06/24/15 1129 06/24/15 1347 06/25/15 0425 06/26/15 0425 06/27/15 0510  WBC 2.2* 2.0* 1.9* 2.0* 2.3*  NEUTROABS 1.3* 1.1*  --   --  1.1*  HGB 11.1* 10.1* 10.0* 9.2* 9.3*  HCT 33.7* 29.5* 30.4* 28.2* 28.0*  MCV 86.4 84.3 87.9 88.1 87.2  PLT 91* 88* 81* 69* 77*

## 2015-06-28 LAB — BASIC METABOLIC PANEL
Anion gap: 13 (ref 5–15)
BUN: 25 mg/dL — ABNORMAL HIGH (ref 6–20)
CO2: 22 mmol/L (ref 22–32)
Calcium: 8.6 mg/dL — ABNORMAL LOW (ref 8.9–10.3)
Chloride: 108 mmol/L (ref 101–111)
Creatinine, Ser: 2.95 mg/dL — ABNORMAL HIGH (ref 0.61–1.24)
GFR calc Af Amer: 30 mL/min — ABNORMAL LOW (ref >60)
GFR calc non Af Amer: 26 mL/min — ABNORMAL LOW (ref >60)
Glucose, Bld: 80 mg/dL (ref 65–99)
Potassium: 4.2 mmol/L (ref 3.5–5.1)
Sodium: 143 mmol/L (ref 135–145)

## 2015-06-28 LAB — CBC
HCT: 28.7 % — ABNORMAL LOW (ref 39.0–52.0)
Hemoglobin: 9.6 g/dL — ABNORMAL LOW (ref 13.0–17.0)
MCH: 28.8 pg (ref 26.0–34.0)
MCHC: 33.4 g/dL (ref 30.0–36.0)
MCV: 86.2 fL (ref 78.0–100.0)
Platelets: 98 10*3/uL — ABNORMAL LOW (ref 150–400)
RBC: 3.33 MIL/uL — ABNORMAL LOW (ref 4.22–5.81)
RDW: 12.2 % (ref 11.5–15.5)
WBC: 2.5 10*3/uL — ABNORMAL LOW (ref 4.0–10.5)

## 2015-06-28 LAB — CSF CULTURE W GRAM STAIN: Culture: NO GROWTH

## 2015-06-28 LAB — GLUCOSE, CAPILLARY: Glucose-Capillary: 72 mg/dL (ref 65–99)

## 2015-06-28 LAB — CSF CULTURE

## 2015-06-28 MED ORDER — SENNOSIDES-DOCUSATE SODIUM 8.6-50 MG PO TABS
1.0000 | ORAL_TABLET | Freq: Every day | ORAL | Status: DC
  Administered 2015-06-28 – 2015-06-29 (×2): 1 via ORAL
  Filled 2015-06-28 (×2): qty 1

## 2015-06-28 MED ORDER — ACETAMINOPHEN 500 MG PO TABS
1000.0000 mg | ORAL_TABLET | Freq: Three times a day (TID) | ORAL | Status: DC
  Administered 2015-06-28 – 2015-06-30 (×7): 1000 mg via ORAL
  Filled 2015-06-28 (×7): qty 2

## 2015-06-28 MED ORDER — POLYETHYLENE GLYCOL 3350 17 G PO PACK
17.0000 g | PACK | Freq: Every day | ORAL | Status: DC
  Administered 2015-06-28 – 2015-06-30 (×3): 17 g via ORAL
  Filled 2015-06-28 (×3): qty 1

## 2015-06-28 NOTE — Progress Notes (Signed)
  [facility] Progress Note   Subjective: no issues ovenright, creat down 2.9  Filed Vitals:   06/27/15 0825 06/27/15 1540 06/27/15 2034 06/28/15 0528  BP: 128/78 125/70 138/79 131/76  Pulse: 70 74 73 84  Temp: 98.1 F (36.7 C) 98.3 F (36.8 C) 98.9 F (37.2 C) 98.5 F (36.9 C)  TempSrc: Oral Oral Oral Oral  Resp: Height:  (1.702 m)     Weight:      SpO2: 97% 98% 98% 96%    Inpatient medications: . acetaminophen  1,000 mg Oral TID  . citalopram  10 mg Oral Daily  . DAPTOmycin (CUBICIN)  IV  750 mg Intravenous Q24H  . oxyCODONE  20 mg Oral Q12H  . polyethylene glycol  17 g Oral Daily  . senna-docusate  1 tablet Oral QHS  . sodium chloride flush  10-40 mL Intracatheter Q12H  . sodium chloride flush  3 mL Intravenous Q12H   . sodium chloride 125 mL/hr at 06/28/15 1003   acetaminophen **OR** acetaminophen, albuterol, HYDROmorphone (DILAUDID) injection, HYDROmorphone, lactulose, morphine CONCENTRATE, prochlorperazine, promethazine, sodium chloride flush  Exam: Alert obese, no distress No jvd Chest clear bilat RRR soft 1/6 sem no RG Abd obese soft mild RUQ tenderness +bs no  Ext no edema PICC RUE intact, clean and dry exit Neuro is alert Ox 3  UA negative 1.014 UNa 70 UCr 160.98 Renal US no hydro, 2 kidneys normal in size / appearance  Assessment: 1 Acute renal failure - ATN due to vanc/ nsaid's. Vanc levels are down.  Cont IVF.   2 MRSE meningitis - complication of back surg 3 L-S decompression back surg Nov 2016 4 Volume - no vol excess   Plan - as above.    Vinson Moselle MD [facility] pager [phone]    cell [phone] 06/28/2015, 1:34 PM    Recent Labs Lab 06/24/15 1347  06/26/15 0425 06/27/15 0510 06/28/15 0446  NA 141  < > 142 141 143  K 4.3  < > 4.3 4.1 4.2  CL 109  < > 111 108 108  CO2 20*  < > GLUCOSE 84  < > 86 86 80  BUN 41*  < > 33* 24* 25*  CREATININE 3.86*  < > 3.28* 3.16*  2.95*  CALCIUM 8.5*  < > 8.3* 8.5* 8.6*  PHOS 4.8*  --   --   --   --   < > = values in this interval not displayed.  Recent Labs Lab 06/24/15 1129 06/24/15 1347  AST 21 20  ALT 24 23  ALKPHOS 57 53  BILITOT 0.8 0.9  PROT 6.7 6.3*  ALBUMIN 3.9 3.6    Recent Labs Lab 06/24/15 1129 06/24/15 1347  06/26/15 0425 06/27/15 0510 06/28/15 0446  WBC 2.2* 2.0*  < > 2.0* 2.3* 2.5*  NEUTROABS 1.3* 1.1*  --   --  1.1*  --   HGB 11.1* 10.1*  < > 9.2* 9.3* 9.6*  HCT 33.7* 29.5*  < > 28.2* 28.0* 28.7*  MCV 86.4 84.3  < > 88.1 87.2 86.2  PLT 91* 88*  < > 69* 77* 98*  < > = values in this interval not displayed.

## 2015-06-28 NOTE — Progress Notes (Signed)
[facility] PROGRESS NOTE  ROCKEY GUARINO VWU:[id number] DOB: 1976/01/27 DOA: 06/24/2015 PCP: Rockne Coons, DO  Assessment/Plan: #1 acute renal failure Likely secondary to vancomycin toxicity. Vanc trough levels elevated at 29. Patient states has good urine output with 1525cc/24hrs. Renal function slowly trending down. Renal ultrasound negative for hydronephrosis. Continue IV fluids. Nephrology following.  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 LP with lymphocytic pleocytosis. Fungal cultures added to CSF. Patient with history of postsurgical lumbar surgery infection. IV vancomycin been discontinued. Continue IV daptomycin per ID recommendations till 07/17/2015. Continue current pain management. Will add Tylenol 1000 mg 3 times a day.  #3 thrombocytopenia/leukopenia Likely secondary to vancomycin. Patient with no bleeding. Vancomycin trough levels at 29. Repeat vancomycin trough level 9. Follow.  #4 diarrhea Patient was on stool softeners/laxatives. Discontinued laxative. Improved. Follow for now.    Code Status: Full Family Communication: Updated patient. No family at bedside. Disposition Plan: Home once renal function back to baseline and improvement with headache.   Consultants:  Infectious diseases: Dr. Drue Second 06/24/2015  Nephrology Dr Arta Silence 06/25/2015  Procedures:  Lumbar puncture 06/24/2015  Renal ultrasound 06/24/2015  Antibiotics:  IV daptomycin 06/24/2015  HPI/Subjective: Patient complaining of headache about a 5/10. Patient states has had no loose stools today. Patient complaining of back pain. Patient states increased urine output and some sense of urinary retention.  Objective: Filed Vitals:   06/27/15 2034 06/28/15 0528  BP: 138/79 131/76  Pulse: 73 84  Temp: 98.9 F (37.2 C) 98.5 F (36.9 C)  Resp: 16 14    Intake/Output Summary (Last 24 hours) at 06/28/15 1231 Last data filed at  06/28/15 1000  Gross per 24 hour  Intake 4705.17 ml  Output   1275 ml  Net 3430.17 ml   Filed Weights   06/25/15 0530 06/26/15 0635 06/27/15 0637  Weight: 94.3 kg (207 lb 14.3 oz) 95.3 kg (210 lb 1.6 oz) 94.1 kg (207 lb 7.3 oz)    Exam:   General:  NAD  Cardiovascular: RRR  Respiratory: CTAB  Abdomen: Soft/NT/ND/+BS  Musculoskeletal:  No clubbing, cyanosis, edema.  Data Reviewed: Basic Metabolic Panel:  Recent Labs Lab 06/24/15 1347 06/25/15 0425 06/26/15 0425 06/27/15 0510 06/28/15 0446  NA 141 139 142 141 143  K 4.3 4.5 4.3 4.1 4.2  CL 109 107 111 108 108  CO2 20* GLUCOSE 84 89 86 86 80  BUN 41* 43* 33* 24* 25*  CREATININE 3.86* 3.76* 3.28* 3.16* 2.95*  CALCIUM 8.5* 8.2* 8.3* 8.5* 8.6*  MG 1.8  --   --  1.6*  --   PHOS 4.8*  --   --   --   --    Liver Function Tests:  Recent Labs Lab 06/24/15 1129 06/24/15 1347  AST 21 20  ALT 24 23  ALKPHOS 57 53  BILITOT 0.8 0.9  PROT 6.7 6.3*  ALBUMIN 3.9 3.6    Recent Labs Lab 06/24/15 1129  LIPASE 24   No results for input(s): AMMONIA in the last 168 hours. CBC:  Recent Labs Lab 06/24/15 1129 06/24/15 1347 06/25/15 0425 06/26/15 0425 06/27/15 0510 06/28/15 0446  WBC 2.2* 2.0* 1.9* 2.0* 2.3* 2.5*  NEUTROABS 1.3* 1.1*  --   --  1.1*  --   HGB 11.1* 10.1* 10.0* 9.2* 9.3* 9.6*  HCT 33.7* 29.5* 30.4* 28.2* 28.0* 28.7*  MCV 86.4 84.3 87.9 88.1 87.2 86.2  PLT 91* 88* 81* 69* 77* 98*  Cardiac Enzymes:  Recent Labs Lab 06/24/15 1129  CKTOTAL 27*   BNP (last 3 results) No results for input(s): BNP in the last 8760 hours.  ProBNP (last 3 results) No results for input(s): PROBNP in the last 8760 hours.  CBG:  Recent Labs Lab 06/25/15 0747 06/26/15 0743 06/27/15 0723 06/27/15 0746 06/28/15 0752  GLUCAP 85 84 55* 90 72    Recent Results (from the past 240 hour(s))  Culture, blood (routine x 2)     Status: None (Preliminary result)   Collection Time: 06/24/15 11:20 AM   Result Value Ref Range Status   Specimen Description BLOOD PICC LINE  Final   Special Requests BOTTLES DRAWN AEROBIC AND ANAEROBIC 5CC  Final   Culture   Final    NO GROWTH 4 DAYS Performed at [facility]    Report Status PENDING  Incomplete  Culture, blood (routine x 2)     Status: None (Preliminary result)   Collection Time: 06/24/15 11:29 AM  Result Value Ref Range Status   Specimen Description BLOOD LEFT HAND  Final   Special Requests BOTTLES DRAWN AEROBIC AND ANAEROBIC  Final   Culture   Final    NO GROWTH 4 DAYS Performed at [facility]    Report Status PENDING  Incomplete  CSF culture     Status: None   Collection Time: 06/24/15  2:59 PM  Result Value Ref Range Status   Specimen Description CSF  Final   Special Requests NONE  Final   Gram Stain   Final    CYTOSPIN SMEAR WBC PRESENT, PREDOMINANTLY MONONUCLEAR NO ORGANISMS SEEN Gram Stain Report Called to,Read Back By and Verified With: MARTHA HAMBY RN 1.24.17 @ 1650 BY RICEJ    Culture   Final    NO GROWTH 3 DAYS Performed at [facility]    Report Status 06/28/2015 FINAL  Final  Fungus Culture with Smear     Status: None (Preliminary result)   Collection Time: 06/24/15  2:59 PM  Result Value Ref Range Status   Specimen Description CSF  Final   Special Requests Normal  Final   Fungal Smear   Final    NO YEAST OR FUNGAL ELEMENTS SEEN Performed at [facility]    Culture   Final    CULTURE IN PROGRESS FOR FOUR WEEKS Performed at [facility]    Report Status PENDING  Incomplete  Urine culture     Status: None   Collection Time: 06/24/15  8:45 PM  Result Value Ref Range Status   Specimen Description URINE, RANDOM  Final   Special Requests NONE  Final   Culture   Final    NO GROWTH 2 DAYS Performed at [facility]    Report Status 06/26/2015 FINAL  Final     Studies: No results found.  Scheduled Meds: . acetaminophen  1,000 mg Oral TID  . citalopram   10 mg Oral Daily  . DAPTOmycin (CUBICIN)  IV  750 mg Intravenous Q24H  . oxyCODONE  20 mg Oral Q12H  . polyethylene glycol  17 g Oral Daily  . senna-docusate  1 tablet Oral QHS  . sodium chloride flush  10-40 mL Intracatheter Q12H  . sodium chloride flush  3 mL Intravenous Q12H   Continuous Infusions: . sodium chloride 125 mL/hr at 06/28/15 1003    Principal Problem:   Acute renal failure (ARF) (HCC) Active Problems:   Bilateral low back pain without sciatica  Morbid obesity (HCC)   Headache   Recent history of Staphylococcal meningitis   Status post lumbar laminectomy   Pancytopenia (HCC)   Bone marrow suppression   Thrombocytopenia (HCC)    Time spent: 40 minutes    Daison Braxton M.D. [facility] Pager [phone]. If 7PM-7AM, please contact night-coverage at [email or web address], password [facility] 06/28/2015, 12:31 PM  LOS: 4 days

## 2015-06-28 NOTE — Progress Notes (Signed)
Offered mattress replacement as ordered, pt refused, states "the mattress is not the issue", "Pain is from back surgery".

## 2015-06-29 LAB — BASIC METABOLIC PANEL
Anion gap: 11 (ref 5–15)
BUN: 20 mg/dL (ref 6–20)
CO2: 24 mmol/L (ref 22–32)
Calcium: 8.8 mg/dL — ABNORMAL LOW (ref 8.9–10.3)
Chloride: 109 mmol/L (ref 101–111)
Creatinine, Ser: 2.63 mg/dL — ABNORMAL HIGH (ref 0.61–1.24)
GFR calc Af Amer: 34 mL/min — ABNORMAL LOW (ref >60)
GFR calc non Af Amer: 29 mL/min — ABNORMAL LOW (ref >60)
Glucose, Bld: 85 mg/dL (ref 65–99)
Potassium: 4.2 mmol/L (ref 3.5–5.1)
Sodium: 144 mmol/L (ref 135–145)

## 2015-06-29 LAB — CBC WITH DIFFERENTIAL/PLATELET
Basophils Absolute: 0 10*3/uL (ref 0.0–0.1)
Basophils Relative: 0 %
Eosinophils Absolute: 0.2 10*3/uL (ref 0.0–0.7)
Eosinophils Relative: 7 %
HCT: 29.1 % — ABNORMAL LOW (ref 39.0–52.0)
Hemoglobin: 9.8 g/dL — ABNORMAL LOW (ref 13.0–17.0)
Lymphocytes Relative: 30 %
Lymphs Abs: 0.9 10*3/uL (ref 0.7–4.0)
MCH: 28.8 pg (ref 26.0–34.0)
MCHC: 33.7 g/dL (ref 30.0–36.0)
MCV: 85.6 fL (ref 78.0–100.0)
Monocytes Absolute: 0.4 10*3/uL (ref 0.1–1.0)
Monocytes Relative: 12 %
Neutro Abs: 1.5 10*3/uL — ABNORMAL LOW (ref 1.7–7.7)
Neutrophils Relative %: 51 %
Platelets: 112 10*3/uL — ABNORMAL LOW (ref 150–400)
RBC: 3.4 MIL/uL — ABNORMAL LOW (ref 4.22–5.81)
RDW: 12.2 % (ref 11.5–15.5)
WBC: 3 10*3/uL — ABNORMAL LOW (ref 4.0–10.5)

## 2015-06-29 LAB — CULTURE, BLOOD (ROUTINE X 2)
Culture: NO GROWTH
Culture: NO GROWTH

## 2015-06-29 LAB — GLUCOSE, CAPILLARY: Glucose-Capillary: 75 mg/dL (ref 65–99)

## 2015-06-29 NOTE — Progress Notes (Signed)
[facility] PROGRESS NOTE  Bruce Morales ZOX:[id number] DOB: August 14, 1975 DOA: 06/24/2015 PCP: Rockne Coons, DO  Assessment/Plan: #1 acute renal failure Likely secondary to vancomycin toxicity. Vanc trough levels elevated at 29. Patient states has good urine output with 2525cc/24hrs. Renal function slowly trending down and at 2.63 today. Renal ultrasound negative for hydronephrosis. Continue IV fluids. Nephrology following.  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 LP with lymphocytic pleocytosis. Fungal cultures added to CSF. Patient with history of postsurgical lumbar surgery infection. IV vancomycin been discontinued. Continue IV daptomycin per ID recommendations till 07/17/2015. Continue current pain management and Tylenol 1000 mg 3 times a day.  #3 thrombocytopenia/leukopenia/pancytopenia Likely secondary to vancomycin. Patient with no bleeding. Vancomycin trough levels at 29. Repeat vancomycin trough level 9. Slowly improving. Follow.  #4 diarrhea Patient was on stool softeners/laxatives. Discontinued laxative. Improved. Follow for now.    Code Status: Full Family Communication: Updated patient and family at bedside. Disposition Plan: Home once renal function back to baseline and improvement with headache.   Consultants:  Infectious diseases: Dr. Drue Second 06/24/2015  Nephrology Dr Arta Silence 06/25/2015  Procedures:  Lumbar puncture 06/24/2015  Renal ultrasound 06/24/2015  Antibiotics:  IV daptomycin 06/24/2015  HPI/Subjective: Patient states headache and neck pain improving. Still c/o back pain. Wants to take a shower.  Objective: Filed Vitals:   06/29/15 0442 06/29/15 1443  BP: 138/60 152/81  Pulse: 71 66  Temp: 97.9 F (36.6 C) 97.6 F (36.4 C)  Resp: 18 18    Intake/Output Summary (Last 24 hours) at 06/29/15 1503 Last data filed at 06/29/15 1300  Gross per 24 hour  Intake   1480 ml  Output    2400 ml  Net   -920 ml   Filed Weights   06/25/15 0530 06/26/15 0635 06/27/15 0637  Weight: 94.3 kg (207 lb 14.3 oz) 95.3 kg (210 lb 1.6 oz) 94.1 kg (207 lb 7.3 oz)    Exam:   General:  NAD  Cardiovascular: RRR  Respiratory: CTAB  Abdomen: Soft/NT/ND/+BS  Musculoskeletal:  No clubbing, cyanosis, edema.  Data Reviewed: Basic Metabolic Panel:  Recent Labs Lab 06/24/15 1347 06/25/15 0425 06/26/15 0425 06/27/15 0510 06/28/15 0446 06/29/15 0447  NA 141 139 142 141 143 144  K 4.3 4.5 4.3 4.1 4.2 4.2  CL 109 107 111 108 108 109  CO2 20* GLUCOSE 84 89 86 86 80 85  BUN 41* 43* 33* 24* 25* 20  CREATININE 3.86* 3.76* 3.28* 3.16* 2.95* 2.63*  CALCIUM 8.5* 8.2* 8.3* 8.5* 8.6* 8.8*  MG 1.8  --   --  1.6*  --   --   PHOS 4.8*  --   --   --   --   --    Liver Function Tests:  Recent Labs Lab 06/24/15 1129 06/24/15 1347  AST 21 20  ALT 24 23  ALKPHOS 57 53  BILITOT 0.8 0.9  PROT 6.7 6.3*  ALBUMIN 3.9 3.6    Recent Labs Lab 06/24/15 1129  LIPASE 24   No results for input(s): AMMONIA in the last 168 hours. CBC:  Recent Labs Lab 06/24/15 1129 06/24/15 1347 06/25/15 0425 06/26/15 0425 06/27/15 0510 06/28/15 0446 06/29/15 0447  WBC 2.2* 2.0* 1.9* 2.0* 2.3* 2.5* 3.0*  NEUTROABS 1.3* 1.1*  --   --  1.1*  --  1.5*  HGB 11.1* 10.1* 10.0* 9.2* 9.3* 9.6* 9.8*  HCT 33.7* 29.5* 30.4* 28.2* 28.0* 28.7* 29.1*  MCV 86.4 84.3 87.9 88.1 87.2 86.2 85.6  PLT 91* 88* 81* 69* 77* 98* 112*   Cardiac Enzymes:  Recent Labs Lab 06/24/15 1129  CKTOTAL 27*   BNP (last 3 results) No results for input(s): BNP in the last 8760 hours.  ProBNP (last 3 results) No results for input(s): PROBNP in the last 8760 hours.  CBG:  Recent Labs Lab 06/26/15 0743 06/27/15 0723 06/27/15 0746 06/28/15 0752 06/29/15 0805  GLUCAP 84 55* 90 72 75    Recent Results (from the past 240 hour(s))  Culture, blood (routine x 2)     Status: None (Preliminary result)    Collection Time: 06/24/15 11:20 AM  Result Value Ref Range Status   Specimen Description BLOOD PICC LINE  Final   Special Requests BOTTLES DRAWN AEROBIC AND ANAEROBIC 5CC  Final   Culture   Final    NO GROWTH 4 DAYS Performed at [facility]    Report Status PENDING  Incomplete  Culture, blood (routine x 2)     Status: None (Preliminary result)   Collection Time: 06/24/15 11:29 AM  Result Value Ref Range Status   Specimen Description BLOOD LEFT HAND  Final   Special Requests BOTTLES DRAWN AEROBIC AND ANAEROBIC  Final   Culture   Final    NO GROWTH 4 DAYS Performed at [facility]    Report Status PENDING  Incomplete  CSF culture     Status: None   Collection Time: 06/24/15  2:59 PM  Result Value Ref Range Status   Specimen Description CSF  Final   Special Requests NONE  Final   Gram Stain   Final    CYTOSPIN SMEAR WBC PRESENT, PREDOMINANTLY MONONUCLEAR NO ORGANISMS SEEN Gram Stain Report Called to,Read Back By and Verified With: MARTHA HAMBY RN 1.24.17 @ 1650 BY RICEJ    Culture   Final    NO GROWTH 3 DAYS Performed at [facility]    Report Status 06/28/2015 FINAL  Final  Fungus Culture with Smear     Status: None (Preliminary result)   Collection Time: 06/24/15  2:59 PM  Result Value Ref Range Status   Specimen Description CSF  Final   Special Requests Normal  Final   Fungal Smear   Final    NO YEAST OR FUNGAL ELEMENTS SEEN Performed at [facility]    Culture   Final    CULTURE IN PROGRESS FOR FOUR WEEKS Performed at [facility]    Report Status PENDING  Incomplete  Urine culture     Status: None   Collection Time: 06/24/15  8:45 PM  Result Value Ref Range Status   Specimen Description URINE, RANDOM  Final   Special Requests NONE  Final   Culture   Final    NO GROWTH 2 DAYS Performed at [facility]    Report Status 06/26/2015 FINAL  Final     Studies: No results found.  Scheduled Meds: .  acetaminophen  1,000 mg Oral TID  . citalopram  10 mg Oral Daily  . DAPTOmycin (CUBICIN)  IV  750 mg Intravenous Q24H  . oxyCODONE  20 mg Oral Q12H  . polyethylene glycol  17 g Oral Daily  . senna-docusate  1 tablet Oral QHS  . sodium chloride flush  10-40 mL Intracatheter Q12H  . sodium chloride flush  3 mL Intravenous Q12H   Continuous Infusions: . sodium chloride 125 mL/hr at 06/29/15 1034    Principal  Problem:   Acute renal failure (ARF) (HCC) Active Problems:   Bilateral low back pain without sciatica   Morbid obesity (HCC)   Headache   Recent history of Staphylococcal meningitis   Status post lumbar laminectomy   Pancytopenia (HCC)   Bone marrow suppression   Thrombocytopenia (HCC)    Time spent: 40 minutes    THOMPSON,DANIEL M.D. [facility] Pager [phone]. If 7PM-7AM, please contact night-coverage at [email or web address], password [facility] 06/29/2015, 3:03 PM  LOS: 5 days

## 2015-06-29 NOTE — Progress Notes (Signed)
  [facility] Progress Note   Subjective: creat down 2.6  Filed Vitals:   06/28/15 1413 06/28/15 2028 06/29/15 0442 06/29/15 1443  BP: 131/81 130/82 138/60 152/81  Pulse: 68 79 71 66  Temp: 98.4 F (36.9 C) 98.5 F (36.9 C) 97.9 F (36.6 C) 97.6 F (36.4 C)  TempSrc: Oral Oral Oral Oral  Resp: Height:      Weight:      SpO2: 100% 100% 100% 97%    Inpatient medications: . acetaminophen  1,000 mg Oral TID  . citalopram  10 mg Oral Daily  . DAPTOmycin (CUBICIN)  IV  750 mg Intravenous Q24H  . oxyCODONE  20 mg Oral Q12H  . polyethylene glycol  17 g Oral Daily  . senna-docusate  1 tablet Oral QHS  . sodium chloride flush  10-40 mL Intracatheter Q12H  . sodium chloride flush  3 mL Intravenous Q12H   . sodium chloride 125 mL/hr at 06/29/15 1837   acetaminophen **OR** acetaminophen, albuterol, HYDROmorphone (DILAUDID) injection, HYDROmorphone, lactulose, morphine CONCENTRATE, prochlorperazine, promethazine, sodium chloride flush  Exam: Alert obese, no distress No jvd Chest clear bilat RRR soft 1/6 sem no RG Abd obese soft mild RUQ tenderness +bs no  Ext no edema PICC RUE intact, clean and dry exit Neuro is alert Ox 3  UA negative 1.014 UNa 70 UCr 160.98 Renal US no hydro, 2 kidneys normal in size / appearance  Assessment: 1 Acute renal failure - ATN due to vanc/ nsaid's. Creat slowly improving.   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  Avoid all nsaid products, tylenol ok.  Will arrange for 1 month f/u at [facility].  Will sign off.    Vinson Moselle MD [facility] pager [phone]    cell [phone] 06/29/2015, 6:50 PM    Recent Labs Lab 06/24/15 1347  06/27/15 0510 06/28/15 0446 06/29/15 0447  NA 141  < > 141 143 144  K 4.3  < > 4.1 4.2 4.2  CL 109  < > 108 108 109  CO2  20*  < > GLUCOSE 84  < > 86 80 85  BUN 41*  < > 24* 25* 20  CREATININE 3.86*  < > 3.16* 2.95* 2.63*  CALCIUM 8.5*  < > 8.5* 8.6* 8.8*  PHOS 4.8*  --   --   --   --   < > = values in this interval not displayed.  Recent Labs Lab 06/24/15 1129 06/24/15 1347  AST 21 20  ALT 24 23  ALKPHOS 57 53  BILITOT 0.8 0.9  PROT 6.7 6.3*  ALBUMIN 3.9 3.6    Recent Labs Lab 06/24/15 1347  06/27/15 0510 06/28/15 0446 06/29/15 0447  WBC 2.0*  < > 2.3* 2.5* 3.0*  NEUTROABS 1.1*  --  1.1*  --  1.5*  HGB 10.1*  < > 9.3* 9.6* 9.8*  HCT 29.5*  < > 28.0* 28.7* 29.1*  MCV 84.3  < > 87.2 86.2 85.6  PLT 88*  < > 77* 98* 112*  < > = values in this interval not displayed.

## 2015-06-30 ENCOUNTER — Ambulatory Visit: Payer: Self-pay | Admitting: Internal Medicine

## 2015-06-30 DIAGNOSIS — Y838 Other surgical procedures as the cause of abnormal reaction of the patient, or of later complication, without mention of misadventure at the time of the procedure: Secondary | ICD-10-CM

## 2015-06-30 DIAGNOSIS — N141 Nephropathy induced by other drugs, medicaments and biological substances: Secondary | ICD-10-CM

## 2015-06-30 DIAGNOSIS — T814XXA Infection following a procedure, initial encounter: Secondary | ICD-10-CM

## 2015-06-30 DIAGNOSIS — M549 Dorsalgia, unspecified: Secondary | ICD-10-CM

## 2015-06-30 DIAGNOSIS — D61818 Other pancytopenia: Secondary | ICD-10-CM

## 2015-06-30 LAB — BASIC METABOLIC PANEL
Anion gap: 10 (ref 5–15)
BUN: 17 mg/dL (ref 6–20)
CO2: 25 mmol/L (ref 22–32)
Calcium: 8.3 mg/dL — ABNORMAL LOW (ref 8.9–10.3)
Chloride: 109 mmol/L (ref 101–111)
Creatinine, Ser: 2.49 mg/dL — ABNORMAL HIGH (ref 0.61–1.24)
GFR calc Af Amer: 36 mL/min — ABNORMAL LOW (ref >60)
GFR calc non Af Amer: 31 mL/min — ABNORMAL LOW (ref >60)
Glucose, Bld: 83 mg/dL (ref 65–99)
Potassium: 4 mmol/L (ref 3.5–5.1)
Sodium: 144 mmol/L (ref 135–145)

## 2015-06-30 LAB — GLUCOSE, CAPILLARY: Glucose-Capillary: 79 mg/dL (ref 65–99)

## 2015-06-30 MED ORDER — OXYCODONE HCL ER 20 MG PO T12A: 20.0000 mg | EXTENDED_RELEASE_TABLET | Freq: Two times a day (BID) | ORAL | Status: DC

## 2015-06-30 MED ORDER — HEPARIN SOD (PORK) LOCK FLUSH 100 UNIT/ML IV SOLN: 500.0000 [IU] | Freq: Once | INTRAVENOUS | Status: DC

## 2015-06-30 MED ORDER — MORPHINE SULFATE 15 MG PO TABS: 15.0000 mg | ORAL_TABLET | ORAL | Status: DC | PRN

## 2015-06-30 MED ORDER — POLYETHYLENE GLYCOL 3350 17 G PO PACK: 17.0000 g | PACK | Freq: Every day | ORAL | Status: DC

## 2015-06-30 MED ORDER — SENNOSIDES-DOCUSATE SODIUM 8.6-50 MG PO TABS: 1.0000 | ORAL_TABLET | Freq: Every day | ORAL | Status: DC

## 2015-06-30 MED ORDER — PROCHLORPERAZINE MALEATE 10 MG PO TABS: 10.0000 mg | ORAL_TABLET | Freq: Four times a day (QID) | ORAL | Status: DC | PRN

## 2015-06-30 MED ORDER — SODIUM CHLORIDE 0.9 % IV SOLN: 750.0000 mg | INTRAVENOUS | Status: DC

## 2015-06-30 NOTE — Discharge Summary (Signed)
Physician Discharge Summary  Bruce Morales ZOX:[id number] DOB: [birth year] DOA: 06/24/2015  PCP: Rockne Coons, DO  Admit date: 06/24/2015 Discharge date: 06/30/2015  Time spent: 65 minutes  Recommendations for Outpatient Follow-up:  1. Follow-up with Dr. Orvan Falconer of ID clinic in 2-3 weeks. 2. Follow-up with nephrology in 2-3 weeks. Patient will need a basic metabolic profile done on follow-up to follow-up on renal function. 3. Follow-up with Dr. Yetta Barre of neurosurgery in 2-3 weeks.   Discharge Diagnoses:  Principal Problem:   Acute renal failure (ARF) (HCC) Active Problems:   Bilateral low back pain without sciatica   Morbid obesity (HCC)   Headache   Recent history of Staphylococcal meningitis   Status post lumbar laminectomy   Pancytopenia (HCC)   Bone marrow suppression   Thrombocytopenia (HCC)   Discharge Condition: Stable and improved  Diet recommendation: Regular.   Filed Weights   06/25/15 0530 06/26/15 0635 06/27/15 0637  Weight: 94.3 kg (207 lb 14.3 oz) 95.3 kg (210 lb 1.6 oz) 94.1 kg (207 lb 7.3 oz)    History of present illness:  Per Dr Elisabeth Pigeon Patient is very pleasant 40 year old male, status post L5-S1 right hemi-laminectomy on 04/17/2015 by Dr. Yetta Barre, was discharged on doxycycline at that time, recent admission from January 5 2 06/15/2015 for evaluation of progressively worsening headaches that were determined to be secondary to MRSA meningitis after patient had lumbar puncture that revealed CSF cultures positive for MRSA. Patient was discharged home on vancomycin and has been followed by infectious disease specialist Dr. Orvan Falconer (tentative date for vancomycin completion was set at 08/10/2015). Patient had vancomycin level checked at infectious disease clinic and was advised to go to emergency department for further evaluation. Patient reported he felt overall better since last discharge but continued to have low-grade temperatures 99 F, he stated his  headaches are less intense and less frequent, he denied any specific abdominal or urinary concerns. Patient also denied chest pain or shortness of breath.  In emergency department, vital signs stable, blood work notable for WBC 2.2, hemoglobin 11, platelets 91, creatinine 3.94. [facility] asked to admit for further evaluation of acute renal failure.   Hospital Course:  #1 acute renal failure Likely secondary to vancomycin toxicity. Vanc trough levels elevated at 29. Nephrology was consulted to follow the patient during the hospitalization. Patient was placed on IV fluids and patient's urine output monitored. Patient's renal function improved and trended down such that by day of discharge his creatinine was down to 2.49 from 3.86 on admission. Patient had good urine output. Renal ultrasound negative for hydronephrosis. Patient will follow-up with PCP as outpatient as well as with nephrology.  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 LP with lymphocytic pleocytosis. Fungal cultures added to CSF. Patient with history of postsurgical lumbar surgery infection. IV vancomycin been discontinued. Continue IV daptomycin per ID recommendations till 07/17/2015. Continue current pain management and Tylenol 1000 mg 3 times a day. Patient was also placed on IV Compazine as needed for headaches which improved. Patient will follow-up with ID as outpatient.  #3 thrombocytopenia/leukopenia/pancytopenia Likely secondary to vancomycin. Patient with no bleeding. Vancomycin trough levels at 29. Repeat vancomycin trough level 9. Slowly improved. Outpatient follow-up.  #4 diarrhea Patient was on stool softeners/laxatives. Discontinued laxative. Improved.   Procedures:  Lumbar puncture 06/24/2015  Renal ultrasound 06/24/2015  Consultations:  Infectious diseases: Dr. Drue Second 06/24/2015  Nephrology Dr Arta Silence 06/25/2015  Discharge Exam: Ceasar Mons Vitals:   06/30/15 8119  06/30/15 1223  BP: 147/87 124/85  Pulse: 71 65  Temp: 98.3 F (36.8 C) 98 F (36.7 C)  Resp: 18 18    General: NAD Cardiovascular: RRR Respiratory: CTAB  Discharge Instructions   Discharge Instructions    Diet general    Complete by:  As directed      Discharge instructions    Complete by:  As directed   Follow up with Dr Orvan Falconer, ID in 2 weeks, office will call with appointment time. Follow up with nephrology in 2-3 weeks, office will call with appointment time. Follow up with Dr Yetta Barre in 2-3 weeks.     Increase activity slowly    Complete by:  As directed           Current Discharge Medication List    START taking these medications   Details  DAPTOmycin 750 mg in sodium chloride 0.9 % 100 mL Inject 750 mg into the vein daily. Qty: 12000 mg, Refills: 0    morphine (MSIR) 15 MG tablet Take 1 tablet (15 mg total) by mouth every 4 (four) hours as needed for severe pain. Qty: 30 tablet, Refills: 0    polyethylene glycol (MIRALAX / GLYCOLAX) packet Take 17 g by mouth daily. Qty: 14 each, Refills: 0    prochlorperazine (COMPAZINE) 10 MG tablet Take 1 tablet (10 mg total) by mouth every 6 (six) hours as needed for nausea or vomiting. Qty: 20 tablet, Refills: 0    senna-docusate (SENOKOT-S) 8.6-50 MG tablet Take 1 tablet by mouth at bedtime.      CONTINUE these medications which have CHANGED   Details  oxyCODONE (OXYCONTIN) 20 mg 12 hr tablet Take 1 tablet (20 mg total) by mouth every 12 (twelve) hours. Qty: 40 tablet, Refills: 0      CONTINUE these medications which have NOT CHANGED   Details  albuterol (PROVENTIL HFA;VENTOLIN HFA) 108 (90 BASE) MCG/ACT inhaler Inhale 2 puffs into the lungs every 4 (four) hours as needed (cough, shortness of breath or wheezing.). Qty: 1 Inhaler, Refills: 3   Associated Diagnoses: Acute bronchitis, unspecified organism    lactulose (CHRONULAC) 10 GM/15ML solution Take 5-10 mLs by mouth daily as needed for mild constipation.      promethazine (PHENERGAN) 25 MG tablet Take 1 tablet (25 mg total) by mouth every 8 (eight) hours as needed for nausea or vomiting. Qty: 20 tablet, Refills: 0   Associated Diagnoses: Bilious vomiting with nausea      STOP taking these medications     gabapentin (NEURONTIN) 300 MG capsule      heparin lock flush 100 UNIT/ML SOLN injection      HYDROmorphone (DILAUDID) 2 MG tablet      ibuprofen (ADVIL,MOTRIN) 600 MG tablet      meloxicam (MOBIC) 15 MG tablet      Morphine Sulfate (MORPHINE CONCENTRATE) 10 MG/0.5ML SOLN concentrated solution      MOVANTIK 25 MG TABS tablet      vancomycin 1,750 mg in sodium chloride 0.9 % 500 mL      citalopram (CELEXA) 10 MG tablet        Allergies  Allergen Reactions  . Hydrocodone Hives and Itching  . Oxycodone     Mild Itching, patient can tolerate oxycodone  . Percocet [Oxycodone-Acetaminophen]     Itching, patient says he tolerates   Follow-up Information    Follow up with LE, THAO PHUONG, DO. Schedule an appointment as soon as possible for a visit in 1 week.   Specialty:  Family  Medicine   Contact information:   [address] [place] [address] [phone]       Follow up with Cliffton Asters, MD. Schedule an appointment as soon as possible for a visit in 2 weeks.   Specialty:  Infectious Diseases   Why:  office will call with appointment time.   Contact information:   [address] [place] [address] [phone]       Follow up with Tia Alert, MD. Schedule an appointment as soon as possible for a visit in 2 weeks.   Specialty:  Neurosurgery   Why:  f/u in 2-3 weeks.   Contact information:   [address] [place] [address] [phone]       Follow up with [facility]. Schedule an appointment as soon as possible for a visit in 2 weeks.   Why:  office will call with appointment time.   Contact information:   [address] [place] [address] [phone]         The results of significant diagnostics from this hospitalization (including imaging, microbiology, ancillary and laboratory) are listed below for reference.    Significant Diagnostic Studies: Ct Head Wo Contrast  06/05/2015  CLINICAL DATA:  Severe headaches for 3 days EXAM: CT HEAD WITHOUT CONTRAST TECHNIQUE: Contiguous axial images were obtained from the base of the skull through the vertex without intravenous contrast. COMPARISON:  None. FINDINGS: The bony calvarium is intact. The ventricles are of normal size and configuration. No findings to suggest acute hemorrhage, acute infarction or space-occupying mass lesion are noted. IMPRESSION: No acute intracranial abnormality noted. Electronically Signed   By: Alcide Clever M.D.   On: 06/05/2015 07:52   Mr Lumbar Spine W Wo Contrast  06/11/2015  CLINICAL DATA:  Progressive low back pain and bilateral hip pain and right leg pain since right hemilaminectomy at L5-S1 on 04/17/2015. Now has bacterial meningitis. EXAM: MRI LUMBAR SPINE WITHOUT AND WITH CONTRAST TECHNIQUE: Multiplanar and multiecho pulse sequences of the lumbar spine were obtained without and with intravenous contrast. CONTRAST:  20mL MULTIHANCE GADOBENATE DIMEGLUMINE 529 MG/ML IV SOLN COMPARISON:  MRIs dated 05/21/2015 and 02/13/2015 FINDINGS: L5-S1: The patient has developed a subdural fluid collection along the posterior aspect of the thecal sac is deviates the nerve roots anteriorly within the thecal sac from the level of the tip of the conus through the S1 segment. This is not felt to be significant and is possibly iatrogenic from the recent lumbar puncture. There is a tiny defect in the thecal sac at the site of the laminectomy visible only on image 35 of series 11 with a tiny amount of fluid extending into the laminectomy defect. There is no pseudomeningocele. No fluid collections in the soft tissues posterior to that site. There is meningeal enhancement and dural enhancement extending  along the posterior aspect of the thecal sac throughout the lumbar spine. The remainder of the lumbar spine appears normal. There is no appreciable osteomyelitis or discitis. Normal postsurgical changes at L5-S1 with less edema and enhancement in the vertebral endplates than on the prior study of 05/21/2015. IMPRESSION: 1. Small defect in the thecal sac at L5-S1 on the right without a pseudomeningocele. 2. Enhancement of the meninges and dura posteriorly consistent with the clinical diagnosis of meningitis. 3. Subdural fluid collection deviates the nerve roots in the thecal sac anteriorly as described above. 4. No evidence of discitis or osteomyelitis or abscess. Critical Value/emergent results were called by telephone at  the time of interpretation on 06/11/2015 at 3:50 pm to Dr. Marikay Alar, who verbally acknowledged these results. Electronically Signed   By: Francene Boyers M.D.   On: 06/11/2015 15:55   US Renal  06/24/2015  CLINICAL DATA:  Initial evaluation for acute renal failure. EXAM: RENAL / URINARY TRACT ULTRASOUND COMPLETE COMPARISON:  Prior study from 04/14/2014. FINDINGS: Right Kidney: Length: 13.4 cm. Echogenicity within normal limits. No mass or hydronephrosis visualized. Left Kidney: Length: 14.4 cm. Echogenicity within normal limits. No mass or hydronephrosis visualized. Bladder: Appears normal for degree of bladder distention. 295 cc of prevoid volume present. IMPRESSION: Normal sonographic appearance of the kidneys.  No hydronephrosis. Electronically Signed   By: Rise Mu M.D.   On: 06/24/2015 20:13   Dg Chest Port 1 View  06/05/2015  CLINICAL DATA:  Sepsis protocol. Severe headache and weakness. Fever for 3 days. EXAM: PORTABLE CHEST 1 VIEW COMPARISON:  04/21/2015 FINDINGS: The heart is normal in size. The mediastinal and hilar contours are normal. Diffuse increased interstitial markings and peribronchial thickening most likely reflecting bronchitis or interstitial pneumonitis. No  focal airspace consolidation or joint effusion. The bony thorax is intact. IMPRESSION: Findings suggest bronchitis or interstitial pneumonitis. No focal infiltrate or effusion. Electronically Signed   By: Rudie Meyer M.D.   On: 06/05/2015 17:53   Dg Lumbar Puncture Fluoro Guide  06/24/2015  CLINICAL DATA:  Bacterial meningitis. EXAM: DIAGNOSTIC LUMBAR PUNCTURE UNDER FLUOROSCOPIC GUIDANCE FLUOROSCOPY TIME:  Radiation Exposure Index (as provided by the fluoroscopic device): If the device does not provide the exposure index: Fluoroscopy Time (in minutes and seconds): Number of Acquired Images:  None PROCEDURE: Informed consent was obtained from the patient prior to the procedure, including potential complications of headache, allergy, and pain. With the patient prone, the lower back was prepped with Betadine. 1% Lidocaine was used for local anesthesia. Lumbar puncture was performed at the L3-4 level using a 22 gauge needle with return of clear CSF with an opening pressure of 19 cm water. Thirteen ml of CSF were obtained for laboratory studies. The patient tolerated the procedure well and there were no apparent complications. IMPRESSION: 1. Technically successful diagnostic lumbar puncture. Electronically Signed   By: Signa Kell M.D.   On: 06/24/2015 15:54   Dg Fluoro Guide Lumbar Puncture  06/05/2015  CLINICAL DATA:  Headache, fever and neck stiffness. EXAM: DIAGNOSTIC LUMBAR PUNCTURE UNDER FLUOROSCOPIC GUIDANCE FLUOROSCOPY TIME:  Radiation Exposure Index (as provided by the fluoroscopic device): If the device does not provide the exposure index: Fluoroscopy Time (in minutes and seconds):  2 minutes and 2 seconds Number of Acquired Images:  0 PROCEDURE: Informed consent was obtained from the patient prior to the procedure, including potential complications of headache, allergy, and pain. With the patient prone, the lower back was prepped with Betadine. 1% Lidocaine was used for local anesthesia. Lumbar  puncture was performed at the L3-4 level using a 22 gauge needle with return of 15.5 CSF with an opening pressure of could not be obtained. Cm water. 15.5 ml of CSF were obtained for laboratory studies. The patient tolerated the procedure well and there were no apparent complications. IMPRESSION: 1. Technically successful lumbar puncture. The opening pressure could not be obtained. 2. 15.5 cc of CSF were removed and submitted to the laboratory Electronically Signed   By: Signa Kell M.D.   On: 06/05/2015 11:10    Microbiology: Recent Results (from the past 240 hour(s))  Culture, blood (routine x 2)  Status: None   Collection Time: 06/24/15 11:20 AM  Result Value Ref Range Status   Specimen Description BLOOD PICC LINE  Final   Special Requests BOTTLES DRAWN AEROBIC AND ANAEROBIC 5CC  Final   Culture   Final    NO GROWTH 5 DAYS Performed at [facility]    Report Status 06/29/2015 FINAL  Final  Culture, blood (routine x 2)     Status: None   Collection Time: 06/24/15 11:29 AM  Result Value Ref Range Status   Specimen Description BLOOD LEFT HAND  Final   Special Requests BOTTLES DRAWN AEROBIC AND ANAEROBIC  Final   Culture   Final    NO GROWTH 5 DAYS Performed at [facility]    Report Status 06/29/2015 FINAL  Final  CSF culture     Status: None   Collection Time: 06/24/15  2:59 PM  Result Value Ref Range Status   Specimen Description CSF  Final   Special Requests NONE  Final   Gram Stain   Final    CYTOSPIN SMEAR WBC PRESENT, PREDOMINANTLY MONONUCLEAR NO ORGANISMS SEEN Gram Stain Report Called to,Read Back By and Verified With: MARTHA HAMBY RN 1.24.17 @ 1650 BY RICEJ    Culture   Final    NO GROWTH 3 DAYS Performed at [facility]    Report Status 06/28/2015 FINAL  Final  Fungus Culture with Smear     Status: None (Preliminary result)   Collection Time: 06/24/15  2:59 PM  Result Value Ref Range Status   Specimen Description CSF  Final    Special Requests Normal  Final   Fungal Smear   Final    NO YEAST OR FUNGAL ELEMENTS SEEN Performed at [facility]    Culture   Final    CULTURE IN PROGRESS FOR FOUR WEEKS Performed at [facility]    Report Status PENDING  Incomplete  Urine culture     Status: None   Collection Time: 06/24/15  8:45 PM  Result Value Ref Range Status   Specimen Description URINE, RANDOM  Final   Special Requests NONE  Final   Culture   Final    NO GROWTH 2 DAYS Performed at [facility]    Report Status 06/26/2015 FINAL  Final     Labs: Basic Metabolic Panel:  Recent Labs Lab 06/24/15 1347  06/26/15 0425 06/27/15 0510 06/28/15 0446 06/29/15 0447 06/30/15 0627  NA 141  < > 142 141 143 144 144  K 4.3  < > 4.3 4.1 4.2 4.2 4.0  CL 109  < > 111 108 108 109 109  CO2 20*  < > GLUCOSE 84  < > 86 86 80 85 83  BUN 41*  < > 33* 24* 25* 20 17  CREATININE 3.86*  < > 3.28* 3.16* 2.95* 2.63* 2.49*  CALCIUM 8.5*  < > 8.3* 8.5* 8.6* 8.8* 8.3*  MG 1.8  --   --  1.6*  --   --   --   PHOS 4.8*  --   --   --   --   --   --   < > = values in this interval not displayed. Liver Function Tests:  Recent Labs Lab 06/24/15 1129 06/24/15 1347  AST 21 20  ALT 24 23  ALKPHOS 57 53  BILITOT 0.8 0.9  PROT 6.7 6.3*  ALBUMIN 3.9 3.6    Recent Labs Lab 06/24/15  1129  LIPASE 24   No results for input(s): AMMONIA in the last 168 hours. CBC:  Recent Labs Lab 06/24/15 1129 06/24/15 1347 06/25/15 0425 06/26/15 0425 06/27/15 0510 06/28/15 0446 06/29/15 0447  WBC 2.2* 2.0* 1.9* 2.0* 2.3* 2.5* 3.0*  NEUTROABS 1.3* 1.1*  --   --  1.1*  --  1.5*  HGB 11.1* 10.1* 10.0* 9.2* 9.3* 9.6* 9.8*  HCT 33.7* 29.5* 30.4* 28.2* 28.0* 28.7* 29.1*  MCV 86.4 84.3 87.9 88.1 87.2 86.2 85.6  PLT 91* 88* 81* 69* 77* 98* 112*   Cardiac Enzymes:  Recent Labs Lab 06/24/15 1129  CKTOTAL 27*   BNP: BNP (last 3 results) No results for input(s): BNP in the last 8760  hours.  ProBNP (last 3 results) No results for input(s): PROBNP in the last 8760 hours.  CBG:  Recent Labs Lab 06/27/15 0723 06/27/15 0746 06/28/15 0752 06/29/15 0805 06/30/15 0812  GLUCAP 55* 90 72 75 79       Signed:  THOMPSON,DANIEL MD.  [facility] 06/30/2015, 5:38 PM

## 2015-06-30 NOTE — Progress Notes (Signed)
Pt to continue IV therapy at home with [facility] RN for IV ABX at discharge. Prescription for new abx and orders for lab draws sent via fax to [facility]. (fax #:  [phone]). Pharmacist Gala Romney was informed of information via phone as well. This Cm received confirmation of fax. Pt informed of above information and to contact [facility] if he has any questions. No other CM needs communicated.  DME: air mattress ordered by MD. [facility] DME rep contacted for DME need.  Sandford Craze RN,BSN,NCM [phone]

## 2015-06-30 NOTE — Progress Notes (Signed)
Discharge instructions explained to pt and prescriptions given to him. Wife in to pick pt up and take home. Pt to have [facility] visiting for antibiotics.

## 2015-06-30 NOTE — Progress Notes (Signed)
Pharmacy Antibiotic Follow-up Note  Bruce Morales is a 40 y.o. year-old male admitted on 06/24/2015.  The patient is currently on daptomycin for MRSE meningitis. He was receiving vancomycin as outpatient but developed AKI so admitted and changed to daptomycin.  Renal function continues to improve and weight has been updated.    Assessment/Plan:  Continue Daptomycin  (remains 8 mg/kg) IV q24h for CrCl > 30 ml/min (SCr continues to slowly improve)  Check weekly CK level on 1/31  Continue to follow up renal fxn, culture results, and clinical course.   Temp (24hrs), Avg:98.1 F (36.7 C), Min:97.6 F (36.4 C), Max:98.5 F (36.9 C)   Recent Labs Lab 06/25/15 0425 06/26/15 0425 06/27/15 0510 06/28/15 0446 06/29/15 0447  WBC 1.9* 2.0* 2.3* 2.5* 3.0*     Recent Labs Lab 06/26/15 0425 06/27/15 0510 06/28/15 0446 06/29/15 0447 06/30/15 0627  CREATININE 3.28* 3.16* 2.95* 2.63* 2.49*   Estimated Creatinine Clearance: 44.4 mL/min (by C-G formula based on Cr of 2.49).    Allergies  Allergen Reactions  . Hydrocodone Hives and Itching  . Oxycodone     Mild Itching, patient can tolerate oxycodone  . Percocet [Oxycodone-Acetaminophen]     Itching, patient says he tolerates    Antimicrobials this admission: 1/5 >> Ceftriaxone >>1/10  1/5 >> Vancomycin >> 1.24  1/5 >> Acyclovir >> 1/8 1/24 >> daptomycin >>   Levels/dose changes this admission: 1/25 0850 random vanco = 58mcg/ml 1/27 Daptomycin dose decreased to  (remains 8 mg/kg) based on updated weight 94 kg.  Microbiology results: 1/5 BCx: NGTD 1/5 CSF culture: CoNS 1/24 urine: NG 1/24 CSF: NGTD (Nothing seen on GS) 1/24 blood: NGTD 1/24 CDF fungus: pending for 4 weeks   Hessie Knows, PharmD, BCPS Pager [phone] 06/30/2015 8:11 AM

## 2015-06-30 NOTE — Progress Notes (Signed)
[facility]    Date of Admission:  06/24/2015   Total days of antibiotics 26        Day 7 dapto QD           ID: Bruce Morales is a 40 y.o. 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  Principal Problem:   Acute renal failure (ARF) (HCC) Active Problems:   Bilateral low back pain without sciatica   Morbid obesity (HCC)   Headache   Recent history of Staphylococcal meningitis   Status post lumbar laminectomy   Pancytopenia (HCC)   Bone marrow suppression   Thrombocytopenia (HCC)    Subjective: Afebrile,   Interval hx: continues to have improvement with cr. Improving wbc and plt  Medications:  . acetaminophen  1,000 mg Oral TID  . citalopram  10 mg Oral Daily  . DAPTOmycin (CUBICIN)  IV  750 mg Intravenous Q24H  . oxyCODONE  20 mg Oral Q12H  . polyethylene glycol  17 g Oral Daily  . senna-docusate  1 tablet Oral QHS  . sodium chloride flush  10-40 mL Intracatheter Q12H  . sodium chloride flush  3 mL Intravenous Q12H    Objective: Vital signs in last 24 hours: Temp:  [97.6 F (36.4 C)-98.5 F (36.9 C)] 98 F (36.7 C) (01/30 1223) Pulse Rate:  [65-96] 65 (01/30 1223) Resp:  [18] 18 (01/30 1223) BP: (124-152)/(81-91) 124/85 mmHg (01/30 1223) SpO2:  [96 %-97 %] 97 % (01/30 1223) Physical Exam  Constitutional: He is oriented to person, place, and time. He appears well-developed and well-nourished. No distress. Laying in bed HENT:  Mouth/Throat: Oropharynx is clear and moist. No oropharyngeal exudate.  Cardiovascular: Normal rate, regular rhythm and normal heart sounds. Exam reveals no gallop and no friction rub.  No murmur heard.  Pulmonary/Chest: Effort normal and breath sounds normal. No respiratory distress. He has no wheezes.  Abdominal: Soft. Bowel sounds are normal. He exhibits no distension. There is no tenderness.    Lymphadenopathy:  He has no cervical adenopathy.  Neurological: He is alert and oriented to person, place, and time.  Skin: Skin is warm and dry. No rash noted. No erythema.  Psychiatric: He has a normal mood and affect. His behavior is normal.     Lab Results  Recent Labs  06/28/15 0446 06/29/15 0447 06/30/15 0627  WBC 2.5* 3.0*  --   HGB 9.6* 9.8*  --   HCT 28.7* 29.1*  --   NA 143 144 144  K 4.2 4.2 4.0  CL 108 109 109  CO2 BUN 25* 20 17  CREATININE 2.95* 2.63* 2.49*   Liver Panel No results for input(s): PROT, ALBUMIN, AST, ALT, ALKPHOS, BILITOT, BILIDIR, IBILI in the last 72 hours.  Microbiology: 1/24 csf NGTD 1/24 blood cx x 2 NGTD  Studies/Results: No results found.   Assessment/Plan: MRSE meningitis complicated post surgical lumbar surgery infection - currently daptomycin /kg Q daily due to improving Creatinine. Will need baseline CK and weekly CK while on daptomycin. Still plan to have end date of Jul 17, 2015. Plan to discharge home on daptomycin.  AKI from vancomycin toxicity = thought to be ATN. Cr now 2.49 today. Anticipate to continue to improve. Will check Cr twice a week to ensure continued improvement  Leukopenia/thrombocytopenia = likely related to vancomycin. improving  Chills = now resolved, repeat blood cx  NGTD. Possibly related to vanco toxicity as well  Back pain = likely still residual from initial injuries/surgery. I spoke with Dr. Yetta Barre this afternoon, and we will plan to reassess at end of treatment in roughly 2 wk to see if need to repeat imaging of spine. At this time, we will avoid any contrast to avoid nephrotoxicity  Lymphocytic pleocytosis in CSF = expected to see lymphocytic predominance, unclear if would see any change in total cell count. For now, appears to be improving headache. Continue with daptomycin. fungal cx pending.   Will have patient be seen by Dr. Orvan Falconer in 2 wk. From ID standpoint, can go home if has  adequate pain management  Home health orders are listed below---------------------- Diagnosis:  MRSE meningitis  Culture Result: MRSE  Allergies  Allergen Reactions  . Hydrocodone Hives and Itching  . Oxycodone     Mild Itching, patient can tolerate oxycodone  . Percocet [Oxycodone-Acetaminophen]     Itching, patient says he tolerates    Discharge antibiotics: Per pharmacy protocol  daptomycin  Duration: 17 days  End Date: Jul 17, 2015   [facility] Per Protocol:  Labs weekly while on IV antibiotics: _x CBC - weekly _x_ BMP - twice a week _X_ CK weekly (next due on 1/31)  Fax weekly labs to [phone]  Clinic Follow Up Appt: In 2 wk with Dr. Thomasena Edis, [facility] Cell: [phone] Pager: [phone]  06/30/2015, 1:57 PM

## 2015-06-30 NOTE — Progress Notes (Signed)
Patient decline air mattress for home.  Notified the case manager via vm.  No other DME needs.

## 2015-07-01 DIAGNOSIS — N179 Acute kidney failure, unspecified: Secondary | ICD-10-CM | POA: Insufficient documentation

## 2015-07-02 NOTE — Progress Notes (Signed)
Per patient's Hospitalist Dr. Ramiro Harvest, his insurance has denied patient coverage on his prescription for Compazine tablets. This Case Manager spoke with patient's MD Dr. Ramiro Harvest who states that the patient needs the Compazine as he has Bacterial Meningitis and the Compazine has been successful in decreasing patient's symptoms:  (headaches).  The patient is currently on IV antibiotics for the Bacterial Meningitis as well.  Sandford Craze RN,BSN,NCM

## 2015-07-08 ENCOUNTER — Encounter: Payer: Self-pay | Admitting: Internal Medicine

## 2015-07-09 ENCOUNTER — Ambulatory Visit: Payer: 59

## 2015-07-12 ENCOUNTER — Other Ambulatory Visit: Payer: Self-pay | Admitting: Family Medicine

## 2015-07-12 ENCOUNTER — Ambulatory Visit (INDEPENDENT_AMBULATORY_CARE_PROVIDER_SITE_OTHER): Payer: 59 | Admitting: Family Medicine

## 2015-07-12 VITALS — BP 110/86 | HR 93 | Temp 99.4°F | Resp 16 | Ht 67.5 in | Wt 198.4 lb

## 2015-07-12 DIAGNOSIS — R195 Other fecal abnormalities: Secondary | ICD-10-CM | POA: Diagnosis not present

## 2015-07-12 DIAGNOSIS — M545 Low back pain, unspecified: Secondary | ICD-10-CM

## 2015-07-12 DIAGNOSIS — N179 Acute kidney failure, unspecified: Secondary | ICD-10-CM | POA: Diagnosis not present

## 2015-07-12 DIAGNOSIS — G4700 Insomnia, unspecified: Secondary | ICD-10-CM | POA: Diagnosis not present

## 2015-07-12 DIAGNOSIS — R6883 Chills (without fever): Secondary | ICD-10-CM

## 2015-07-12 DIAGNOSIS — G009 Bacterial meningitis, unspecified: Secondary | ICD-10-CM | POA: Diagnosis not present

## 2015-07-12 LAB — POCT URINALYSIS DIP (MANUAL ENTRY)
Bilirubin, UA: NEGATIVE
Glucose, UA: NEGATIVE
Ketones, POC UA: NEGATIVE
Leukocytes, UA: NEGATIVE
Nitrite, UA: NEGATIVE
Protein Ur, POC: NEGATIVE
Spec Grav, UA: <=1.005
Urobilinogen, UA: 0.2
pH, UA: 6

## 2015-07-12 LAB — POCT CBC
Granulocyte percent: 67.2 %G (ref 37–80)
HCT, POC: 30.9 % — AB (ref 43.5–53.7)
Hemoglobin: 10.9 g/dL — AB (ref 14.1–18.1)
Lymph, poc: 1.7 (ref 0.6–3.4)
MCH, POC: 29.1 pg (ref 27–31.2)
MCHC: 35.1 g/dL (ref 31.8–35.4)
MCV: 82.7 fL (ref 80–97)
MID (cbc): 0.5 (ref 0–0.9)
MPV: 7.8 fL (ref 0–99.8)
POC Granulocyte: 4.4 (ref 2–6.9)
POC LYMPH PERCENT: 25.5 %L (ref 10–50)
POC MID %: 7.3 %M (ref 0–12)
Platelet Count, POC: 169 10*3/uL (ref 142–424)
RBC: 3.74 M/uL — AB (ref 4.69–6.13)
RDW, POC: 12.9 %
WBC: 6.6 10*3/uL (ref 4.6–10.2)

## 2015-07-12 LAB — POC MICROSCOPIC URINALYSIS (UMFC): Mucus: ABSENT

## 2015-07-12 LAB — COMPLETE METABOLIC PANEL WITH GFR
ALT: 27 U/L (ref 9–46)
AST: 22 U/L (ref 10–40)
Albumin: 4.3 g/dL (ref 3.6–5.1)
Alkaline Phosphatase: 95 U/L (ref 40–115)
BUN: 19 mg/dL (ref 7–25)
CO2: 22 mmol/L (ref 20–31)
Calcium: 9.1 mg/dL (ref 8.6–10.3)
Chloride: 104 mmol/L (ref 98–110)
Creat: 1.73 mg/dL — ABNORMAL HIGH (ref 0.60–1.35)
GFR, Est African American: 57 mL/min — ABNORMAL LOW (ref >=60)
GFR, Est Non African American: 49 mL/min — ABNORMAL LOW (ref >=60)
Glucose, Bld: 106 mg/dL — ABNORMAL HIGH (ref 65–99)
Potassium: 3.4 mmol/L — ABNORMAL LOW (ref 3.5–5.3)
Sodium: 138 mmol/L (ref 135–146)
Total Bilirubin: 0.7 mg/dL (ref 0.2–1.2)
Total Protein: 7 g/dL (ref 6.1–8.1)

## 2015-07-12 MED ORDER — LACTULOSE 10 GM/15ML PO SOLN: 3.3333 g | Freq: Every day | ORAL | Status: DC | PRN

## 2015-07-12 MED ORDER — ZOLPIDEM TARTRATE 5 MG PO TABS: 5.0000 mg | ORAL_TABLET | Freq: Every evening | ORAL | Status: DC | PRN

## 2015-07-12 NOTE — Progress Notes (Signed)
Chief Complaint:  Chief Complaint  Patient presents with  . Follow-up  . Meningitis    HPI: Bruce Morales is a 40 y.o. male who reports to [facility] today complaining of insomnia He was on oxycontin and also morphine, he has some gastric pain, he has some kidney pain, he has  neck pain. Gastric pains come and goes , he has had 2 bouts of loose nonbloody stools/diarrhea; he has passed gas , he is also on lactulose for constipation  He was given Oxycontin 20 mg and also Morphine 15 mg in the hospital, he was tapered off if it and is trying not to be on such high doses. He feels he may be having similar sxs of meningitis as before.  He is on IV abx, he was admitted recently for ARF due to IV abx and is now on a different abx and is having his creatinine monitored. He thinks it was normal on last visit He had blood work done yesterday by home health nurse.  He has not been sleeping at night and goes to sleep 9-10 in the morning.  He was given ambien in the hospital   He was recently dc from the hospital. He had meningitis. DC notes below.    Discharge Diagnoses:  Principal Problem:  Acute renal failure (ARF) (HCC) Active Problems:  Bilateral low back pain without sciatica  Morbid obesity (HCC)  Headache  Recent history of Staphylococcal meningitis  Status post lumbar laminectomy  Pancytopenia (HCC)  Bone marrow suppression  Thrombocytopenia (HCC)  Past Medical History  Diagnosis Date  . Gall stones   . Chronic lower back pain   . Anginal pain (HCC) 04/15/2014  . Chronic bronchitis (HCC)     "probably get it q yr"  . Arthritis     "wrists, knees, lower back" (04/16/2014)  . Chronic lower back pain    Past Surgical History  Procedure Laterality Date  . Cholecystectomy  2011  . Knee arthroscopy Right 2013 X 2  . Esophagogastroduodenoscopy N/A 02/27/2013    Procedure: ESOPHAGOGASTRODUODENOSCOPY (EGD);  Surgeon: Florencia Reasons, MD;  Location: [facility];   Service: Endoscopy;  Laterality: N/A;  . Left heart catheterization with coronary angiogram N/A 04/17/2014    Procedure: LEFT HEART CATHETERIZATION WITH CORONARY ANGIOGRAM;  Surgeon: Ricki Rodriguez, MD;  Location: [facility] CATH LAB;  Service: Cardiovascular;  Laterality: N/A;  . Radiology with anesthesia N/A 06/11/2015    Procedure: MRI LUMBAR WITH AND WITHOUT CONTRAST;  Surgeon: Medication Radiologist, MD;  Location: [facility] OR;  Service: Radiology;  Laterality: N/A;   Social History   Social History  . Marital Status: Married    Spouse Name: N/A  . Number of Children: N/A  . Years of Education: N/A   Social History Main Topics  . Smoking status: Former Smoker -- 0.50 packs/day for 27 years    Types: Cigarettes    Quit date: 03/11/2015  . Smokeless tobacco: Former Neurosurgeon  . Alcohol Use: 0.0 oz/week    0 Standard drinks or equivalent per week     Comment: 04/16/2014 "a 6 pack will last me a month"  . Drug Use: Yes     Comment: 04/16/2014 "in my teens"  . Sexual Activity: Yes   Other Topics Concern  . None   Social History Narrative   Drinks 2 cups of coffee in the morning.   Family History  Problem Relation Age of Onset  . Multiple sclerosis Mother   .  Heart failure Maternal Grandfather   . Diabetes Father   . Fibromyalgia Sister   . Schizophrenia Brother   . Stroke Maternal Grandmother   . Diabetes Paternal Grandmother    Allergies  Allergen Reactions  . Hydrocodone Hives and Itching  . Oxycodone     Mild Itching, patient can tolerate oxycodone  . Percocet [Oxycodone-Acetaminophen]     Itching, patient says he tolerates   Prior to Admission medications   Medication Sig Start Date End Date Taking? Authorizing Provider  DAPTOmycin 750 mg in sodium chloride 0.9 % 100 mL Inject 750 mg into the vein daily. 07/01/15 07/18/15 Yes Rodolph Bong, MD  lactulose (CHRONULAC) 10 GM/15ML solution Take 5-10 mLs by mouth daily as needed for mild constipation.  06/03/15  Yes Historical  Provider, MD  prochlorperazine (COMPAZINE) 10 MG tablet Take 1 tablet (10 mg total) by mouth every 6 (six) hours as needed for nausea or vomiting. 06/30/15  Yes Rodolph Bong, MD  promethazine (PHENERGAN) 25 MG tablet Take 1 tablet (25 mg total) by mouth every 8 (eight) hours as needed for nausea or vomiting. 06/04/15  Yes Thao P Le, DO  senna-docusate (SENOKOT-S) 8.6-50 MG tablet Take 1 tablet by mouth at bedtime. 06/30/15  Yes Rodolph Bong, MD  albuterol (PROVENTIL HFA;VENTOLIN HFA) 108 (90 BASE) MCG/ACT inhaler Inhale 2 puffs into the lungs every 4 (four) hours as needed (cough, shortness of breath or wheezing.). Patient not taking: Reported on 07/12/2015 04/30/15   Collie Siad English, PA  morphine (MSIR) 15 MG tablet Take 1 tablet (15 mg total) by mouth every 4 (four) hours as needed for severe pain. Patient not taking: Reported on 07/12/2015 06/30/15   Rodolph Bong, MD  oxyCODONE (OXYCONTIN) 20 mg 12 hr tablet Take 1 tablet (20 mg total) by mouth every 12 (twelve) hours. Patient not taking: Reported on 07/12/2015 06/30/15   Rodolph Bong, MD  polyethylene glycol [facility] / Ethelene Hal) packet Take 17 g by mouth daily. Patient not taking: Reported on 07/12/2015 06/30/15   Rodolph Bong, MD     ROS: The patient denies night sweats, unintentional weight loss, chest pain, palpitations, wheezing, dyspnea on exertion, nausea, vomiting, abdominal pain, dysuria, hematuria, melena  All other systems have been reviewed and were otherwise negative with the exception of those mentioned in the HPI and as above.    PHYSICAL EXAM: Filed Vitals:   07/12/15 0816  BP: 110/86  Pulse: 93  Temp: 99.4 F (37.4 C)  Resp: 16   Body mass index is 30.6 kg/(m^2).   General: Alert, no acute distress HEENT:  Normocephalic, atraumatic, oropharynx patent. EOMI, PERRLA, fundo exam normal  Cardiovascular:  Regular rate and rhythm, no rubs murmurs or gallops.   Respiratory: Clear to auscultation  bilaterally.  No wheezes, rales, or rhonchi.  No cyanosis, no use of accessory musculature Abdominal: No organomegaly, abdomen is soft and non-tender, positive bowel sounds. No masses. Skin: No rashes. Surgical scar in back is healed and shows no e/o infection Neurologic: Facial musculature symmetric. Psychiatric: Patient acts appropriately throughout our interaction. Lymphatic: No cervical or submandibular lymphadenopathy Musculoskeletal: Gait intact. No edema, tenderness   LABS: Results for orders placed or performed in visit on 07/12/15  POCT CBC  Result Value Ref Range   WBC 6.6 4.6 - 10.2 K/uL   Lymph, poc 1.7 0.6 - 3.4   POC LYMPH PERCENT 25.5 10 - 50 %L   MID (cbc) 0.5 0 - 0.9   POC MID %  7.3 0 - 12 %M   POC Granulocyte 4.4 2 - 6.9   Granulocyte percent 67.2 37 - 80 %G   RBC 3.74 (A) 4.69 - 6.13 M/uL   Hemoglobin 10.9 (A) 14.1 - 18.1 g/dL   HCT, POC 21.3 (A) 08.6 - 53.7 %   MCV 82.7 80 - 97 fL   MCH, POC 29.1 27 - 31.2 pg   MCHC 35.1 31.8 - 35.4 g/dL   RDW, POC 57.8 %   Platelet Count, POC 169 142 - 424 K/uL   MPV 7.8 0 - 99.8 fL  POCT urinalysis dipstick  Result Value Ref Range   Color, UA yellow yellow   Clarity, UA clear clear   Glucose, UA negative negative   Bilirubin, UA negative negative   Ketones, POC UA negative negative   Spec Grav, UA <=1.005    Blood, UA trace-lysed (A) negative   pH, UA 6.0    Protein Ur, POC negative negative   Urobilinogen, UA 0.2    Nitrite, UA Negative Negative   Leukocytes, UA Negative Negative  POCT Microscopic Urinalysis ([facility])  Result Value Ref Range   WBC,UR,HPF,POC None None WBC/hpf   RBC,UR,HPF,POC None None RBC/hpf   Bacteria None None, Too numerous to count   Mucus Absent Absent   Epithelial Cells, UR Per Microscopy Few (A) None, Too numerous to count cells/hpf     EKG/XRAY:   Primary read interpreted by Dr. Conley Rolls at [facility].   ASSESSMENT/PLAN: Encounter Diagnoses  Name Primary?  . Bacterial meningitis Yes  .  Acute renal failure, unspecified acute renal failure type (HCC)   . Chills   . Loose stools   . Midline low back pain without sciatica   . Insomnia     I have advised him to fu with his neurosurgeon for pain meds adjustement, he can taper off if he wants to  Rx ambien for sleep since that is his primary compalint today, he may have rebound HA from not taking high dose pain meds.  Stool cx pending, C diff since been on IV abx but likely loose stools due to lactulose Will await for labs Possible renal stone with micro hematuria, if worsening sxs then get CT scan  Fu pn   Gross sideeffects, risk and benefits, and alternatives of medications d/w patient. Patient is aware that all medications have potential sideeffects and we are unable to predict every sideeffect or drug-drug interaction that may occur.  Thao Le DO  07/12/2015 9:53 AM   Spoke to patient about labs, need to increase potassium rich foods naturally, no supplement at this time.  Push fluids. His creatinine is higher than what was drawn byt he home health nurse but trending down from prior hospital stay. I have advised him to fu with her again since he will get it drawn again

## 2015-07-13 LAB — C. DIFFICILE GDH AND TOXIN A/B
C. difficile GDH: NOT DETECTED
C. difficile Toxin A/B: NOT DETECTED

## 2015-07-15 ENCOUNTER — Ambulatory Visit (INDEPENDENT_AMBULATORY_CARE_PROVIDER_SITE_OTHER): Payer: Worker's Compensation | Admitting: Internal Medicine

## 2015-07-15 VITALS — BP 114/79 | HR 65 | Temp 98.0°F | Ht 67.0 in | Wt 199.0 lb

## 2015-07-15 DIAGNOSIS — N179 Acute kidney failure, unspecified: Secondary | ICD-10-CM

## 2015-07-15 DIAGNOSIS — D7589 Other specified diseases of blood and blood-forming organs: Secondary | ICD-10-CM

## 2015-07-15 DIAGNOSIS — G003 Staphylococcal meningitis: Secondary | ICD-10-CM

## 2015-07-15 LAB — OVA AND PARASITE EXAMINATION: OP: NONE SEEN

## 2015-07-15 MED ORDER — SODIUM CHLORIDE 0.9 % IV SOLN: 750.0000 mg | INTRAVENOUS | Status: AC

## 2015-07-15 NOTE — Assessment & Plan Note (Signed)
His kidney function continues to improve. His creatinine on 07/11/2015 was 0.67 but he states that his primary care doctor, Dr. Conley Rolls repeated it a day later and it was 1.7. This is still a steady improvement over the past several weeks and I expect that his renal function will turn to normal with further time off of vancomycin.

## 2015-07-15 NOTE — Progress Notes (Signed)
[facility]  Patient Active Problem List   Diagnosis Date Noted  . Status post lumbar laminectomy 06/10/2015    Priority: High  . Recent history of Staphylococcal meningitis 06/05/2015    Priority: High  . Acute kidney injury (HCC)     Priority: Medium  . Acute renal failure (ARF) (HCC) 06/24/2015    Priority: Medium  . Bone marrow suppression   . Thrombocytopenia (HCC)   . Pancytopenia (HCC) 06/24/2015  . Headache 06/05/2015  . Morbid obesity (HCC) 11/13/2014  . Bilateral low back pain without sciatica 04/14/2014    Patient's Medications  New Prescriptions   No medications on file  Previous Medications   ALBUTEROL (PROVENTIL HFA;VENTOLIN HFA) 108 (90 BASE) MCG/ACT INHALER    Inhale 2 puffs into the lungs every 4 (four) hours as needed (cough, shortness of breath or wheezing.).   DOXYLAMINE, SLEEP, (UNISOM) 25 MG TABLET    Take 25 mg by mouth at bedtime as needed.   LACTULOSE (CHRONULAC) 10 GM/15ML SOLUTION    Take 5-10 mLs (3.3333-6.6667 g total) by mouth daily as needed for moderate constipation.   OXYCODONE (OXY-IR) 5 MG CAPSULE    Take 5 mg by mouth every 4 (four) hours as needed.   POLYETHYLENE GLYCOL (MIRALAX / GLYCOLAX) PACKET    Take 17 g by mouth daily.   PROCHLORPERAZINE (COMPAZINE) 10 MG TABLET    Take 1 tablet (10 mg total) by mouth every 6 (six) hours as needed for nausea or vomiting.   PROMETHAZINE (PHENERGAN) 25 MG TABLET    Take 1 tablet (25 mg total) by mouth every 8 (eight) hours as needed for nausea or vomiting.   SENNA-DOCUSATE (SENOKOT-S) 8.6-50 MG TABLET    Take 1 tablet by mouth at bedtime.   ZOLPIDEM (AMBIEN) 5 MG TABLET    Take 1 tablet (5 mg total) by mouth at bedtime as needed for sleep.  Modified Medications   Modified Medication Previous Medication   DAPTOMYCIN 750 MG IN SODIUM CHLORIDE 0.9 % 100 ML DAPTOmycin 750 mg in sodium chloride 0.9 % 100 mL      Inject 750 mg into the vein daily.    Inject 750 mg into  the vein daily.  Discontinued Medications   MORPHINE (MSIR) 15 MG TABLET    Take 1 tablet (15 mg total) by mouth every 4 (four) hours as needed for severe pain.   OXYCODONE (OXYCONTIN) 20 MG 12 HR TABLET    Take 1 tablet (20 mg total) by mouth every 12 (twelve) hours.    Subjective: Bruce Morales is in for his hospital follow-up visit. He developed methicillin-resistant coagulase-negative staph meningitis complicating postoperative lumbar infection and was hospitalized last month. He was discharged on IV vancomycin but developed pancytopenia and acute renal insufficiency. He was rehospitalized from 06/24/2015 to 06/30/2015. He was changed to IV daptomycin. He is now completed 40 days of antibiotic therapy. He has had no problems tolerating daptomycin or his PICC. His leukopenia and thrombocytopenia have resolved. He continues to have a moderate normocytic anemia. His renal function has been improving. His back pain and headache are markedly improved. He is now only taking immediate release oxycodone 5 mg, 1-2 tablets several times daily. He is beginning to resume his usual activities.  Review of Systems: Review of Systems  Constitutional: Positive for malaise/fatigue. Negative for fever, chills, weight loss and diaphoresis.  HENT: Negative for sore throat.   Respiratory: Negative for cough, sputum  production and shortness of breath.   Cardiovascular: Negative for chest pain.  Gastrointestinal: Negative for nausea, vomiting and diarrhea.  Musculoskeletal: Positive for back pain.  Skin: Negative for rash.  Neurological: Positive for weakness and headaches. Negative for sensory change and focal weakness.  Psychiatric/Behavioral: Negative for depression and substance abuse.    Past Medical History  Diagnosis Date  . Gall stones   . Chronic lower back pain   . Anginal pain (HCC) 04/15/2014  . Chronic bronchitis (HCC)     "probably get it q yr"  . Arthritis     "wrists, knees, lower back"  (04/16/2014)  . Chronic lower back pain     Social History  Substance Use Topics  . Smoking status: Former Smoker -- 0.50 packs/day for 27 years    Types: Cigarettes    Quit date: 03/11/2015  . Smokeless tobacco: Former Neurosurgeon  . Alcohol Use: 0.0 oz/week    0 Standard drinks or equivalent per week     Comment: 04/16/2014 "a 6 pack will last me a month"    Family History  Problem Relation Age of Onset  . Multiple sclerosis Mother   . Heart failure Maternal Grandfather   . Diabetes Father   . Fibromyalgia Sister   . Schizophrenia Brother   . Stroke Maternal Grandmother   . Diabetes Paternal Grandmother     Allergies  Allergen Reactions  . Hydrocodone Hives and Itching  . Oxycodone     Mild Itching, patient can tolerate oxycodone  . Percocet [Oxycodone-Acetaminophen]     Itching, patient says he tolerates    Objective: Filed Vitals:   07/15/15 0950  BP: 114/79  Pulse: 65  Temp: 98 F (36.7 C)  TempSrc: Oral  Height:  (1.702 m)  Weight: 199 lb (90.266 kg)   Body mass index is 31.16 kg/(m^2).  Physical Exam  Constitutional: He is oriented to person, place, and time. No distress.  He looks much better than when I last saw him. He is very talkative today.  HENT:  Mouth/Throat: No oropharyngeal exudate.  Eyes: Conjunctivae are normal.  Neck: Neck supple.  Cardiovascular: Normal rate and regular rhythm.   No murmur heard. Pulmonary/Chest: Breath sounds normal.  Abdominal: Soft. There is no tenderness.  Musculoskeletal: Normal range of motion.  Neurological: He is alert and oriented to person, place, and time. Gait normal.  Skin: No rash noted.  He is pale. Right arm PICC site looks normal.  Psychiatric: Mood and affect normal.    Lab Results    Problem List Items Addressed This Visit      High   Recent history of Staphylococcal meningitis - Primary    He is doing much better and I am hopeful that his lumbar infection and meningitis will be cured  following 2 more days of daptomycin therapy. He will have the PICC line pulled on 07/17/2015 and follow-up with me in one month.      Relevant Medications   DAPTOmycin 750 mg in sodium chloride 0.9 % 100 mL     Medium   Acute kidney injury (HCC)    His kidney function continues to improve. His creatinine on 07/11/2015 was 0.67 but he states that his primary care doctor, Dr. Conley Rolls repeated it a day later and it was 1.7. This is still a steady improvement over the past several weeks and I expect that his renal function will turn to normal with further time off of vancomycin.  Unprioritized   Bone marrow suppression    His bone marrow suppression is resolving off of vancomycin.          Cliffton Asters, MD [facility] [phone] pager   [phone] cell 07/15/2015, 10:17 AM

## 2015-07-15 NOTE — Assessment & Plan Note (Signed)
He is doing much better and I am hopeful that his lumbar infection and meningitis will be cured following 2 more days of daptomycin therapy. He will have the PICC line pulled on 07/17/2015 and follow-up with me in one month.

## 2015-07-15 NOTE — Assessment & Plan Note (Signed)
His bone marrow suppression is resolving off of vancomycin.

## 2015-07-16 LAB — STOOL CULTURE

## 2015-07-21 ENCOUNTER — Encounter: Payer: Self-pay | Admitting: Internal Medicine

## 2015-07-23 LAB — FUNGUS CULTURE W SMEAR
Fungal Smear: NONE SEEN
Special Requests: NORMAL

## 2015-08-14 ENCOUNTER — Ambulatory Visit (INDEPENDENT_AMBULATORY_CARE_PROVIDER_SITE_OTHER): Payer: Worker's Compensation | Admitting: Internal Medicine

## 2015-08-14 ENCOUNTER — Encounter: Payer: Self-pay | Admitting: Internal Medicine

## 2015-08-14 DIAGNOSIS — G003 Staphylococcal meningitis: Secondary | ICD-10-CM

## 2015-08-14 LAB — CBC
HCT: 34.3 % — ABNORMAL LOW (ref 39.0–52.0)
Hemoglobin: 11.1 g/dL — ABNORMAL LOW (ref 13.0–17.0)
MCH: 28.1 pg (ref 26.0–34.0)
MCHC: 32.4 g/dL (ref 30.0–36.0)
MCV: 86.8 fL (ref 78.0–100.0)
MPV: 10.8 fL (ref 8.6–12.4)
Platelets: 171 10*3/uL (ref 150–400)
RBC: 3.95 MIL/uL — ABNORMAL LOW (ref 4.22–5.81)
RDW: 15.6 % — ABNORMAL HIGH (ref 11.5–15.5)
WBC: 5.4 10*3/uL (ref 4.0–10.5)

## 2015-08-14 LAB — COMPREHENSIVE METABOLIC PANEL
ALT: 23 U/L (ref 9–46)
AST: 22 U/L (ref 10–40)
Albumin: 4.2 g/dL (ref 3.6–5.1)
Alkaline Phosphatase: 52 U/L (ref 40–115)
BUN: 17 mg/dL (ref 7–25)
CO2: 27 mmol/L (ref 20–31)
Calcium: 9.3 mg/dL (ref 8.6–10.3)
Chloride: 106 mmol/L (ref 98–110)
Creat: 1.16 mg/dL (ref 0.60–1.35)
Glucose, Bld: 87 mg/dL (ref 65–99)
Potassium: 4.5 mmol/L (ref 3.5–5.3)
Sodium: 141 mmol/L (ref 135–146)
Total Bilirubin: 0.7 mg/dL (ref 0.2–1.2)
Total Protein: 6.7 g/dL (ref 6.1–8.1)

## 2015-08-14 LAB — C-REACTIVE PROTEIN: CRP: <0.5 mg/dL (ref <0.60)

## 2015-08-14 NOTE — Progress Notes (Signed)
[facility]  Patient Active Problem List   Diagnosis Date Noted  . Status post lumbar laminectomy 06/10/2015    Priority: High  . Recent history of Staphylococcal meningitis 06/05/2015    Priority: High  . Acute kidney injury (HCC)     Priority: Medium  . Acute renal failure (ARF) (HCC) 06/24/2015    Priority: Medium  . Bone marrow suppression   . Thrombocytopenia (HCC)   . Pancytopenia (HCC) 06/24/2015  . Headache 06/05/2015  . Morbid obesity (HCC) 11/13/2014  . Bilateral low back pain without sciatica 04/14/2014    Patient's Medications  New Prescriptions   No medications on file  Previous Medications   DOXYLAMINE, SLEEP, (UNISOM) 25 MG TABLET    Take 25 mg by mouth at bedtime as needed. Reported on 08/14/2015   HYDROMORPHONE (DILAUDID) 2 MG TABLET    Take by mouth every 4 (four) hours as needed for severe pain (Take 1-2 every 4 hours as needed).   LACTULOSE (CHRONULAC) 10 GM/15ML SOLUTION    Take 5-10 mLs (3.3333-6.6667 g total) by mouth daily as needed for moderate constipation.  Modified Medications   No medications on file  Discontinued Medications   ALBUTEROL (PROVENTIL HFA;VENTOLIN HFA) 108 (90 BASE) MCG/ACT INHALER    Inhale 2 puffs into the lungs every 4 (four) hours as needed (cough, shortness of breath or wheezing.).   HYDROMORPHONE (DILAUDID) 4 MG TABLET    Take 4 mg by mouth every 4 (four) hours as needed for severe pain.   OXYCODONE (OXY-IR) 5 MG CAPSULE    Take 5 mg by mouth every 4 (four) hours as needed.   POLYETHYLENE GLYCOL (MIRALAX / GLYCOLAX) PACKET    Take 17 g by mouth daily.   PROCHLORPERAZINE (COMPAZINE) 10 MG TABLET    Take 1 tablet (10 mg total) by mouth every 6 (six) hours as needed for nausea or vomiting.   PROMETHAZINE (PHENERGAN) 25 MG TABLET    Take 1 tablet (25 mg total) by mouth every 8 (eight) hours as needed for nausea or vomiting.   SENNA-DOCUSATE (SENOKOT-S) 8.6-50 MG TABLET    Take 1 tablet by mouth at  bedtime.   ZOLPIDEM (AMBIEN) 5 MG TABLET    Take 1 tablet (5 mg total) by mouth at bedtime as needed for sleep.    Subjective: Bruce Morales is in for his routine follow-up visit. 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 Overall he is feeling better although he continues to have severe back pain that requires 4-52 mg.allotted daily. He has tried to be a little bit more active recently and will be starting physical therapy soon.  Review of Systems: Review of Systems  Constitutional: Negative for fever, chills, weight loss, malaise/fatigue and diaphoresis.  HENT: Positive for hearing loss. Negative for sore throat and tinnitus.        He has noted that it is a little more difficult to hear out of his right ear in the past 2 months.  Respiratory: Negative for cough, sputum production and shortness of breath.   Cardiovascular: Negative for chest pain.  Gastrointestinal: Negative for nausea, vomiting and diarrhea.  Genitourinary: Negative for dysuria and frequency.  Musculoskeletal: Positive for back pain. Negative for myalgias, joint pain and neck pain.  Skin: Negative for rash.  Neurological: Negative for focal weakness and headaches.    Past Medical History  Diagnosis Date  . Gall  stones   . Chronic lower back pain   . Anginal pain (HCC) 04/15/2014  . Chronic bronchitis (HCC)     "probably get it q yr"  . Arthritis     "wrists, knees, lower back" (04/16/2014)  . Chronic lower back pain     Social History  Substance Use Topics  . Smoking status: Former Smoker -- 0.50 packs/day for 27 years    Types: Cigarettes    Quit date: 03/11/2015  . Smokeless tobacco: Former Neurosurgeon  . Alcohol Use: 0.0 oz/week    0 Standard drinks or equivalent per week     Comment: 04/16/2014 "a 6 pack will last me a month"    Family History  Problem Relation Age of Onset  . Multiple sclerosis Mother   . Heart failure Maternal  Grandfather   . Diabetes Father   . Fibromyalgia Sister   . Schizophrenia Brother   . Stroke Maternal Grandmother   . Diabetes Paternal Grandmother     Allergies  Allergen Reactions  . Hydrocodone Hives and Itching  . Oxycodone     Mild Itching, patient can tolerate oxycodone  . Percocet [Oxycodone-Acetaminophen]     Itching, patient says he tolerates    Objective: Filed Vitals:   08/14/15 1515  BP: 123/79  Pulse: 73  Temp: 98.2 F (36.8 C)  TempSrc: Oral  Height: 5' 8.5" (1.74 m)  Weight: 215 lb (97.523 kg)   Body mass index is 32.21 kg/(m^2).  Physical Exam  Constitutional: He is oriented to person, place, and time. No distress.  He is very talkative and in good spirits.  HENT:  Right Ear: External ear normal.  Left Ear: External ear normal.  Mouth/Throat: No oropharyngeal exudate.  Cardiovascular: Regular rhythm.   No murmur heard. Pulmonary/Chest: Breath sounds normal.  Musculoskeletal:  He can stand and walk without obvious discomfort.  Neurological: He is alert and oriented to person, place, and time. Gait normal.  Skin: No rash noted.  Psychiatric: Mood and affect normal.    Lab Results SED RATE (mm/hr)  Date Value  06/11/2015 15  05/16/2015 29*   CRP (mg/dL)  Date Value  16/03/9603 0.6  05/16/2015 5.0*     Problem List Items Addressed This Visit      High   Recent history of Staphylococcal meningitis    I suspect that his infection has been cured. I will repeat his inflammatory markers and continue observation off of antibiotics. When he was receiving IV vancomycin he developed thrombocytopenia and nephrotoxicity. Those were both improving at the time of his last visit. I will recheck his platelet count and renal function today. He will follow-up in one month.      Relevant Orders   Sedimentation rate   C-reactive protein   CBC   Comprehensive metabolic panel       Cliffton Asters, MD [facility] for Infectious Disease [facility] [phone] pager   [phone] cell 08/14/2015, 3:43 PM

## 2015-08-14 NOTE — Assessment & Plan Note (Signed)
I suspect that his infection has been cured. I will repeat his inflammatory markers and continue observation off of antibiotics. When he was receiving IV vancomycin he developed thrombocytopenia and nephrotoxicity. Those were both improving at the time of his last visit. I will recheck his platelet count and renal function today. He will follow-up in one month.

## 2015-08-15 LAB — SEDIMENTATION RATE: Sed Rate: 5 mm/hr (ref 0–15)

## 2015-08-21 ENCOUNTER — Telehealth: Payer: Self-pay | Admitting: *Deleted

## 2015-08-21 DIAGNOSIS — H9191 Unspecified hearing loss, right ear: Secondary | ICD-10-CM

## 2015-08-21 NOTE — Telephone Encounter (Signed)
Decreased hearing in right ear.  Pt is requesting a referral to an ENT.  MD please advise.

## 2015-08-22 NOTE — Telephone Encounter (Signed)
Please refer to Dr. Narda Bondshris Newman.

## 2015-08-25 NOTE — Telephone Encounter (Signed)
Patient is calling about referral. He has not heard anything from our office.  His lawyer was to be called with information.  I see the referral was made. I am not sure of the detail and will forward to [place], [place].   Laurell Josephsammy K King, RN

## 2015-08-25 NOTE — Telephone Encounter (Signed)
Referral made to Dr. Ezzard StandingNewman.

## 2015-08-25 NOTE — Addendum Note (Signed)
Addended by: Jennet MaduroESTRIDGE, Athziry Millican D on: 08/25/2015 05:32 PM   Modules accepted: Orders

## 2015-08-26 NOTE — Telephone Encounter (Signed)
Pt needing referral faxed to his attorney who will arrange the appt.  Dr. Orvan Falconerampbell signed [facility] to [facility] at the attorney's office (fax# [phone]).  A copy of the signed referral plus the fax confirmation were mailed to the pt at his request.  Pt notified.

## 2015-09-04 ENCOUNTER — Encounter: Payer: Self-pay | Admitting: Internal Medicine

## 2015-09-04 ENCOUNTER — Ambulatory Visit (INDEPENDENT_AMBULATORY_CARE_PROVIDER_SITE_OTHER): Payer: Worker's Compensation | Admitting: Internal Medicine

## 2015-09-04 VITALS — BP 117/80 | HR 72 | Temp 98.6°F | Wt 220.0 lb

## 2015-09-04 DIAGNOSIS — H9191 Unspecified hearing loss, right ear: Secondary | ICD-10-CM | POA: Diagnosis not present

## 2015-09-04 DIAGNOSIS — G003 Staphylococcal meningitis: Secondary | ICD-10-CM

## 2015-09-04 DIAGNOSIS — H9190 Unspecified hearing loss, unspecified ear: Secondary | ICD-10-CM | POA: Insufficient documentation

## 2015-09-04 DIAGNOSIS — N179 Acute kidney failure, unspecified: Secondary | ICD-10-CM

## 2015-09-04 NOTE — Assessment & Plan Note (Signed)
I do not know the cause of his recent hearing loss. It is possible that this could be related to his recent meningitis or due to vancomycin toxicity. He does need a full ENT and audiology evaluation.

## 2015-09-04 NOTE — Progress Notes (Signed)
[facility]  Patient Active Problem List   Diagnosis Date Noted  . Status post lumbar laminectomy 06/10/2015    Priority: High  . Recent history of Staphylococcal meningitis 06/05/2015    Priority: High  . Acute renal failure (ARF) (HCC) 06/24/2015    Priority: Medium  . Hearing loss 09/04/2015  . Bone marrow suppression   . Thrombocytopenia (HCC)   . Pancytopenia (HCC) 06/24/2015  . Headache 06/05/2015  . Morbid obesity (HCC) 11/13/2014  . Bilateral low back pain without sciatica 04/14/2014    Patient's Medications  New Prescriptions   No medications on file  Previous Medications   DOXYLAMINE, SLEEP, (UNISOM) 25 MG TABLET    Take 25 mg by mouth at bedtime as needed. Reported on 08/14/2015   HYDROMORPHONE (DILAUDID) 2 MG TABLET    Take by mouth every 4 (four) hours as needed for severe pain (Take 1-2 every 4 hours as needed).   LACTULOSE (CHRONULAC) 10 GM/15ML SOLUTION    Take 5-10 mLs (3.3333-6.6667 g total) by mouth daily as needed for moderate constipation.  Modified Medications   No medications on file  Discontinued Medications   No medications on file    Subjective: Bruce Morales is in for his routine follow-up visit. 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 He still has some intermittent mild headaches and moderate to severe low back pain but overall is feeling dramatically better. He's had none of this severe headaches he had when he was in the hospital. When he first started antibiotic therapy was on IV vancomycin but developed supratherapeutic levels with acute renal insufficiency. He was switched to IV daptomycin to complete his therapy. After he was discharged from the hospital this last time he began to notice some muffled and decreased hearing in his right ear. He states that he also has noticed some mild ringing in both ears. He states he's  never had any hearing problems previously. He says that he occasionally feels dizzy and very stiff, especially when he first gets up in the morning but does not have any true vertigo. He states that he still feels like his thought process is slower than normal. He states that he and his mother are concerned that he could have permanent cognitive problems as a result of his meningitis. 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 He is still taking 2-4 mg of Dilaudid every 6 hours to control his back pain.  Review of Systems: Review of Systems  Constitutional: Positive for malaise/fatigue. Negative for fever, chills, weight loss and diaphoresis.  HENT: Positive for hearing loss and tinnitus. Negative for congestion, ear discharge, ear pain and sore throat.        As noted in history of present illness.  Respiratory: Negative for cough, sputum production and shortness of breath.   Cardiovascular: Negative for chest pain.  Gastrointestinal: Negative for nausea, vomiting and diarrhea.  Genitourinary: Negative for dysuria.  Musculoskeletal: Positive for back pain. Negative for myalgias, joint pain and neck pain.  Skin: Negative for rash.  Neurological: Positive for dizziness. Negative for focal weakness and headaches.    Past Medical History  Diagnosis Date  . Gall stones   . Chronic lower back pain   . Anginal pain (HCC) 04/15/2014  . Chronic bronchitis (HCC)     "probably get it q yr"  . Arthritis     "  wrists, knees, lower back" (04/16/2014)  . Chronic lower back pain     Social History  Substance Use Topics  . Smoking status: Former Smoker -- 0.50 packs/day for 27 years    Types: Cigarettes    Quit date: 03/11/2015  . Smokeless tobacco: Former Neurosurgeon  . Alcohol Use: 0.0 oz/week    0 Standard drinks or equivalent per week     Comment: 04/16/2014 "a 6 pack will last me a month"    Family History  Problem Relation Age of Onset  . Multiple  sclerosis Mother   . Heart failure Maternal Grandfather   . Diabetes Father   . Fibromyalgia Sister   . Schizophrenia Brother   . Stroke Maternal Grandmother   . Diabetes Paternal Grandmother     Allergies  Allergen Reactions  . Hydrocodone Hives and Itching  . Oxycodone     Mild Itching, patient can tolerate oxycodone  . Percocet [Oxycodone-Acetaminophen]     Itching, patient says he tolerates    Objective: Filed Vitals:   09/04/15 1548  BP: 117/80  Pulse: 72  Temp: 98.6 F (37 C)  TempSrc: Oral  Weight: 220 lb (99.791 kg)   Body mass index is 32.96 kg/(m^2).  Physical Exam  Constitutional: He is oriented to person, place, and time.  He is well dressed and in no distress.  HENT:  Right Ear: External ear normal.  Left Ear: External ear normal.  Mouth/Throat: No oropharyngeal exudate.  He has no problems understanding speech at normal conversational volumes.  Eyes: Conjunctivae are normal.  Neck: Neck supple.  Cardiovascular: Normal rate and regular rhythm.   No murmur heard. Pulmonary/Chest: Breath sounds normal. He has no wheezes. He has no rales.  Abdominal: Soft. There is no tenderness.  Musculoskeletal: Normal range of motion.  Neurological: He is alert and oriented to person, place, and time. Gait normal.  Skin: No rash noted.  Psychiatric: Mood and affect normal.    Lab Results SED RATE (mm/hr)  Date Value  08/14/2015 5  06/11/2015 15  05/16/2015 29*   CRP (mg/dL)  Date Value  09/60/4540 <0.5  06/11/2015 0.6  05/16/2015 5.0*     Problem List Items Addressed This Visit      High   Recent history of Staphylococcal meningitis    I believe that his meningitis has been cured. He continues to slowly improve nearly 2 months after completing antibiotic therapy. He does not require any specific follow-up for this. He can follow-up here as needed.        Medium   Acute renal failure (ARF) (HCC)    He had acute renal insufficiency caused by  vancomycin toxicity but this has now completely resolved and his renal function is back to normal.        Unprioritized   Hearing loss - Primary    I do not know the cause of his recent hearing loss. It is possible that this could be related to his recent meningitis or due to vancomycin toxicity. He does need a full ENT and audiology evaluation.          Cliffton Asters, MD [facility] for Infectious Disease [facility] [phone] pager   [phone] cell 09/04/2015, 4:35 PM

## 2015-09-04 NOTE — Assessment & Plan Note (Signed)
He had acute renal insufficiency caused by vancomycin toxicity but this has now completely resolved and his renal function is back to normal.

## 2015-09-04 NOTE — Assessment & Plan Note (Addendum)
I believe that his meningitis has been cured. He continues to slowly improve nearly 2 months after completing antibiotic therapy. He does not require any specific follow-up for this. He can follow-up here as needed.

## 2015-09-09 DIAGNOSIS — Z0271 Encounter for disability determination: Secondary | ICD-10-CM

## 2015-11-19 ENCOUNTER — Telehealth: Payer: Self-pay

## 2015-11-19 NOTE — Telephone Encounter (Signed)
[facility]ruce [facility] from [facility] verify that the patient paid his bill [phone]

## 2015-11-24 NOTE — Telephone Encounter (Signed)
This is billing--

## 2016-07-22 ENCOUNTER — Emergency Department (HOSPITAL_COMMUNITY)
Admission: EM | Admit: 2016-07-22 | Discharge: 2016-07-22 | Disposition: A | Discharge status: Home / Self Care | Payer: 59 | Attending: Emergency Medicine | Admitting: Emergency Medicine

## 2016-07-22 ENCOUNTER — Encounter (HOSPITAL_COMMUNITY): Payer: Self-pay

## 2016-07-22 ENCOUNTER — Emergency Department (HOSPITAL_COMMUNITY): Payer: 59

## 2016-07-22 DIAGNOSIS — Y9301 Activity, walking, marching and hiking: Secondary | ICD-10-CM | POA: Diagnosis not present

## 2016-07-22 DIAGNOSIS — W268XXA Contact with other sharp object(s), not elsewhere classified, initial encounter: Secondary | ICD-10-CM | POA: Insufficient documentation

## 2016-07-22 DIAGNOSIS — Y929 Unspecified place or not applicable: Secondary | ICD-10-CM | POA: Diagnosis not present

## 2016-07-22 DIAGNOSIS — Z79899 Other long term (current) drug therapy: Secondary | ICD-10-CM | POA: Diagnosis not present

## 2016-07-22 DIAGNOSIS — Z87891 Personal history of nicotine dependence: Secondary | ICD-10-CM | POA: Diagnosis not present

## 2016-07-22 DIAGNOSIS — Y999 Unspecified external cause status: Secondary | ICD-10-CM | POA: Diagnosis not present

## 2016-07-22 DIAGNOSIS — S91332A Puncture wound without foreign body, left foot, initial encounter: Secondary | ICD-10-CM | POA: Insufficient documentation

## 2016-07-22 HISTORY — DX: Meningitis, unspecified: G03.9

## 2016-07-22 MED ORDER — SUMATRIPTAN SUCCINATE 6 MG/0.5ML ~~LOC~~ SOLN: 6.0000 mg | Freq: Once | SUBCUTANEOUS | Status: DC

## 2016-07-22 MED ORDER — CIPROFLOXACIN HCL 500 MG PO TABS: 500.0000 mg | ORAL_TABLET | Freq: Two times a day (BID) | ORAL | 0 refills | Status: DC

## 2016-07-22 MED ORDER — CIPROFLOXACIN HCL 500 MG PO TABS
500.0000 mg | ORAL_TABLET | Freq: Once | ORAL | Status: AC
  Administered 2016-07-22: 500 mg via ORAL
  Filled 2016-07-22: qty 1

## 2016-07-22 NOTE — ED Provider Notes (Signed)
[facility]-EMERGENCY DEPT Provider Note   CSN: [id number] Arrival date & time: 07/22/16  2207     History   Chief Complaint Chief Complaint  Patient presents with  . Foot Pain    stepped on nail.     HPI   Bruce Morales is a 41 y.o. male complaining of puncture wound to the sole of the left foot, he was walking his dog and stepped on nail the nail went through the sole of the shoe and into the foot to remove the nail washed it and presented to the emergency department. He states his last tetanus shot was within the last 5 years. He is not diabetic. He states that the pain has been increasing over the last several hours.  Past Medical History:  Diagnosis Date  . Anginal pain (HCC) 04/15/2014  . Arthritis    "wrists, knees, lower back" (04/16/2014)  . Chronic bronchitis (HCC)    "probably get it q yr"  . Chronic lower back pain   . Chronic lower back pain   . Gall stones   . Meningitis     Patient Active Problem List   Diagnosis Date Noted  . Hearing loss 09/04/2015  . Bone marrow suppression   . Thrombocytopenia (HCC)   . Acute renal failure (ARF) (HCC) 06/24/2015  . Pancytopenia (HCC) 06/24/2015  . Status post lumbar laminectomy 06/10/2015  . Headache 06/05/2015  . 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 Morbid obesity (HCC) 11/13/2014  . Bilateral low back pain without sciatica 04/14/2014    Past Surgical History:  Procedure Laterality Date  . CHOLECYSTECTOMY  2011  . ESOPHAGOGASTRODUODENOSCOPY N/A 02/27/2013   Procedure: ESOPHAGOGASTRODUODENOSCOPY (EGD);  Surgeon: Florencia Reasonsobert V Buccini, MD;  [facility];  Service: Endoscopy;  Laterality: N/A;  . KNEE ARTHROSCOPY Right 2013 X 2  . LEFT HEART CATHETERIZATION WITH CORONARY ANGIOGRAM N/A 04/17/2014   Procedure: LEFT HEART CATHETERIZATION WITH CORONARY ANGIOGRAM;  Surgeon: Ricki RodriguezAjay S Kadakia, MD;  Location: [facility] CATH LAB;  Service: Cardiovascular;  Laterality: N/A;  . RADIOLOGY WITH ANESTHESIA N/A  06/11/2015   Procedure: MRI LUMBAR WITH AND WITHOUT CONTRAST;  Surgeon: Medication Radiologist, MD;  Location: [facility] OR;  Service: Radiology;  Laterality: N/A;       Home Medications    Prior to Admission medications   Medication Sig Start Date End Date Taking? Authorizing Provider  ciprofloxacin (CIPRO) 500 MG tablet Take 1 tablet (500 mg total) by mouth 2 (two) times daily. 07/22/16   Tyland Klemens, PA-C  doxylamine, Sleep, (UNISOM) 25 MG tablet Take 25 mg by mouth at bedtime as needed. Reported on 08/14/2015    Historical Provider, MD  HYDROmorphone (DILAUDID) 2 MG tablet Take by mouth every 4 (four) hours as needed for severe pain (Take 1-2 every 4 hours as needed).    Historical Provider, MD  lactulose (CHRONULAC) 10 GM/15ML solution Take 5-10 mLs (3.3333-6.6667 g total) by mouth daily as needed for moderate constipation. Patient not taking: Reported on 08/14/2015 07/12/15   Lenell Antuhao P Le, DO    Family History Family History  Problem Relation Age of Onset  . Multiple sclerosis Mother   . Heart failure Maternal Grandfather   . Diabetes Father   . Fibromyalgia Sister   . Schizophrenia Brother   . Stroke Maternal Grandmother   . Diabetes Paternal Grandmother     Social History Social History  Substance Use Topics  . Smoking status: Former Smoker    Packs/day: 0.50    Years: 27.00  Types: Cigarettes    Quit date: 03/11/2015  . Smokeless tobacco: Former Neurosurgeon  . Alcohol use 0.0 oz/week     Comment: 04/16/2014 "a 6 pack will last me a month"     Allergies   Hydrocodone; Oxycodone; and Percocet [oxycodone-acetaminophen]   Review of Systems Review of Systems  10 systems reviewed and found to be negative, except as noted in the HPI.  Physical Exam Updated Vital Signs BP 117/74   Pulse 70   Temp 98.4 F (36.9 C) (Oral)   Resp 18   Ht 5\' 9"  (1.753 m)   Wt 99.8 kg   SpO2 98%   BMI 32.49 kg/m   Physical Exam  Constitutional: He is oriented to person, place, and  time. He appears well-developed and well-nourished. No distress.  HENT:  Head: Normocephalic and atraumatic.  Mouth/Throat: Oropharynx is clear and moist.  Eyes: Conjunctivae and EOM are normal. Pupils are equal, round, and reactive to light.  Neck: Normal range of motion.  Cardiovascular: Normal rate, regular rhythm and intact distal pulses.   Pulmonary/Chest: Effort normal and breath sounds normal.  Abdominal: Soft. There is no tenderness.  Musculoskeletal: Normal range of motion.  Neurological: He is alert and oriented to person, place, and time.  Skin: He is not diaphoretic.  Small puncture wound to sole of left foot clean dry and intact.  Psychiatric: He has a normal mood and affect.  Nursing note and vitals reviewed.    ED Treatments / Results  Labs (all labs ordered are listed, but only abnormal results are displayed) Labs Reviewed - No data to display  EKG  EKG Interpretation None       Radiology Dg Foot Complete Left  Result Date: 07/22/2016 CLINICAL DATA:  Stepped on nail in back your, with small puncture wound at the bottom of the left foot, about the distal second metatarsal. Initial encounter. EXAM: LEFT FOOT - COMPLETE 3+ VIEW COMPARISON:  None. FINDINGS: There is no evidence of fracture or dislocation. The joint spaces are preserved. There is no evidence of talar subluxation; the subtalar joint is unremarkable in appearance. The known puncture wound is not well characterized on radiograph. No radiopaque foreign bodies are seen. IMPRESSION: No evidence of fracture or dislocation. No radiopaque foreign bodies seen. Electronically Signed   By: Roanna Raider M.D.   On: 07/22/2016 23:03    Procedures Procedures (including critical care time)  Medications Ordered in ED Medications  ciprofloxacin (CIPRO) tablet 500 mg (500 mg Oral Given 07/22/16 2255)     Initial Impression / Assessment and Plan / ED Course  I have reviewed the triage vital signs and the nursing  notes.  Pertinent labs & imaging results that were available during my care of the patient were reviewed by me and considered in my medical decision making (see chart for details).     Vitals:   07/22/16 2222 07/22/16 2255  BP:  117/74  Pulse:  70  Resp:  18  Temp:  98.4 F (36.9 C)  TempSrc:  Oral  SpO2:  98%  Weight: 99.8 kg   Height: 5\' 9"  (1.753 m)     Medications  ciprofloxacin (CIPRO) tablet 500 mg (500 mg Oral Given 07/22/16 2255)    Bruce Morales is 41 y.o. male presenting with puncture wound to sole of left foot, and nail went through his shoes and into the foot. He is not.diabetic however this patient is extremely prone to infections having multiple complications post surgical. Tetanus is  up-to-date, no foreign bodies on x-ray. Patient is offered crutches however he declines and states that he has some at home. I've advised patient to return to ED for wound recheck in 2 days given his history. Given postop shoe and Cipro prescription, counseled extensively on wound care.  Evaluation does not show pathology that would require ongoing emergent intervention or inpatient treatment. Pt is hemodynamically stable and mentating appropriately. Discussed findings and plan with patient/guardian, who agrees with care plan. All questions answered. Return precautions discussed and outpatient follow up given.      Final Clinical Impressions(s) / ED Diagnoses   Final diagnoses:  Puncture wound to foot, left, initial encounter    New Prescriptions Discharge Medication List as of 07/22/2016 11:31 PM    START taking these medications   Details  ciprofloxacin (CIPRO) 500 MG tablet Take 1 tablet (500 mg total) by mouth 2 (two) times daily., Starting Thu 07/22/2016, [employer] Mackena Plummer, PA-C 07/23/16 1610    Pricilla Loveless, MD 07/23/16 [phone]

## 2016-07-22 NOTE — ED Notes (Signed)
Patient transported to X-ray 

## 2016-07-22 NOTE — ED Triage Notes (Signed)
Stepped on nail about 1000 am today with left food states tetnus shot 08/2012 states now painful no bleeding noted.

## 2016-07-22 NOTE — Discharge Instructions (Signed)
Return to the emergency department in 24-48 hours for a wound recheck.  Wash the affected area with soap and water and apply a thin layer of topical antibiotic ointment. Do this every 12 hours.   Do not use rubbing alcohol or hydrogen peroxide.                        Look for signs of infection: if you see redness, if the area becomes warm, if pain increases sharply, there is discharge (pus), if red streaks appear or you develop fever or vomiting, RETURN immediately to the Emergency Department  for a recheck.

## 2017-05-06 ENCOUNTER — Encounter: Payer: Self-pay | Admitting: Physician Assistant

## 2017-05-06 ENCOUNTER — Ambulatory Visit (INDEPENDENT_AMBULATORY_CARE_PROVIDER_SITE_OTHER): Payer: 59 | Admitting: Physician Assistant

## 2017-05-06 VITALS — BP 110/80 | HR 78 | Temp 98.9°F | Resp 18 | Ht 69.0 in | Wt 244.0 lb

## 2017-05-06 DIAGNOSIS — H6690 Otitis media, unspecified, unspecified ear: Secondary | ICD-10-CM

## 2017-05-06 DIAGNOSIS — R05 Cough: Secondary | ICD-10-CM | POA: Diagnosis not present

## 2017-05-06 DIAGNOSIS — R079 Chest pain, unspecified: Secondary | ICD-10-CM | POA: Diagnosis not present

## 2017-05-06 DIAGNOSIS — R059 Cough, unspecified: Secondary | ICD-10-CM

## 2017-05-06 MED ORDER — AMOXICILLIN 500 MG PO CAPS: 500.0000 mg | ORAL_CAPSULE | Freq: Three times a day (TID) | ORAL | 0 refills | Status: AC

## 2017-05-06 MED ORDER — BENZONATATE 100 MG PO CAPS: 100.0000 mg | ORAL_CAPSULE | Freq: Three times a day (TID) | ORAL | 0 refills | Status: DC | PRN

## 2017-05-06 NOTE — Patient Instructions (Addendum)
Physical exam findings are consistent with otitis media.  You are likely also suffering from a upper respiratory infection.  For the ear infection, I have given you an oral antibiotic to take as prescribed. For the espiratory viral infection:  - I recommend you rest, drink plenty of fluids, eat light meals including soups.  - You may use nyquil cough syrup at night for your cough and sore throat, Tessalon pearls during the day.  You may use over-the-counter Flonase or an oral antihistamine for your nasal congestion. - You may also use Tylenol or ibuprofen over-the-counter for your sore throat.  - Please let me know if you are not seeing any improvement or get worse in 5-7 days..   Cough, Adult A cough helps to clear your throat and lungs. A cough may last only 2-3 weeks (acute), or it may last longer than 8 weeks (chronic). Many different things can cause a cough. A cough may be a sign of an illness or another medical condition. Follow these instructions at home:  Pay attention to any changes in your cough.  Take medicines only as told by your doctor. ? If you were prescribed an antibiotic medicine, take it as told by your doctor. Do not stop taking it even if you start to feel better. ? Talk with your doctor before you try using a cough medicine.  Drink enough fluid to keep your pee (urine) clear or pale yellow.  If the air is dry, use a cold steam vaporizer or humidifier in your home.  Stay away from things that make you cough at work or at home.  If your cough is worse at night, try using extra pillows to raise your head up higher while you sleep.  Do not smoke, and try not to be around smoke. If you need help quitting, ask your doctor.  Do not have caffeine.  Do not drink alcohol.  Rest as needed. Contact a doctor if:  You have new problems (symptoms).  You cough up yellow fluid (pus).  Your cough does not get better after 2-3 weeks, or your cough gets worse.  Medicine  does not help your cough and you are not sleeping well.  You have pain that gets worse or pain that is not helped with medicine.  You have a fever.  You are losing weight and you do not know why.  You have night sweats. Get help right away if:  You cough up blood.  You have trouble breathing.  Your heartbeat is very fast. This information is not intended to replace advice given to you by your health care provider. Make sure you discuss any questions you have with your health care provider. Document Released: 01/28/2011 Document Revised: 10/23/2015 Document Reviewed: 07/24/2014 Elsevier Interactive Patient Education  2018 [employer]     IF you received an x-ray today, you will receive an invoice from [facility]. Please contact [facility] at [phone] with questions or concerns regarding your invoice.   IF you received labwork today, you will receive an invoice from [facility]. Please contact [facility] at [phone] with questions or concerns regarding your invoice.   Our billing staff will not be able to assist you with questions regarding bills from these companies.  You will be contacted with the lab results as soon as they are available. The fastest way to get your results is to activate your My Chart account. Instructions are located on the last page of this paperwork. If you have not heard  from [place] regarding the results in 2 weeks, please contact this office.

## 2017-05-06 NOTE — Progress Notes (Signed)
MRN: [id number] DOB: 02-02-1976  Subjective:   Bruce Morales is a 41 y.o. male presenting for chief complaint of Cough (x1 week or so) .  Reports 4 day history of sinus congestion, rhinorrhea, right ear pain and productive cough (no hemoptysis) . Has tried soup with no relief. Denies fever, wheezing, shortness of breath and myalgia, chills, fatigue, nausea, vomiting, abdominal pain and diarrhea. Has had sick contact with wife and child. No history of seasonal allergies, history of asthma. Patient has had flu shot this season. Former smoker  Pt also had moment of chest pain the other day at the grocery store. Has hx of episodes of sharp chest pain x years. It can occur at anytime. Cannot tell if he has any nausea, vomiting, SOB, and diaphoreisis when it occurs.Thinks it may be triggered by coffee.. Not currently symptomatic. Former smoker, quit 3 years ago. 27 ppd. Pt does not exercise. FH of heart disease in MGF. Nexium used to help. Has been evaluated by cardiology and GI for this in the past.  Her evaluation in 2015, his EKG showed sinus rhythm with first-degree AV block.  Cardiac cath with with apical hypokinesia with mild left ventricular systolic dysfunction.  Ejection fraction of 45%.  He was told to follow-up with cardiology after his heart cath but never did.  Bruce Morales has a current medication list which includes the following prescription(s): hydromorphone. Also is allergic to hydrocodone; oxycodone; and percocet [oxycodone-acetaminophen].  Bruce Morales  has a past medical history of Anginal pain (HCC) (04/15/2014), Arthritis, Chronic bronchitis (HCC), Chronic lower back pain, Chronic lower back pain, Gall stones, and Meningitis. Also  has a past surgical history that includes Cholecystectomy (2011); Knee arthroscopy (Right, 2013 X 2); Esophagogastroduodenoscopy (N/A, 02/27/2013); left heart catheterization with coronary angiogram (N/A, 04/17/2014); and Radiology with anesthesia (N/A,  06/11/2015).   Objective:   Vitals: BP 110/80   Pulse 78   Temp 98.9 F (37.2 C) (Oral)   Resp 18   Ht 5\' 9"  (1.753 m)   Wt 244 lb (110.7 kg)   SpO2 98%   BMI 36.03 kg/m   Physical Exam  Constitutional: He is oriented to person, place, and time. He appears well-developed and well-nourished. No distress.  HENT:  Head: Normocephalic and atraumatic.  Right Ear: External ear and ear canal normal. There is drainage (mild amount purulent drainage noted behind TM). No mastoid tenderness. Tympanic membrane is erythematous and bulging.  Left Ear: Tympanic membrane, external ear and ear canal normal.  Nose: Mucosal edema present. Right sinus exhibits no maxillary sinus tenderness and no frontal sinus tenderness. Left sinus exhibits no maxillary sinus tenderness and no frontal sinus tenderness.  Mouth/Throat: Uvula is midline, oropharynx is clear and moist and mucous membranes are normal. No tonsillar exudate.  Eyes: Conjunctivae are normal.  Neck: Normal range of motion.  Cardiovascular: Normal rate, regular rhythm and normal heart sounds.  Pulmonary/Chest: Effort normal and breath sounds normal. He has no wheezes. He has no rales.  Lymphadenopathy:       Head (right side): No submental, no submandibular, no tonsillar, no preauricular, no posterior auricular and no occipital adenopathy present.       Head (left side): No submental, no submandibular, no tonsillar, no preauricular, no posterior auricular and no occipital adenopathy present.    He has no cervical adenopathy.       Right axillary: No pectoral adenopathy present.       Right: No supraclavicular adenopathy present.  Left: No supraclavicular adenopathy present.  Neurological: He is alert and oriented to person, place, and time.  Skin: Skin is warm and dry.  Psychiatric: He has a normal mood and affect.  Vitals reviewed.   No results found for this or any previous visit (from the past 24 hour(s)).  EKG shows sinus rhythm  with rate of 73bpm. First degree AV block noted. PR interval 224msec. QRS 98msec.. No acute ST or T wave abnormality.  Findings presented and discussed with Dr. Leretha PolSantiago.   Assessment and Plan :  This case was precepted with Dr. Leretha PolSantiago. 1. Chest pain, unspecified type Patient has long-standing history of intermittent chest pain.  EKG today consistent with prior EKGs showing first-degree AV block.  No acute findings on EKG. He is asymptomatic.  Per review of patient's chart, it was recommended that he continue follow-up with cardiology, however patient did not do so.  Will place referral today.  Given strict ED precautions. - EKG 12-Lead - Ambulatory referral to Cardiology  2. Cough - benzonatate (TESSALON) 100 MG capsule; Take 1-2 capsules (100-200 mg total) by mouth 3 (three) times daily as needed for cough.  Dispense: 40 capsule; Refill: 0  3. Acute otitis media, unspecified otitis media type Physical exam findings consistent with acute otitis media.  Will treat with antibiotics at this time. - amoxicillin (AMOXIL) 500 MG capsule; Take 1 capsule (500 mg total) by mouth 3 (three) times daily for 7 days.  Dispense: 21 capsule; Refill: 0  Benjiman CoreBrittany Wiseman, PA-C  Primary Care at [facility] 05/06/2017 4:04 PM

## 2017-10-02 IMAGING — MR MR LUMBAR SPINE WO/W CM
4 of 7 series · 18 of 48 positions shown · IV contrast (20    MULTI)
Comparison: MRIs dated 05/21/2015 and 02/13/2015

CLINICAL DATA: Progressive low back pain and bilateral hip pain and
right leg pain since right hemilaminectomy at L5-S1 on 04/17/2015.
Now has bacterial meningitis.

EXAM:
MRI LUMBAR SPINE WITHOUT AND WITH CONTRAST
TECHNIQUE: Multiplanar and multiecho pulse sequences of the lumbar spine were
obtained without and with intravenous contrast.
CONTRAST:  20mL MULTIHANCE GADOBENATE DIMEGLUMINE 529 MG/ML IV SOLN

[Series 3: T2 · sagittal · 4.0mm · 0.55mm/px · 3 of 12 slices shown (1 of 2)]
[im 1/12]
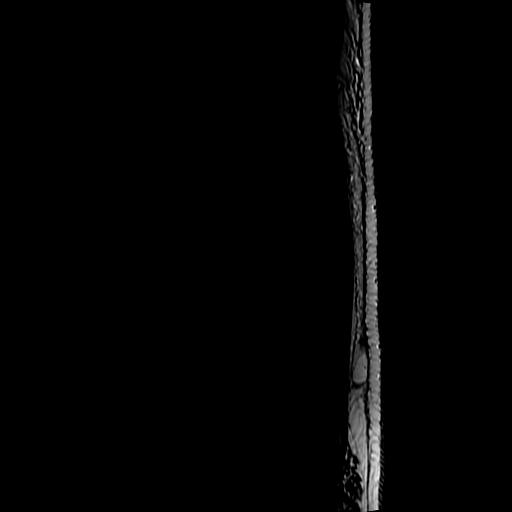
[im 6/12]
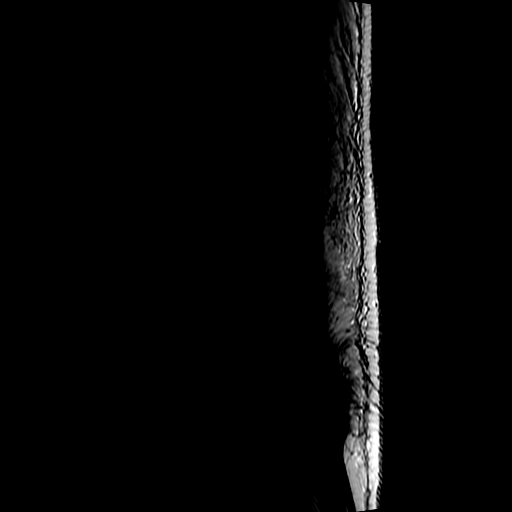
[im 12/12]
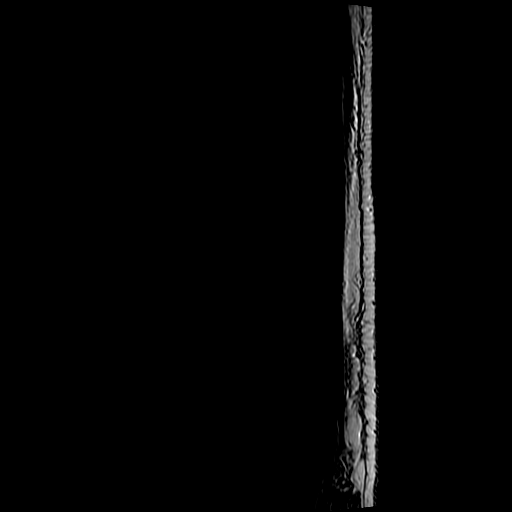

[Series 6: T2 · axial · 4.0mm · 0.39mm/px · z∈[-27,+193]mm · 9 of 45 slices shown (2 of 2)]
[im 1/45]
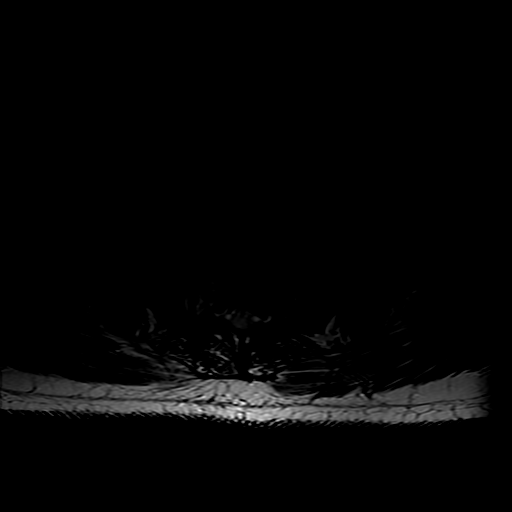
[im 9/45]
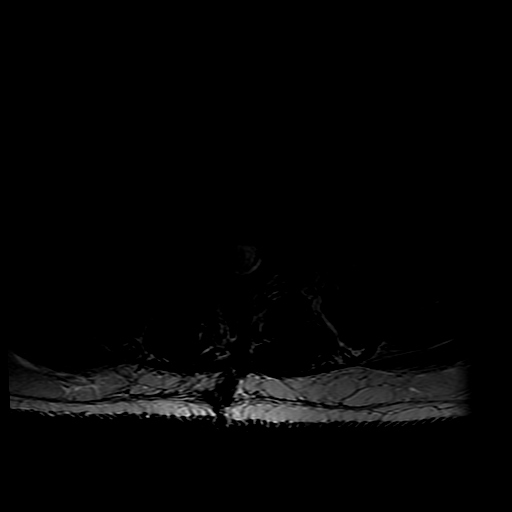
[im 13/45]
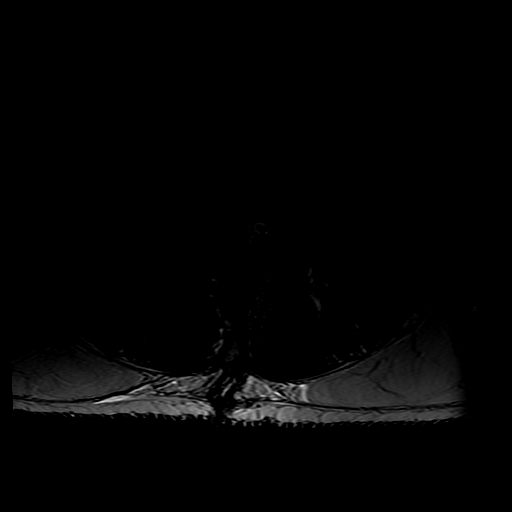
[im 21/45]
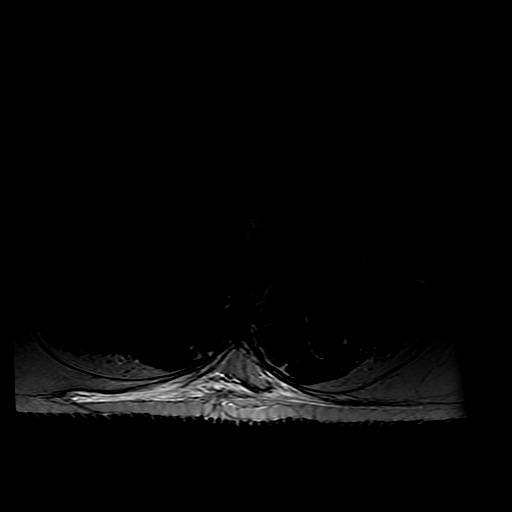
[im 25/45]
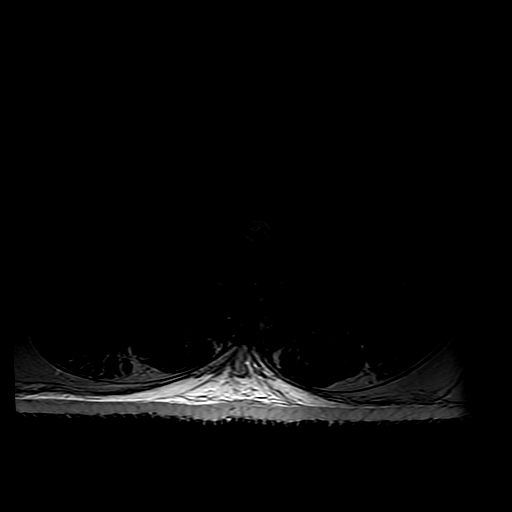
[im 33/45]
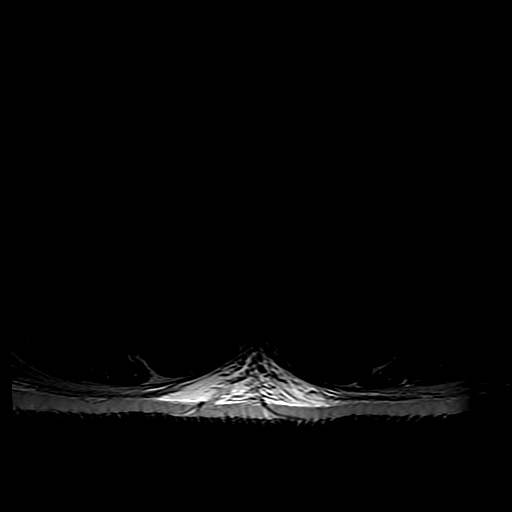
[im 37/45]
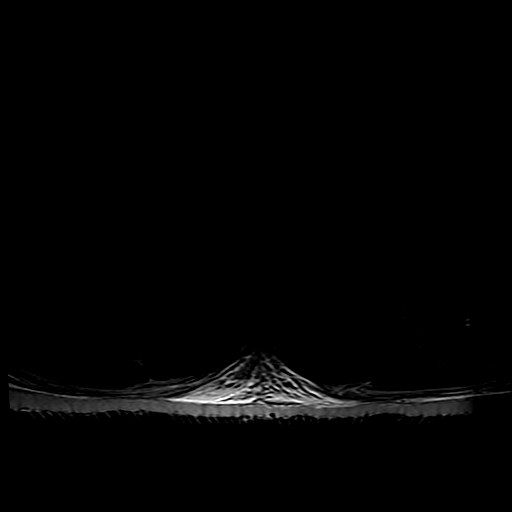
[im 41/45]
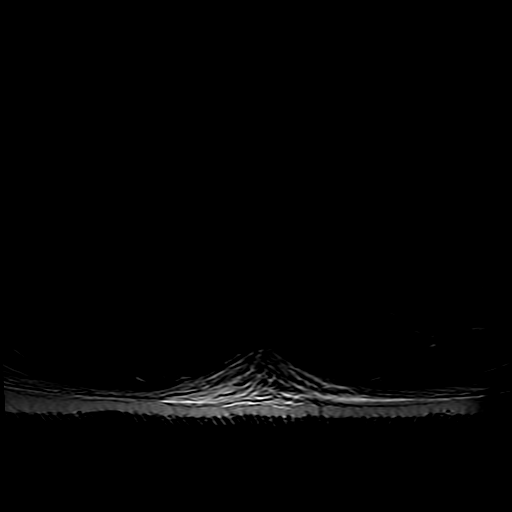
[im 45/45]
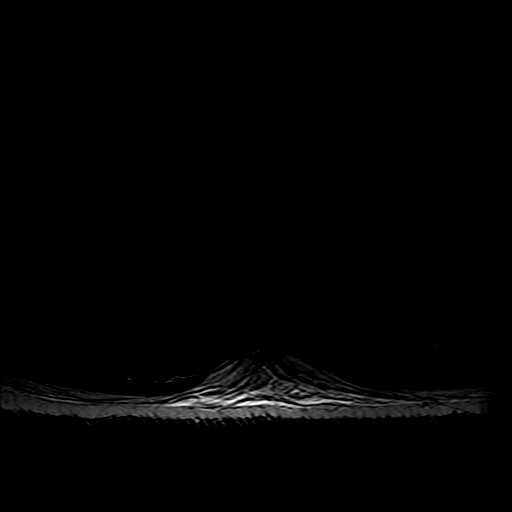

[Series 7: T1 · axial · 4.0mm · 0.39mm/px · z∈[+13,+173]mm · 3 of 45 slices shown (1 of 2)]
[im 9/45]
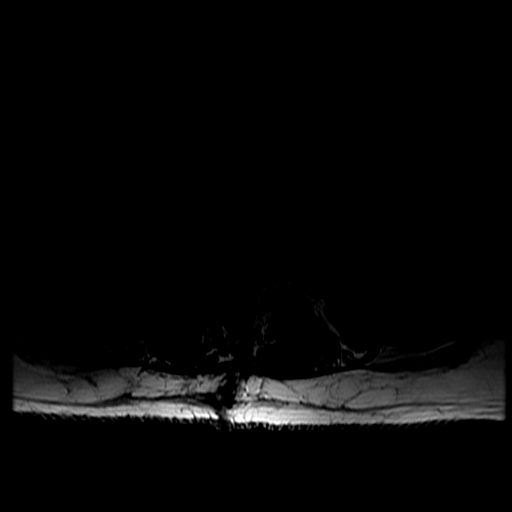
[im 25/45]
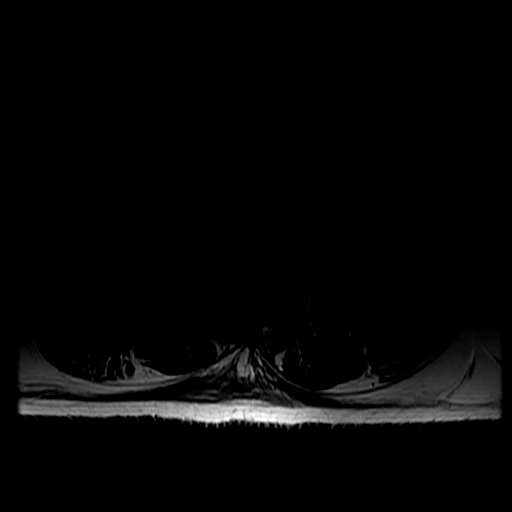
[im 41/45]
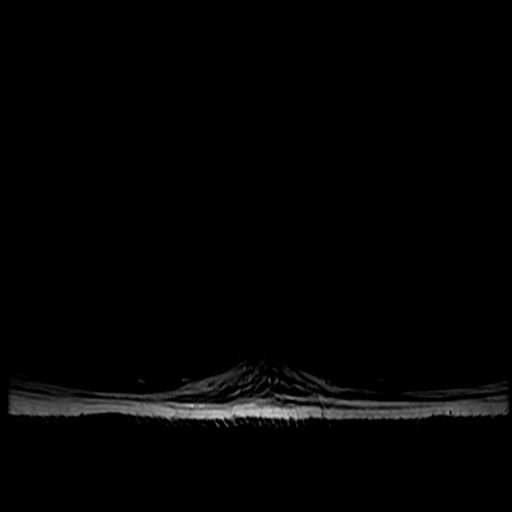

[Series 8: T1 · sagittal · 4.0mm · 0.55mm/px · 3 of 12 slices shown (2 of 2)]
[im 1/12]
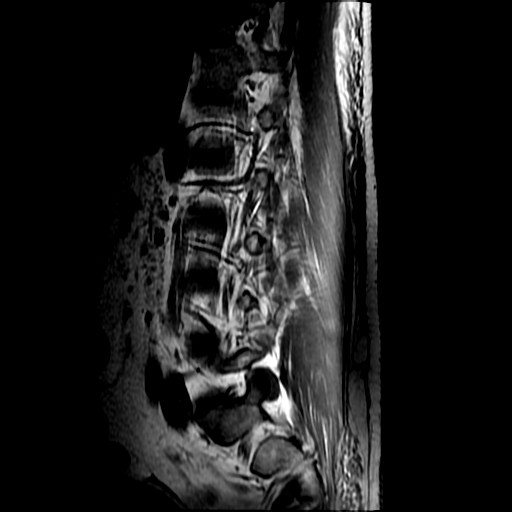
[im 6/12]
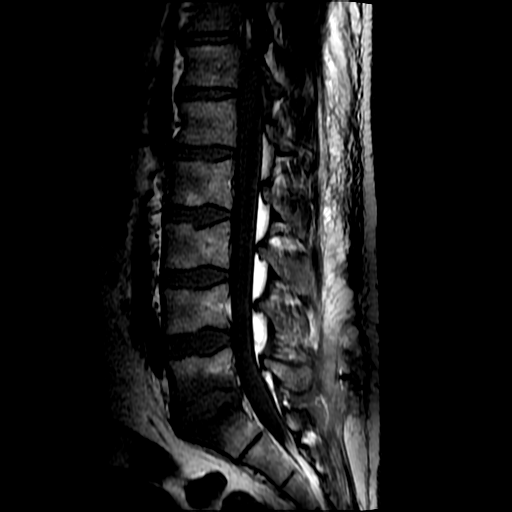
[im 12/12]
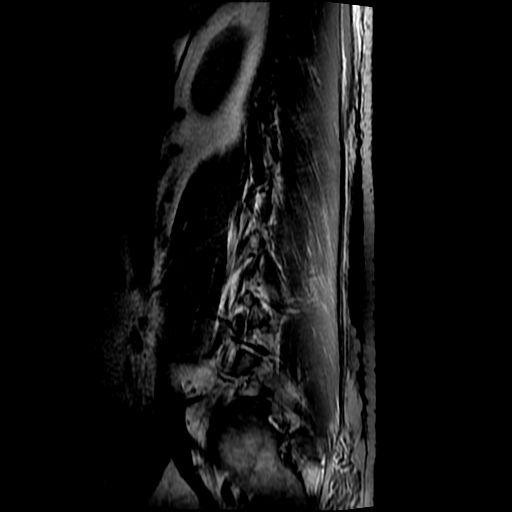

[18 of 48 positions shown; findings below may reference images not displayed]

FINDINGS: L5-S1: The patient has developed a subdural fluid collection along
the posterior aspect of the thecal sac is deviates the nerve roots
anteriorly within the thecal sac from the level of the tip of the
conus through the S1 segment. This is not felt to be significant and
is possibly iatrogenic from the recent lumbar puncture.

There is a tiny defect in the thecal sac at the site of the
laminectomy visible only on image 35 of series 11 with a tiny amount
of fluid extending into the laminectomy defect. There is no
pseudomeningocele. No fluid collections in the soft tissues
posterior to that site.

There is meningeal enhancement and dural enhancement extending along
the posterior aspect of the thecal sac throughout the lumbar spine.

The remainder of the lumbar spine appears normal.

There is no appreciable osteomyelitis or discitis. Normal
postsurgical changes at L5-S1 with less edema and enhancement in the
vertebral endplates than on the prior study of 05/21/2015.
IMPRESSION: 1. Small defect in the thecal sac at L5-S1 on the right without a
pseudomeningocele.
2. Enhancement of the meninges and dura posteriorly consistent with
the clinical diagnosis of meningitis.
3. Subdural fluid collection deviates the nerve roots in the thecal
sac anteriorly as described above.
4. No evidence of discitis or osteomyelitis or abscess.
Critical Value/emergent results were called by telephone at the time
of interpretation on 06/11/2015 at [DATE] to Dr. Michayla Choi, who
verbally acknowledged these results.

## 2017-10-20 ENCOUNTER — Other Ambulatory Visit: Payer: Self-pay | Admitting: Anesthesiology

## 2017-10-20 ENCOUNTER — Ambulatory Visit (INDEPENDENT_AMBULATORY_CARE_PROVIDER_SITE_OTHER): Payer: 59 | Admitting: Family Medicine

## 2017-10-20 ENCOUNTER — Encounter: Payer: Self-pay | Admitting: Family Medicine

## 2017-10-20 VITALS — BP 118/80 | HR 83 | Temp 97.5°F | Ht 67.32 in | Wt 246.2 lb

## 2017-10-20 DIAGNOSIS — Z8661 Personal history of infections of the central nervous system: Secondary | ICD-10-CM | POA: Diagnosis not present

## 2017-10-20 DIAGNOSIS — M25561 Pain in right knee: Secondary | ICD-10-CM | POA: Diagnosis not present

## 2017-10-20 DIAGNOSIS — Z6838 Body mass index (BMI) 38.0-38.9, adult: Secondary | ICD-10-CM

## 2017-10-20 DIAGNOSIS — G894 Chronic pain syndrome: Secondary | ICD-10-CM | POA: Diagnosis not present

## 2017-10-20 DIAGNOSIS — M5417 Radiculopathy, lumbosacral region: Secondary | ICD-10-CM

## 2017-10-20 NOTE — Patient Instructions (Addendum)
  Thanks for coming in today. We can schedule a physical in the next month to review some health maintenance and labwork at that time.   Keep follow up with Dr. Ollen Bowl and plan for MRI for your back as radiating pain in leg may be from your back. I would also continue to discuss your arm symptoms with Dr. Ollen Bowl.   If continued issues with your knee, follow up with Dr. Thomasena Edis to decide on next step.   Low intensity exercise (as tolerated with your areas of pain and cleared by your pain specialist) most days per week, continue to avoid fast food and sugar containing beverages. Also try to avoid late night snacks. Breakfast each day. Try a smaller plate at home to see if that will help with portion control. Goal of BMI less than 30. I would like to see how weight loss is going in next 3 months.   Please follow up to discuss foot pain further. It may be helpful to see if MRI may be helpfult o look at that cause.   IF you received an x-ray today, you will receive an invoice from [facility]. Please contact [facility] at [phone] with questions or concerns regarding your invoice.   IF you received labwork today, you will receive an invoice from [place]. Please contact [facility] at [phone] with questions or concerns regarding your invoice.   Our billing staff will not be able to assist you with questions regarding bills from these companies.  You will be contacted with the lab results as soon as they are available. The fastest way to get your results is to activate your My Chart account. Instructions are located on the last page of this paperwork. If you have not heard from [place] regarding the results in 2 weeks, please contact this office.

## 2017-10-20 NOTE — Progress Notes (Signed)
Subjective:  By signing my name below, I, Bruce Morales, attest that this documentation has been prepared under the direction and in the presence of Shade Flood, MD Electronically Signed: Charline Bills, ED Scribe 10/20/2017 at 11:04 AM.   Patient ID: Bruce Morales, male    DOB: June 04, 1975, 42 y.o.   MRN: [id number]  Chief Complaint  Patient presents with  . Establish Care    wants to give provider an up date on his 3 Rt knee surgeries (torn meniscus) and back. has a pain management provider.     HPI  Bruce Morales is a 42 y.o. male who presents to Primary Care at [facility] to establish care. New pt to me. Prev PCP Dr. Merla Riches and Dr. Milus Glazier. H/o obesity, low back pain, staph meningitis in 07/17/15 treated with 6 wks IV antibiotics for pos-op MRSA. OV noted 09/04/15 by ID, meningitis appeared to have been cured. Did note hearing loss with plan for eval by ENT. Also here to discuss prev knee issues. R knee arthroscopy 1/3 by Dr. Thomasena Edis with [facility].  Back Pain Noted at 09/04/15 visit with ID, moderate to severe low back pain at that time. MRI of lumbar spine is scheduled in ~1 wk, ordered by Dr. Odette Fraction with [facility], followed for pain management. - Pt states chronic back pain radiates into his R leg and now into R foot into his heel and occasionally has numbness into R arm x 1 month. He does see a masseuse which he states provides the most relief.  R Knee Pain Pt reports that he is still having R knee pain. He has had an injection and taken Prednisone with ~1 wk of relief. He is planning on viscosupplementation but currently awaiting approval.  Weight Management Pt states Dr. Ollen Bowl suggested yoga but pt reports that he is unable to try yoga due to finances. He does have a gym membership but states he is unable to workout due to severity of pain. States he has tried keto diet but was unable to drop weight. Reports that he primarily cooks at home  and only eats out about twice a month. However, he does report having large portions. Also states that he has cut back on soda, mainly drinks water but does not eat breakfast, only coffee. He does report that he occassionally makes a bowel of ice cream or cereal in the middle of the night when he can't sleep which is also contributing to pain.  Wt Readings from Last 3 Encounters:  10/20/17 246 lb 3.2 oz (111.7 kg)  05/06/17 244 lb (110.7 kg)  07/22/16 220 lb (99.8 kg)  Body mass index is 38.19 kg/m.  Patient Active Problem List   Diagnosis Date Noted  . Hearing loss 09/04/2015  . Bone marrow suppression   . Thrombocytopenia (HCC)   . Acute renal failure (ARF) (HCC) 06/24/2015  . Pancytopenia (HCC) 06/24/2015  . Status post lumbar laminectomy 06/10/2015  . Headache 06/05/2015  . 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 Morbid obesity (HCC) 11/13/2014  . Bilateral low back pain without sciatica 04/14/2014   Past Medical History:  Diagnosis Date  . Anginal pain (HCC) 04/15/2014  . Arthritis    "wrists, knees, lower back" (04/16/2014)  . Chronic bronchitis (HCC)    "probably get it q yr"  . Chronic lower back pain   . Chronic lower back pain   . Gall stones   . Meningitis    Past Surgical  History:  Procedure Laterality Date  . CHOLECYSTECTOMY  2011  . ESOPHAGOGASTRODUODENOSCOPY N/A 02/27/2013   Procedure: ESOPHAGOGASTRODUODENOSCOPY (EGD);  Surgeon: Florencia Reasons, MD;  Location: [facility];  Service: Endoscopy;  Laterality: N/A;  . KNEE ARTHROSCOPY Right 2013 X 2  . LEFT HEART CATHETERIZATION WITH CORONARY ANGIOGRAM N/A 04/17/2014   Procedure: LEFT HEART CATHETERIZATION WITH CORONARY ANGIOGRAM;  Surgeon: Ricki Rodriguez, MD;  Location: [facility]ATH [facility];  Service: Cardiovascular;  Laterality: N/A;  . RADIOLOGY WITH ANESTHESIA N/A 06/11/2015   Procedure: MRI LUMBAR WITH AND WITHOUT CONTRAST;  Surgeon: Medication Radiologist, MD;  Location: [facility] OR;  Service: Radiology;   Laterality: N/A;   Allergies  Allergen Reactions  . Hydrocodone Hives and Itching  . Oxycodone     Mild Itching, patient can tolerate oxycodone  . Percocet [Oxycodone-Acetaminophen]     Itching, patient says he tolerates   Prior to Admission medications   Medication Sig Start Date End Date Taking? Authorizing Provider  benzonatate (TESSALON) 100 MG capsule Take 1-2 capsules (100-200 mg total) by mouth 3 (three) times daily as needed for cough. 05/06/17   Benjiman Core D, PA-C  HYDROmorphone (DILAUDID) 2 MG tablet Take by mouth every 4 (four) hours as needed for severe pain (Take 1-2 every 4 hours as needed).    [provider]   Social History   Socioeconomic History  . Marital status: Married    Spouse name: Not on file  . Number of children: Not on file  . Years of education: Not on file  . Highest education level: Not on file  Occupational History  . Not on file  Social Needs  . Financial resource strain: Not on file  . Food insecurity:    Worry: Not on file    Inability: Not on file  . Transportation needs:    Medical: Not on file    Non-medical: Not on file  Tobacco Use  . Smoking status: Former Smoker    Packs/day: 0.50    Years: 27.00    Pack years: 13.50    Types: Cigarettes    Last attempt to quit: 03/11/2015    Years since quitting: 2.6  . Smokeless tobacco: Former Engineer, water and Sexual Activity  . Alcohol use: Yes    Alcohol/week: 0.0 oz    Comment: 04/16/2014 "a 6 pack will last me a month"  . Drug use: No    Comment: 04/16/2014 "in my teens"  . Sexual activity: Yes  Lifestyle  . Physical activity:    Days per week: Not on file    Minutes per session: Not on file  . Stress: Not on file  Relationships  . Social connections:    Talks on phone: Not on file    Gets together: Not on file    Attends religious service: Not on file    Active member of club or organization: Not on file    Attends meetings of clubs or organizations: Not  on file    Relationship status: Not on file  . Intimate partner violence:    Fear of current or ex partner: Not on file    Emotionally abused: Not on file    Physically abused: Not on file    Forced sexual activity: Not on file  Other Topics Concern  . Not on file  Social History Narrative   Drinks 2 cups of coffee in the morning.   Review of Systems  Musculoskeletal: Positive for arthralgias, back pain and myalgias.  Objective:   Physical Exam  Constitutional: He is oriented to person, place, and time. He appears well-developed and well-nourished. No distress.  HENT:  Head: Normocephalic and atraumatic.  Eyes: Conjunctivae and EOM are normal.  Neck: Neck supple. No tracheal deviation present.  Cardiovascular: Normal rate and regular rhythm.  Pulmonary/Chest: Effort normal and breath sounds normal. No respiratory distress.  Musculoskeletal: Normal range of motion.  Neurological: He is alert and oriented to person, place, and time.  Skin: Skin is warm and dry.  Psychiatric: He has a normal mood and affect. His behavior is normal.  Nursing note and vitals reviewed.  Vitals:   10/20/17 1036  BP: 118/80  Pulse: 83  Temp: (!) 97.5 F (36.4 C)  TempSrc: Oral  SpO2: 97%  Weight: 246 lb 3.2 oz (111.7 kg)  Height: 5' 7.32" (1.71 m)      Assessment & Plan:  Bruce Morales is a 42 y.o. male Chronic pain syndrome  -Follow-up with pain management provider.  Has upcoming MRI planned  History of meningitis  -Previously treated.  MRI pending for back symptoms.  RTC precautions if acute worsening  Right knee pain, unspecified chronicity  -Followed by orthopedist, Dr. Thomasena Edis.  Advised to follow-up if persistent issues with knee, but some radiating pain may be coming from back.  BMI 38  weight loss discussed with initial BMI less than 30 as a temporary goal, but ultimately BMI less than 35.  Diet approaches with exercise discussed.  Recheck BMI in 3 months time.  Plan for  physical to review other health maintenance items and fasting lab work in the next few months.  No orders of the defined types were placed in this encounter.  Patient Instructions    Thanks for coming in today. We can schedule a physical in the next month to review some health maintenance and labwork at that time.   Keep follow up with Dr. Ollen Bowl and plan for MRI for your back as radiating pain in leg may be from your back. I would also continue to discuss your arm symptoms with Dr. Ollen Bowl.   If continued issues with your knee, follow up with Dr. Thomasena Edis to decide on next step.   Low intensity exercise (as tolerated with your areas of pain and cleared by your pain specialist) most days per week, continue to avoid fast food and sugar containing beverages. Also try to avoid late night snacks. Breakfast each day. Try a smaller plate at home to see if that will help with portion control. Goal of BMI less than 30. I would like to see how weight loss is going in next 3 months.   Please follow up to discuss foot pain further. It may be helpful to see if MRI may be helpfult o look at that cause.   IF you received an x-ray today, you will receive an invoice from [facility]. Please contact [facility] at [phone] with questions or concerns regarding your invoice.   IF you received labwork today, you will receive an invoice from [place]. Please contact [facility] at [phone] with questions or concerns regarding your invoice.   Our billing staff will not be able to assist you with questions regarding bills from these companies.  You will be contacted with the lab results as soon as they are available. The fastest way to get your results is to activate your My Chart account. Instructions are located on the last page of this paperwork. If you have not heard from [place]  regarding the results in 2 weeks, please contact this office.       I personally performed the services  described in this documentation, which was scribed in my presence. The recorded information has been reviewed and considered for accuracy and completeness, addended by me as needed, and agree with information above.  Signed,   Meredith Staggers, MD Primary Care at [facility].  10/21/17 1:31 PM

## 2017-10-21 ENCOUNTER — Encounter: Payer: Self-pay | Admitting: Family Medicine

## 2017-10-26 ENCOUNTER — Ambulatory Visit: Payer: 59 | Admitting: Physician Assistant

## 2017-10-28 ENCOUNTER — Other Ambulatory Visit: Payer: Self-pay

## 2017-10-31 ENCOUNTER — Ambulatory Visit
Admission: RE | Admit: 2017-10-31 | Discharge: 2017-10-31 | Disposition: A | Discharge status: Home / Self Care | Payer: Self-pay | Source: Ambulatory Visit | Attending: Anesthesiology | Admitting: Anesthesiology

## 2017-10-31 DIAGNOSIS — M5417 Radiculopathy, lumbosacral region: Secondary | ICD-10-CM

## 2017-11-08 ENCOUNTER — Encounter: Payer: 59 | Admitting: Family Medicine

## 2017-11-15 ENCOUNTER — Encounter: Payer: Self-pay | Admitting: Physician Assistant

## 2017-11-15 ENCOUNTER — Telehealth: Payer: Self-pay | Admitting: Physician Assistant

## 2017-11-15 ENCOUNTER — Ambulatory Visit (INDEPENDENT_AMBULATORY_CARE_PROVIDER_SITE_OTHER): Payer: 59 | Admitting: Physician Assistant

## 2017-11-15 ENCOUNTER — Other Ambulatory Visit: Payer: Self-pay

## 2017-11-15 VITALS — BP 118/80 | HR 63 | Temp 98.0°F | Resp 16 | Ht 69.29 in | Wt 245.0 lb

## 2017-11-15 DIAGNOSIS — K594 Anal spasm: Secondary | ICD-10-CM | POA: Diagnosis not present

## 2017-11-15 DIAGNOSIS — K921 Melena: Secondary | ICD-10-CM

## 2017-11-15 DIAGNOSIS — K602 Anal fissure, unspecified: Secondary | ICD-10-CM

## 2017-11-15 MED ORDER — DILTIAZEM HCL 30 MG PO TABS: ORAL_TABLET | ORAL | 0 refills | Status: DC

## 2017-11-15 MED ORDER — DILTIAZEM GEL 2 %: CUTANEOUS | 0 refills | Status: DC

## 2017-11-15 MED ORDER — LIDOCAINE (ANORECTAL) 5 % EX GEL: CUTANEOUS | 0 refills | Status: DC

## 2017-11-15 NOTE — Telephone Encounter (Signed)
Copied from CRM [id number]. Topic: Quick Communication - Rx Refill/Question >> Nov 15, 2017 12:13 PM Burchel, Abbi R wrote: Medication: diltiazem (CARDIZEM) 30 MG tablet  Heather from [facility] [address] called to clarify instructions for usage of this Rx.   Contact Phone: [phone]

## 2017-11-15 NOTE — Telephone Encounter (Signed)
I spoke to EsmondHeather and per [place] he is to take no more than one a week as needed for rectal spasms. Heather verbalized understanding

## 2017-11-15 NOTE — Patient Instructions (Addendum)
You have an anal fissure at the 12 o clock position of your anus.  I have given you prescription for 2 chills to apply externally 3 times a day to help with the healing process.  Take a pea-sized amount of each do not mix together then applied to the affected area.  Avoid sitting on the toilet for longer than 5 minutes without having a bowel movement.  I recommend starting a daily stool softener such as docusate sodium.  You can get this over-the-counter.  Also recommend doing a high-fiber smoothie every few days.  You can put half of the pineapple core along with the chopped of apple and a cup is managed with some water.  Drink at least 64 ounces of water a day.  For rectal spasms, I have refilled her diltiazem.  Be aware that this can lower your heart rate and your blood pressure.  You may want to try breaking the tablet in half and seeing if this helps with your symptoms and not use a whole tablet due to how low your blood pressure and heart rate typically run.  Do not use if your heart rate is below 60 or your blood pressure is below 100/60.  If your blood pressure goes below 90/60 or your pulse lower than 50, please seek care immediately.   IF you received an x-ray today, you will receive an invoice from [facility]. Please contact [facility] at [phone] with questions or concerns regarding your invoice.   IF you received labwork today, you will receive an invoice from [facility]. Please contact [facility] at [phone] with questions or concerns regarding your invoice.   Our billing staff will not be able to assist you with questions regarding bills from these companies.  You will be contacted with the lab results as soon as they are available. The fastest way to get your results is to activate your My Chart account. Instructions are located on the last page of this paperwork. If you have not heard from us regarding the results in 2 weeks, please contact this office.

## 2017-11-15 NOTE — Progress Notes (Signed)
Bruce Morales  MRN: [id number] DOB: 18-May-1976  Subjective:  Bruce Morales is a 43 y.o. male seen in office today for a chief complaint of intermittent bright red bloody stools x 1.5 weeks. Only sees it on toilet paper and on top of stool. Last saw it 3 days ago.  Has has had "gut problems" over the past week. Had some diarrhea (few times per day), constipation, and some rectal discomfort during BM. Denies rectal burning, abdominal pain, nausea,vomiting, odynophagia, unintentional weight loss, mucopurulent stools, and melana. Denies anal trauma. Takes daily dilaudid QID for chronic pain syndrome. Will occasionally take miralax.  Typically tries to go twice daily but will sit on the toilet for a long time. Trying to eat more grains. Only drinks 3-4 bottles a day. Denies recent travel.  Has PMH of hemorrhoids and proctalgia fugax. Has never had a colonoscopy.  Last saw GI at [facility] and 2016.  Uses diltiazem 30 mg tablet for rectal spasms prn and has significant relief with medication.Typically has 1-2 spasms per month.   Has been out of med for a few months.  Would like a refill.  No FH of colon cancer, crohns, or UC.    Review of Systems  Constitutional: Negative for chills, diaphoresis, fatigue and fever.  Respiratory: Negative for shortness of breath.   Cardiovascular: Negative for chest pain, palpitations and leg swelling.  Neurological: Negative for dizziness and light-headedness.    Patient Active Problem List   Diagnosis Date Noted  . Hearing loss 09/04/2015  . Bone marrow suppression   . Thrombocytopenia (HCC)   . Acute renal failure (ARF) (HCC) 06/24/2015  . Pancytopenia (HCC) 06/24/2015  . Status post lumbar laminectomy 06/10/2015  . Headache 06/05/2015  . 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 Morbid obesity (HCC) 11/13/2014  . Bilateral low back pain without sciatica 04/14/2014    Current Outpatient Medications on File Prior to Visit  Medication  Sig Dispense Refill  . cyclobenzaprine (FLEXERIL) 10 MG tablet Take by mouth as needed.     . DUEXIS 800-26.6 MG TABS Take 1 tablet by mouth 3 (three) times daily.  0  . Glucosamine-Chondroit-Vit C-Mn (GLUCOSAMINE 1500 COMPLEX) CAPS     . HYDROmorphone (DILAUDID) 2 MG tablet Take by mouth every 4 (four) hours as needed for severe pain (Take 1-2 every 4 hours as needed).    . tadalafil (CIALIS) 5 MG tablet tadalafil 5 mg tablet  TAKE ONE TO FOUR TABLETS BY MOUTH ONE HOUR PRIOR TO INTERCOURSE.    Marland Kitchen UNABLE TO FIND c authorized substitute 2     No current facility-administered medications on file prior to visit.     Allergies  Allergen Reactions  . Hydrocodone Hives and Itching  . Oxycodone     Mild Itching, patient can tolerate oxycodone  . Percocet [Oxycodone-Acetaminophen]     Itching, patient says he tolerates      Social History   Socioeconomic History  . Marital status: Married    Spouse name: Not on file  . Number of children: Not on file  . Years of education: Not on file  . Highest education level: Not on file  Occupational History  . Not on file  Social Needs  . Financial resource strain: Not on file  . Food insecurity:    Worry: Not on file    Inability: Not on file  . Transportation needs:    Medical: Not on file    Non-medical: Not on file  Tobacco Use  . Smoking status: Former Smoker    Packs/day: 0.50    Years: 27.00    Pack years: 13.50    Types: Cigarettes    Last attempt to quit: 03/11/2015    Years since quitting: 2.6  . Smokeless tobacco: Former Engineer, waterUser  Substance and Sexual Activity  . Alcohol use: Yes    Alcohol/week: 0.0 oz    Comment: 04/16/2014 "a 6 pack will last me a month"  . Drug use: No    Comment: 04/16/2014 "in my teens"  . Sexual activity: Yes  Lifestyle  . Physical activity:    Days per week: Not on file    Minutes per session: Not on file  . Stress: Not on file  Relationships  . Social connections:    Talks on phone: Not on  file    Gets together: Not on file    Attends religious service: Not on file    Active member of club or organization: Not on file    Attends meetings of clubs or organizations: Not on file    Relationship status: Not on file  . Intimate partner violence:    Fear of current or ex partner: Not on file    Emotionally abused: Not on file    Physically abused: Not on file    Forced sexual activity: Not on file  Other Topics Concern  . Not on file  Social History Narrative   Drinks 2 cups of coffee in the morning.    Objective:  BP 118/80   Pulse 63   Temp 98 F (36.7 C) (Oral)   Resp 16   Ht 5' 9.29" (1.76 m)   Wt 245 lb (111.1 kg)   SpO2 98%   BMI 35.88 kg/m   Physical Exam  Constitutional: He is oriented to person, place, and time. He appears well-developed and well-nourished. No distress.  HENT:  Head: Normocephalic and atraumatic.  Eyes: Conjunctivae are normal.  Neck: Normal range of motion.  Pulmonary/Chest: Effort normal.  Abdominal: Soft. Normal appearance. There is no tenderness.  Genitourinary: Rectal exam shows fissure ( At 12 o'clock position) and tenderness ( With palpation of anal fissure). Rectal exam shows no external hemorrhoid, no internal hemorrhoid, no mass and anal tone normal.  Genitourinary Comments: CMA chaperone present for exam.  Neurological: He is alert and oriented to person, place, and time.  Skin: Skin is warm and dry.  Psychiatric: He has a normal mood and affect.  Vitals reviewed.   Assessment and Plan :  1. Anal fissure Anal fissure noted on exam, appears to be healing.  Given Rx for diltiazem and lidocaine gel.  Strongly encouraged patient to discontinue sitting on the toilet without having a bowel movement for longer than 5 minutes.  Due to chronic use of pain medication, recommended daily stool softener and increasing both fiber and water in diet.  Advised to return to clinic if symptoms worsen, do not improve, or as needed. Pt to return  in 2 weeks for follow up, will complete CPE at that time. - diltiazem 2 % GEL; Apply a pea-sized amount to the affected area 3 times daily.  Dispense: 30 g; Refill: 0 - Lidocaine, Anorectal, 5 % GEL; Apply a pea-sized amount to the affected area 3 times daily.  Dispense: 30 g; Refill: 0  2. Bloody stools No red flags noted in hx. Healing anal fissure noted which is likely the source of bright red blood on stool.  Reassuring that it  that sx have improved. Rec tx and evaluation of GI profile considering pt has had some diarrhea over the past week.  - GI Profile, Stool, PCR  3. Proctalgia fugax Long-standing issue.  Patient uses diltiazem with full relief.  Educated patient that diltiazem can lower blood pressure and heart rate and that he should monitor this closely if he is going to use medication.  Can consider trying half tablet for relief instead of 1 full tablet. Do not use if BP is < 100/60 or heart rate < 60. - diltiazem (CARDIZEM) 30 MG tablet; Prn rectal pain  Dispense: 20 tablet; Refill: 0   Side effects, risks, benefits, and alternatives of the medications and treatment plan prescribed today were discussed, and patient expressed understanding of the instructions given. No barriers to understanding were identified. Red flags discussed in detail. Pt expressed understanding regarding what to do in case of emergency/urgent symptoms.  Benjiman Core PA-C  Primary Care at [facility] 11/15/2017 9:35 AM

## 2017-11-17 LAB — GI PROFILE, STOOL, PCR

## 2017-12-08 ENCOUNTER — Telehealth: Payer: Self-pay | Admitting: Physician Assistant

## 2017-12-08 ENCOUNTER — Encounter: Payer: Self-pay | Admitting: Physician Assistant

## 2017-12-08 ENCOUNTER — Ambulatory Visit (INDEPENDENT_AMBULATORY_CARE_PROVIDER_SITE_OTHER): Payer: 59 | Admitting: Physician Assistant

## 2017-12-08 ENCOUNTER — Other Ambulatory Visit: Payer: Self-pay

## 2017-12-08 VITALS — BP 116/78 | HR 69 | Temp 98.6°F | Resp 18 | Ht 68.31 in | Wt 240.4 lb

## 2017-12-08 DIAGNOSIS — Z13220 Encounter for screening for lipoid disorders: Secondary | ICD-10-CM

## 2017-12-08 DIAGNOSIS — Z1329 Encounter for screening for other suspected endocrine disorder: Secondary | ICD-10-CM

## 2017-12-08 DIAGNOSIS — Z0000 Encounter for general adult medical examination without abnormal findings: Secondary | ICD-10-CM

## 2017-12-08 DIAGNOSIS — Z130 Encounter for screening for diseases of the blood and blood-forming organs and certain disorders involving the immune mechanism: Secondary | ICD-10-CM

## 2017-12-08 DIAGNOSIS — Z1389 Encounter for screening for other disorder: Secondary | ICD-10-CM

## 2017-12-08 DIAGNOSIS — R41840 Attention and concentration deficit: Secondary | ICD-10-CM

## 2017-12-08 DIAGNOSIS — Z113 Encounter for screening for infections with a predominantly sexual mode of transmission: Secondary | ICD-10-CM

## 2017-12-08 DIAGNOSIS — Z13228 Encounter for screening for other metabolic disorders: Secondary | ICD-10-CM

## 2017-12-08 MED ORDER — FLUTICASONE PROPIONATE 50 MCG/ACT NA SUSP: 2.0000 | Freq: Every day | NASAL | 0 refills | Status: DC

## 2017-12-08 NOTE — Progress Notes (Signed)
Bruce Morales  MRN: [id number] DOB: Feb 19, 1976  Subjective:  Pt is a 42 y.o. male who presents for annual physical exam. He is fasting today.   Diet: "roller coaster" typically does not eat breakfast. Will eat oats, chicken, sides include broccoli, asparagus, potatoes, canned corn, canned beans, basil, lettuce, cheetos, tomatoes, hamburger. Drinks soda and water. Will take multivitamin occasionally.   Exercise: "not in a way where I can really say yes" will pedal on stationary bicycle once per week.   BM: daily  Last dental exam: "been a while," brushes 1-2 x day Last vision exam: Years ago  Vaccinations      Tetanus: 2014      Issues he would like to discuss: 1) Difficulty with word recollection for the past 3 months.  Patient reports he has had trouble finding certain words for objects.  Denies memory loss, confusion, slurred speech, visual disturbance.  Does admit that he is stressed about certain things but believes trying will help with the stress.  Has never been evaluated for underlying mental health disorder.  Patient Active Problem List   Diagnosis Date Noted  . Hearing loss 09/04/2015  . Bone marrow suppression   . Thrombocytopenia ([place])   . Acute renal failure (ARF) ([place]) 06/24/2015  . Pancytopenia ([place]) 06/24/2015  . Status post lumbar laminectomy 06/10/2015  . Headache 06/05/2015  . 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 Morbid obesity ([place]) 11/13/2014  . Bilateral low back pain without sciatica 04/14/2014    Current Outpatient Medications on File Prior to Visit  Medication Sig Dispense Refill  . cyclobenzaprine (FLEXERIL) 10 MG tablet Take by mouth as needed.     . diltiazem (CARDIZEM) 30 MG tablet Prn rectal pain 20 tablet 0  . diltiazem 2 % GEL Apply a pea-sized amount to the affected area 3 times daily. 30 g 0  . DUEXIS 800-26.6 MG TABS Take 1 tablet by mouth 3 (three) times daily.  0  . Glucosamine-Chondroit-Vit C-Mn (GLUCOSAMINE  1500 COMPLEX) CAPS     . HYDROmorphone (DILAUDID) 2 MG tablet Take by mouth every 4 (four) hours as needed for severe pain (Take 1-2 every 4 hours as needed).    . Lidocaine, Anorectal, 5 % GEL Apply a pea-sized amount to the affected area 3 times daily. 30 g 0  . tadalafil (CIALIS) 5 MG tablet tadalafil 5 mg tablet  TAKE ONE TO FOUR TABLETS BY MOUTH ONE HOUR PRIOR TO INTERCOURSE.    Marland Kitchen UNABLE TO FIND c authorized substitute 2     No current facility-administered medications on file prior to visit.     Allergies  Allergen Reactions  . Hydrocodone Hives and Itching  . Oxycodone     Mild Itching, patient can tolerate oxycodone  . Percocet [Oxycodone-Acetaminophen]     Itching, patient says he tolerates    Social History   Socioeconomic History  . Marital status: Married    Spouse name: Not on file  . Number of children: Not on file  . Years of education: Not on file  . Highest education level: Not on file  Occupational History  . Not on file  Social Needs  . Financial resource strain: Not on file  . Food insecurity:    Worry: Not on file    Inability: Not on file  . Transportation needs:    Medical: Not on file    Non-medical: Not on file  Tobacco Use  . Smoking status: Former Smoker  Packs/day: 0.50    Years: 27.00    Pack years: 13.50    Types: Cigarettes    Last attempt to quit: 03/11/2015    Years since quitting: 2.7  . Smokeless tobacco: Former Network engineer and Sexual Activity  . Alcohol use: Yes    Alcohol/week: 0.0 oz    Comment: 04/16/2014 "a 6 pack will last me a month"  . Drug use: No    Comment: 04/16/2014 "in my teens"  . Sexual activity: Yes  Lifestyle  . Physical activity:    Days per week: Not on file    Minutes per session: Not on file  . Stress: Not on file  Relationships  . Social connections:    Talks on phone: Not on file    Gets together: Not on file    Attends religious service: Not on file    Active member of club or  organization: Not on file    Attends meetings of clubs or organizations: Not on file    Relationship status: Not on file  Other Topics Concern  . Not on file  Social History Narrative   Drinks 2 cups of coffee in the morning.    Past Surgical History:  Procedure Laterality Date  . CHOLECYSTECTOMY  2011  . ESOPHAGOGASTRODUODENOSCOPY N/A 02/27/2013   Procedure: ESOPHAGOGASTRODUODENOSCOPY (EGD);  Surgeon: Cleotis Nipper, MD;  Location: [facility];  Service: Endoscopy;  Laterality: N/A;  . KNEE ARTHROSCOPY Right 2013 X 2  . LEFT HEART CATHETERIZATION WITH CORONARY ANGIOGRAM N/A 04/17/2014   Procedure: LEFT HEART CATHETERIZATION WITH CORONARY ANGIOGRAM;  Surgeon: Birdie Riddle, MD;  Location: [facility];  Service: Cardiovascular;  Laterality: N/A;  . RADIOLOGY WITH ANESTHESIA N/A 06/11/2015   Procedure: MRI LUMBAR WITH AND WITHOUT CONTRAST;  Surgeon: Medication Radiologist, MD;  Location: [facility];  Service: Radiology;  Laterality: N/A;    Family History  Problem Relation Age of Onset  . Multiple sclerosis Mother   . Heart failure Maternal Grandfather   . Diabetes Father   . Fibromyalgia Sister   . Schizophrenia Brother   . Stroke Maternal Grandmother   . Diabetes Paternal Grandmother     Review of Systems  Constitutional: Negative for activity change, appetite change, chills, diaphoresis, fatigue, fever and unexpected weight change.  HENT: Positive for ear pain (right ear pain x months). Negative for congestion, dental problem, drooling, ear discharge, facial swelling, hearing loss, mouth sores, nosebleeds, postnasal drip, rhinorrhea, sinus pressure, sinus pain, sneezing, sore throat, tinnitus, trouble swallowing and voice change.   Eyes: Negative for photophobia, pain, discharge, redness, itching and visual disturbance.  Respiratory: Negative for apnea, cough, choking, chest tightness, shortness of breath, wheezing and stridor.   Cardiovascular: Negative for chest pain,  palpitations and leg swelling.  Gastrointestinal: Positive for blood in stool. Negative for abdominal distention, abdominal pain, anal bleeding, constipation, diarrhea, nausea, rectal pain and vomiting.  Endocrine: Negative for cold intolerance, heat intolerance, polydipsia, polyphagia and polyuria.  Genitourinary: Negative for decreased urine volume, difficulty urinating, discharge, dysuria, enuresis, flank pain, frequency, genital sores, hematuria, penile pain, penile swelling, scrotal swelling, testicular pain and urgency.  Musculoskeletal: Positive for arthralgias, back pain, gait problem, joint swelling, myalgias, neck pain and neck stiffness.  Skin: Negative for color change, pallor, rash and wound.  Allergic/Immunologic: Negative for environmental allergies, food allergies and immunocompromised state.  Neurological: Positive for weakness, numbness (in right arm and right leg x months, followed by ortho for this) and headaches. Negative for dizziness, tremors,  seizures, syncope, facial asymmetry, speech difficulty and light-headedness.  Hematological: Negative for adenopathy. Does not bruise/bleed easily.  Psychiatric/Behavioral: Negative for agitation, behavioral problems, confusion, decreased concentration, dysphoric mood, hallucinations, self-injury, sleep disturbance and suicidal ideas. The patient is not nervous/anxious and is not hyperactive.     Objective:  BP 116/78   Pulse 69   Temp 98.6 F (37 C) (Oral)   Resp 18   Ht 5' 8.31" (1.735 m)   Wt 240 lb 6.4 oz (109 kg)   SpO2 97%   BMI 36.22 kg/m   Physical Exam  Constitutional: He is oriented to person, place, and time. He appears well-developed and well-nourished. No distress.  HENT:  Head: Normocephalic and atraumatic.  Right Ear: Hearing, tympanic membrane, external ear and ear canal normal.  Left Ear: Hearing, tympanic membrane, external ear and ear canal normal.  Nose: Nose normal.  Mouth/Throat: Uvula is midline,  oropharynx is clear and moist and mucous membranes are normal. No oropharyngeal exudate.  Eyes: Pupils are equal, round, and reactive to light. Conjunctivae and EOM are normal.  Neck: Trachea normal and normal range of motion.  Cardiovascular: Normal rate, regular rhythm, normal heart sounds and intact distal pulses.  Pulmonary/Chest: Effort normal and breath sounds normal.  Abdominal: Soft. Normal appearance and bowel sounds are normal.  Musculoskeletal: Normal range of motion.  Lymphadenopathy:       Head (right side): No submental, no submandibular, no tonsillar, no preauricular, no posterior auricular and no occipital adenopathy present.       Head (left side): No submental, no submandibular, no tonsillar, no preauricular, no posterior auricular and no occipital adenopathy present.    He has no cervical adenopathy.       Right: No supraclavicular adenopathy present.       Left: No supraclavicular adenopathy present.  Neurological: He is alert and oriented to person, place, and time. He has normal strength and normal reflexes.  Skin: Skin is warm and dry.  Psychiatric: His mood appears anxious. He is hyperactive.  Pressured speech noted.   Vitals reviewed.   Visual Acuity Screening   Right eye Left eye Both eyes  Without correction: '20/15 20/13 20/13 '  With correction:       Assessment and Plan :  Discussed healthy lifestyle, diet, exercise, preventative care, vaccinations, and addressed patient's concerns. Plan for follow up in one year. Otherwise, plan for specific conditions below.  1. Annual physical exam Await lab results.   2. Screening for deficiency anemia - CMP14+EGFR  3. Screening for metabolic disorder - CBC with Differential/Platelet  4. Screening cholesterol level - Lipid panel  5. Screening for thyroid disorder - TSH  6. Screening for hematuria or proteinuria - Urinalysis, dipstick only  7. Screen for STD (sexually transmitted disease) - GC/Chlamydia  Probe Amp - HIV antibody - Hepatitis panel, acute - RPR - Trichomonas vaginalis, RNA  8. Attention deficit No issues with word recollection noted today in office.  Patient does express pressured speech.  It is difficult for him to answer any particular question without a very long, detailed, circumferential response.  He also endorses fear about every particular aspect of his health.  I believe he warrants further evaluation by neuropsychologist.  Suspect underlying mental illness.  - Ambulatory referral to Neuropsychology   Tenna Delaine, PA-C  Primary Care at [place] 12/10/2017 1:14 PM

## 2017-12-08 NOTE — Telephone Encounter (Signed)
Copied from CRM [id number]. Topic: General - Other >> Dec 08, 2017 11:12 AM Tamela OddiHarris, Brenda J wrote: Reason for CRM: Patient called to speak with doctor about getting a cancer screening since he is a former smoker.  Please advise.  CB# [phone].

## 2017-12-08 NOTE — Patient Instructions (Addendum)
Health Maintenance, Male A healthy lifestyle and preventive care is important for your health and wellness. Ask your health care provider about what schedule of regular examinations is right for you. What should I know about weight and diet? Eat a Healthy Diet  Eat plenty of vegetables, fruits, whole grains, low-fat dairy products, and lean protein.  Do not eat a lot of foods high in solid fats, added sugars, or salt.  Maintain a Healthy Weight Regular exercise can help you achieve or maintain a healthy weight. You should:  Do at least 150 minutes of exercise each week. The exercise should increase your heart rate and make you sweat (moderate-intensity exercise).  Do strength-training exercises at least twice a week.  Watch Your Levels of Cholesterol and Blood Lipids  Have your blood tested for lipids and cholesterol every 5 years starting at 40 years of age. If you are at high risk for heart disease, you should start having your blood tested when you are 40 years old. You may need to have your cholesterol levels checked more often if: ? Your lipid or cholesterol levels are high. ? You are older than 40 years of age. ? You are at high risk for heart disease.  What should I know about cancer screening? Many types of cancers can be detected early and may often be prevented. Lung Cancer  You should be screened every year for lung cancer if: ? You are a current smoker who has smoked for at least 30 years. ? You are a former smoker who has quit within the past 15 years.  Talk to your health care provider about your screening options, when you should start screening, and how often you should be screened.  Colorectal Cancer  Routine colorectal cancer screening usually begins at 40 years of age and should be repeated every 5-10 years until you are 40 years old. You may need to be screened more often if early forms of precancerous polyps or small growths are found. Your health care provider  may recommend screening at an earlier age if you have risk factors for colon cancer.  Your health care provider may recommend using home test kits to check for hidden blood in the stool.  A small camera at the end of a tube can be used to examine your colon (sigmoidoscopy or colonoscopy). This checks for the earliest forms of colorectal cancer.  Prostate and Testicular Cancer  Depending on your age and overall health, your health care provider may do certain tests to screen for prostate and testicular cancer.  Talk to your health care provider about any symptoms or concerns you have about testicular or prostate cancer.  Skin Cancer  Check your skin from head to toe regularly.  Tell your health care provider about any new moles or changes in moles, especially if: ? There is a change in a mole's size, shape, or color. ? You have a mole that is larger than a pencil eraser.  Always use sunscreen. Apply sunscreen liberally and repeat throughout the day.  Protect yourself by wearing long sleeves, pants, a wide-brimmed hat, and sunglasses when outside.  What should I know about heart disease, diabetes, and high blood pressure?  If you are 18-39 years of age, have your blood pressure checked every 3-5 years. If you are 40 years of age or older, have your blood pressure checked every year. You should have your blood pressure measured twice-once when you are at a hospital or clinic, and once when   you are not at a hospital or clinic. Record the average of the two measurements. To check your blood pressure when you are not at a hospital or clinic, you can use: ? An automated blood pressure machine at a pharmacy. ? A home blood pressure monitor.  Talk to your health care provider about your target blood pressure.  If you are between 45-79 years old, ask your health care provider if you should take aspirin to prevent heart disease.  Have regular diabetes screenings by checking your fasting blood  sugar level. ? If you are at a normal weight and have a low risk for diabetes, have this test once every three years after the age of 45. ? If you are overweight and have a high risk for diabetes, consider being tested at a younger age or more often.  A one-time screening for abdominal aortic aneurysm (AAA) by ultrasound is recommended for men aged 65-75 years who are current or former smokers. What should I know about preventing infection? Hepatitis B If you have a higher risk for hepatitis B, you should be screened for this virus. Talk with your health care provider to find out if you are at risk for hepatitis B infection. Hepatitis C Blood testing is recommended for:  Everyone born from 1945 through 1965.  Anyone with known risk factors for hepatitis C.  Sexually Transmitted Diseases (STDs)  You should be screened each year for STDs including gonorrhea and chlamydia if: ? You are sexually active and are younger than 40 years of age. ? You are older than 40 years of age and your health care provider tells you that you are at risk for this type of infection. ? Your sexual activity has changed since you were last screened and you are at an increased risk for chlamydia or gonorrhea. Ask your health care provider if you are at risk.  Talk with your health care provider about whether you are at high risk of being infected with HIV. Your health care provider may recommend a prescription medicine to help prevent HIV infection.  What else can I do?  Schedule regular health, dental, and eye exams.  Stay current with your vaccines (immunizations).  Do not use any tobacco products, such as cigarettes, chewing tobacco, and e-cigarettes. If you need help quitting, ask your health care provider.  Limit alcohol intake to no more than 2 drinks per day. One drink equals 12 ounces of beer, 5 ounces of wine, or 1 ounces of hard liquor.  Do not use street drugs.  Do not share needles.  Ask your  health care provider for help if you need support or information about quitting drugs.  Tell your health care provider if you often feel depressed.  Tell your health care provider if you have ever been abused or do not feel safe at home. This information is not intended to replace advice given to you by your health care provider. Make sure you discuss any questions you have with your health care provider. Document Released: 11/13/2007 Document Revised: 01/14/2016 Document Reviewed: 02/18/2015 Elsevier Interactive Patient Education  2018 [employer]     IF you received an x-ray today, you will receive an invoice from [facility]. Please contact [facility] at [phone] with questions or concerns regarding your invoice.   IF you received labwork today, you will receive an invoice from [facility]. Please contact [facility] at [phone] with questions or concerns regarding your invoice.   Our billing staff will not be   able to assist you with questions regarding bills from these companies.  You will be contacted with the lab results as soon as they are available. The fastest way to get your results is to activate your My Chart account. Instructions are located on the last page of this paperwork. If you have not heard from us regarding the results in 2 weeks, please contact this office.       

## 2017-12-09 LAB — HIV ANTIBODY (ROUTINE TESTING W REFLEX): HIV Screen 4th Generation wRfx: NONREACTIVE

## 2017-12-09 LAB — CBC WITH DIFFERENTIAL/PLATELET
Basophils Absolute: 0 10*3/uL (ref 0.0–0.2)
Basos: 0 %
EOS (ABSOLUTE): 0.1 10*3/uL (ref 0.0–0.4)
Eos: 2 %
Hematocrit: 47 % (ref 37.5–51.0)
Hemoglobin: 15.1 g/dL (ref 13.0–17.7)
Immature Grans (Abs): 0 10*3/uL (ref 0.0–0.1)
Immature Granulocytes: 0 %
Lymphocytes Absolute: 2.1 10*3/uL (ref 0.7–3.1)
Lymphs: 37 %
MCH: 29 pg (ref 26.6–33.0)
MCHC: 32.1 g/dL (ref 31.5–35.7)
MCV: 90 fL (ref 79–97)
Monocytes Absolute: 0.5 10*3/uL (ref 0.1–0.9)
Monocytes: 9 %
Neutrophils Absolute: 2.9 10*3/uL (ref 1.4–7.0)
Neutrophils: 52 %
Platelets: 145 10*3/uL — ABNORMAL LOW (ref 150–450)
RBC: 5.21 x10E6/uL (ref 4.14–5.80)
RDW: 13.5 % (ref 12.3–15.4)
WBC: 5.6 10*3/uL (ref 3.4–10.8)

## 2017-12-09 LAB — CMP14+EGFR
ALT: 51 IU/L — ABNORMAL HIGH (ref 0–44)
AST: 21 IU/L (ref 0–40)
Albumin/Globulin Ratio: 2.2 (ref 1.2–2.2)
Albumin: 4.7 g/dL (ref 3.5–5.5)
Alkaline Phosphatase: 62 IU/L (ref 39–117)
BUN/Creatinine Ratio: 16 (ref 9–20)
BUN: 17 mg/dL (ref 6–20)
Bilirubin Total: 0.6 mg/dL (ref 0.0–1.2)
CO2: 20 mmol/L (ref 20–29)
Calcium: 9.4 mg/dL (ref 8.7–10.2)
Chloride: 104 mmol/L (ref 96–106)
Creatinine, Ser: 1.07 mg/dL (ref 0.76–1.27)
GFR calc Af Amer: 101 mL/min/{1.73_m2} (ref >59)
GFR calc non Af Amer: 87 mL/min/{1.73_m2} (ref >59)
Globulin, Total: 2.1 g/dL (ref 1.5–4.5)
Glucose: 96 mg/dL (ref 65–99)
Potassium: 4.6 mmol/L (ref 3.5–5.2)
Sodium: 142 mmol/L (ref 134–144)
Total Protein: 6.8 g/dL (ref 6.0–8.5)

## 2017-12-09 LAB — LIPID PANEL
Chol/HDL Ratio: 6.4 ratio — ABNORMAL HIGH (ref 0.0–5.0)
Cholesterol, Total: 224 mg/dL — ABNORMAL HIGH (ref 100–199)
HDL: 35 mg/dL — ABNORMAL LOW (ref >39)
LDL Calculated: 164 mg/dL — ABNORMAL HIGH (ref 0–99)
Triglycerides: 126 mg/dL (ref 0–149)
VLDL Cholesterol Cal: 25 mg/dL (ref 5–40)

## 2017-12-09 LAB — HEPATITIS PANEL, ACUTE
Hep A IgM: NEGATIVE
Hep B C IgM: NEGATIVE
Hep C Virus Ab: <0.1 s/co ratio (ref 0.0–0.9)
Hepatitis B Surface Ag: NEGATIVE

## 2017-12-09 LAB — URINALYSIS, DIPSTICK ONLY
Bilirubin, UA: NEGATIVE
Glucose, UA: NEGATIVE
Ketones, UA: NEGATIVE
Leukocytes, UA: NEGATIVE
Nitrite, UA: NEGATIVE
Protein, UA: NEGATIVE
RBC, UA: NEGATIVE
Specific Gravity, UA: 1.029 (ref 1.005–1.030)
Urobilinogen, Ur: 0.2 mg/dL (ref 0.2–1.0)
pH, UA: 5.5 (ref 5.0–7.5)

## 2017-12-09 LAB — TSH: TSH: 2.12 u[IU]/mL (ref 0.450–4.500)

## 2017-12-09 LAB — RPR: RPR Ser Ql: NONREACTIVE

## 2017-12-09 NOTE — Telephone Encounter (Signed)
Please see note below. 

## 2017-12-09 NOTE — Telephone Encounter (Signed)
After reading the AVS from his visit yesterday, he feels he is a candidate for screening. He began smoking at the age of 42yo and quit 2 yr ago. Hes states he's been smoking a pk a day for the past 29 yrs.  His question is, Why can't he have this screening? He currently feels that he is being prevented in taking responsibility in his preventative care. Please advised.

## 2017-12-09 NOTE — Telephone Encounter (Signed)
Please call pt. He does not meet recommendations for lung cancer screening at this time. Per recommendations: USPSTF recommends annual screening for lung cancer with low-dose computed tomography (LDCT) in adults aged 40 to 80 years who have a 30 pack-year smoking history and currently smoke or have quit within the past 15 years.  1) he is only [age] years old  2) he only has a 27 pack year history  Therefore, if he continues to be tobacco free (which we recommend!) he will not even have to have screening when he turns 57. Hope this helps.

## 2017-12-09 NOTE — Telephone Encounter (Signed)
Called pt and personally explained: For individuals who have accumulated fewer than 30 pack-years of smoking or who are younger than age 40 years or older than age 40 years, or who have quit smoking more than 15 years ago, and do not have a high risk of having/developing lung cancer based on clinical risk prediction calculators, the panel recommends that low-dose CT screening should not be performed.   Patient now states he has a 29-pack-year history not 27 pack year history.  He thinks he should be allowed to have a CT of his chest look for cancer since he is just one year under what is recommended. I once again explained the guidelines and that the risk of radiation exposure could be more detrimetnal to his health in his circumstance. He then states "I have had lots of CTs in the past."  I encouraged him to continue with smoking cessation as this is the best thing he can be doing for lung cancer prevention at this time. He eventually thanked me for my call.

## 2017-12-10 ENCOUNTER — Encounter: Payer: Self-pay | Admitting: Physician Assistant

## 2017-12-10 LAB — TRICHOMONAS VAGINALIS, PROBE AMP: Trich vag by NAA: NEGATIVE

## 2017-12-10 LAB — GC/CHLAMYDIA PROBE AMP
Chlamydia trachomatis, NAA: NEGATIVE
Neisseria gonorrhoeae by PCR: NEGATIVE

## 2017-12-30 ENCOUNTER — Other Ambulatory Visit: Payer: Self-pay | Admitting: Physician Assistant

## 2018-01-31 DIAGNOSIS — S83241A Other tear of medial meniscus, current injury, right knee, initial encounter: Secondary | ICD-10-CM | POA: Diagnosis not present

## 2018-02-02 ENCOUNTER — Other Ambulatory Visit: Payer: Self-pay | Admitting: Orthopedic Surgery

## 2018-02-02 DIAGNOSIS — M25561 Pain in right knee: Secondary | ICD-10-CM

## 2018-02-05 ENCOUNTER — Ambulatory Visit
Admission: RE | Admit: 2018-02-05 | Discharge: 2018-02-05 | Disposition: A | Discharge status: Home / Self Care | Payer: 59 | Source: Ambulatory Visit | Attending: Orthopedic Surgery | Admitting: Orthopedic Surgery

## 2018-02-05 DIAGNOSIS — M25561 Pain in right knee: Secondary | ICD-10-CM

## 2018-02-08 DIAGNOSIS — S83241A Other tear of medial meniscus, current injury, right knee, initial encounter: Secondary | ICD-10-CM | POA: Diagnosis not present

## 2018-02-22 DIAGNOSIS — Y999 Unspecified external cause status: Secondary | ICD-10-CM | POA: Diagnosis not present

## 2018-02-22 DIAGNOSIS — M94261 Chondromalacia, right knee: Secondary | ICD-10-CM | POA: Diagnosis not present

## 2018-02-22 DIAGNOSIS — S83241A Other tear of medial meniscus, current injury, right knee, initial encounter: Secondary | ICD-10-CM | POA: Diagnosis not present

## 2018-02-22 DIAGNOSIS — G8918 Other acute postprocedural pain: Secondary | ICD-10-CM | POA: Diagnosis not present

## 2018-02-22 DIAGNOSIS — X58XXXA Exposure to other specified factors, initial encounter: Secondary | ICD-10-CM | POA: Diagnosis not present

## 2018-02-22 HISTORY — PX: KNEE ARTHROSCOPY: SUR90

## 2018-03-02 DIAGNOSIS — M25661 Stiffness of right knee, not elsewhere classified: Secondary | ICD-10-CM | POA: Diagnosis not present

## 2018-03-02 DIAGNOSIS — M23221 Derangement of posterior horn of medial meniscus due to old tear or injury, right knee: Secondary | ICD-10-CM | POA: Diagnosis not present

## 2018-03-13 DIAGNOSIS — M23221 Derangement of posterior horn of medial meniscus due to old tear or injury, right knee: Secondary | ICD-10-CM | POA: Diagnosis not present

## 2018-03-13 DIAGNOSIS — M25661 Stiffness of right knee, not elsewhere classified: Secondary | ICD-10-CM | POA: Diagnosis not present

## 2018-03-14 ENCOUNTER — Other Ambulatory Visit: Payer: Self-pay

## 2018-03-14 ENCOUNTER — Encounter: Payer: Self-pay | Admitting: Physician Assistant

## 2018-03-14 ENCOUNTER — Ambulatory Visit (INDEPENDENT_AMBULATORY_CARE_PROVIDER_SITE_OTHER): Payer: Medicare Other | Admitting: Physician Assistant

## 2018-03-14 VITALS — BP 113/73 | HR 67 | Temp 98.2°F | Resp 20 | Ht 68.9 in | Wt 251.8 lb

## 2018-03-14 DIAGNOSIS — K594 Anal spasm: Secondary | ICD-10-CM

## 2018-03-14 DIAGNOSIS — G894 Chronic pain syndrome: Secondary | ICD-10-CM | POA: Diagnosis not present

## 2018-03-14 DIAGNOSIS — Z6837 Body mass index (BMI) 37.0-37.9, adult: Secondary | ICD-10-CM

## 2018-03-14 DIAGNOSIS — E669 Obesity, unspecified: Secondary | ICD-10-CM | POA: Diagnosis not present

## 2018-03-14 DIAGNOSIS — E66812 Obesity, class 2: Secondary | ICD-10-CM

## 2018-03-14 MED ORDER — DILTIAZEM HCL 30 MG PO TABS: ORAL_TABLET | ORAL | 0 refills | Status: DC

## 2018-03-14 MED ORDER — CYCLOBENZAPRINE HCL 10 MG PO TABS: 10.0000 mg | ORAL_TABLET | ORAL | 0 refills | Status: DC | PRN

## 2018-03-14 NOTE — Patient Instructions (Addendum)
If you have lab work done today you will be contacted with your lab results within the next 2 weeks.  If you have not heard from [place] then please contact us. The fastest way to get your results is to register for My Chart.  Calorie Counting for Weight Loss Calories are units of energy. Your body needs a certain amount of calories from food to keep you going throughout the day. When you eat more calories than your body needs, your body stores the extra calories as fat. When you eat fewer calories than your body needs, your body burns fat to get the energy it needs. Calorie counting means keeping track of how many calories you eat and drink each day. Calorie counting can be helpful if you need to lose weight. If you make sure to eat fewer calories than your body needs, you should lose weight. Ask your health care provider what a healthy weight is for you. For calorie counting to work, you will need to eat the right number of calories in a day in order to lose a healthy amount of weight per week. A dietitian can help you determine how many calories you need in a day and will give you suggestions on how to reach your calorie goal.  A healthy amount of weight to lose per week is usually 1-2 lb (0.5-0.9 kg). This usually means that your daily calorie intake should be reduced by 500-750 calories.  Eating 1,200 - 1,500 calories per day can help most women lose weight.  Eating 1,500 - 1,800 calories per day can help most men lose weight.  What is my plan? My goal is to have __________ calories per day. If I have this many calories per day, I should lose around __________ pounds per week. What do I need to know about calorie counting? In order to meet your daily calorie goal, you will need to:  Find out how many calories are in each food you would like to eat. Try to do this before you eat.  Decide how much of the food you plan to eat.  Write down what you ate and how many calories it had. Doing  this is called keeping a food log.  To successfully lose weight, it is important to balance calorie counting with a healthy lifestyle that includes regular activity. Aim for 150 minutes of moderate exercise (such as walking) or 75 minutes of vigorous exercise (such as running) each week. Where do I find calorie information?  The number of calories in a food can be found on a Nutrition Facts label. If a food does not have a Nutrition Facts label, try to look up the calories online or ask your dietitian for help. Remember that calories are listed per serving. If you choose to have more than one serving of a food, you will have to multiply the calories per serving by the amount of servings you plan to eat. For example, the label on a package of bread might say that a serving size is 1 slice and that there are 90 calories in a serving. If you eat 1 slice, you will have eaten 90 calories. If you eat 2 slices, you will have eaten 180 calories. How do I keep a food log? Immediately after each meal, record the following information in your food log:  What you ate. Don't forget to include toppings, sauces, and other extras on the food.  How much you ate. This can be measured  in cups, ounces, or number of items.  How many calories each food and drink had.  The total number of calories in the meal.  Keep your food log near you, such as in a small notebook in your pocket, or use a mobile app or website. Some programs will calculate calories for you and show you how many calories you have left for the day to meet your goal. What are some calorie counting tips?  Use your calories on foods and drinks that will fill you up and not leave you hungry: ? Some examples of foods that fill you up are nuts and nut butters, vegetables, lean proteins, and high-fiber foods like whole grains. High-fiber foods are foods with more than 5 g fiber per serving. ? Drinks such as sodas, specialty coffee drinks, alcohol, and  juices have a lot of calories, yet do not fill you up.  Eat nutritious foods and avoid empty calories. Empty calories are calories you get from foods or beverages that do not have many vitamins or protein, such as candy, sweets, and soda. It is better to have a nutritious high-calorie food (such as an avocado) than a food with few nutrients (such as a bag of chips).  Know how many calories are in the foods you eat most often. This will help you calculate calorie counts faster.  Pay attention to calories in drinks. Low-calorie drinks include water and unsweetened drinks.  Pay attention to nutrition labels for "low fat" or "fat free" foods. These foods sometimes have the same amount of calories or more calories than the full fat versions. They also often have added sugar, starch, or salt, to make up for flavor that was removed with the fat.  Find a way of tracking calories that works for you. Get creative. Try different apps or programs if writing down calories does not work for you. What are some portion control tips?  Know how many calories are in a serving. This will help you know how many servings of a certain food you can have.  Use a measuring cup to measure serving sizes. You could also try weighing out portions on a kitchen scale. With time, you will be able to estimate serving sizes for some foods.  Take some time to put servings of different foods on your favorite plates, bowls, and cups so you know what a serving looks like.  Try not to eat straight from a bag or box. Doing this can lead to overeating. Put the amount you would like to eat in a cup or on a plate to make sure you are eating the right portion.  Use smaller plates, glasses, and bowls to prevent overeating.  Try not to multitask (for example, watch TV or use your computer) while eating. If it is time to eat, sit down at a table and enjoy your food. This will help you to know when you are full. It will also help you to be  aware of what you are eating and how much you are eating. What are tips for following this plan? Reading food labels  Check the calorie count compared to the serving size. The serving size may be smaller than what you are used to eating.  Check the source of the calories. Make sure the food you are eating is high in vitamins and protein and low in saturated and trans fats. Shopping  Read nutrition labels while you shop. This will help you make healthy decisions before you decide to  purchase your food.  Make a grocery list and stick to it. Cooking  Try to cook your favorite foods in a healthier way. For example, try baking instead of frying.  Use low-fat dairy products. Meal planning  Use more fruits and vegetables. Half of your plate should be fruits and vegetables.  Include lean proteins like poultry and fish. How do I count calories when eating out?  Ask for smaller portion sizes.  Consider sharing an entree and sides instead of getting your own entree.  If you get your own entree, eat only half. Ask for a box at the beginning of your meal and put the rest of your entree in it so you are not tempted to eat it.  If calories are listed on the menu, choose the lower calorie options.  Choose dishes that include vegetables, fruits, whole grains, low-fat dairy products, and lean protein.  Choose items that are boiled, broiled, grilled, or steamed. Stay away from items that are buttered, battered, fried, or served with cream sauce. Items labeled "crispy" are usually fried, unless stated otherwise.  Choose water, low-fat milk, unsweetened iced tea, or other drinks without added sugar. If you want an alcoholic beverage, choose a lower calorie option such as a glass of wine or light beer.  Ask for dressings, sauces, and syrups on the side. These are usually high in calories, so you should limit the amount you eat.  If you want a salad, choose a garden salad and ask for grilled meats.  Avoid extra toppings like bacon, cheese, or fried items. Ask for the dressing on the side, or ask for olive oil and vinegar or lemon to use as dressing.  Estimate how many servings of a food you are given. For example, a serving of cooked rice is  cup or about the size of half a baseball. Knowing serving sizes will help you be aware of how much food you are eating at restaurants. The list below tells you how big or small some common portion sizes are based on everyday objects: ? 1 oz-4 stacked dice. ? 3 oz-1 deck of cards. ? 1 tsp-1 die. ? 1 Tbsp- a ping-pong ball. ? 2 Tbsp-1 ping-pong ball. ?  cup- baseball. ? 1 cup-1 baseball. Summary  Calorie counting means keeping track of how many calories you eat and drink each day. If you eat fewer calories than your body needs, you should lose weight.  A healthy amount of weight to lose per week is usually 1-2 lb (0.5-0.9 kg). This usually means reducing your daily calorie intake by 500-750 calories.  The number of calories in a food can be found on a Nutrition Facts label. If a food does not have a Nutrition Facts label, try to look up the calories online or ask your dietitian for help.  Use your calories on foods and drinks that will fill you up, and not on foods and drinks that will leave you hungry.  Use smaller plates, glasses, and bowls to prevent overeating. This information is not intended to replace advice given to you by your health care provider. Make sure you discuss any questions you have with your health care provider. Document Released: 05/17/2005 Document Revised: 04/16/2016 Document Reviewed: 04/16/2016 Elsevier Interactive Patient Education  2018 [employer].   IF you received an x-ray today, you will receive an invoice from [facility]. Please contact [facility] at [phone] with questions or concerns regarding your invoice.   IF you received labwork today, you  will receive an invoice from [facility].  Please contact [facility] at [phone] with questions or concerns regarding your invoice.   Our billing staff will not be able to assist you with questions regarding bills from these companies.  You will be contacted with the lab results as soon as they are available. The fastest way to get your results is to activate your My Chart account. Instructions are located on the last page of this paperwork. If you have not heard from [place] regarding the results in 2 weeks, please contact this office.

## 2018-03-14 NOTE — Progress Notes (Signed)
Bruce Morales  MRN: [id number] DOB: 1975-08-05  Subjective:  Bruce Morales is a 42 y.o. male seen in office today for a chief complaint of f/u on weight and refill for cardizem.    Weight: Tried juicing in the morning for breakfast, eating low calorie foods. Salads for dinner. Meat is chicken and fish.  Sticking with vegetables and fruits. Unsure of how many calories. Doing cycling for his knee 2-3 x a week for 15-20 minutes at time. Not having any success with weight loss. His back issues have made it hard to exercise and this is when he started noticing weight gain.   Also needs refill on Flexeril.  Patient has chronic back and knee pain.  Follow by pain management. Does not take nucynta every day. Will not mix flexeril with nucynta. Denies illicit drug use.   Has PMH of hemorrhoids and proctalgia fugax. Has never had a colonoscopy.  Last saw GI at [facility] and 2016. Uses diltiazem 30 mg tablet for rectal spasms prn and has significant relief with medication.Typically has 1-2 spasms per month.     Never picked up the refill given on 11/15/2017.  Would like it to be sent to a different pharmacy.  No FH of colon cancer, crohns, or UC.    Review of Systems  Constitutional: Negative for fatigue.  Respiratory: Negative for shortness of breath.   Cardiovascular: Negative for chest pain and palpitations.  Musculoskeletal: Positive for back pain.    Patient Active Problem List   Diagnosis Date Noted  . Hearing loss 09/04/2015  . Bone marrow suppression   . Thrombocytopenia (HCC)   . Acute renal failure (ARF) (HCC) 06/24/2015  . Pancytopenia (HCC) 06/24/2015  . Status post lumbar laminectomy 06/10/2015  . Headache 06/05/2015  . 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 Morbid obesity (HCC) 11/13/2014  . Bilateral low back pain without sciatica 04/14/2014    Current Outpatient Medications on File Prior to Visit  Medication Sig Dispense Refill  . cyclobenzaprine  (FLEXERIL) 10 MG tablet Take by mouth as needed.     . diltiazem (CARDIZEM) 30 MG tablet Prn rectal pain 20 tablet 0  . diltiazem 2 % GEL Apply a pea-sized amount to the affected area 3 times daily. 30 g 0  . DUEXIS 800-26.6 MG TABS Take 1 tablet by mouth 3 (three) times daily.  0  . fluticasone (FLONASE) 50 MCG/ACT nasal spray SPRAY 2 SPRAYS INTO EACH NOSTRIL EVERY DAY 16 g 3  . Glucosamine-Chondroit-Vit C-Mn (GLUCOSAMINE 1500 COMPLEX) CAPS     . HYDROmorphone (DILAUDID) 2 MG tablet Take by mouth every 4 (four) hours as needed for severe pain (Take 1-2 every 4 hours as needed).    Marland Kitchen UNABLE TO FIND c authorized substitute 2    . Lidocaine, Anorectal, 5 % GEL Apply a pea-sized amount to the affected area 3 times daily. (Patient not taking: Reported on 03/14/2018) 30 g 0  . tadalafil (CIALIS) 5 MG tablet tadalafil 5 mg tablet  TAKE ONE TO FOUR TABLETS BY MOUTH ONE HOUR PRIOR TO INTERCOURSE.     No current facility-administered medications on file prior to visit.     Allergies  Allergen Reactions  . Hydrocodone Hives and Itching  . Oxycodone     Mild Itching, patient can tolerate oxycodone  . Percocet [Oxycodone-Acetaminophen]     Itching, patient says he tolerates     Objective:  BP 113/73   Pulse 67   Temp 98.2 F (36.8  C) (Oral)   Resp 20   Ht 5' 8.9" (1.75 m)   Wt 251 lb 12.8 oz (114.2 kg)   SpO2 95%   BMI 37.29 kg/m   Physical Exam  Constitutional: He is oriented to person, place, and time. He appears well-developed and well-nourished.  HENT:  Head: Normocephalic and atraumatic.  Eyes: Conjunctivae are normal.  Neck: Normal range of motion.  Pulmonary/Chest: Effort normal.  Neurological: He is alert and oriented to person, place, and time.  Skin: Skin is warm and dry.  Psychiatric: He has a normal mood and affect.  Vitals reviewed.    Wt Readings from Last 3 Encounters:  03/14/18 251 lb 12.8 oz (114.2 kg)  12/08/17 240 lb 6.4 oz (109 kg)  11/15/17 245 lb (111.1  kg)    Assessment and Plan :  1. Class 2 obesity without serious comorbidity with body mass index (BMI) of 37.0 to 37.9 in adult, unspecified obesity type Discussed calorie counting for weight loss as pt is limited in amt of exercise he can do considering chronic pain. Rec app such as carbmanager or myfitnesspal. Referral to weight management.  - Amb Ref to Medical Weight Management  2. Proctalgia fugax Pt declines referral to GI.  - diltiazem (CARDIZEM) 30 MG tablet; Prn rectal pain  Dispense: 20 tablet; Refill: 0  3. Chronic pain syndrome Sees pain management. They do not provide refills for flexeril. Discussed with pt that tapentadol may enhance the CNS depressant effect of flexeril. He does not use these in the same day. Refills provided. F/u as needed.   [place] Controlled Substance Database Registry was reviewed and is consistent with patient's history. - cyclobenzaprine (FLEXERIL) 10 MG tablet; Take 1 tablet (10 mg total) by mouth as needed.  Dispense: 30 tablet; Refill: 0  Side effects, risks, benefits, and alternatives of the medications and treatment plan prescribed today were discussed, and patient expressed understanding of the instructions given. No barriers to understanding were identified. Red flags discussed in detail. Pt expressed understanding regarding what to do in case of emergency/urgent symptoms.  A total of 25 minutes was spent in the room with the patient, greater than 50% of which was in counseling/coordination of care regarding obesity.   Benjiman Core PA-C  Primary Care at [facility] 03/14/2018 8:12 AM

## 2018-03-15 DIAGNOSIS — M25661 Stiffness of right knee, not elsewhere classified: Secondary | ICD-10-CM | POA: Diagnosis not present

## 2018-03-15 DIAGNOSIS — M23221 Derangement of posterior horn of medial meniscus due to old tear or injury, right knee: Secondary | ICD-10-CM | POA: Diagnosis not present

## 2018-03-20 DIAGNOSIS — M23221 Derangement of posterior horn of medial meniscus due to old tear or injury, right knee: Secondary | ICD-10-CM | POA: Diagnosis not present

## 2018-03-20 DIAGNOSIS — M25661 Stiffness of right knee, not elsewhere classified: Secondary | ICD-10-CM | POA: Diagnosis not present

## 2018-03-22 DIAGNOSIS — M23221 Derangement of posterior horn of medial meniscus due to old tear or injury, right knee: Secondary | ICD-10-CM | POA: Diagnosis not present

## 2018-03-22 DIAGNOSIS — M25661 Stiffness of right knee, not elsewhere classified: Secondary | ICD-10-CM | POA: Diagnosis not present

## 2018-03-23 ENCOUNTER — Encounter (INDEPENDENT_AMBULATORY_CARE_PROVIDER_SITE_OTHER): Payer: 59

## 2018-03-27 DIAGNOSIS — M25661 Stiffness of right knee, not elsewhere classified: Secondary | ICD-10-CM | POA: Diagnosis not present

## 2018-03-27 DIAGNOSIS — M23221 Derangement of posterior horn of medial meniscus due to old tear or injury, right knee: Secondary | ICD-10-CM | POA: Diagnosis not present

## 2018-03-28 DIAGNOSIS — G5601 Carpal tunnel syndrome, right upper limb: Secondary | ICD-10-CM | POA: Diagnosis not present

## 2018-03-29 DIAGNOSIS — M23221 Derangement of posterior horn of medial meniscus due to old tear or injury, right knee: Secondary | ICD-10-CM | POA: Diagnosis not present

## 2018-03-29 DIAGNOSIS — M25661 Stiffness of right knee, not elsewhere classified: Secondary | ICD-10-CM | POA: Diagnosis not present

## 2018-03-30 ENCOUNTER — Ambulatory Visit (INDEPENDENT_AMBULATORY_CARE_PROVIDER_SITE_OTHER): Payer: Medicare Other | Admitting: Family Medicine

## 2018-03-30 ENCOUNTER — Encounter (INDEPENDENT_AMBULATORY_CARE_PROVIDER_SITE_OTHER): Payer: Self-pay | Admitting: Family Medicine

## 2018-03-30 VITALS — BP 121/74 | HR 75 | Ht 68.0 in | Wt 248.0 lb

## 2018-03-30 DIAGNOSIS — Z0289 Encounter for other administrative examinations: Secondary | ICD-10-CM

## 2018-03-30 DIAGNOSIS — E7849 Other hyperlipidemia: Secondary | ICD-10-CM

## 2018-03-30 DIAGNOSIS — R7303 Prediabetes: Secondary | ICD-10-CM | POA: Diagnosis not present

## 2018-03-30 DIAGNOSIS — R5383 Other fatigue: Secondary | ICD-10-CM

## 2018-03-30 DIAGNOSIS — E639 Nutritional deficiency, unspecified: Secondary | ICD-10-CM | POA: Diagnosis not present

## 2018-03-30 DIAGNOSIS — Z1331 Encounter for screening for depression: Secondary | ICD-10-CM | POA: Diagnosis not present

## 2018-03-30 DIAGNOSIS — Z9189 Other specified personal risk factors, not elsewhere classified: Secondary | ICD-10-CM | POA: Diagnosis not present

## 2018-03-30 DIAGNOSIS — R0602 Shortness of breath: Secondary | ICD-10-CM | POA: Diagnosis not present

## 2018-03-30 DIAGNOSIS — E559 Vitamin D deficiency, unspecified: Secondary | ICD-10-CM | POA: Diagnosis not present

## 2018-03-30 DIAGNOSIS — Z6837 Body mass index (BMI) 37.0-37.9, adult: Secondary | ICD-10-CM | POA: Diagnosis not present

## 2018-03-30 DIAGNOSIS — R945 Abnormal results of liver function studies: Secondary | ICD-10-CM | POA: Diagnosis not present

## 2018-03-30 NOTE — Progress Notes (Signed)
Office: [phone]  /  Fax: [phone]   Dear Bruce Morales,   Thank you for referring Bruce Morales to our clinic. The following note includes my evaluation and treatment recommendations.  HPI:   Chief Complaint: OBESITY    Bruce Morales has been referred by Bruce D. Barnett Abu, PA-C for consultation regarding his obesity and obesity related comorbidities.    Bruce Morales (MR# [id number]) is a 42 y.o. male who presents on 03/30/2018 for obesity evaluation and treatment. Current BMI is Body mass index is 37.71 kg/m.Bruce Morales has been struggling with his weight for many years and has been unsuccessful in either losing weight, maintaining weight loss, or reaching his healthy weight goal.     Bruce Morales eats larger portions than normal. 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      Bruce Morales attended our information session and states he is currently in the action stage of change and ready to dedicate time achieving and maintaining a healthier weight. Bruce Morales is interested in becoming our patient and working on intensive lifestyle modifications including (but not limited to) diet, exercise and weight loss.    Bruce Morales states his family eats meals together he thinks his family will eat healthier with  him his desired weight loss is 73-83 lbs he has been heavy most of  his life he started gaining weight as a kid, but after his first marriage his heaviest weight ever was 248 lbs he has significant food cravings issues  he snacks frequently in the evenings he skips meals frequently he is frequently drinking liquids with calories he frequently makes poor food choices he has problems with excessive hunger  he frequently eats larger portions than normal  he has binge eating behaviors he struggles with emotional eating    Bruce Morales feels his energy is lower than it should be. This has worsened with weight  gain and has not worsened recently. Bruce Morales admits to daytime somnolence and  admits to waking up still tired. Patient is at risk for obstructive sleep apnea. Patent has a history of symptoms of daytime Bruce. Patient generally gets 5 hours of sleep per night, and states they generally have nightime awakenings. Snoring is present. Apneic episodes are present. Epworth Sleepiness Score is 19.  Dyspnea on exertion Bruce Morales notes increasing shortness of breath with exercising and seems to be worsening over time with weight gain. He notes getting out of breath sooner with activity than he used to. This has not gotten worse recently. Bruce Morales denies orthopnea.  Hyperlipidemia Bruce Morales has hyperlipidemia and has been attempting to improve his cholesterol levels with intensive lifestyle modification including a low saturated fat diet, exercise and weight loss. He is not on statin and denies any chest pain, claudication or myalgias.  At risk for cardiovascular disease Bruce Morales is at a higher than average risk for cardiovascular disease due to obesity and hyperlipidemia. He currently denies any chest pain.  Depression Screen Bruce Morales Food and Mood (modified PHQ-9) score was  Depression screen PHQ 2/9 03/30/2018  Decreased Interest 2  Down, Depressed, Hopeless 1  PHQ - 2 Score 3  Altered sleeping 1  Tired, decreased energy 2  Change in appetite 2  Feeling bad or failure about yourself  0  Trouble concentrating 1  Moving slowly or fidgety/restless 0  Suicidal thoughts 0  PHQ-9 Score 9  Difficult doing work/chores Very difficult    ALLERGIES: Allergies  Allergen Reactions  .  Hydrocodone Hives and Itching  . Oxycodone     Mild Itching, patient can tolerate oxycodone  . Percocet [Oxycodone-Acetaminophen]     Itching, patient says he tolerates    MEDICATIONS: Current Outpatient Medications on File Prior to Visit  Medication Sig Dispense Refill  . cyclobenzaprine (FLEXERIL) 10 MG tablet Take 1 tablet (10  mg total) by mouth as needed. 30 tablet 0  . diltiazem (CARDIZEM) 30 MG tablet Prn rectal pain 20 tablet 0  . diltiazem 2 % GEL Apply a pea-sized amount to the affected area 3 times daily. 30 g 0  . DUEXIS 800-26.6 MG TABS Take 1 tablet by mouth 3 (three) times daily.  0  . NUCYNTA 75 MG tablet TAKE 1 TABLET BY MOUTH EVERY 6 TO 8 HOURS AS NEEDED  0   No current facility-administered medications on file prior to visit.     PAST MEDICAL HISTORY: Past Medical History:  Diagnosis Date  . Anginal pain (HCC) 04/15/2014  . Arthritis    "wrists, knees, lower back" (04/16/2014)  . Carpal tunnel syndrome   . Chronic bronchitis (HCC)    "probably get it q yr"  . Chronic lower back pain   . Chronic lower back pain   . Constipation   . Gall stones   . GERD (gastroesophageal reflux disease)   . GERD (gastroesophageal reflux disease)   . HLD (hyperlipidemia)   . Joint pain   . Meningitis   . Plantar fasciitis   . Right knee pain   . Sciatica     PAST SURGICAL HISTORY: Past Surgical History:  Procedure Laterality Date  . CARPAL TUNNEL RELEASE     02/2018 Right arm  . CHOLECYSTECTOMY  2011  . ESOPHAGOGASTRODUODENOSCOPY N/A 02/27/2013   Procedure: ESOPHAGOGASTRODUODENOSCOPY (EGD);  Surgeon: Florencia Reasons, MD;  Location: [facility];  Service: Endoscopy;  Laterality: N/A;  . KNEE ARTHROSCOPY Right 2013 X 2  . KNEE ARTHROSCOPY Right 02/22/2018  . LAMINECTOMY AND MICRODISCECTOMY SPINE    . LEFT HEART CATHETERIZATION WITH CORONARY ANGIOGRAM N/A 04/17/2014   Procedure: LEFT HEART CATHETERIZATION WITH CORONARY ANGIOGRAM;  Surgeon: Ricki Rodriguez, MD;  Location: [facility];  Service: Cardiovascular;  Laterality: N/A;  . RADIOLOGY WITH ANESTHESIA N/A 06/11/2015   Procedure: MRI LUMBAR WITH AND WITHOUT CONTRAST;  Surgeon: Medication Radiologist, MD;  Location: [facility] OR;  Service: Radiology;  Laterality: N/A;    SOCIAL HISTORY: Social History   Tobacco Use  . Smoking status: Former Smoker     Packs/day: 0.50    Years: 27.00    Pack years: 13.50    Types: Cigarettes    Last attempt to quit: 03/11/2015    Years since quitting: 3.0  . Smokeless tobacco: Former Engineer, water Use Topics  . Alcohol use: Yes    Alcohol/week: 0.0 standard drinks    Comment: 04/16/2014 "a 6 pack will last me a month"  . Drug use: No    Comment: 04/16/2014 "in my teens"    FAMILY HISTORY: Family History  Problem Relation Age of Onset  . Multiple sclerosis Mother   . Obesity Mother   . Heart failure Maternal Grandfather   . Diabetes Father   . Hyperlipidemia Father   . Sleep apnea Father   . Obesity Father   . Fibromyalgia Sister   . Schizophrenia Brother   . Stroke Maternal Grandmother   . Diabetes Paternal Grandmother     ROS: Review of Systems  Constitutional: Positive for malaise/Bruce. Negative for weight  loss.       + Trouble sleeping  HENT:       + Decreased hearing + Nasal stuffiness + Dry mouth  Eyes:       + Wear glasses or contacts  Respiratory: Positive for shortness of breath (with exertion).   Cardiovascular: Negative for chest pain, orthopnea and claudication.  Gastrointestinal: Positive for constipation and heartburn.       + Rectal bleeding  Genitourinary: Positive for frequency.  Musculoskeletal: Positive for back pain. Negative for myalgias.       + Muscle or joint pain + Muscle stiffness  Skin: Positive for itching.  Neurological: Positive for weakness.    PHYSICAL EXAM: Blood pressure 121/74, pulse 75, height 5\' 8"  (1.727 m), weight 248 lb (112.5 kg), SpO2 98 %. Body mass index is 37.71 kg/m. Physical Exam  Constitutional: He is oriented to person, place, and time. He appears well-developed and well-nourished.  HENT:  Head: Normocephalic and atraumatic.  Nose: Nose normal.  Eyes: EOM are normal. No scleral icterus.  Neck: Normal range of motion. Neck supple. No thyromegaly present.  Cardiovascular: Normal rate and regular rhythm.    Pulmonary/Chest: Effort normal. No respiratory distress.  Abdominal: Soft. There is no tenderness.  + Obesity  Musculoskeletal:  Range of Motion normal in all 4 extremities Trace edema noted in bilateral lower extremities  Neurological: He is alert and oriented to person, place, and time. Coordination normal.  Skin: Skin is warm and dry.  Psychiatric: He has a normal mood and affect. His behavior is normal.  Vitals reviewed.   RECENT LABS AND TESTS: BMET    Component Value Date/Time   NA 142 12/08/2017 0944   K 4.6 12/08/2017 0944   CL 104 12/08/2017 0944   CO2 20 12/08/2017 0944   GLUCOSE 96 12/08/2017 0944   GLUCOSE 87 08/14/2015 1550   BUN 17 12/08/2017 0944   CREATININE 1.07 12/08/2017 0944   CREATININE 1.16 08/14/2015 1550   CALCIUM 9.4 12/08/2017 0944   GFRNONAA 87 12/08/2017 0944   GFRNONAA 49 (L) 07/12/2015 0906   GFRAA 101 12/08/2017 0944   GFRAA 57 (L) 07/12/2015 0906   Lab Results  Component Value Date   HGBA1C 5.2 09/13/2014   No results found for: INSULIN CBC    Component Value Date/Time   WBC 5.6 12/08/2017 0944   WBC 5.4 08/14/2015 1550   RBC 5.21 12/08/2017 0944   RBC 3.95 (L) 08/14/2015 1550   HGB 15.1 12/08/2017 0944   HCT 47.0 12/08/2017 0944   PLT 145 (L) 12/08/2017 0944   MCV 90 12/08/2017 0944   MCV 90 07/14/2011 1441   MCH 29.0 12/08/2017 0944   MCH 28.1 08/14/2015 1550   MCHC 32.1 12/08/2017 0944   MCHC 32.4 08/14/2015 1550   RDW 13.5 12/08/2017 0944   RDW 12.8 07/14/2011 1441   LYMPHSABS 2.1 12/08/2017 0944   LYMPHSABS 1.8 07/14/2011 1441   MONOABS 0.4 06/29/2015 0447   MONOABS 0.3 07/14/2011 1441   EOSABS 0.1 12/08/2017 0944   EOSABS 0.1 07/14/2011 1441   BASOSABS 0.0 12/08/2017 0944   BASOSABS 0.0 07/14/2011 1441   Iron/TIBC/Ferritin/ %Sat No results found for: IRON, TIBC, FERRITIN, IRONPCTSAT Lipid Panel     Component Value Date/Time   CHOL 224 (H) 12/08/2017 0944   TRIG 126 12/08/2017 0944   HDL 35 (L) 12/08/2017  0944   CHOLHDL 6.4 (H) 12/08/2017 0944   CHOLHDL 11.7 (H) 03/12/2015 1633   VLDL 75 (H) 03/12/2015 1633  LDLCALC 164 (H) 12/08/2017 0944   Hepatic Function Panel     Component Value Date/Time   PROT 6.8 12/08/2017 0944   ALBUMIN 4.7 12/08/2017 0944   AST 21 12/08/2017 0944   ALT 51 (H) 12/08/2017 0944   ALKPHOS 62 12/08/2017 0944   BILITOT 0.6 12/08/2017 0944   BILIDIR 0.1 04/21/2015 1712   IBILI 0.8 04/21/2015 1712      Component Value Date/Time   TSH 2.120 12/08/2017 0944   TSH 1.053 06/24/2015 1347   TSH 1.105 03/12/2015 1633   TSH 1.449 09/13/2014 1210    ECG  shows NSR with a rate of 68 BPM INDIRECT CALORIMETER done today shows a VO2 of 333 and a REE of 2315.  His calculated basal metabolic rate is 4098 thus his basal metabolic rate is better than expected.    ASSESSMENT AND PLAN: Other Bruce - Plan: EKG 12-Lead, Vitamin B12, CBC With Differential, Comprehensive metabolic panel, Folate, Hemoglobin A1c, Insulin, random, T3, T4, free, TSH, VITAMIN D 25 Hydroxy (Vit-D Deficiency, Fractures)  Shortness of breath on exertion  Other hyperlipidemia - Plan: Lipid Panel With LDL/HDL Ratio  Depression screening  At risk for heart disease  Class 2 severe obesity with serious comorbidity and body mass index (BMI) of 37.0 to 37.9 in adult, unspecified obesity type (HCC)  PLAN:  Bruce Bruce Morales was informed that his Bruce may be related to obesity, depression or many other causes. Labs will be ordered, and in the meanwhile Bruce Morales has agreed to work on diet, exercise and weight loss to help with Bruce. Proper sleep hygiene was discussed including the need for 7-8 hours of quality sleep each night. A sleep study was not ordered based on symptoms and Epworth score.  Dyspnea on exertion Bruce Morales shortness of breath appears to be obesity related and exercise induced. He has agreed to work on weight loss and gradually increase exercise to treat his exercise induced shortness of  breath. If Bruce Morales follows our instructions and loses weight without improvement of his shortness of breath, we will plan to refer to pulmonology. We will monitor this condition regularly. Bruce Morales agrees to this plan.  Hyperlipidemia Bruce Morales was informed of the American Heart Association Guidelines emphasizing intensive lifestyle modifications as the first line treatment for hyperlipidemia. We discussed many lifestyle modifications today in depth, and Lyell will continue to work on decreasing saturated fats such as fatty red meat, butter and many fried foods. He will start diet and will also increase vegetables and lean protein in his diet and continue to work on exercise and weight loss efforts. We will check labs and Bruce Morales agrees to follow up with our clinic in 2 weeks.  Cardiovascular risk counselling Bruce Morales was given extended (15 minutes) coronary artery disease prevention counseling today. He is 42 y.o. male and has risk factors for heart disease including obesity and hyperlipidemia. We discussed intensive lifestyle modifications today with an emphasis on specific weight loss instructions and strategies. Pt was also informed of the importance of increasing exercise and decreasing saturated fats to help prevent heart disease.  Depression Screen Bruce Morales had a mildly positive depression screening. Depression is commonly associated with obesity and often results in emotional eating behaviors. We will monitor this closely and work on CBT to help improve the non-hunger eating patterns. Referral to Psychology may be required if no improvement is seen as he continues in our clinic.  Obesity Bruce Morales is currently in the action stage of change and his goal is to continue with weight  loss efforts. I recommend Bruce Morales begin the structured treatment plan as follows:  He has agreed to follow the Category 3 plan Bruce Morales has been instructed to eventually work up to a goal of 150 minutes of combined cardio and strengthening  exercise per week for weight loss and overall health benefits. We discussed the following Behavioral Modification Strategies today: increasing lean protein intake, decreasing simple carbohydrates , work on meal planning and easy cooking plans and dealing with family or coworker sabotage   He was informed of the importance of frequent follow up visits to maximize his success with intensive lifestyle modifications for his multiple health conditions. He was informed we would discuss his lab results at his next visit unless there is a critical issue that needs to be addressed sooner. Eaden agreed to keep his next visit at the agreed upon time to discuss these results.    OBESITY BEHAVIORAL INTERVENTION VISIT  Today's visit was # 1   Starting weight: 248 lbs Starting date: 03/30/18 Today's weight : 248 lbs  Today's date: 03/30/2018 Total lbs lost to date: 0    ASK: We discussed the diagnosis of obesity with Bruce Morales today and Bruce Morales agreed to give [place] permission to discuss obesity behavioral modification therapy today.  ASSESS: Kordell has the diagnosis of obesity and his BMI today is 37.72 [place]ick is in the action stage of change   ADVISE: Zakariya was educated on the multiple health risks of obesity as well as the benefit of weight loss to improve his health. He was advised of the need for long term treatment and the importance of lifestyle modifications to improve his current health and to decrease his risk of future health problems.  AGREE: Multiple dietary modification options and treatment options were discussed and  Amore agreed to follow the recommendations documented in the above note.  ARRANGE: Rush was educated on the importance of frequent visits to treat obesity as outlined per CMS and USPSTF guidelines and agreed to schedule his next follow up appointment today.  I, Burt Knack, am acting as transcriptionist for Quillian Quince, MD  I have reviewed the above  documentation for accuracy and completeness, and I agree with the above. -Quillian Quince, MD

## 2018-03-31 LAB — CBC WITH DIFFERENTIAL
Basophils Absolute: 0 10*3/uL (ref 0.0–0.2)
Basos: 1 %
EOS (ABSOLUTE): 0.1 10*3/uL (ref 0.0–0.4)
Eos: 2 %
Hematocrit: 40.2 % (ref 37.5–51.0)
Hemoglobin: 13.7 g/dL (ref 13.0–17.7)
Immature Grans (Abs): 0 10*3/uL (ref 0.0–0.1)
Immature Granulocytes: 0 %
Lymphocytes Absolute: 1.8 10*3/uL (ref 0.7–3.1)
Lymphs: 37 %
MCH: 29.7 pg (ref 26.6–33.0)
MCHC: 34.1 g/dL (ref 31.5–35.7)
MCV: 87 fL (ref 79–97)
Monocytes Absolute: 0.5 10*3/uL (ref 0.1–0.9)
Monocytes: 11 %
Neutrophils Absolute: 2.4 10*3/uL (ref 1.4–7.0)
Neutrophils: 49 %
RBC: 4.61 x10E6/uL (ref 4.14–5.80)
RDW: 12.6 % (ref 12.3–15.4)
WBC: 4.8 10*3/uL (ref 3.4–10.8)

## 2018-03-31 LAB — INSULIN, RANDOM: INSULIN: 11.6 u[IU]/mL (ref 2.6–24.9)

## 2018-03-31 LAB — COMPREHENSIVE METABOLIC PANEL
ALT: 47 IU/L — ABNORMAL HIGH (ref 0–44)
AST: 25 IU/L (ref 0–40)
Albumin/Globulin Ratio: 2.2 (ref 1.2–2.2)
Albumin: 4.4 g/dL (ref 3.5–5.5)
Alkaline Phosphatase: 61 IU/L (ref 39–117)
BUN/Creatinine Ratio: 15 (ref 9–20)
BUN: 18 mg/dL (ref 6–24)
Bilirubin Total: 0.8 mg/dL (ref 0.0–1.2)
CO2: 22 mmol/L (ref 20–29)
Calcium: 8.8 mg/dL (ref 8.7–10.2)
Chloride: 102 mmol/L (ref 96–106)
Creatinine, Ser: 1.17 mg/dL (ref 0.76–1.27)
GFR calc Af Amer: 90 mL/min/{1.73_m2} (ref >59)
GFR calc non Af Amer: 78 mL/min/{1.73_m2} (ref >59)
Globulin, Total: 2 g/dL (ref 1.5–4.5)
Glucose: 90 mg/dL (ref 65–99)
Potassium: 4.5 mmol/L (ref 3.5–5.2)
Sodium: 138 mmol/L (ref 134–144)
Total Protein: 6.4 g/dL (ref 6.0–8.5)

## 2018-03-31 LAB — HEMOGLOBIN A1C
Est. average glucose Bld gHb Est-mCnc: 117 mg/dL
Hgb A1c MFr Bld: 5.7 % — ABNORMAL HIGH (ref 4.8–5.6)

## 2018-03-31 LAB — LIPID PANEL WITH LDL/HDL RATIO
Cholesterol, Total: 200 mg/dL — ABNORMAL HIGH (ref 100–199)
HDL: 29 mg/dL — ABNORMAL LOW (ref >39)
LDL Calculated: 142 mg/dL — ABNORMAL HIGH (ref 0–99)
LDl/HDL Ratio: 4.9 ratio — ABNORMAL HIGH (ref 0.0–3.6)
Triglycerides: 146 mg/dL (ref 0–149)
VLDL Cholesterol Cal: 29 mg/dL (ref 5–40)

## 2018-03-31 LAB — T4, FREE: Free T4: 1.07 ng/dL (ref 0.82–1.77)

## 2018-03-31 LAB — TSH: TSH: 3.24 u[IU]/mL (ref 0.450–4.500)

## 2018-03-31 LAB — VITAMIN B12: Vitamin B-12: 910 pg/mL (ref 232–1245)

## 2018-03-31 LAB — VITAMIN D 25 HYDROXY (VIT D DEFICIENCY, FRACTURES): Vit D, 25-Hydroxy: 25.2 ng/mL — ABNORMAL LOW (ref 30.0–100.0)

## 2018-03-31 LAB — T3: T3, Total: 140 ng/dL (ref 71–180)

## 2018-03-31 LAB — FOLATE: Folate: 10.9 ng/mL (ref >3.0)

## 2018-04-04 DIAGNOSIS — M23221 Derangement of posterior horn of medial meniscus due to old tear or injury, right knee: Secondary | ICD-10-CM | POA: Diagnosis not present

## 2018-04-04 DIAGNOSIS — M25661 Stiffness of right knee, not elsewhere classified: Secondary | ICD-10-CM | POA: Diagnosis not present

## 2018-04-06 DIAGNOSIS — M25661 Stiffness of right knee, not elsewhere classified: Secondary | ICD-10-CM | POA: Diagnosis not present

## 2018-04-06 DIAGNOSIS — M23221 Derangement of posterior horn of medial meniscus due to old tear or injury, right knee: Secondary | ICD-10-CM | POA: Diagnosis not present

## 2018-04-10 ENCOUNTER — Other Ambulatory Visit: Payer: Self-pay | Admitting: Physician Assistant

## 2018-04-10 DIAGNOSIS — K594 Anal spasm: Secondary | ICD-10-CM

## 2018-04-10 NOTE — Telephone Encounter (Signed)
Requested medication (s) are due for refill today: Yes  Requested medication (s) are on the active medication list: Yes  Last refill:  03/14/18  Future visit scheduled: No  Notes to clinic:  Unable to refill, med not assigned to protocol     Requested Prescriptions  Pending Prescriptions Disp Refills   diltiazem (CARDIZEM) 30 MG tablet [Pharmacy Med Name: DILTIAZEM 30 MG TABLET] 20 tablet 0    Sig: TAKE 1 TABLET AS NEEDED FOR RECTAL PAIN     Off-Protocol Failed - 04/10/2018  9:28 AM      Failed - Medication not assigned to a protocol, review manually.      Passed - Valid encounter within last 12 months    Recent Outpatient Visits          3 weeks ago Class 2 obesity without serious comorbidity with body mass index (BMI) of 37.0 to 37.9 in adult, unspecified obesity type   Primary Care at [facility], [place] D, PA-C   4 months ago Annual physical exam   Primary Care at [facility], [place] D, PA-C   4 months ago Anal fissure   Primary Care at [facility], [place] D, PA-C   5 months ago Chronic pain syndrome   Primary Care at [facility], Asencion Partridge, MD   11 months ago Chest pain, unspecified type   Primary Care at [facility], [place] D, [place]

## 2018-04-11 DIAGNOSIS — M79641 Pain in right hand: Secondary | ICD-10-CM | POA: Diagnosis not present

## 2018-04-11 DIAGNOSIS — M25661 Stiffness of right knee, not elsewhere classified: Secondary | ICD-10-CM | POA: Diagnosis not present

## 2018-04-11 DIAGNOSIS — M23221 Derangement of posterior horn of medial meniscus due to old tear or injury, right knee: Secondary | ICD-10-CM | POA: Diagnosis not present

## 2018-04-13 DIAGNOSIS — M25661 Stiffness of right knee, not elsewhere classified: Secondary | ICD-10-CM | POA: Diagnosis not present

## 2018-04-13 DIAGNOSIS — M23221 Derangement of posterior horn of medial meniscus due to old tear or injury, right knee: Secondary | ICD-10-CM | POA: Diagnosis not present

## 2018-04-13 NOTE — Telephone Encounter (Signed)
Your patient is requesting medication refill 

## 2018-04-17 ENCOUNTER — Ambulatory Visit (INDEPENDENT_AMBULATORY_CARE_PROVIDER_SITE_OTHER): Payer: Medicare Other | Admitting: Family Medicine

## 2018-04-17 VITALS — BP 118/76 | HR 67 | Temp 97.9°F | Ht 68.0 in | Wt 244.0 lb

## 2018-04-17 DIAGNOSIS — R945 Abnormal results of liver function studies: Secondary | ICD-10-CM | POA: Diagnosis not present

## 2018-04-17 DIAGNOSIS — R7989 Other specified abnormal findings of blood chemistry: Secondary | ICD-10-CM

## 2018-04-17 DIAGNOSIS — M79641 Pain in right hand: Secondary | ICD-10-CM | POA: Diagnosis not present

## 2018-04-17 DIAGNOSIS — E559 Vitamin D deficiency, unspecified: Secondary | ICD-10-CM

## 2018-04-17 DIAGNOSIS — E7849 Other hyperlipidemia: Secondary | ICD-10-CM

## 2018-04-17 DIAGNOSIS — Z6837 Body mass index (BMI) 37.0-37.9, adult: Secondary | ICD-10-CM

## 2018-04-17 DIAGNOSIS — R7303 Prediabetes: Secondary | ICD-10-CM | POA: Diagnosis not present

## 2018-04-17 MED ORDER — METFORMIN HCL 500 MG PO TABS: 500.0000 mg | ORAL_TABLET | Freq: Every day | ORAL | 0 refills | Status: DC

## 2018-04-17 MED ORDER — VITAMIN D (ERGOCALCIFEROL) 1.25 MG (50000 UNIT) PO CAPS: 50000.0000 [IU] | ORAL_CAPSULE | ORAL | 0 refills | Status: DC

## 2018-04-18 ENCOUNTER — Other Ambulatory Visit: Payer: Self-pay | Admitting: Physician Assistant

## 2018-04-18 DIAGNOSIS — M23221 Derangement of posterior horn of medial meniscus due to old tear or injury, right knee: Secondary | ICD-10-CM | POA: Diagnosis not present

## 2018-04-18 DIAGNOSIS — M25661 Stiffness of right knee, not elsewhere classified: Secondary | ICD-10-CM | POA: Diagnosis not present

## 2018-04-18 DIAGNOSIS — M25561 Pain in right knee: Secondary | ICD-10-CM | POA: Diagnosis not present

## 2018-04-19 NOTE — Progress Notes (Signed)
Office: [phone]  /  Fax: [phone]   HPI:   Chief Complaint: OBESITY Bruce Morales is here to discuss his progress with his obesity treatment plan. He is on the Category 3 plan and is following his eating plan approximately 50 % of the time. He states he is walking and bicycling for 15-30 minutes 5-7 times per week. [name] did well with weight loss, but struggled to follow his plan. He didn't do well with meal planning and prepping, and mostly tried to "portion control and make smarter choices". He feels he doesn't want to have to follow a structured plan.  His weight is 244 lb (110.7 kg) today and has had a weight loss of 4 pounds over a period of 2 to 3 weeks since his last visit. He has lost 4 lbs since starting treatment with [place].  Elevated LFTs Fernado has a diagnosis of elevated ALT, slowly improving. He has no history of liver disease, and diagnosis is likely due to non alcoholic fatty liver disease. He denies abdominal pain or jaundice. He denies excessive alcohol intake.  Vitamin D Deficiency [name] has a diagnosis of vitamin D deficiency. He is not on Vit D, he notes fatigue and denies nausea, vomiting or muscle weakness.  Hyperlipidemia (Mixed) [name] has hyperlipidemia, LDL is elevated and HDL is low. He has been attempting to control his cholesterol levels with intensive lifestyle modification including a low saturated fat diet, exercise and weight loss. He denies any chest pain, claudication or myalgias.  Pre-Diabetes [name] has a new diagnosis of pre-diabetes based on his elevated Hgb A1c and was informed this puts him at greater risk of developing diabetes. He has a family history of diabetes mellitus and he notes polyphagia. He is not taking metformin currently and continues to work on diet and exercise to decrease risk of diabetes. He denies nausea or hypoglycemia.  ALLERGIES: Allergies  Allergen Reactions  . Hydrocodone Hives and Itching  . Oxycodone     Mild Itching,  patient can tolerate oxycodone  . Percocet [Oxycodone-Acetaminophen]     Itching, patient says he tolerates    MEDICATIONS: Current Outpatient Medications on File Prior to Visit  Medication Sig Dispense Refill  . cyclobenzaprine (FLEXERIL) 10 MG tablet Take 1 tablet (10 mg total) by mouth as needed. 30 tablet 0  . diltiazem (CARDIZEM) 30 MG tablet TAKE 1 TABLET AS NEEDED FOR RECTAL PAIN 20 tablet 0  . diltiazem 2 % GEL Apply a pea-sized amount to the affected area 3 times daily. 30 g 0  . DUEXIS 800-26.6 MG TABS Take 1 tablet by mouth 3 (three) times daily.  0  . NUCYNTA 75 MG tablet TAKE 1 TABLET BY MOUTH EVERY 6 TO 8 HOURS AS NEEDED  0   No current facility-administered medications on file prior to visit.     PAST MEDICAL HISTORY: Past Medical History:  Diagnosis Date  . Anginal pain (HCC) 04/15/2014  . Arthritis    "wrists, knees, lower back" (04/16/2014)  . Carpal tunnel syndrome   . Chronic bronchitis (HCC)    "probably get it q yr"  . Chronic lower back pain   . Chronic lower back pain   . Constipation   . Gall stones   . GERD (gastroesophageal reflux disease)   . GERD (gastroesophageal reflux disease)   . HLD (hyperlipidemia)   . Joint pain   . Meningitis   . Plantar fasciitis   . Right knee pain   . Sciatica  PAST SURGICAL HISTORY: Past Surgical History:  Procedure Laterality Date  . CARPAL TUNNEL RELEASE     02/2018 Right arm  . CHOLECYSTECTOMY  2011  . ESOPHAGOGASTRODUODENOSCOPY N/A 02/27/2013   Procedure: ESOPHAGOGASTRODUODENOSCOPY (EGD);  Surgeon: Florencia Reasonsobert V Buccini, MD;  [facility];  Service: Endoscopy;  Laterality: N/A;  . KNEE ARTHROSCOPY Right 2013 X 2  . KNEE ARTHROSCOPY Right 02/22/2018  . LAMINECTOMY AND MICRODISCECTOMY SPINE    . LEFT HEART CATHETERIZATION WITH CORONARY ANGIOGRAM N/A 04/17/2014   Procedure: LEFT HEART CATHETERIZATION WITH CORONARY ANGIOGRAM;  Surgeon: Ricki RodriguezAjay S Kadakia, MD;  Location: [facility];  Service:  Cardiovascular;  Laterality: N/A;  . RADIOLOGY WITH ANESTHESIA N/A 06/11/2015   Procedure: MRI LUMBAR WITH AND WITHOUT CONTRAST;  Surgeon: Medication Radiologist, MD;  Location: [facility] OR;  Service: Radiology;  Laterality: N/A;    SOCIAL HISTORY: Social History   Tobacco Use  . Smoking status: Former Smoker    Packs/day: 0.50    Years: 27.00    Pack years: 13.50    Types: Cigarettes    Last attempt to quit: 03/11/2015    Years since quitting: 3.1  . Smokeless tobacco: Former Engineer, waterUser  Substance Use Topics  . Alcohol use: Yes    Alcohol/week: 0.0 standard drinks    Comment: 04/16/2014 "a 6 pack will last me a month"  . Drug use: No    Comment: 04/16/2014 "in my teens"    FAMILY HISTORY: Family History  Problem Relation Age of Onset  . Multiple sclerosis Mother   . Obesity Mother   . Heart failure Maternal Grandfather   . Diabetes Father   . Hyperlipidemia Father   . Sleep apnea Father   . Obesity Father   . Fibromyalgia Sister   . Schizophrenia Brother   . Stroke Maternal Grandmother   . Diabetes Paternal Grandmother     ROS: Review of Systems  Constitutional: Positive for malaise/fatigue and weight loss.  Eyes:       Negative jaundice  Cardiovascular: Negative for chest pain and claudication.  Gastrointestinal: Negative for abdominal pain, nausea and vomiting.  Musculoskeletal: Negative for myalgias.       Negative muscle weakness  Endo/Heme/Allergies:       Negative hypoglycemia Positive polyphagia    PHYSICAL EXAM: Blood pressure 118/76, pulse 67, temperature 97.9 F (36.6 C), temperature source Oral, height 5\' 8"  (1.727 m), weight 244 lb (110.7 kg), SpO2 98 %. Body mass index is 37.1 kg/m. Physical Exam  Constitutional: He is oriented to person, place, and time. He appears well-developed and well-nourished.  Cardiovascular: Normal rate.  Pulmonary/Chest: Effort normal.  Musculoskeletal: Normal range of motion.  Neurological: He is oriented to person, place,  and time.  Skin: Skin is warm and dry.  Psychiatric: He has a normal mood and affect. His behavior is normal.  Vitals reviewed.   RECENT LABS AND TESTS: BMET    Component Value Date/Time   NA 138 03/30/2018 1016   K 4.5 03/30/2018 1016   CL 102 03/30/2018 1016   CO2 22 03/30/2018 1016   GLUCOSE 90 03/30/2018 1016   GLUCOSE 87 08/14/2015 1550   BUN 18 03/30/2018 1016   CREATININE 1.17 03/30/2018 1016   CREATININE 1.16 08/14/2015 1550   CALCIUM 8.8 03/30/2018 1016   GFRNONAA 78 03/30/2018 1016   GFRNONAA 49 (L) 07/12/2015 0906   GFRAA 90 03/30/2018 1016   GFRAA 57 (L) 07/12/2015 0906   Lab Results  Component Value Date   HGBA1C 5.7 (H)  03/30/2018   HGBA1C 5.2 09/13/2014   Lab Results  Component Value Date   INSULIN 11.6 03/30/2018   CBC    Component Value Date/Time   WBC 4.8 03/30/2018 1016   WBC 5.4 08/14/2015 1550   RBC 4.61 03/30/2018 1016   RBC 3.95 (L) 08/14/2015 1550   HGB 13.7 03/30/2018 1016   HCT 40.2 03/30/2018 1016   PLT 145 (L) 12/08/2017 0944   MCV 87 03/30/2018 1016   MCV 90 07/14/2011 1441   MCH 29.7 03/30/2018 1016   MCH 28.1 08/14/2015 1550   MCHC 34.1 03/30/2018 1016   MCHC 32.4 08/14/2015 1550   RDW 12.6 03/30/2018 1016   RDW 12.8 07/14/2011 1441   LYMPHSABS 1.8 03/30/2018 1016   LYMPHSABS 1.8 07/14/2011 1441   MONOABS 0.4 06/29/2015 0447   MONOABS 0.3 07/14/2011 1441   EOSABS 0.1 03/30/2018 1016   EOSABS 0.1 07/14/2011 1441   BASOSABS 0.0 03/30/2018 1016   BASOSABS 0.0 07/14/2011 1441   Iron/TIBC/Ferritin/ %Sat No results found for: IRON, TIBC, FERRITIN, IRONPCTSAT Lipid Panel     Component Value Date/Time   CHOL 200 (H) 03/30/2018 1016   TRIG 146 03/30/2018 1016   HDL 29 (L) 03/30/2018 1016   CHOLHDL 6.4 (H) 12/08/2017 0944   CHOLHDL 11.7 (H) 03/12/2015 1633   VLDL 75 (H) 03/12/2015 1633   LDLCALC 142 (H) 03/30/2018 1016   Hepatic Function Panel     Component Value Date/Time   PROT 6.4 03/30/2018 1016   ALBUMIN 4.4  03/30/2018 1016   AST 25 03/30/2018 1016   ALT 47 (H) 03/30/2018 1016   ALKPHOS 61 03/30/2018 1016   BILITOT 0.8 03/30/2018 1016   BILIDIR 0.1 04/21/2015 1712   IBILI 0.8 04/21/2015 1712      Component Value Date/Time   TSH 3.240 03/30/2018 1016   TSH 2.120 12/08/2017 0944   TSH 1.053 06/24/2015 1347   TSH 1.105 03/12/2015 1633  Results for AGAMJOT, KILGALLON (MRN [id number]) as of 04/19/2018 09:25  Ref. Range 03/30/2018 10:16  Vitamin D, 25-Hydroxy Latest Ref Range: 30.0 - 100.0 ng/mL 25.2 (L)    ASSESSMENT AND PLAN: Elevated liver function tests  Vitamin D deficiency - Plan: Vitamin D, Ergocalciferol, (DRISDOL) 1.25 MG (50000 UT) CAPS capsule  Other hyperlipidemia  Prediabetes - Plan: metFORMIN (GLUCOPHAGE) 500 MG tablet  Class 2 severe obesity with serious comorbidity and body mass index (BMI) of 37.0 to 37.9 in adult, unspecified obesity type (HCC)  PLAN:  Elevated LFTs We discussed the likely diagnosis of non alcoholic fatty liver disease today and how this condition is obesity related. Marley was educated on his risk of developing NASH or even liver failure and th only proven treatment for NAFLD was weight loss. Branko agreed to continue with his weight loss efforts with healthier diet and exercise as an essential part of his treatment plan. Lennex agrees to follow up with our clinic in 2 weeks with Adah Salvage, FNP.  Vitamin D Deficiency Glenn was informed that low vitamin D levels contributes to fatigue and are associated with obesity, breast, and colon cancer. Jamorion agrees to start prescription Vit D @50 ,000 IU every week #4 with no refills. He will follow up for routine testing of vitamin D, at least 2-3 times per year. He was informed of the risk of over-replacement of vitamin D and agrees to not increase his dose unless he discusses this with [place] first. Cordale agrees to follow up with our clinic in 2 weeks with [place],  FNP.  Hyperlipidemia (Mixed) Tome was  informed of the American Heart Association Guidelines emphasizing intensive lifestyle modifications as the first line treatment for hyperlipidemia. We discussed many lifestyle modifications today in depth, and Sayan will continue to work on decreasing saturated fats such as fatty red meat, butter and many fried foods. He will also increase vegetables and lean protein in his diet and continue to work on diet, exercise, and weight loss efforts. We will recheck labs in 3 months. Ahmir agrees to follow up with our clinic in 2 weeks with Adah Salvage, FNP.  Pre-Diabetes Girard will continue to work on weight loss, diet, exercise, and decreasing simple carbohydrates in his diet to help decrease the risk of diabetes. We dicussed metformin including benefits and risks. He was informed that eating too many simple carbohydrates or too many calories at one sitting increases the likelihood of GI side effects. Deddrick agrees to start metformin 500 mg q AM #30 with no refills. Aleczander agrees to follow up with our clinic in 2 weeks with Adah Salvage, FNP as directed to monitor his progress.  Obesity Bryant is currently in the action stage of change. As such, his goal is to continue with weight loss efforts He has agreed to follow the Category 3 plan [place] has been instructed to work up to a goal of 150 minutes of combined cardio and strengthening exercise per week for weight loss and overall health benefits. We discussed the following Behavioral Modification Strategies today: increasing lean protein intake and decreasing simple carbohydrates    Colie has agreed to follow up with our clinic in 2 weeks with Adah Salvage, FNP. He was informed of the importance of frequent follow up visits to maximize his success with intensive lifestyle modifications for his multiple health conditions.   OBESITY BEHAVIORAL INTERVENTION VISIT  Today's visit was # 2   Starting weight: 248 lbs Starting date: 03/30/18 Today's weight :  244 lbs  Today's date: 04/17/2018 Total lbs lost to date: 4 At least 15 minutes were spent on discussing the following behavioral intervention visit.   ASK: We discussed the diagnosis of obesity with [name] today and Masaki agreed to give [place] permission to discuss obesity behavioral modification therapy today.  ASSESS: Jonatan has the diagnosis of obesity and his BMI today is 37.11 Shawna is in the action stage of change   ADVISE: Miner was educated on the multiple health risks of obesity as well as the benefit of weight loss to improve his health. He was advised of the need for long term treatment and the importance of lifestyle modifications to improve his current health and to decrease his risk of future health problems.  AGREE: Multiple dietary modification options and treatment options were discussed and  Deandra agreed to follow the recommendations documented in the above note.  ARRANGE: Ronel was educated on the importance of frequent visits to treat obesity as outlined per CMS and USPSTF guidelines and agreed to schedule his next follow up appointment today.  I, Burt Knack, am acting as transcriptionist for Quillian Quince, MD  I have reviewed the above documentation for accuracy and completeness, and I agree with the above. -Quillian Quince, MD

## 2018-04-20 ENCOUNTER — Telehealth: Payer: Self-pay | Admitting: Physician Assistant

## 2018-04-20 DIAGNOSIS — G894 Chronic pain syndrome: Secondary | ICD-10-CM

## 2018-04-20 NOTE — Telephone Encounter (Signed)
Requested medication (s) are due for refill today: yes  Requested medication (s) are on the active medication list: yes    Last refill: 03/14/18  Future visit scheduled yes 05/02/18  Notes to clinic:not delegated  Requested Prescriptions  Pending Prescriptions Disp Refills   cyclobenzaprine (FLEXERIL) 10 MG tablet [Pharmacy Med Name: CYCLOBENZAPRINE 10 MG TABLET] 30 tablet 0    Sig: Take 1 tablet (10 mg total) by mouth as needed.     Not Delegated - Analgesics:  Muscle Relaxants Failed - 04/20/2018 11:12 AM      Failed - This refill cannot be delegated      Passed - Valid encounter within last 6 months    Recent Outpatient Visits          1 month ago Class 2 obesity without serious comorbidity with body mass index (BMI) of 37.0 to 37.9 in adult, unspecified obesity type   [facility] [place], PA-C   4 months ago Annual physical exam   [facility] [place], PA-C   5 months ago Anal fissure   [facility], [place], PA-C   6 months ago Chronic pain syndrome   [facility], [place] R, [place]   11 months ago Chest pain, unspecified type   [facility], [place], [place]

## 2018-04-24 NOTE — Telephone Encounter (Signed)
Patient called in regarding this refill and would like a call as to why it was denied, patient was a patient of Hal MoralesBrittney Wiseman. Offered patient an appt with another provider to talk about refill and he refused, wants to know if it can be refilled without an office visit.

## 2018-04-25 MED ORDER — CYCLOBENZAPRINE HCL 10 MG PO TABS: 10.0000 mg | ORAL_TABLET | ORAL | 2 refills | Status: DC | PRN

## 2018-04-25 NOTE — Addendum Note (Signed)
Addended by: Myles LippsSANTIAGO, Alek Poncedeleon M on: 04/25/2018 06:07 PM   Modules accepted: Orders

## 2018-05-01 DIAGNOSIS — M79641 Pain in right hand: Secondary | ICD-10-CM | POA: Diagnosis not present

## 2018-05-02 ENCOUNTER — Encounter (INDEPENDENT_AMBULATORY_CARE_PROVIDER_SITE_OTHER): Payer: Self-pay | Admitting: Family Medicine

## 2018-05-02 ENCOUNTER — Ambulatory Visit (INDEPENDENT_AMBULATORY_CARE_PROVIDER_SITE_OTHER): Payer: Medicare HMO | Admitting: Family Medicine

## 2018-05-02 VITALS — BP 120/80 | HR 70 | Temp 98.1°F | Ht 68.0 in | Wt 243.0 lb

## 2018-05-02 DIAGNOSIS — R7303 Prediabetes: Secondary | ICD-10-CM

## 2018-05-02 DIAGNOSIS — Z6837 Body mass index (BMI) 37.0-37.9, adult: Secondary | ICD-10-CM | POA: Diagnosis not present

## 2018-05-02 DIAGNOSIS — E559 Vitamin D deficiency, unspecified: Secondary | ICD-10-CM

## 2018-05-02 MED ORDER — METFORMIN HCL 500 MG PO TABS: 500.0000 mg | ORAL_TABLET | Freq: Two times a day (BID) | ORAL | 0 refills | Status: DC

## 2018-05-02 MED ORDER — VITAMIN D (ERGOCALCIFEROL) 1.25 MG (50000 UNIT) PO CAPS: 50000.0000 [IU] | ORAL_CAPSULE | ORAL | 0 refills | Status: DC

## 2018-05-03 DIAGNOSIS — M23221 Derangement of posterior horn of medial meniscus due to old tear or injury, right knee: Secondary | ICD-10-CM | POA: Diagnosis not present

## 2018-05-03 DIAGNOSIS — M25661 Stiffness of right knee, not elsewhere classified: Secondary | ICD-10-CM | POA: Diagnosis not present

## 2018-05-05 NOTE — Progress Notes (Signed)
Office: [phone]  /  Fax: [phone]   HPI:   Chief Complaint: OBESITY Bruce Morales is here to discuss his progress with his obesity treatment plan. He is on the  follow the Category 3 plan and is following his eating plan approximately 60 % of the time. He states he is exercising 0 minutes 0 times per week. Bruce Morales ate too many sweets over the holidays. They keep a lot of sweets in the house. His wife is [name] and promotes sweets in the house. He is getting off the plan with snacking too much. He is eating large portions and not eating enough protein. He does not feel he is disciplined enough for journaling.   His weight is 243 lb (110.2 kg) today and has had a weight loss of 1 pound over a period of 2 weeks since his last visit. He has lost 5 lbs since starting treatment with [place].  Vitamin D deficiency Bruce Morales has a diagnosis of vitamin D deficiency, not at goal. He is currently taking vit D and denies nausea, vomiting or muscle weakness.  Ref. Range 03/30/2018 10:16  Vitamin D, 25-Hydroxy Latest Ref Range: 30.0 - 100.0 ng/mL 25.2 (L)   Pre-Diabetes Bruce Morales has a diagnosis of prediabetes based on his elevated HgA1c and was informed this puts him at greater risk of developing diabetes. He is taking metformin currently and continues to work on diet and exercise to decrease risk of diabetes. He denies nausea or hypoglycemia.  Lab Results  Component Value Date   HGBA1C 5.7 (H) 03/30/2018    ALLERGIES: Allergies  Allergen Reactions  . Hydrocodone Hives and Itching  . Oxycodone     Mild Itching, patient can tolerate oxycodone  . Percocet [Oxycodone-Acetaminophen]     Itching, patient says he tolerates    MEDICATIONS: Current Outpatient Medications on File Prior to Visit  Medication Sig Dispense Refill  . cyclobenzaprine (FLEXERIL) 10 MG tablet Take 1 tablet (10 mg total) by mouth as needed. 30 tablet 2  . diltiazem (CARDIZEM) 30 MG tablet TAKE 1 TABLET AS NEEDED FOR RECTAL PAIN 20  tablet 0  . diltiazem 2 % GEL Apply a pea-sized amount to the affected area 3 times daily. 30 g 0  . DUEXIS 800-26.6 MG TABS Take 1 tablet by mouth 3 (three) times daily.  0  . NUCYNTA 75 MG tablet TAKE 1 TABLET BY MOUTH EVERY 6 TO 8 HOURS AS NEEDED  0   No current facility-administered medications on file prior to visit.     PAST MEDICAL HISTORY: Past Medical History:  Diagnosis Date  . Anginal pain (HCC) 04/15/2014  . Arthritis    "wrists, knees, lower back" (04/16/2014)  . Carpal tunnel syndrome   . Chronic bronchitis (HCC)    "probably get it q yr"  . Chronic lower back pain   . Chronic lower back pain   . Constipation   . Gall stones   . GERD (gastroesophageal reflux disease)   . GERD (gastroesophageal reflux disease)   . HLD (hyperlipidemia)   . Joint pain   . Meningitis   . Plantar fasciitis   . Right knee pain   . Sciatica     PAST SURGICAL HISTORY: Past Surgical History:  Procedure Laterality Date  . CARPAL TUNNEL RELEASE     02/2018 Right arm  . CHOLECYSTECTOMY  2011  . ESOPHAGOGASTRODUODENOSCOPY N/A 02/27/2013   Procedure: ESOPHAGOGASTRODUODENOSCOPY (EGD);  Surgeon: Florencia Reasons, MD;  Location: [facility];  Service: Endoscopy;  Laterality:  N/A;  . KNEE ARTHROSCOPY Right 2013 X 2  . KNEE ARTHROSCOPY Right 02/22/2018  . LAMINECTOMY AND MICRODISCECTOMY SPINE    . LEFT HEART CATHETERIZATION WITH CORONARY ANGIOGRAM N/A 04/17/2014   Procedure: LEFT HEART CATHETERIZATION WITH CORONARY ANGIOGRAM;  Surgeon: Ricki RodriguezAjay S Kadakia, MD;  Location: [facility];  Service: Cardiovascular;  Laterality: N/A;  . RADIOLOGY WITH ANESTHESIA N/A 06/11/2015   Procedure: MRI LUMBAR WITH AND WITHOUT CONTRAST;  Surgeon: Medication Radiologist, MD;  Location: [facility] OR;  Service: Radiology;  Laterality: N/A;    SOCIAL HISTORY: Social History   Tobacco Use  . Smoking status: Former Smoker    Packs/day: 0.50    Years: 27.00    Pack years: 13.50    Types: Cigarettes    Last attempt to  quit: 03/11/2015    Years since quitting: 3.1  . Smokeless tobacco: Former Engineer, waterUser  Substance Use Topics  . Alcohol use: Yes    Alcohol/week: 0.0 standard drinks    Comment: 04/16/2014 "a 6 pack will last me a month"  . Drug use: No    Comment: 04/16/2014 "in my teens"    FAMILY HISTORY: Family History  Problem Relation Age of Onset  . Multiple sclerosis Mother   . Obesity Mother   . Heart failure Maternal Grandfather   . Diabetes Father   . Hyperlipidemia Father   . Sleep apnea Father   . Obesity Father   . Fibromyalgia Sister   . Schizophrenia Brother   . Stroke Maternal Grandmother   . Diabetes Paternal Grandmother     ROS: Review of Systems  Constitutional: Positive for weight loss.  Gastrointestinal: Negative for nausea and vomiting.  Musculoskeletal:       Negative for muscle weakness  Endo/Heme/Allergies:       Negative for hypoglycemia. Positive for polyphagia.     PHYSICAL EXAM: Blood pressure 120/80, pulse 70, temperature 98.1 F (36.7 C), temperature source Oral, height 5\' 8"  (1.727 m), weight 243 lb (110.2 kg), SpO2 96 %. Body mass index is 36.95 kg/m. Physical Exam  Constitutional: He is oriented to person, place, and time. He appears well-developed and well-nourished.  HENT:  Head: Normocephalic.  Eyes: Pupils are equal, round, and reactive to light.  Neck: Normal range of motion.  Cardiovascular: Normal rate.  Pulmonary/Chest: Effort normal.  Musculoskeletal: Normal range of motion.  Neurological: He is alert and oriented to person, place, and time.  Skin: Skin is warm and dry.  Psychiatric: He has a normal mood and affect. His behavior is normal.  Vitals reviewed.   RECENT LABS AND TESTS: BMET    Component Value Date/Time   NA 138 03/30/2018 1016   K 4.5 03/30/2018 1016   CL 102 03/30/2018 1016   CO2 22 03/30/2018 1016   GLUCOSE 90 03/30/2018 1016   GLUCOSE 87 08/14/2015 1550   BUN 18 03/30/2018 1016   CREATININE 1.17 03/30/2018 1016     CREATININE 1.16 08/14/2015 1550   CALCIUM 8.8 03/30/2018 1016   GFRNONAA 78 03/30/2018 1016   GFRNONAA 49 (L) 07/12/2015 0906   GFRAA 90 03/30/2018 1016   GFRAA 57 (L) 07/12/2015 0906   Lab Results  Component Value Date   HGBA1C 5.7 (H) 03/30/2018   HGBA1C 5.2 09/13/2014   Lab Results  Component Value Date   INSULIN 11.6 03/30/2018   CBC    Component Value Date/Time   WBC 4.8 03/30/2018 1016   WBC 5.4 08/14/2015 1550   RBC 4.61 03/30/2018 1016   RBC  3.95 (L) 08/14/2015 1550   HGB 13.7 03/30/2018 1016   HCT 40.2 03/30/2018 1016   PLT 145 (L) 12/08/2017 0944   MCV 87 03/30/2018 1016   MCV 90 07/14/2011 1441   MCH 29.7 03/30/2018 1016   MCH 28.1 08/14/2015 1550   MCHC 34.1 03/30/2018 1016   MCHC 32.4 08/14/2015 1550   RDW 12.6 03/30/2018 1016   RDW 12.8 07/14/2011 1441   LYMPHSABS 1.8 03/30/2018 1016   LYMPHSABS 1.8 07/14/2011 1441   MONOABS 0.4 06/29/2015 0447   MONOABS 0.3 07/14/2011 1441   EOSABS 0.1 03/30/2018 1016   EOSABS 0.1 07/14/2011 1441   BASOSABS 0.0 03/30/2018 1016   BASOSABS 0.0 07/14/2011 1441   Iron/TIBC/Ferritin/ %Sat No results found for: IRON, TIBC, FERRITIN, IRONPCTSAT Lipid Panel     Component Value Date/Time   CHOL 200 (H) 03/30/2018 1016   TRIG 146 03/30/2018 1016   HDL 29 (L) 03/30/2018 1016   CHOLHDL 6.4 (H) 12/08/2017 0944   CHOLHDL 11.7 (H) 03/12/2015 1633   VLDL 75 (H) 03/12/2015 1633   LDLCALC 142 (H) 03/30/2018 1016   Hepatic Function Panel     Component Value Date/Time   PROT 6.4 03/30/2018 1016   ALBUMIN 4.4 03/30/2018 1016   AST 25 03/30/2018 1016   ALT 47 (H) 03/30/2018 1016   ALKPHOS 61 03/30/2018 1016   BILITOT 0.8 03/30/2018 1016   BILIDIR 0.1 04/21/2015 1712   IBILI 0.8 04/21/2015 1712      Component Value Date/Time   TSH 3.240 03/30/2018 1016   TSH 2.120 12/08/2017 0944   TSH 1.053 06/24/2015 1347   TSH 1.105 03/12/2015 1633    Ref. Range 03/30/2018 10:16  Vitamin D, 25-Hydroxy Latest Ref Range: 30.0 -  100.0 ng/mL 25.2 (L)    ASSESSMENT AND PLAN: Vitamin D deficiency - Plan: Vitamin D, Ergocalciferol, (DRISDOL) 1.25 MG (50000 UT) CAPS capsule  Prediabetes - Plan: metFORMIN (GLUCOPHAGE) 500 MG tablet  Class 2 severe obesity with serious comorbidity and body mass index (BMI) of 37.0 to 37.9 in adult, unspecified obesity type (HCC)  PLAN: Vitamin D Deficiency Khallid was informed that low vitamin D levels contributes to fatigue and are associated with obesity, breast, and colon cancer. He agrees to continue to take prescription Vit D @50 ,000 IU every week #4 with no refills and will follow up for routine testing of vitamin D, at least 2-3 times per year. He was informed of the risk of over-replacement of vitamin D and agrees to not increase his dose unless he discusses this with [place] first. We will repeat vit D level in 2 months.   Pre-Diabetes Chisom will continue to work on weight loss, exercise, and decreasing simple carbohydrates in his diet to help decrease the risk of diabetes. We dicussed metformin including benefits and risks. Aldin will continue metformin for now and we will increase to 500 mg twice daily since he is having sweets cravings.Onalee Hua agreed to follow up with [place] as directed to monitor his progress.  Obesity Aubry is currently in the action stage of change. As such, his goal is to continue with weight loss efforts He has agreed to follow the Category 3 plan  He was provided with a recipe handout today.  Fontaine has been instructed to not exercise yet.  We discussed the following Behavioral Modification Strategies today: increasing lean protein intake, increasing water intake, better snacking choices,  and dealing with family or coworker sabotage.    Kylee has agreed to follow up with  our clinic in 2 weeks. He was informed of the importance of frequent follow up visits to maximize his success with intensive lifestyle modifications for his multiple health conditions.   OBESITY  BEHAVIORAL INTERVENTION VISIT  Today's visit was # 3   Starting weight: 248 lb Starting date: 03/30/18 Today's weight : Weight: 243 lb (110.2 kg)  Today's date: 12/319 Total lbs lost to date: 5 lb At least 15 minutes were spent on discussing the following behavioral intervention visit.   ASK: We discussed the diagnosis of obesity with [name] today and Leanord agreed to give [place] permission to discuss obesity behavioral modification therapy today.  ASSESS: Ruby has the diagnosis of obesity and his BMI today is 36.96 Ruslan is in the action stage of change   ADVISE: Bookert was educated on the multiple health risks of obesity as well as the benefit of weight loss to improve his health. He was advised of the need for long term treatment and the importance of lifestyle modifications to improve his current health and to decrease his risk of future health problems.  AGREE: Multiple dietary modification options and treatment options were discussed and  Belton agreed to follow the recommendations documented in the above note.  ARRANGE: Ashvin was educated on the importance of frequent visits to treat obesity as outlined per CMS and USPSTF guidelines and agreed to schedule his next follow up appointment today.  I, Jeralene Peters, am acting as Energy manager for [place], FNP-C.  I have reviewed the above documentation for accuracy and completeness, and I agree with the above.  - Dawn Whitmire, FNP-C.

## 2018-05-06 ENCOUNTER — Other Ambulatory Visit: Payer: Self-pay | Admitting: Physician Assistant

## 2018-05-06 DIAGNOSIS — K594 Anal spasm: Secondary | ICD-10-CM

## 2018-05-08 ENCOUNTER — Encounter (INDEPENDENT_AMBULATORY_CARE_PROVIDER_SITE_OTHER): Payer: Self-pay | Admitting: Family Medicine

## 2018-05-08 NOTE — Telephone Encounter (Signed)
pls see med request. Pt was given medication for rectal pain.

## 2018-05-08 NOTE — Telephone Encounter (Signed)
Patient called and advised of that he will need to establish with another provider. He says that he's been coming to [place] for 8 years and doesn't understand why he needs to come in to see a provider for refills when there is nothing wrong. He says he was just seen a few months ago and doesn't have the money to keep coming up here to the office. I advised that he will need to establish with another provider and that appointment is for that purpose, so that medications will be refilled under that providers name. He says he understands that, but he has been getting refills for years without having to come in and he says he will expect the same going forward. I advised that his last OV/labs was on 03/30/18, he says to schedule with a new provider for his physical and if he needs to come in before then, he will. Appointment scheduled for Wednesday, 04/04/2019 with Dr. Terence LuxSargardia.

## 2018-05-08 NOTE — Telephone Encounter (Signed)
Requested medication (s) are due for refill today: Yes  Requested medication (s) are on the active medication list: Yes  Last refill:  04/13/18  Future visit scheduled: Yes  Notes to clinic:  Unable to refill per protocol     Requested Prescriptions  Pending Prescriptions Disp Refills   diltiazem (CARDIZEM) 30 MG tablet [Pharmacy Med Name: DILTIAZEM 30 MG TABLET] 20 tablet 0    Sig: TAKE 1 TABLET AS NEEDED FOR RECTAL PAIN     Off-Protocol Failed - 05/08/2018 12:04 PM      Failed - Medication not assigned to a protocol, review manually.      Passed - Valid encounter within last 12 months    Recent Outpatient Visits          1 month ago Class 2 obesity without serious comorbidity with body mass index (BMI) of 37.0 to 37.9 in adult, unspecified obesity type   [facility], [place], PA-C   5 months ago Annual physical exam   [facility] [place] [facility], [place], PA-C   5 months ago Anal fissure   [facility], [place], PA-C   6 months ago Chronic pain syndrome   [facility], Asencion PartridgeJeffrey R, MD   1 year ago Chest pain, unspecified type   [facility], Gerald StabsBrittany D, PA-C      Future Appointments            In 11 months Sagardia, Eilleen KempfMiguel Jose, MD [facility] [place]

## 2018-05-09 ENCOUNTER — Other Ambulatory Visit (INDEPENDENT_AMBULATORY_CARE_PROVIDER_SITE_OTHER): Payer: Self-pay | Admitting: Family Medicine

## 2018-05-09 DIAGNOSIS — R7303 Prediabetes: Secondary | ICD-10-CM

## 2018-05-16 ENCOUNTER — Ambulatory Visit (INDEPENDENT_AMBULATORY_CARE_PROVIDER_SITE_OTHER): Payer: Medicare HMO | Admitting: Family Medicine

## 2018-05-16 ENCOUNTER — Encounter (INDEPENDENT_AMBULATORY_CARE_PROVIDER_SITE_OTHER): Payer: Self-pay | Admitting: Family Medicine

## 2018-05-16 VITALS — BP 125/79 | HR 66 | Temp 97.8°F | Ht 68.0 in | Wt 245.0 lb

## 2018-05-16 DIAGNOSIS — Z9189 Other specified personal risk factors, not elsewhere classified: Secondary | ICD-10-CM

## 2018-05-16 DIAGNOSIS — E559 Vitamin D deficiency, unspecified: Secondary | ICD-10-CM

## 2018-05-16 DIAGNOSIS — R7303 Prediabetes: Secondary | ICD-10-CM | POA: Diagnosis not present

## 2018-05-16 DIAGNOSIS — Z6837 Body mass index (BMI) 37.0-37.9, adult: Secondary | ICD-10-CM

## 2018-05-16 MED ORDER — VITAMIN D (ERGOCALCIFEROL) 1.25 MG (50000 UNIT) PO CAPS: 50000.0000 [IU] | ORAL_CAPSULE | ORAL | 0 refills | Status: DC

## 2018-05-17 ENCOUNTER — Encounter (INDEPENDENT_AMBULATORY_CARE_PROVIDER_SITE_OTHER): Payer: Self-pay | Admitting: Family Medicine

## 2018-05-17 DIAGNOSIS — E559 Vitamin D deficiency, unspecified: Secondary | ICD-10-CM | POA: Insufficient documentation

## 2018-05-17 DIAGNOSIS — R7303 Prediabetes: Secondary | ICD-10-CM | POA: Insufficient documentation

## 2018-05-17 NOTE — Progress Notes (Signed)
Office: [phone]  /  Fax: [phone]   HPI:   Chief Complaint: OBESITY Bruce Morales is here to discuss his progress with his obesity treatment plan. He is on the Category 3 plan and is following his eating plan approximately 75-80% of the time. He states he is exercising 0 minutes 0 times per week. Bruce Morales is struggling with chronic back pain. He is out of pain medications currently that he takes for chronic back pain. He has been indulging recently on foods that are off the plan. His weight is 245 lb (111.1 kg) today and has gained 2 pounds since his last visit. He has lost 3 lbs since starting treatment with us.  Pre-Diabetes Bruce Morales has a diagnosis of pre-diabetes based on his elevated Hgb A1c at 5.7 and was informed this puts him at greater risk of developing diabetes. He denies nausea, vomiting, or diarrhea, and notes metformin is causing headaches, irritability, and dry mouth. He continues to work on diet to decrease risk of diabetes. He denies hypoglycemia.  At risk for diabetes Bruce Morales is at higher than average risk for developing diabetes due to his obesity and pre-diabetes. He currently denies polyuria or polydipsia.  Vitamin D Deficiency Bruce Morales has a diagnosis of vitamin D deficiency. He is currently taking prescription Vit D, but level is not at goal. Last Vit D was 25.2 on 03/30/18. He denies nausea, vomiting or muscle weakness.  ALLERGIES: Allergies  Allergen Reactions  . Hydrocodone Hives and Itching  . Oxycodone     Mild Itching, patient can tolerate oxycodone  . Percocet [Oxycodone-Acetaminophen]     Itching, patient says he tolerates    MEDICATIONS: Current Outpatient Medications on File Prior to Visit  Medication Sig Dispense Refill  . cyclobenzaprine (FLEXERIL) 10 MG tablet Take 1 tablet (10 mg total) by mouth as needed. 30 tablet 2  . diltiazem (CARDIZEM) 30 MG tablet TAKE 1 TABLET AS NEEDED FOR RECTAL PAIN 20 tablet 0  . diltiazem 2 % GEL Apply a pea-sized amount  to the affected area 3 times daily. 30 g 0  . DUEXIS 800-26.6 MG TABS Take 1 tablet by mouth 3 (three) times daily.  0  . metFORMIN (GLUCOPHAGE) 500 MG tablet Take 1 tablet (500 mg total) by mouth 2 (two) times daily with a meal. 60 tablet 0  . NUCYNTA 75 MG tablet TAKE 1 TABLET BY MOUTH EVERY 6 TO 8 HOURS AS NEEDED  0   No current facility-administered medications on file prior to visit.     PAST MEDICAL HISTORY: Past Medical History:  Diagnosis Date  . Anginal pain (HCC) 04/15/2014  . Arthritis    "wrists, knees, lower back" (04/16/2014)  . Carpal tunnel syndrome   . Chronic bronchitis (HCC)    "probably get it q yr"  . Chronic lower back pain   . Chronic lower back pain   . Constipation   . Gall stones   . GERD (gastroesophageal reflux disease)   . GERD (gastroesophageal reflux disease)   . HLD (hyperlipidemia)   . Joint pain   . Meningitis   . Plantar fasciitis   . Right knee pain   . Sciatica     PAST SURGICAL HISTORY: Past Surgical History:  Procedure Laterality Date  . CARPAL TUNNEL RELEASE     02/2018 Right arm  . CHOLECYSTECTOMY  2011  . ESOPHAGOGASTRODUODENOSCOPY N/A 02/27/2013   Procedure: ESOPHAGOGASTRODUODENOSCOPY (EGD);  Surgeon: Florencia Reasonsobert V Buccini, MD;  Location: [facility];  Service: Endoscopy;  Laterality: N/A;  .  KNEE ARTHROSCOPY Right 2013 X 2  . KNEE ARTHROSCOPY Right 02/22/2018  . LAMINECTOMY AND MICRODISCECTOMY SPINE    . LEFT HEART CATHETERIZATION WITH CORONARY ANGIOGRAM N/A 04/17/2014   Procedure: LEFT HEART CATHETERIZATION WITH CORONARY ANGIOGRAM;  Surgeon: Ricki Rodriguez, MD;  Location: [facility];  Service: Cardiovascular;  Laterality: N/A;  . RADIOLOGY WITH ANESTHESIA N/A 06/11/2015   Procedure: MRI LUMBAR WITH AND WITHOUT CONTRAST;  Surgeon: Medication Radiologist, MD;  Location: [facility];  Service: Radiology;  Laterality: N/A;    SOCIAL HISTORY: Social History   Tobacco Use  . Smoking status: Former Smoker    Packs/day: 0.50    Years:  27.00    Pack years: 13.50    Types: Cigarettes    Last attempt to quit: 03/11/2015    Years since quitting: 3.1  . Smokeless tobacco: Former Engineer, water Use Topics  . Alcohol use: Yes    Alcohol/week: 0.0 standard drinks    Comment: 04/16/2014 "a 6 pack will last me a month"  . Drug use: No    Comment: 04/16/2014 "in my teens"    FAMILY HISTORY: Family History  Problem Relation Age of Onset  . Multiple sclerosis Mother   . Obesity Mother   . Heart failure Maternal Grandfather   . Diabetes Father   . Hyperlipidemia Father   . Sleep apnea Father   . Obesity Father   . Fibromyalgia Sister   . Schizophrenia Brother   . Stroke Maternal Grandmother   . Diabetes Paternal Grandmother     ROS: Review of Systems  Constitutional: Negative for weight loss.  HENT:       + Dry mouth  Gastrointestinal: Negative for diarrhea, nausea and vomiting.  Genitourinary: Negative for frequency.  Musculoskeletal: Positive for back pain.       Negative muscle weakness  Neurological: Positive for headaches.  Endo/Heme/Allergies: Negative for polydipsia.       Negative hypoglycemia    PHYSICAL EXAM: Blood pressure 125/79, pulse 66, temperature 97.8 F (36.6 C), temperature source Oral, height 5\' 8"  (1.727 m), weight 245 lb (111.1 kg), SpO2 96 %. Body mass index is 37.25 kg/m. Physical Exam Vitals signs reviewed.  Constitutional:      Appearance: Normal appearance. He is obese.  Cardiovascular:     Rate and Rhythm: Normal rate.  Pulmonary:     Effort: Pulmonary effort is normal.  Musculoskeletal: Normal range of motion.  Skin:    General: Skin is warm and dry.  Neurological:     Mental Status: He is alert and oriented to person, place, and time.  Psychiatric:        Mood and Affect: Mood normal.        Behavior: Behavior normal.     RECENT LABS AND TESTS: BMET    Component Value Date/Time   NA 138 03/30/2018 1016   K 4.5 03/30/2018 1016   CL 102 03/30/2018 1016   CO2  22 03/30/2018 1016   GLUCOSE 90 03/30/2018 1016   GLUCOSE 87 08/14/2015 1550   BUN 18 03/30/2018 1016   CREATININE 1.17 03/30/2018 1016   CREATININE 1.16 08/14/2015 1550   CALCIUM 8.8 03/30/2018 1016   GFRNONAA 78 03/30/2018 1016   GFRNONAA 49 (L) 07/12/2015 0906   GFRAA 90 03/30/2018 1016   GFRAA 57 (L) 07/12/2015 0906   Lab Results  Component Value Date   HGBA1C 5.7 (H) 03/30/2018   HGBA1C 5.2 09/13/2014   Lab Results  Component Value Date  INSULIN 11.6 03/30/2018   CBC    Component Value Date/Time   WBC 4.8 03/30/2018 1016   WBC 5.4 08/14/2015 1550   RBC 4.61 03/30/2018 1016   RBC 3.95 (L) 08/14/2015 1550   HGB 13.7 03/30/2018 1016   HCT 40.2 03/30/2018 1016   PLT 145 (L) 12/08/2017 0944   MCV 87 03/30/2018 1016   MCV 90 07/14/2011 1441   MCH 29.7 03/30/2018 1016   MCH 28.1 08/14/2015 1550   MCHC 34.1 03/30/2018 1016   MCHC 32.4 08/14/2015 1550   RDW 12.6 03/30/2018 1016   RDW 12.8 07/14/2011 1441   LYMPHSABS 1.8 03/30/2018 1016   LYMPHSABS 1.8 07/14/2011 1441   MONOABS 0.4 06/29/2015 0447   MONOABS 0.3 07/14/2011 1441   EOSABS 0.1 03/30/2018 1016   EOSABS 0.1 07/14/2011 1441   BASOSABS 0.0 03/30/2018 1016   BASOSABS 0.0 07/14/2011 1441   Iron/TIBC/Ferritin/ %Sat No results found for: IRON, TIBC, FERRITIN, IRONPCTSAT Lipid Panel     Component Value Date/Time   CHOL 200 (H) 03/30/2018 1016   TRIG 146 03/30/2018 1016   HDL 29 (L) 03/30/2018 1016   CHOLHDL 6.4 (H) 12/08/2017 0944   CHOLHDL 11.7 (H) 03/12/2015 1633   VLDL 75 (H) 03/12/2015 1633   LDLCALC 142 (H) 03/30/2018 1016   Hepatic Function Panel     Component Value Date/Time   PROT 6.4 03/30/2018 1016   ALBUMIN 4.4 03/30/2018 1016   AST 25 03/30/2018 1016   ALT 47 (H) 03/30/2018 1016   ALKPHOS 61 03/30/2018 1016   BILITOT 0.8 03/30/2018 1016   BILIDIR 0.1 04/21/2015 1712   IBILI 0.8 04/21/2015 1712      Component Value Date/Time   TSH 3.240 03/30/2018 1016   TSH 2.120 12/08/2017 0944     TSH 1.053 06/24/2015 1347   TSH 1.105 03/12/2015 1633    ASSESSMENT AND PLAN: Prediabetes  Vitamin D deficiency - Plan: Vitamin D, Ergocalciferol, (DRISDOL) 1.25 MG (50000 UT) CAPS capsule  At risk for diabetes mellitus  Class 2 severe obesity with serious comorbidity and body mass index (BMI) of 37.0 to 37.9 in adult, unspecified obesity type (HCC)  PLAN:  Pre-Diabetes Bruce Morales will continue to work on weight loss and decreasing simple carbohydrates in his diet to help decrease the risk of diabetes. Bruce Hua agrees to discontinue metformin because of side effects and we will recheck A1c in 6 weeks. Bruce Morales agrees to follow up with our clinic in 2 to 3 weeks as directed to monitor his progress.  Diabetes risk counselling Bruce Morales was given extended (15 minutes) diabetes prevention counseling today. He is 42 y.o. male and has risk factors for diabetes including obesity and pre-diabetes. We discussed intensive lifestyle modifications today with an emphasis on weight loss as well as increasing exercise and decreasing simple carbohydrates in his diet.  Vitamin D Deficiency Bruce Morales was informed that low vitamin D levels contributes to fatigue and are associated with obesity, breast, and colon cancer. Bruce Morales agrees to continue taking prescription Vit D @50 ,000 IU every week #4 and we will refill for 1 month. He will follow up for routine testing of vitamin D, at least 2-3 times per year. He was informed of the risk of over-replacement of vitamin D and agrees to not increase his dose unless he discusses this with [place] first. We will recheck level in 6 weeks. Bruce Morales agrees to follow up with our clinic in 2 to 3 weeks.  Obesity Bruce Morales is currently in the action stage of change.  As such, his goal is to continue with weight loss efforts He has agreed to follow the Category 3 plan Bruce Morales has not been prescribed exercise at this time. We discussed the following Behavioral Modification Strategies today: holiday  eating strategies, emotional eating strategies, and planning for success   Bruce Morales has agreed to follow up with our clinic in 2 to 3 weeks. He was informed of the importance of frequent follow up visits to maximize his success with intensive lifestyle modifications for his multiple health conditions.   OBESITY BEHAVIORAL INTERVENTION VISIT  Today's visit was # 4  Starting weight: 248 lbs Starting date: 03/30/18 Today's weight : 245 lbs  Today's date: 05/16/2018 Total lbs lost to date: 3    ASK: We discussed the diagnosis of obesity with Bruce Morales today and Bruce Hua agreed to give [place] permission to discuss obesity behavioral modification therapy today.  ASSESS: Bruce Morales has the diagnosis of obesity and his BMI today is 37.26 Bruce Morales is in the action stage of change   ADVISE: Bruce Morales was educated on the multiple health risks of obesity as well as the benefit of weight loss to improve his health. He was advised of the need for long term treatment and the importance of lifestyle modifications to improve his current health and to decrease his risk of future health problems.  AGREE: Multiple dietary modification options and treatment options were discussed and  Bruce Morales agreed to follow the recommendations documented in the above note.  ARRANGE: Bruce Morales was educated on the importance of frequent visits to treat obesity as outlined per CMS and USPSTF guidelines and agreed to schedule his next follow up appointment today.  Trude Mcburney, am acting as Energy manager for [place], FNP-C.  I have reviewed the above documentation for accuracy and completeness, and I agree with the above.  -  , FNP-C.

## 2018-05-30 DIAGNOSIS — M25561 Pain in right knee: Secondary | ICD-10-CM | POA: Diagnosis not present

## 2018-06-08 ENCOUNTER — Ambulatory Visit (INDEPENDENT_AMBULATORY_CARE_PROVIDER_SITE_OTHER): Payer: Medicare HMO | Admitting: Family Medicine

## 2018-06-08 ENCOUNTER — Encounter (INDEPENDENT_AMBULATORY_CARE_PROVIDER_SITE_OTHER): Payer: Self-pay | Admitting: Family Medicine

## 2018-06-08 VITALS — BP 107/71 | HR 68 | Temp 97.7°F | Ht 68.0 in | Wt 246.0 lb

## 2018-06-08 DIAGNOSIS — Z6837 Body mass index (BMI) 37.0-37.9, adult: Secondary | ICD-10-CM | POA: Diagnosis not present

## 2018-06-08 DIAGNOSIS — R7303 Prediabetes: Secondary | ICD-10-CM | POA: Diagnosis not present

## 2018-06-08 DIAGNOSIS — E559 Vitamin D deficiency, unspecified: Secondary | ICD-10-CM | POA: Diagnosis not present

## 2018-06-08 MED ORDER — VITAMIN D (ERGOCALCIFEROL) 1.25 MG (50000 UNIT) PO CAPS: 50000.0000 [IU] | ORAL_CAPSULE | ORAL | 0 refills | Status: DC

## 2018-06-09 ENCOUNTER — Other Ambulatory Visit: Payer: Self-pay | Admitting: Family Medicine

## 2018-06-09 DIAGNOSIS — K594 Anal spasm: Secondary | ICD-10-CM

## 2018-06-10 NOTE — Progress Notes (Signed)
Office: [phone]  /  Fax: [phone]   HPI:   Chief Complaint: Bruce Morales Bruce Bruce Morales is here to discuss his progress with his Bruce Morales treatment plan. He is on the Category 3 plan and is following his eating plan approximately 20% of the time. He states he is exercising 0 minutes 0 times per week. Bruce Bruce Morales has been off track over the holidays, but was cognizant of choices and tried to make healthy choices. He needs to shop for food for the plan to get back on the plan.  His weight is 246 lb (111.6 kg) today and has gained 1 pound since his last visit. He has lost 2 lbs since starting treatment with [place].  Vitamin D Deficiency Bruce Bruce Morales has a diagnosis of vitamin D deficiency. He is currently taking prescription Vit D, but level is not at goal. Last Vit D was 25.2 on 03/30/18. He denies nausea, vomiting or muscle weakness.  Pre-Diabetes Bruce Bruce Morales has a diagnosis of pre-diabetes based on his elevated Hgb A1c at 5.7 on 03/30/18, and was informed this puts him at greater risk of developing diabetes. He is taking metformin currently and continues to work on diet and exercise to decrease risk of diabetes. He denies polyphagia or hypoglycemia.  ALLERGIES: Allergies  Allergen Reactions  . Hydrocodone Hives and Itching  . Oxycodone     Mild Itching, patient can tolerate oxycodone  . Percocet [Oxycodone-Acetaminophen]     Itching, patient says he tolerates    MEDICATIONS: Current Outpatient Medications on File Prior to Visit  Medication Sig Dispense Refill  . cyclobenzaprine (FLEXERIL) 10 MG tablet Take 1 tablet (10 mg total) by mouth as needed. 30 tablet 2  . diltiazem 2 % GEL Apply a pea-sized amount to the affected area 3 times daily. 30 g 0  . DUEXIS 800-26.6 MG TABS Take 1 tablet by mouth 3 (three) times daily.  0  . metFORMIN (GLUCOPHAGE) 500 MG tablet Take 1 tablet (500 mg total) by mouth 2 (two) times daily with a meal. 60 tablet 0  . NUCYNTA 75 MG tablet TAKE 1 TABLET BY MOUTH EVERY 6 TO 8 HOURS  AS NEEDED  0   No current facility-administered medications on file prior to visit.     PAST MEDICAL HISTORY: Past Medical History:  Diagnosis Date  . Anginal pain (HCC) 04/15/2014  . Arthritis    "wrists, knees, lower back" (04/16/2014)  . Carpal tunnel syndrome   . Chronic bronchitis (HCC)    "probably get it q yr"  . Chronic lower back pain   . Chronic lower back pain   . Constipation   . Gall stones   . GERD (gastroesophageal reflux disease)   . GERD (gastroesophageal reflux disease)   . HLD (hyperlipidemia)   . Joint pain   . Meningitis   . Plantar fasciitis   . Right knee pain   . Sciatica     PAST SURGICAL HISTORY: Past Surgical History:  Procedure Laterality Date  . CARPAL TUNNEL RELEASE     02/2018 Right arm  . CHOLECYSTECTOMY  2011  . ESOPHAGOGASTRODUODENOSCOPY N/A 02/27/2013   Procedure: ESOPHAGOGASTRODUODENOSCOPY (EGD);  Surgeon: Florencia Reasons, MD;  Location: [facility];  Service: Endoscopy;  Laterality: N/A;  . KNEE ARTHROSCOPY Right 2013 X 2  . KNEE ARTHROSCOPY Right 02/22/2018  . LAMINECTOMY AND MICRODISCECTOMY SPINE    . LEFT HEART CATHETERIZATION WITH CORONARY ANGIOGRAM N/A 04/17/2014   Procedure: LEFT HEART CATHETERIZATION WITH CORONARY ANGIOGRAM;  Surgeon: Ricki Rodriguez, MD;  Location:  [facility];  Service: Cardiovascular;  Laterality: N/A;  . RADIOLOGY WITH ANESTHESIA N/A 06/11/2015   Procedure: MRI LUMBAR WITH AND WITHOUT CONTRAST;  Surgeon: Medication Radiologist, MD;  Location: [facility];  Service: Radiology;  Laterality: N/A;    SOCIAL HISTORY: Social History   Tobacco Use  . Smoking status: Former Smoker    Packs/day: 0.50    Years: 27.00    Pack years: 13.50    Types: Cigarettes    Last attempt to quit: 03/11/2015    Years since quitting: 3.2  . Smokeless tobacco: Former Engineer, waterUser  Substance Use Topics  . Alcohol use: Yes    Alcohol/week: 0.0 standard drinks    Comment: 04/16/2014 "a 6 pack will last me a month"  . Drug use: No     Comment: 04/16/2014 "in my teens"    FAMILY HISTORY: Family History  Problem Relation Age of Onset  . Multiple sclerosis Mother   . Bruce Morales Mother   . Heart failure Maternal Grandfather   . Diabetes Father   . Hyperlipidemia Father   . Sleep apnea Father   . Bruce Morales Father   . Fibromyalgia Sister   . Schizophrenia Brother   . Stroke Maternal Grandmother   . Diabetes Paternal Grandmother     ROS: Review of Systems  Constitutional: Negative for weight loss.  Gastrointestinal: Negative for nausea and vomiting.  Musculoskeletal:       Negative muscle weakness  Endo/Heme/Allergies:       Negative polyphagia Negative hypoglycemia    PHYSICAL EXAM: Blood pressure 107/71, pulse 68, temperature 97.7 F (36.5 C), temperature source Oral, height 5\' 8"  (1.727 m), weight 246 lb (111.6 kg), SpO2 97 %. Body mass index is 37.4 kg/m. Physical Exam Vitals signs reviewed.  Constitutional:      Appearance: Normal appearance. He is obese.  Cardiovascular:     Rate and Rhythm: Normal rate.     Pulses: Normal pulses.  Pulmonary:     Effort: Pulmonary effort is normal.     Breath sounds: Normal breath sounds.  Musculoskeletal: Normal range of motion.  Skin:    General: Skin is warm and dry.  Neurological:     Mental Status: He is alert and oriented to person, place, and time.  Psychiatric:        Mood and Affect: Mood normal.        Behavior: Behavior normal.     RECENT LABS AND TESTS: BMET    Component Value Date/Time   NA 138 03/30/2018 1016   K 4.5 03/30/2018 1016   CL 102 03/30/2018 1016   CO2 22 03/30/2018 1016   GLUCOSE 90 03/30/2018 1016   GLUCOSE 87 08/14/2015 1550   BUN 18 03/30/2018 1016   CREATININE 1.17 03/30/2018 1016   CREATININE 1.16 08/14/2015 1550   CALCIUM 8.8 03/30/2018 1016   GFRNONAA 78 03/30/2018 1016   GFRNONAA 49 (L) 07/12/2015 0906   GFRAA 90 03/30/2018 1016   GFRAA 57 (L) 07/12/2015 0906   Lab Results  Component Value Date   HGBA1C 5.7  (H) 03/30/2018   HGBA1C 5.2 09/13/2014   Lab Results  Component Value Date   INSULIN 11.6 03/30/2018   CBC    Component Value Date/Time   WBC 4.8 03/30/2018 1016   WBC 5.4 08/14/2015 1550   RBC 4.61 03/30/2018 1016   RBC 3.95 (L) 08/14/2015 1550   HGB 13.7 03/30/2018 1016   HCT 40.2 03/30/2018 1016   PLT 145 (L) 12/08/2017 [id number]  MCV 87 03/30/2018 1016   MCV 90 07/14/2011 1441   MCH 29.7 03/30/2018 1016   MCH 28.1 08/14/2015 1550   MCHC 34.1 03/30/2018 1016   MCHC 32.4 08/14/2015 1550   RDW 12.6 03/30/2018 1016   RDW 12.8 07/14/2011 1441   LYMPHSABS 1.8 03/30/2018 1016   LYMPHSABS 1.8 07/14/2011 1441   MONOABS 0.4 06/29/2015 0447   MONOABS 0.3 07/14/2011 1441   EOSABS 0.1 03/30/2018 1016   EOSABS 0.1 07/14/2011 1441   BASOSABS 0.0 03/30/2018 1016   BASOSABS 0.0 07/14/2011 1441   Iron/TIBC/Ferritin/ %Sat No results found for: IRON, TIBC, FERRITIN, IRONPCTSAT Lipid Panel     Component Value Date/Time   CHOL 200 (H) 03/30/2018 1016   TRIG 146 03/30/2018 1016   HDL 29 (L) 03/30/2018 1016   CHOLHDL 6.4 (H) 12/08/2017 0944   CHOLHDL 11.7 (H) 03/12/2015 1633   VLDL 75 (H) 03/12/2015 1633   LDLCALC 142 (H) 03/30/2018 1016   Hepatic Function Panel     Component Value Date/Time   PROT 6.4 03/30/2018 1016   ALBUMIN 4.4 03/30/2018 1016   AST 25 03/30/2018 1016   ALT 47 (H) 03/30/2018 1016   ALKPHOS 61 03/30/2018 1016   BILITOT 0.8 03/30/2018 1016   BILIDIR 0.1 04/21/2015 1712   IBILI 0.8 04/21/2015 1712      Component Value Date/Time   TSH 3.240 03/30/2018 1016   TSH 2.120 12/08/2017 0944   TSH 1.053 06/24/2015 1347   TSH 1.105 03/12/2015 1633    ASSESSMENT AND PLAN: Vitamin D deficiency - Plan: Vitamin D, Ergocalciferol, (DRISDOL) 1.25 MG (50000 UT) CAPS capsule  Prediabetes  Bruce Bruce Morales with serious comorbidity and body mass index (BMI) of 37.0 to 37.9 in adult, unspecified Bruce Morales type (HCC)  PLAN:  Vitamin D Deficiency Bruce Bruce Morales was informed  that low vitamin D levels contributes to fatigue and are associated with Bruce Morales, breast, and colon cancer. Bruce Bruce Morales agrees to continue taking prescription Vit D @50 ,000 IU every week #4 and we will refill for 1 month. He will follow up for routine testing of vitamin D, at least 2-3 times per year. He was informed of the risk of over-replacement of vitamin D and agrees to not increase his dose unless he discusses this with [place] first. Bruce Bruce Morales agrees to follow up with our clinic in 2 weeks.  Pre-Diabetes Bruce Bruce Morales will continue to work on weight loss, exercise, and decreasing simple carbohydrates in his diet to help decrease the risk of diabetes. We dicussed metformin including benefits and risks. He was informed that eating too many simple carbohydrates or too many calories at one sitting increases the likelihood of GI side effects. Jaaron agrees to continue taking metformin, and he agrees to follow up with our clinic in 2 weeks as directed to monitor his progress.  Bruce Morales Bruce Bruce Morales is currently in the action stage of change. As such, his goal is to continue with weight loss efforts He has agreed to follow the Category 3 plan with breakfast options Bruce Bruce Morales has not been prescribed exercise at this time.  We discussed the following Behavioral Modification Strategies today: work on meal planning and easy cooking plans, keeping healthy foods in the home, and planning for success   Bruce Bruce Morales has agreed to follow up with our clinic in 2 weeks. He was informed of the importance of frequent follow up visits to maximize his success with intensive lifestyle modifications for his multiple health conditions.   Bruce Morales BEHAVIORAL INTERVENTION VISIT  Today's visit was # 5  Starting weight: 248 Starting date: 03/30/18 Today's weight : 246 lbs  Today's date: 06/08/2018 Total lbs lost to date: 2 At least 15 minutes were spent on discussing the following behavioral intervention visit.   ASK: We discussed the diagnosis of  Bruce Morales with Bruce Bruce Morales today and Bruce Bruce Morales agreed to give us permission to discuss Bruce Morales behavioral modification therapy today.  ASSESS: Bruce Bruce Morales has the diagnosis of Bruce Morales and his BMI today is 37.41 Bruce Bruce Morales is in the action stage of change   ADVISE: Bruce Bruce Morales was educated on the multiple health risks of Bruce Morales as well as the benefit of weight loss to improve his health. He was advised of the need for long term treatment and the importance of lifestyle modifications to improve his current health and to decrease his risk of future health problems.  AGREE: Multiple dietary modification options and treatment options were discussed and  Bruce Bruce Morales agreed to follow the recommendations documented in the above note.  ARRANGE: Bruce Bruce Morales was educated on the importance of frequent visits to treat Bruce Morales as outlined per CMS and USPSTF guidelines and agreed to schedule his next follow up appointment today.  Trude McburneyI, Sharon Martin, am acting as Energy managertranscriptionist for [place] Sume[place] Geter, FNP-C.  I have reviewed the above documentation for accuracy and completeness, and I agree with the above.  - Donte Kary, FNP-C.

## 2018-06-14 DIAGNOSIS — M961 Postlaminectomy syndrome, not elsewhere classified: Secondary | ICD-10-CM | POA: Diagnosis not present

## 2018-06-15 ENCOUNTER — Encounter (INDEPENDENT_AMBULATORY_CARE_PROVIDER_SITE_OTHER): Payer: Self-pay | Admitting: Family Medicine

## 2018-06-20 ENCOUNTER — Encounter: Payer: Self-pay | Admitting: Emergency Medicine

## 2018-06-20 ENCOUNTER — Ambulatory Visit: Payer: Self-pay

## 2018-06-20 ENCOUNTER — Other Ambulatory Visit: Payer: Self-pay

## 2018-06-20 ENCOUNTER — Ambulatory Visit (INDEPENDENT_AMBULATORY_CARE_PROVIDER_SITE_OTHER): Payer: Medicare HMO | Admitting: Emergency Medicine

## 2018-06-20 VITALS — BP 121/74 | HR 69 | Temp 98.5°F | Resp 16 | Ht 68.0 in | Wt 255.0 lb

## 2018-06-20 DIAGNOSIS — R05 Cough: Secondary | ICD-10-CM

## 2018-06-20 DIAGNOSIS — J111 Influenza due to unidentified influenza virus with other respiratory manifestations: Secondary | ICD-10-CM

## 2018-06-20 DIAGNOSIS — R5383 Other fatigue: Secondary | ICD-10-CM

## 2018-06-20 DIAGNOSIS — R059 Cough, unspecified: Secondary | ICD-10-CM

## 2018-06-20 LAB — POCT RAPID STREP A (OFFICE): Rapid Strep A Screen: NEGATIVE

## 2018-06-20 LAB — POCT INFLUENZA A/B
Influenza A, POC: POSITIVE — AB
Influenza B, POC: POSITIVE — AB

## 2018-06-20 MED ORDER — BENZONATATE 200 MG PO CAPS: 200.0000 mg | ORAL_CAPSULE | Freq: Two times a day (BID) | ORAL | 0 refills | Status: DC | PRN

## 2018-06-20 MED ORDER — PROMETHAZINE-DM 6.25-15 MG/5ML PO SYRP: 5.0000 mL | ORAL_SOLUTION | Freq: Four times a day (QID) | ORAL | 0 refills | Status: DC | PRN

## 2018-06-20 MED ORDER — BALOXAVIR MARBOXIL(80 MG DOSE) 2 X 40 MG PO TBPK: 80.0000 mg | ORAL_TABLET | Freq: Once | ORAL | 1 refills | Status: AC

## 2018-06-20 NOTE — Telephone Encounter (Signed)
Pt. Reports he started coughing this weekend. "It hit me pretty hard - I haven't been this sick in years." Non-productive cough, runny nose, headache and "stabbing chest pain with coughing and at rest - I drink water and it goes away." Has wheezing as well. Appointment for this morning. Instructed to go to ED if symptoms worsen.   Reason for Disposition . [1] Continuous (nonstop) coughing interferes with work or school AND [2] no improvement using cough treatment per protocol  Answer Assessment - Initial Assessment Questions 1. ONSET: "When did the cough begin?"      Started the weekend 2. SEVERITY: "How bad is the cough today?"      Severe 3. RESPIRATORY DISTRESS: "Describe your breathing."      Short of breath with the coughing 4. FEVER: "Do you have a fever?" If so, ask: "What is your temperature, how was it measured, and when did it start?"     No 5. HEMOPTYSIS: "Are you coughing up any blood?" If so ask: "How much?" (flecks, streaks, tablespoons, etc.)     No 6. TREATMENT: "What have you done so far to treat the cough?" (e.g., meds, fluids, humidifier)     IBU, Vit C 7. CARDIAC HISTORY: "Do you have any history of heart disease?" (e.g., heart attack, congestive heart failure)      No 8. LUNG HISTORY: "Do you have any history of lung disease?"  (e.g., pulmonary embolus, asthma, emphysema)     No 9. PE RISK FACTORS: "Do you have a history of blood clots?" (or: recent major surgery, recent prolonged travel, bedridden)     No 10. OTHER SYMPTOMS: "Do you have any other symptoms? (e.g., runny nose, wheezing, chest pain)       Runny nose, headache, wheezing 11. PREGNANCY: "Is there any chance you are pregnant?" "When was your last menstrual period?"       n/a 12. TRAVEL: "Have you traveled out of the country in the last month?" (e.g., travel history, exposures)       No  Protocols used: COUGH - ACUTE NON-PRODUCTIVE-A-AH

## 2018-06-20 NOTE — Progress Notes (Signed)
Bruce Morales 43 y.o.   Chief Complaint  Patient presents with  . Sore Throat  . Cough    chronic with chest pains and drainage and pressure     HISTORY OF PRESENT ILLNESS: This is a 43 y.o. male complaining of flulike symptoms that started 2 days ago.  Complaining of cough with pleuritic chest pain and chest congestion.  Influenza  This is a new problem. Episode onset: 2 days ago. The problem occurs constantly. The problem has been rapidly worsening. Associated symptoms include chest pain, chills, congestion, coughing, fatigue, headaches, myalgias, a sore throat and weakness. Pertinent negatives include no abdominal pain, anorexia, fever, nausea, neck pain, numbness, rash, swollen glands, urinary symptoms, vertigo, visual change or vomiting. He has tried nothing for the symptoms.     Prior to Admission medications   Medication Sig Start Date End Date Taking? Authorizing Provider  cyclobenzaprine (FLEXERIL) 10 MG tablet Take 1 tablet (10 mg total) by mouth as needed. 04/25/18  Yes Myles LippsSantiago, Irma M, MD  diltiazem (CARDIZEM) 30 MG tablet TAKE 1 TABLET BY MOUTH AS NEEDED FOR RECTAL PAIN. 06/09/18  Yes Myles LippsSantiago, Irma M, MD  diltiazem 2 % GEL Apply a pea-sized amount to the affected area 3 times daily. 11/15/17  Yes Barnett AbuWiseman, [place] D, PA-C  DUEXIS 800-26.6 MG TABS Take 1 tablet by mouth 3 (three) times daily. 08/15/17  Yes [provider]  NUCYNTA 75 MG tablet TAKE 1 TABLET BY MOUTH EVERY 6 TO 8 HOURS AS NEEDED 02/22/18  Yes [provider]  Vitamin D, Ergocalciferol, (DRISDOL) 1.25 MG (50000 UT) CAPS capsule Take 1 capsule (50,000 Units total) by mouth every 7 (seven) days. 06/08/18  Yes Whitmire, Thermon Leylandawn W, FNP    Allergies  Allergen Reactions  . Hydrocodone Hives and Itching  . Oxycodone     Mild Itching, patient can tolerate oxycodone  . Percocet [Oxycodone-Acetaminophen]     Itching, patient says he tolerates    Patient Active Problem List   Diagnosis Date Noted   . Prediabetes 05/17/2018  . Vitamin D deficiency 05/17/2018  . Hearing loss 09/04/2015  . Bone marrow suppression   . Thrombocytopenia (HCC)   . Acute renal failure (ARF) (HCC) 06/24/2015  . Pancytopenia (HCC) 06/24/2015  . Status post lumbar laminectomy 06/10/2015  . Headache 06/05/2015  . 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 Morbid obesity (HCC) 11/13/2014  . Bilateral low back pain without sciatica 04/14/2014    Past Medical History:  Diagnosis Date  . Anginal pain (HCC) 04/15/2014  . Arthritis    "wrists, knees, lower back" (04/16/2014)  . Carpal tunnel syndrome   . Chronic bronchitis (HCC)    "probably get it q yr"  . Chronic lower back pain   . Chronic lower back pain   . Constipation   . Gall stones   . GERD (gastroesophageal reflux disease)   . GERD (gastroesophageal reflux disease)   . HLD (hyperlipidemia)   . Joint pain   . Meningitis   . Plantar fasciitis   . Right knee pain   . Sciatica     Past Surgical History:  Procedure Laterality Date  . CARPAL TUNNEL RELEASE     02/2018 Right arm  . CHOLECYSTECTOMY  2011  . ESOPHAGOGASTRODUODENOSCOPY N/A 02/27/2013   Procedure: ESOPHAGOGASTRODUODENOSCOPY (EGD);  Surgeon: Florencia Reasonsobert V Buccini, MD;  Location[facility];  Service: Endoscopy;  Laterality: N/A;  . KNEE ARTHROSCOPY Right 2013 X 2  . KNEE ARTHROSCOPY Right 02/22/2018  . LAMINECTOMY  AND MICRODISCECTOMY SPINE    . LEFT HEART CATHETERIZATION WITH CORONARY ANGIOGRAM N/A 04/17/2014   Procedure: LEFT HEART CATHETERIZATION WITH CORONARY ANGIOGRAM;  Surgeon: Ricki Rodriguez, MD;  Location: [facility];  Service: Cardiovascular;  Laterality: N/A;  . RADIOLOGY WITH ANESTHESIA N/A 06/11/2015   Procedure: MRI LUMBAR WITH AND WITHOUT CONTRAST;  Surgeon: Medication Radiologist, MD;  Location: [facility] OR;  Service: Radiology;  Laterality: N/A;    Social History   Socioeconomic History  . Marital status: Married    Spouse name: Janelle Floor   . Number of  children: 2  . Years of education: Not on file  . Highest education level: Not on file  Occupational History  . Occupation: Disabled  Social Needs  . Financial resource strain: Not on file  . Food insecurity:    Worry: Not on file    Inability: Not on file  . Transportation needs:    Medical: Not on file    Non-medical: Not on file  Tobacco Use  . Smoking status: Former Smoker    Packs/day: 0.50    Years: 27.00    Pack years: 13.50    Types: Cigarettes    Last attempt to quit: 03/11/2015    Years since quitting: 3.2  . Smokeless tobacco: Former Engineer, water and Sexual Activity  . Alcohol use: Yes    Alcohol/week: 0.0 standard drinks    Comment: 04/16/2014 "a 6 pack will last me a month"  . Drug use: No    Comment: 04/16/2014 "in my teens"  . Sexual activity: Yes  Lifestyle  . Physical activity:    Days per week: Not on file    Minutes per session: Not on file  . Stress: Not on file  Relationships  . Social connections:    Talks on phone: Not on file    Gets together: Not on file    Attends religious service: Not on file    Active member of club or organization: Not on file    Attends meetings of clubs or organizations: Not on file    Relationship status: Not on file  . Intimate partner violence:    Fear of current or ex partner: Not on file    Emotionally abused: Not on file    Physically abused: Not on file    Forced sexual activity: Not on file  Other Topics Concern  . Not on file  Social History Narrative   Drinks 2 cups of coffee in the morning.    Family History  Problem Relation Age of Onset  . Multiple sclerosis Mother   . Obesity Mother   . Heart failure Maternal Grandfather   . Diabetes Father   . Hyperlipidemia Father   . Sleep apnea Father   . Obesity Father   . Fibromyalgia Sister   . Schizophrenia Brother   . Stroke Maternal Grandmother   . Diabetes Paternal Grandmother      Review of Systems  Constitutional: Positive for chills,  fatigue and malaise/fatigue. Negative for fever.  HENT: Positive for congestion and sore throat.   Eyes: Negative.   Respiratory: Positive for cough and wheezing. Negative for hemoptysis and shortness of breath.   Cardiovascular: Positive for chest pain.  Gastrointestinal: Negative.  Negative for abdominal pain, anorexia, diarrhea, nausea and vomiting.  Genitourinary: Negative.   Musculoskeletal: Positive for myalgias. Negative for neck pain.  Skin: Negative.  Negative for rash.  Neurological: Positive for weakness and headaches. Negative for vertigo and numbness.  Endo/Heme/Allergies:  Negative.   All other systems reviewed and are negative.   Vitals:   06/20/18 1100  BP: 121/74  Pulse: 69  Resp: 16  Temp: 98.5 F (36.9 C)  SpO2: 96%    Physical Exam Vitals signs reviewed.  Constitutional:      Appearance: He is well-developed.  HENT:     Head: Normocephalic.     Nose: Nose normal.     Mouth/Throat:     Mouth: Mucous membranes are moist.     Pharynx: Oropharynx is clear.  Eyes:     Extraocular Movements: Extraocular movements intact.     Pupils: Pupils are equal, round, and reactive to light.  Neck:     Musculoskeletal: Normal range of motion and neck supple.  Cardiovascular:     Rate and Rhythm: Normal rate and regular rhythm.     Heart sounds: Normal heart sounds.  Pulmonary:     Breath sounds: Normal breath sounds.  Musculoskeletal: Normal range of motion.  Skin:    General: Skin is warm and dry.  Neurological:     General: No focal deficit present.     Mental Status: He is alert and oriented to person, place, and time.  Psychiatric:        Mood and Affect: Mood normal.        Behavior: Behavior normal.      Results for orders placed or performed in visit on 06/20/18 (from the past 24 hour(s))  POCT Influenza A/B     Status: Abnormal   Collection Time: 06/20/18 11:14 AM  Result Value Ref Range   Influenza A, POC Positive (A) Negative   Influenza B, POC  Positive (A) Negative  POCT rapid strep A     Status: None   Collection Time: 06/20/18 11:16 AM  Result Value Ref Range   Rapid Strep A Screen Negative Negative   A total of 25 minutes was spent in the room with the patient, greater than 50% of which was in counseling/coordination of care regarding diagnosis, treatment, medications, prognosis, and need for follow-up if no better or worse.   ASSESSMENT & PLAN: Onalee HuaDavid was seen today for sore throat and cough.  Diagnoses and all orders for this visit:  Influenza -     Baloxavir Marboxil,80 MG Dose, (XOFLUZA) 2 x 40 MG TBPK; Take 80 mg by mouth once for 1 dose.  Fatigue, unspecified type -     POCT Influenza A/B -     POCT rapid strep A  Cough -     benzonatate (TESSALON) 200 MG capsule; Take 1 capsule (200 mg total) by mouth 2 (two) times daily as needed for cough. -     promethazine-dextromethorphan (PROMETHAZINE-DM) 6.25-15 MG/5ML syrup; Take 5 mLs by mouth 4 (four) times daily as needed for cough.    Patient Instructions       If you have lab work done today you will be contacted with your lab results within the next 2 weeks.  If you have not heard from us then please contact us. The fastest way to get your results is to register for My Chart.   IF you received an x-ray today, you will receive an invoice from [facility]. Please contact [facility] at [phone] with questions or concerns regarding your invoice.   IF you received labwork today, you will receive an invoice from [facility]. Please contact [facility] at [phone] with questions or concerns regarding your invoice.   Our billing staff will not be able  to assist you with questions regarding bills from these companies.  You will be contacted with the lab results as soon as they are available. The fastest way to get your results is to activate your My Chart account. Instructions are located on the last page of this paperwork. If you have not heard  from [place] regarding the results in 2 weeks, please contact this office.      Influenza, Adult Influenza is also called "the flu." It is an infection in the lungs, nose, and throat (respiratory tract). It is caused by a virus. The flu causes symptoms that are similar to symptoms of a cold. It also causes a high fever and body aches. The flu spreads easily from person to person (is contagious). Getting a flu shot (influenza vaccination) every year is the best way to prevent the flu. What are the causes? This condition is caused by the influenza virus. You can get the virus by:  Breathing in droplets that are in the air from the cough or sneeze of a person who has the virus.  Touching something that has the virus on it (is contaminated) and then touching your mouth, nose, or eyes. What increases the risk? Certain things may make you more likely to get the flu. These include:  Not washing your hands often.  Having close contact with many people during cold and flu season.  Touching your mouth, eyes, or nose without first washing your hands.  Not getting a flu shot every year. You may have a higher risk for the flu, along with serious problems such as a lung infection (pneumonia), if you:  Are older than 65.  Are pregnant.  Have a weakened disease-fighting system (immune system) because of a disease or taking certain medicines.  Have a long-term (chronic) illness, such as: ? Heart, kidney, or lung disease. ? Diabetes. ? Asthma.  Have a liver disorder.  Are very overweight (morbidly obese).  Have anemia. This is a condition that affects your red blood cells. What are the signs or symptoms? Symptoms usually begin suddenly and last 4-14 days. They may include:  Fever and chills.  Headaches, body aches, or muscle aches.  Sore throat.  Cough.  Runny or stuffy (congested) nose.  Chest discomfort.  Not wanting to eat as much as normal (poor appetite).  Weakness or feeling  tired (fatigue).  Dizziness.  Feeling sick to your stomach (nauseous) or throwing up (vomiting). How is this treated? If the flu is found early, you can be treated with medicine that can help reduce how bad the illness is and how long it lasts (antiviral medicine). This may be given by mouth (orally) or through an IV tube. Taking care of yourself at home can help your symptoms get better. Your doctor may suggest:  Taking over-the-counter medicines.  Drinking plenty of fluids. The flu often goes away on its own. If you have very bad symptoms or other problems, you may be treated in a hospital. Follow these instructions at home:     Activity  Rest as needed. Get plenty of sleep.  Stay home from work or school as told by your doctor. ? Do not leave home until you do not have a fever for 24 hours without taking medicine. ? Leave home only to visit your doctor. Eating and drinking  Take an ORS (oral rehydration solution). This is a drink that is sold at pharmacies and stores.  Drink enough fluid to keep your pee (urine) pale yellow.  Drink clear fluids in small amounts as you are able. Clear fluids include: ? Water. ? Ice chips. ? Fruit juice that has water added (diluted fruit juice). ? Low-calorie sports drinks.  Eat bland, easy-to-digest foods in small amounts as you are able. These foods include: ? Bananas. ? Applesauce. ? Rice. ? Lean meats. ? Toast. ? Crackers.  Do not eat or drink: ? Fluids that have a lot of sugar or caffeine. ? Alcohol. ? Spicy or fatty foods. General instructions  Take over-the-counter and prescription medicines only as told by your doctor.  Use a cool mist humidifier to add moisture to the air in your home. This can make it easier for you to breathe.  Cover your mouth and nose when you cough or sneeze.  Wash your hands with soap and water often, especially after you cough or sneeze. If you cannot use soap and water, use alcohol-based hand  sanitizer.  Keep all follow-up visits as told by your doctor. This is important. How is this prevented?   Get a flu shot every year. You may get the flu shot in late summer, fall, or winter. Ask your doctor when you should get your flu shot.  Avoid contact with people who are sick during fall and winter (cold and flu season). Contact a doctor if:  You get new symptoms.  You have: ? Chest pain. ? Watery poop (diarrhea). ? A fever.  Your cough gets worse.  You start to have more mucus.  You feel sick to your stomach.  You throw up. Get help right away if you:  Have shortness of breath.  Have trouble breathing.  Have skin or nails that turn a bluish color.  Have very bad pain or stiffness in your neck.  Get a sudden headache.  Get sudden pain in your face or ear.  Cannot eat or drink without throwing up. Summary  Influenza ("the flu") is an infection in the lungs, nose, and throat. It is caused by a virus.  Take over-the-counter and prescription medicines only as told by your doctor.  Getting a flu shot every year is the best way to avoid getting the flu. This information is not intended to replace advice given to you by your health care provider. Make sure you discuss any questions you have with your health care provider. Document Released: 02/24/2008 Document Revised: 11/02/2017 Document Reviewed: 11/02/2017 Elsevier Interactive Patient Education  2019 Elsevier Inc.      Edwina Barth, MD [facility]

## 2018-06-20 NOTE — Patient Instructions (Addendum)
   If you have lab work done today you will be contacted with your lab results within the next 2 weeks.  If you have not heard from us then please contact us. The fastest way to get your results is to register for My Chart.   IF you received an x-ray today, you will receive an invoice from [facility]. Please contact  Radiology at [phone] with questions or concerns regarding your invoice.   IF you received labwork today, you will receive an invoice from [facility]. Please contact [facility] at [phone] with questions or concerns regarding your invoice.   Our billing staff will not be able to assist you with questions regarding bills from these companies.  You will be contacted with the lab results as soon as they are available. The fastest way to get your results is to activate your My Chart account. Instructions are located on the last page of this paperwork. If you have not heard from us regarding the results in 2 weeks, please contact this office.     Influenza, Adult Influenza is also called "the flu." It is an infection in the lungs, nose, and throat (respiratory tract). It is caused by a virus. The flu causes symptoms that are similar to symptoms of a cold. It also causes a high fever and body aches. The flu spreads easily from person to person (is contagious). Getting a flu shot (influenza vaccination) every year is the best way to prevent the flu. What are the causes? This condition is caused by the influenza virus. You can get the virus by:  Breathing in droplets that are in the air from the cough or sneeze of a person who has the virus.  Touching something that has the virus on it (is contaminated) and then touching your mouth, nose, or eyes. What increases the risk? Certain things may make you more likely to get the flu. These include:  Not washing your hands often.  Having close contact with many people during cold and flu season.  Touching your  mouth, eyes, or nose without first washing your hands.  Not getting a flu shot every year. You may have a higher risk for the flu, along with serious problems such as a lung infection (pneumonia), if you:  Are older than 65.  Are pregnant.  Have a weakened disease-fighting system (immune system) because of a disease or taking certain medicines.  Have a long-term (chronic) illness, such as: ? Heart, kidney, or lung disease. ? Diabetes. ? Asthma.  Have a liver disorder.  Are very overweight (morbidly obese).  Have anemia. This is a condition that affects your red blood cells. What are the signs or symptoms? Symptoms usually begin suddenly and last 4-14 days. They may include:  Fever and chills.  Headaches, body aches, or muscle aches.  Sore throat.  Cough.  Runny or stuffy (congested) nose.  Chest discomfort.  Not wanting to eat as much as normal (poor appetite).  Weakness or feeling tired (fatigue).  Dizziness.  Feeling sick to your stomach (nauseous) or throwing up (vomiting). How is this treated? If the flu is found early, you can be treated with medicine that can help reduce how bad the illness is and how long it lasts (antiviral medicine). This may be given by mouth (orally) or through an IV tube. Taking care of yourself at home can help your symptoms get better. Your doctor may suggest:  Taking over-the-counter medicines.  Drinking plenty of fluids. The flu often   goes away on its own. If you have very bad symptoms or other problems, you may be treated in a hospital. Follow these instructions at home:     Activity  Rest as needed. Get plenty of sleep.  Stay home from work or school as told by your doctor. ? Do not leave home until you do not have a fever for 24 hours without taking medicine. ? Leave home only to visit your doctor. Eating and drinking  Take an ORS (oral rehydration solution). This is a drink that is sold at pharmacies and  stores.  Drink enough fluid to keep your pee (urine) pale yellow.  Drink clear fluids in small amounts as you are able. Clear fluids include: ? Water. ? Ice chips. ? Fruit juice that has water added (diluted fruit juice). ? Low-calorie sports drinks.  Eat bland, easy-to-digest foods in small amounts as you are able. These foods include: ? Bananas. ? Applesauce. ? Rice. ? Lean meats. ? Toast. ? Crackers.  Do not eat or drink: ? Fluids that have a lot of sugar or caffeine. ? Alcohol. ? Spicy or fatty foods. General instructions  Take over-the-counter and prescription medicines only as told by your doctor.  Use a cool mist humidifier to add moisture to the air in your home. This can make it easier for you to breathe.  Cover your mouth and nose when you cough or sneeze.  Wash your hands with soap and water often, especially after you cough or sneeze. If you cannot use soap and water, use alcohol-based hand sanitizer.  Keep all follow-up visits as told by your doctor. This is important. How is this prevented?   Get a flu shot every year. You may get the flu shot in late summer, fall, or winter. Ask your doctor when you should get your flu shot.  Avoid contact with people who are sick during fall and winter (cold and flu season). Contact a doctor if:  You get new symptoms.  You have: ? Chest pain. ? Watery poop (diarrhea). ? A fever.  Your cough gets worse.  You start to have more mucus.  You feel sick to your stomach.  You throw up. Get help right away if you:  Have shortness of breath.  Have trouble breathing.  Have skin or nails that turn a bluish color.  Have very bad pain or stiffness in your neck.  Get a sudden headache.  Get sudden pain in your face or ear.  Cannot eat or drink without throwing up. Summary  Influenza ("the flu") is an infection in the lungs, nose, and throat. It is caused by a virus.  Take over-the-counter and prescription  medicines only as told by your doctor.  Getting a flu shot every year is the best way to avoid getting the flu. This information is not intended to replace advice given to you by your health care provider. Make sure you discuss any questions you have with your health care provider. Document Released: 02/24/2008 Document Revised: 11/02/2017 Document Reviewed: 11/02/2017 Elsevier Interactive Patient Education  2019 Elsevier Inc.  

## 2018-06-23 ENCOUNTER — Telehealth: Payer: Self-pay | Admitting: Physician Assistant

## 2018-06-23 NOTE — Telephone Encounter (Signed)
Spoke with patient regarding message, Urgent Care/ED.

## 2018-06-23 NOTE — Telephone Encounter (Signed)
Spoke with patient concerned if you think that he has the Coronavirus virus. Pt stated [place] traveled [place] from [place] on last Thursday and got back on 06/18/2018. He states that "he feels like crap, still cruddy, diarrhea, heavy cough, yellowish/orange mucus" getting a little from nose and chest, "doing all precautions."

## 2018-06-23 NOTE — Telephone Encounter (Signed)
He tested positive for influenza A and B and that is why he feels this way.  If he feels he is getting worse then he should be evaluated at an urgent care center.  Thanks.

## 2018-06-23 NOTE — Telephone Encounter (Signed)
Copied from CRM [id number]. Topic: Quick Communication - See Telephone Encounter >> Jun 23, 2018  9:41 AM Lorrine Kin, NT wrote: CRM for notification. See Telephone encounter for: 06/23/18. Patient states that he was seen on 06/20/2018 with Dr Alvy Bimler and was diagnosed with type A and B flu. States that his wife is worried about the [place] virus that is going around because over this past weekend, he flew in from [place]. Please advise. Would like a call to discuss. CB#: [phone]

## 2018-06-27 ENCOUNTER — Encounter (INDEPENDENT_AMBULATORY_CARE_PROVIDER_SITE_OTHER): Payer: Self-pay | Admitting: Family Medicine

## 2018-06-27 ENCOUNTER — Ambulatory Visit (INDEPENDENT_AMBULATORY_CARE_PROVIDER_SITE_OTHER): Payer: Medicare HMO | Admitting: Family Medicine

## 2018-06-27 VITALS — BP 127/74 | HR 72 | Temp 97.6°F | Ht 68.0 in | Wt 249.0 lb

## 2018-06-27 DIAGNOSIS — R7303 Prediabetes: Secondary | ICD-10-CM | POA: Diagnosis not present

## 2018-06-27 DIAGNOSIS — Z6837 Body mass index (BMI) 37.0-37.9, adult: Secondary | ICD-10-CM

## 2018-06-27 NOTE — Progress Notes (Signed)
Office: [phone]  /  Fax: [phone]   HPI:   Chief Complaint: OBESITY Bruce Morales is here to discuss his progress with his obesity treatment plan. He is on the Category 3 plan with breakfast options and is following his eating plan approximately 85% of the time. He states he is exercising 0 minutes 0 times per week. Bruce Morales feels he sticks to the diet fairly well, but feels he does not have a lot of motivation right now. He is discouraged with lack of weight loss. He does skip meals quite often. His weight is 249 lb (112.9 kg) today and has gained 3 pounds since his last visit. He has lost 0 lbs since starting treatment with [place].  Pre-Diabetes Bruce Morales has a diagnosis of pre-diabetes based on his elevated Hgb A1c and was informed this puts him at greater risk of developing diabetes. He is not taking metformin currently and continues to work on diet and exercise to decrease risk of diabetes. He denies polyphagia or hypoglycemia.  ALLERGIES: Allergies  Allergen Reactions  . Hydrocodone Hives and Itching  . Oxycodone     Mild Itching, patient can tolerate oxycodone  . Percocet [Oxycodone-Acetaminophen]     Itching, patient says he tolerates    MEDICATIONS: Current Outpatient Medications on File Prior to Visit  Medication Sig Dispense Refill  . benzonatate (TESSALON) 200 MG capsule Take 1 capsule (200 mg total) by mouth 2 (two) times daily as needed for cough. 20 capsule 0  . cyclobenzaprine (FLEXERIL) 10 MG tablet Take 1 tablet (10 mg total) by mouth as needed. 30 tablet 2  . diltiazem (CARDIZEM) 30 MG tablet TAKE 1 TABLET BY MOUTH AS NEEDED FOR RECTAL PAIN. 20 tablet 0  . diltiazem 2 % GEL Apply a pea-sized amount to the affected area 3 times daily. 30 g 0  . DUEXIS 800-26.6 MG TABS Take 1 tablet by mouth 3 (three) times daily.  0  . NUCYNTA 75 MG tablet TAKE 1 TABLET BY MOUTH EVERY 6 TO 8 HOURS AS NEEDED  0  . promethazine-dextromethorphan (PROMETHAZINE-DM) 6.25-15 MG/5ML syrup Take 5  mLs by mouth 4 (four) times daily as needed for cough. 118 mL 0  . Vitamin D, Ergocalciferol, (DRISDOL) 1.25 MG (50000 UT) CAPS capsule Take 1 capsule (50,000 Units total) by mouth every 7 (seven) days. 4 capsule 0   No current facility-administered medications on file prior to visit.     PAST MEDICAL HISTORY: Past Medical History:  Diagnosis Date  . Anginal pain (HCC) 04/15/2014  . Arthritis    "wrists, knees, lower back" (04/16/2014)  . Carpal tunnel syndrome   . Chronic bronchitis (HCC)    "probably get it q yr"  . Chronic lower back pain   . Chronic lower back pain   . Constipation   . Gall stones   . GERD (gastroesophageal reflux disease)   . GERD (gastroesophageal reflux disease)   . HLD (hyperlipidemia)   . Joint pain   . Meningitis   . Plantar fasciitis   . Right knee pain   . Sciatica     PAST SURGICAL HISTORY: Past Surgical History:  Procedure Laterality Date  . CARPAL TUNNEL RELEASE     02/2018 Right arm  . CHOLECYSTECTOMY  2011  . ESOPHAGOGASTRODUODENOSCOPY N/A 02/27/2013   Procedure: ESOPHAGOGASTRODUODENOSCOPY (EGD);  Surgeon: Florencia Reasons, MD;  Location: [facility];  Service: Endoscopy;  Laterality: N/A;  . KNEE ARTHROSCOPY Right 2013 X 2  . KNEE ARTHROSCOPY Right 02/22/2018  . LAMINECTOMY  AND MICRODISCECTOMY SPINE    . LEFT HEART CATHETERIZATION WITH CORONARY ANGIOGRAM N/A 04/17/2014   Procedure: LEFT HEART CATHETERIZATION WITH CORONARY ANGIOGRAM;  Surgeon: Ricki RodriguezAjay S Kadakia, MD;  Location: [facility];  Service: Cardiovascular;  Laterality: N/A;  . RADIOLOGY WITH ANESTHESIA N/A 06/11/2015   Procedure: MRI LUMBAR WITH AND WITHOUT CONTRAST;  Surgeon: Medication Radiologist, MD;  Location: [facility] OR;  Service: Radiology;  Laterality: N/A;    SOCIAL HISTORY: Social History   Tobacco Use  . Smoking status: Former Smoker    Packs/day: 0.50    Years: 27.00    Pack years: 13.50    Types: Cigarettes    Last attempt to quit: 03/11/2015    Years since  quitting: 3.2  . Smokeless tobacco: Former Engineer, waterUser  Substance Use Topics  . Alcohol use: Yes    Alcohol/week: 0.0 standard drinks    Comment: 04/16/2014 "a 6 pack will last me a month"  . Drug use: No    Comment: 04/16/2014 "in my teens"    FAMILY HISTORY: Family History  Problem Relation Age of Onset  . Multiple sclerosis Mother   . Obesity Mother   . Heart failure Maternal Grandfather   . Diabetes Father   . Hyperlipidemia Father   . Sleep apnea Father   . Obesity Father   . Fibromyalgia Sister   . Schizophrenia Brother   . Stroke Maternal Grandmother   . Diabetes Paternal Grandmother     ROS: Review of Systems  Constitutional: Negative for weight loss.  Endo/Heme/Allergies:       Negative polyphagia Negative hypoglycemia    PHYSICAL EXAM: Blood pressure 127/74, pulse 72, temperature 97.6 F (36.4 C), temperature source Oral, height 5\' 8"  (1.727 m), weight 249 lb (112.9 kg), SpO2 98 %. Body mass index is 37.86 kg/m. Physical Exam Vitals signs reviewed.  Constitutional:      Appearance: Normal appearance. He is obese.  Cardiovascular:     Rate and Rhythm: Normal rate.     Pulses: Normal pulses.  Pulmonary:     Effort: Pulmonary effort is normal.     Breath sounds: Normal breath sounds.  Musculoskeletal: Normal range of motion.  Skin:    General: Skin is warm and dry.  Neurological:     Mental Status: He is alert and oriented to person, place, and time.  Psychiatric:        Mood and Affect: Mood normal.        Behavior: Behavior normal.     RECENT LABS AND TESTS: BMET    Component Value Date/Time   NA 138 03/30/2018 1016   K 4.5 03/30/2018 1016   CL 102 03/30/2018 1016   CO2 22 03/30/2018 1016   GLUCOSE 90 03/30/2018 1016   GLUCOSE 87 08/14/2015 1550   BUN 18 03/30/2018 1016   CREATININE 1.17 03/30/2018 1016   CREATININE 1.16 08/14/2015 1550   CALCIUM 8.8 03/30/2018 1016   GFRNONAA 78 03/30/2018 1016   GFRNONAA 49 (L) 07/12/2015 0906   GFRAA 90  03/30/2018 1016   GFRAA 57 (L) 07/12/2015 0906   Lab Results  Component Value Date   HGBA1C 5.7 (H) 03/30/2018   HGBA1C 5.2 09/13/2014   Lab Results  Component Value Date   INSULIN 11.6 03/30/2018   CBC    Component Value Date/Time   WBC 4.8 03/30/2018 1016   WBC 5.4 08/14/2015 1550   RBC 4.61 03/30/2018 1016   RBC 3.95 (L) 08/14/2015 1550   HGB 13.7 03/30/2018 1016  HCT 40.2 03/30/2018 1016   PLT 145 (L) 12/08/2017 0944   MCV 87 03/30/2018 1016   MCV 90 07/14/2011 1441   MCH 29.7 03/30/2018 1016   MCH 28.1 08/14/2015 1550   MCHC 34.1 03/30/2018 1016   MCHC 32.4 08/14/2015 1550   RDW 12.6 03/30/2018 1016   RDW 12.8 07/14/2011 1441   LYMPHSABS 1.8 03/30/2018 1016   LYMPHSABS 1.8 07/14/2011 1441   MONOABS 0.4 06/29/2015 0447   MONOABS 0.3 07/14/2011 1441   EOSABS 0.1 03/30/2018 1016   EOSABS 0.1 07/14/2011 1441   BASOSABS 0.0 03/30/2018 1016   BASOSABS 0.0 07/14/2011 1441   Iron/TIBC/Ferritin/ %Sat No results found for: IRON, TIBC, FERRITIN, IRONPCTSAT Lipid Panel     Component Value Date/Time   CHOL 200 (H) 03/30/2018 1016   TRIG 146 03/30/2018 1016   HDL 29 (L) 03/30/2018 1016   CHOLHDL 6.4 (H) 12/08/2017 0944   CHOLHDL 11.7 (H) 03/12/2015 1633   VLDL 75 (H) 03/12/2015 1633   LDLCALC 142 (H) 03/30/2018 1016   Hepatic Function Panel     Component Value Date/Time   PROT 6.4 03/30/2018 1016   ALBUMIN 4.4 03/30/2018 1016   AST 25 03/30/2018 1016   ALT 47 (H) 03/30/2018 1016   ALKPHOS 61 03/30/2018 1016   BILITOT 0.8 03/30/2018 1016   BILIDIR 0.1 04/21/2015 1712   IBILI 0.8 04/21/2015 1712      Component Value Date/Time   TSH 3.240 03/30/2018 1016   TSH 2.120 12/08/2017 0944   TSH 1.053 06/24/2015 1347   TSH 1.105 03/12/2015 1633    ASSESSMENT AND PLAN: Prediabetes  Class 2 severe obesity with serious comorbidity and body mass index (BMI) of 37.0 to 37.9 in adult, unspecified obesity type (HCC)  PLAN:  Pre-Diabetes Bruce Morales will continue to  work on weight loss, exercise, and decreasing simple carbohydrates in his diet to help decrease the risk of diabetes. . We will recheck A1c at next visit. Bruce Morales agrees to follow up with our clinic in 2 to 3 weeks as directed to monitor his progress.  Obesity Bruce Morales is currently in the action stage of change. As such, his goal is to continue with weight loss efforts He has agreed to keep a food journal with 1500-1700 calories and 100 grams of protein daily Bruce Morales has not been prescribed exercise at this time. We discussed the following Behavioral Modification Strategies today: increasing lean protein intake, no skipping meals, planning for success, and keep a strict food journal I demonstrated My[employer]itness Pal.  Bruce Morales has agreed to follow up with our clinic in 2 to 3 weeks. He was informed of the importance of frequent follow up visits to maximize his success with intensive lifestyle modifications for his multiple health conditions.   OBESITY BEHAVIORAL INTERVENTION VISIT  Today's visit was # 6  Starting weight: 248 lbs Starting date: 03/30/18 Today's weight : 249 lbs  Today's date: 06/27/2018 Total lbs lost to date: 0 At least 15 minutes were spent on discussing the following behavioral intervention visit.   ASK: We discussed the diagnosis of obesity with Bruce Morales today and Bruce Morales agreed to give [place] permission to discuss obesity behavioral modification therapy today.  ASSESS: Bruce Morales has the diagnosis of obesity and his BMI today is 37.87 Bruce Morales is in the action stage of change   ADVISE: Bruce Morales was educated on the multiple health risks of obesity as well as the benefit of weight loss to improve his health. He was advised of the need for long  term treatment and the importance of lifestyle modifications to improve his current health and to decrease his risk of future health problems.  AGREE: Multiple dietary modification options and treatment options were discussed and  Bruce Morales agreed to  follow the recommendations documented in the above note.  ARRANGE: Bruce Morales was educated on the importance of frequent visits to treat obesity as outlined per CMS and USPSTF guidelines and agreed to schedule his next follow up appointment today.  Trude McburneyI, Sharon Martin, am acting as Energy managertranscriptionist for AshlandDawn Halo Laski, FNP-C.  I have reviewed the above documentation for accuracy and completeness, and I agree with the above.  - Kenton Fortin, FNP-C.

## 2018-07-12 ENCOUNTER — Telehealth: Payer: Self-pay | Admitting: Family Medicine

## 2018-07-12 DIAGNOSIS — K594 Anal spasm: Secondary | ICD-10-CM

## 2018-07-12 NOTE — Telephone Encounter (Signed)
Requested medication (s) are due for refill today -yes  Requested medication (s) are on the active medication list -yes  Future visit scheduled -yes  Last refill: 06/09/18  Notes to clinic: Patient is requesting refill of medication- review for short term usage. ( meets protocol- but review by PCP requested)  Requested Prescriptions  Pending Prescriptions Disp Refills   diltiazem (CARDIZEM) 30 MG tablet [Pharmacy Med Name: DILTIAZEM 30 MG TABLET] 20 tablet 0    Sig: TAKE 1 TABLET BY MOUTH AS NEEDED FOR RECTAL PAIN.     Cardiovascular:  Calcium Channel Blockers Passed - 07/12/2018  9:38 AM      Passed - Last BP in normal range    BP Readings from Last 1 Encounters:  06/27/18 127/74         Passed - Valid encounter within last 6 months    Recent Outpatient Visits          3 weeks ago Influenza   [facility] at [facility], [place], MD   4 months ago Class 2 obesity without serious comorbidity with body mass index (BMI) of 37.0 to 37.9 in adult, unspecified obesity type   Primary Care at [facility], [place] D, PA-C   7 months ago Annual physical exam   Primary Care at [place], [place] D, PA-C   7 months ago Anal fissure   Primary Care at [place], [place] D, PA-C   8 months ago Chronic pain syndrome   Primary Care at [place], Asencion Partridge, MD      Future Appointments            In 8 months Sagardia, [name], MD Primary Care at [place], [facility]            Requested Prescriptions  Pending Prescriptions Disp Refills   diltiazem (CARDIZEM) 30 MG tablet [Pharmacy Med Name: DILTIAZEM 30 MG TABLET] 20 tablet 0    Sig: TAKE 1 TABLET BY MOUTH AS NEEDED FOR RECTAL PAIN.     Cardiovascular:  Calcium Channel Blockers Passed - 07/12/2018  9:38 AM      Passed - Last BP in normal range    BP Readings from Last 1 Encounters:  06/27/18 127/74         Passed - Valid encounter within last 6 months    Recent Outpatient Visits          3 weeks ago  Influenza   Primary Care at [facility], [place], MD   4 months ago Class 2 obesity without serious comorbidity with body mass index (BMI) of 37.0 to 37.9 in adult, unspecified obesity type   Primary Care at [facility], [place] D, PA-C   7 months ago Annual physical exam   Primary Care at [place], [place] D, PA-C   7 months ago Anal fissure   Primary Care at [place], [place] D, PA-C   8 months ago Chronic pain syndrome   Primary Care at [place], Asencion Partridge, MD      Future Appointments            In 8 months Sagardia, [name], MD Primary Care at [place], [facility]

## 2018-07-18 ENCOUNTER — Encounter (INDEPENDENT_AMBULATORY_CARE_PROVIDER_SITE_OTHER): Payer: Self-pay | Admitting: Family Medicine

## 2018-07-18 ENCOUNTER — Other Ambulatory Visit (INDEPENDENT_AMBULATORY_CARE_PROVIDER_SITE_OTHER): Payer: Self-pay | Admitting: Family Medicine

## 2018-07-18 ENCOUNTER — Ambulatory Visit (INDEPENDENT_AMBULATORY_CARE_PROVIDER_SITE_OTHER): Payer: Medicare HMO | Admitting: Family Medicine

## 2018-07-18 VITALS — BP 121/84 | HR 80 | Temp 97.6°F | Ht 68.0 in | Wt 250.0 lb

## 2018-07-18 DIAGNOSIS — Z6838 Body mass index (BMI) 38.0-38.9, adult: Secondary | ICD-10-CM | POA: Diagnosis not present

## 2018-07-18 DIAGNOSIS — F329 Major depressive disorder, single episode, unspecified: Secondary | ICD-10-CM | POA: Insufficient documentation

## 2018-07-18 DIAGNOSIS — E559 Vitamin D deficiency, unspecified: Secondary | ICD-10-CM

## 2018-07-18 DIAGNOSIS — R7303 Prediabetes: Secondary | ICD-10-CM

## 2018-07-18 DIAGNOSIS — R69 Illness, unspecified: Secondary | ICD-10-CM | POA: Diagnosis not present

## 2018-07-18 DIAGNOSIS — F3289 Other specified depressive episodes: Secondary | ICD-10-CM

## 2018-07-18 DIAGNOSIS — E7849 Other hyperlipidemia: Secondary | ICD-10-CM | POA: Diagnosis not present

## 2018-07-18 DIAGNOSIS — F32A Depression, unspecified: Secondary | ICD-10-CM | POA: Insufficient documentation

## 2018-07-18 MED ORDER — VITAMIN D (ERGOCALCIFEROL) 1.25 MG (50000 UNIT) PO CAPS: 50000.0000 [IU] | ORAL_CAPSULE | ORAL | 0 refills | Status: DC

## 2018-07-18 NOTE — Progress Notes (Signed)
Office: [phone]  /  Fax: [phone]   HPI:   Chief Complaint: OBESITY Bruce Morales is here to discuss his progress with his obesity treatment plan. He is on the  keep a food journal with 1500-1700 calories and 100 grams of protein daily and is following his eating plan approximately 25 % of the time. He states he is walking 30 minutes 1 times per week. Roland's emotional eating is greatly impacting his success with weight loss. He has chronic pain and family stressors. He deals with these stressors with food.  His weight is 250 lb (113.4 kg) today and has gained 1 lbs since his last visit. He has lost 0 lbs since starting treatment with us.  Pre-Diabetes Bruce Morales has a diagnosis of prediabetes based on his elevated Hgb A1c and was informed this puts him at greater risk of developing diabetes. He is not taking metformin currently and continues to work on diet and exercise to decrease risk of diabetes. His last A1C was 5.7 on 10/31 /19. He denies nausea or hypoglycemia. He denies polyphagia.   Hyperlipidemia Bruce Morales has hyperlipidemia and has been trying to improve his cholesterol levels with intensive lifestyle modification including a low saturated fat diet, exercise and weight loss. He is not on a statin.  He denies any chest pain or shortness of breath. His last LDL 142, HDL 291, Tg 146 on 03/30/2018.  Depression with emotional eating behaviors Bruce Morales is struggling with emotional eating and using food for comfort to the extent that it is negatively impacting his health. He often snacks when he is not hungry. Bruce Morales sometimes feels he is out of control and then feels guilty that he made poor food choices. He has been working on behavior modification techniques to help reduce his emotional eating and has been somewhat successful. Bruce Morales struggles greatly with emotional eating to manage stress. He has a history of drug addiction and feels that he has transferred his addictive behavior to food. He feels  lack of motivation though he resolves daily to get back on track. He expresses self-loathing because of not being able to change his behavior.  He shows no sign of suicidal or homicidal ideations.  [facility] 06/20/2018 03/30/2018 03/14/2018 12/08/2017 11/15/2017  Decreased Interest 0 2 0 0 0  Down, Depressed, Hopeless 0 1 0 0 0  PHQ - 2 Score 0 3 0 0 0  Altered sleeping - 1 - - -  Tired, decreased energy - 2 - - -  Change in appetite - 2 - - -  Feeling bad or failure about yourself  - 0 - - -  Trouble concentrating - 1 - - -  Moving slowly or fidgety/restless - 0 - - -  Suicidal thoughts - 0 - - -  PHQ-9 Score - 9 - - -  Difficult doing work/chores - Very difficult - - -    Vitamin D deficiency Bruce Morales has a diagnosis of vitamin D deficiency. He is currently taking prescription Vit D and denies nausea, vomiting or muscle weakness. His last Vit D was 25.2 on 03/30/18  At risk for osteopenia and osteoporosis Bruce Morales is at higher risk of osteopenia and osteoporosis due to vitamin D deficiency.    ASSESSMENT AND PLAN:  Prediabetes - Plan: Comprehensive metabolic panel, Hemoglobin A1c, Insulin, random  Other hyperlipidemia - Plan: Lipid Panel With LDL/HDL Ratio  Vitamin D deficiency - Plan: VITAMIN D 25 Hydroxy (Vit-D Deficiency, Fractures), Vitamin D, Ergocalciferol, (DRISDOL) 1.25 MG (50000 UT) CAPS  capsule  Other depression  Class 2 severe obesity with serious comorbidity and body mass index (BMI) of 38.0 to 38.9 in adult, unspecified obesity type [facility](HCC)  PLAN:  Pre-Diabetes Bruce Morales will continue to work on weight loss, exercise, and decreasing simple carbohydrates in his diet to help decrease the risk of diabetes. We dicussed metformin including benefits and risks. He was informed that eating too many simple carbohydrates or too many calories at one sitting increases the likelihood of GI side effects. Bruce Morales declined metformin for now and a prescription was not written  today. We will check labs today. Bruce Morales agrees to follow up with our clinic in 4 weeks.  Hyperlipidemia Bruce Morales was informed of the American Heart Association Guidelines emphasizing intensive lifestyle modifications as the first line treatment for hyperlipidemia. We discussed many lifestyle modifications today in depth, and Bruce Morales will continue to work on decreasing saturated fats such as fatty red meat, butter and many fried foods. He will also increase vegetables and lean protein in his diet and continue to work on exercise and weight loss efforts. We will check labs today. Bruce Morales agrees to follow up with our clinic in 4 weeks.  Depression with Emotional Eating Behaviors We discussed behavior modification techniques today to help Bruce Morales deal with his emotional eating and depression. We will refer to Dr.Barker and set up appointment for 2-3 weeks. Bruce Huaavid asked to follow up with me in 4 weeks rather than 2-3 since he will be paying a co-pay for both visits.  Vitamin D Deficiency Bruce Morales was informed that low vitamin D levels contributes to fatigue and are associated with obesity, breast, and colon cancer. He agrees to continue to take prescription Vit D 50,000 IU every week #4 with no refills and will follow up for routine testing of vitamin D, at least 2-3 times per year. He was informed of the risk of over-replacement of vitamin D and agrees to not increase his dose unless he discusses this with us first. We will check labs today. Bruce Morales agrees to follow up with our clinic in 4 weeks.   Obesity Bruce Morales is currently in the action stage of change. As such, his goal is to continue with weight loss efforts He has agreed to keep a food journal with 1500-1700 calories and 100 grams of  protein daily Bruce Morales has been instructed to continue with exercise regimen. We discussed the following Behavioral Modification Strategies today: Planning for success and keeping a strict food journal  Bruce Morales has agreed to follow up  with our clinic in 4 weeks. He was informed of the importance of frequent follow up visits to maximize his success with intensive lifestyle modifications for his multiple health conditions.  ALLERGIES: Allergies  Allergen Reactions  . Hydrocodone Hives and Itching  . Oxycodone     Mild Itching, patient can tolerate oxycodone  . Percocet [Oxycodone-Acetaminophen]     Itching, patient says he tolerates    MEDICATIONS: Current Outpatient Medications on File Prior to Visit  Medication Sig Dispense Refill  . benzonatate (TESSALON) 200 MG capsule Take 1 capsule (200 mg total) by mouth 2 (two) times daily as needed for cough. 20 capsule 0  . cyclobenzaprine (FLEXERIL) 10 MG tablet Take 1 tablet (10 mg total) by mouth as needed. 30 tablet 2  . diltiazem (CARDIZEM) 30 MG tablet TAKE 1 TABLET BY MOUTH AS NEEDED FOR RECTAL PAIN. 20 tablet 0  . diltiazem 2 % GEL Apply a pea-sized amount to the affected area 3 times daily. 30 g  0  . DUEXIS 800-26.6 MG TABS Take 1 tablet by mouth 3 (three) times daily.  0  . NUCYNTA 75 MG tablet TAKE 1 TABLET BY MOUTH EVERY 6 TO 8 HOURS AS NEEDED  0  . promethazine-dextromethorphan (PROMETHAZINE-DM) 6.25-15 MG/5ML syrup Take 5 mLs by mouth 4 (four) times daily as needed for cough. 118 mL 0   No current facility-administered medications on file prior to visit.     PAST MEDICAL HISTORY: Past Medical History:  Diagnosis Date  . Anginal pain (HCC) 04/15/2014  . Arthritis    "wrists, knees, lower back" (04/16/2014)  . Carpal tunnel syndrome   . Chronic bronchitis (HCC)    "probably get it q yr"  . Chronic lower back pain   . Chronic lower back pain   . Constipation   . Gall stones   . GERD (gastroesophageal reflux disease)   . GERD (gastroesophageal reflux disease)   . HLD (hyperlipidemia)   . Joint pain   . Meningitis   . Plantar fasciitis   . Right knee pain   . Sciatica     PAST SURGICAL HISTORY: Past Surgical History:  Procedure Laterality Date    . CARPAL TUNNEL RELEASE     02/2018 Right arm  . CHOLECYSTECTOMY  2011  . ESOPHAGOGASTRODUODENOSCOPY N/A 02/27/2013   Procedure: ESOPHAGOGASTRODUODENOSCOPY (EGD);  Surgeon: Florencia Reasons, MD;  Location: [facility];  Service: Endoscopy;  Laterality: N/A;  . KNEE ARTHROSCOPY Right 2013 X 2  . KNEE ARTHROSCOPY Right 02/22/2018  . LAMINECTOMY AND MICRODISCECTOMY SPINE    . LEFT HEART CATHETERIZATION WITH CORONARY ANGIOGRAM N/A 04/17/2014   Procedure: LEFT HEART CATHETERIZATION WITH CORONARY ANGIOGRAM;  Surgeon: Ricki Rodriguez, MD;  Location: [facility];  Service: Cardiovascular;  Laterality: N/A;  . RADIOLOGY WITH ANESTHESIA N/A 06/11/2015   Procedure: MRI LUMBAR WITH AND WITHOUT CONTRAST;  Surgeon: Medication Radiologist, MD;  Location: [facility] OR;  Service: Radiology;  Laterality: N/A;    SOCIAL HISTORY: Social History   Tobacco Use  . Smoking status: Former Smoker    Packs/day: 0.50    Years: 27.00    Pack years: 13.50    Types: Cigarettes    Last attempt to quit: 03/11/2015    Years since quitting: 3.3  . Smokeless tobacco: Former Engineer, water Use Topics  . Alcohol use: Yes    Alcohol/week: 0.0 standard drinks    Comment: 04/16/2014 "a 6 pack will last me a month"  . Drug use: No    Comment: 04/16/2014 "in my teens"    FAMILY HISTORY: Family History  Problem Relation Age of Onset  . Multiple sclerosis Mother   . Obesity Mother   . Heart failure Maternal Grandfather   . Diabetes Father   . Hyperlipidemia Father   . Sleep apnea Father   . Obesity Father   . Fibromyalgia Sister   . Schizophrenia Brother   . Stroke Maternal Grandmother   . Diabetes Paternal Grandmother     ROS: Review of Systems  Constitutional: Negative for weight loss.  Respiratory: Negative for shortness of breath.   Cardiovascular: Negative for chest pain.  Gastrointestinal: Negative for nausea and vomiting.  Genitourinary:       Negative for polyuria  Musculoskeletal:       Negative for  muscle weakness  Endo/Heme/Allergies:       Negative for hypoglycemia Negative for polyphagia  Psychiatric/Behavioral: Positive for depression. Negative for suicidal ideas.       Negative for homicidal ideation  PHYSICAL EXAM: Blood pressure 121/84, pulse 80, temperature 97.6 F (36.4 C), temperature source Oral, height 5\' 8"  (1.727 m), weight 250 lb (113.4 kg), SpO2 97 %. Body mass index is 38.01 kg/m. Physical Exam Vitals signs reviewed.  Constitutional:      Appearance: Normal appearance. He is obese.  Cardiovascular:     Rate and Rhythm: Normal rate.  Pulmonary:     Effort: Pulmonary effort is normal.  Musculoskeletal: Normal range of motion.  Skin:    General: Skin is warm and dry.  Neurological:     Mental Status: He is alert and oriented to person, place, and time.  Psychiatric:        Mood and Affect: Mood normal.     RECENT LABS AND TESTS: BMET    Component Value Date/Time   NA 138 03/30/2018 1016   K 4.5 03/30/2018 1016   CL 102 03/30/2018 1016   CO2 22 03/30/2018 1016   GLUCOSE 90 03/30/2018 1016   GLUCOSE 87 08/14/2015 1550   BUN 18 03/30/2018 1016   CREATININE 1.17 03/30/2018 1016   CREATININE 1.16 08/14/2015 1550   CALCIUM 8.8 03/30/2018 1016   GFRNONAA 78 03/30/2018 1016   GFRNONAA 49 (L) 07/12/2015 0906   GFRAA 90 03/30/2018 1016   GFRAA 57 (L) 07/12/2015 0906   Lab Results  Component Value Date   HGBA1C 5.7 (H) 03/30/2018   HGBA1C 5.2 09/13/2014   Lab Results  Component Value Date   INSULIN 11.6 03/30/2018   CBC    Component Value Date/Time   WBC 4.8 03/30/2018 1016   WBC 5.4 08/14/2015 1550   RBC 4.61 03/30/2018 1016   RBC 3.95 (L) 08/14/2015 1550   HGB 13.7 03/30/2018 1016   HCT 40.2 03/30/2018 1016   PLT 145 (L) 12/08/2017 0944   MCV 87 03/30/2018 1016   MCV 90 07/14/2011 1441   MCH 29.7 03/30/2018 1016   MCH 28.1 08/14/2015 1550   MCHC 34.1 03/30/2018 1016   MCHC 32.4 08/14/2015 1550   RDW 12.6 03/30/2018 1016   RDW  12.8 07/14/2011 1441   LYMPHSABS 1.8 03/30/2018 1016   LYMPHSABS 1.8 07/14/2011 1441   MONOABS 0.4 06/29/2015 0447   MONOABS 0.3 07/14/2011 1441   EOSABS 0.1 03/30/2018 1016   EOSABS 0.1 07/14/2011 1441   BASOSABS 0.0 03/30/2018 1016   BASOSABS 0.0 07/14/2011 1441   Iron/TIBC/Ferritin/ %Sat No results found for: IRON, TIBC, FERRITIN, IRONPCTSAT Lipid Panel     Component Value Date/Time   CHOL 200 (H) 03/30/2018 1016   TRIG 146 03/30/2018 1016   HDL 29 (L) 03/30/2018 1016   CHOLHDL 6.4 (H) 12/08/2017 0944   CHOLHDL 11.7 (H) 03/12/2015 1633   VLDL 75 (H) 03/12/2015 1633   LDLCALC 142 (H) 03/30/2018 1016   Hepatic Function Panel     Component Value Date/Time   PROT 6.4 03/30/2018 1016   ALBUMIN 4.4 03/30/2018 1016   AST 25 03/30/2018 1016   ALT 47 (H) 03/30/2018 1016   ALKPHOS 61 03/30/2018 1016   BILITOT 0.8 03/30/2018 1016   BILIDIR 0.1 04/21/2015 1712   IBILI 0.8 04/21/2015 1712      Component Value Date/Time   TSH 3.240 03/30/2018 1016   TSH 2.120 12/08/2017 0944   TSH 1.053 06/24/2015 1347   TSH 1.105 03/12/2015 1633     Ref. Range 03/30/2018 10:16  Vitamin D, 25-Hydroxy Latest Ref Range: 30.0 - 100.0 ng/mL 25.2 (L)     OBESITY BEHAVIORAL INTERVENTION VISIT  Today's visit  was # 7   Starting weight: 248 lbs Starting date: 03/30/2018 Today's weight :: 250 lbs Today's date: 07/18/2018 Total lbs lost to date: 0 At least 15 minutes were spent on discussing the following behavioral intervention visit.   ASK: We discussed the diagnosis of obesity with [name] today and Stehen agreed to give [place] permission to discuss obesity behavioral modification therapy today.  ASSESS: Jawan has the diagnosis of obesity and his BMI today is 38.02 Rhythm is in the action stage of change   ADVISE: Nassim was educated on the multiple health risks of obesity as well as the benefit of weight loss to improve his health. He was advised of the need for long term  treatment and the importance of lifestyle modifications to improve his current health and to decrease his risk of future health problems.  AGREE: Multiple dietary modification options and treatment options were discussed and  Sankalp agreed to follow the recommendations documented in the above note.  ARRANGE: Cline was educated on the importance of frequent visits to treat obesity as outlined per CMS and USPSTF guidelines and agreed to schedule his next follow up appointment today.  I, Tammy Wysor, am acting as Energy manager for [place], FNP-C.  I have reviewed the above documentation for accuracy and completeness, and I agree with the above.  -  , FNP-C.

## 2018-07-19 LAB — COMPREHENSIVE METABOLIC PANEL
ALT: 74 IU/L — ABNORMAL HIGH (ref 0–44)
AST: 27 IU/L (ref 0–40)
Albumin/Globulin Ratio: 2.3 — ABNORMAL HIGH (ref 1.2–2.2)
Albumin: 4.6 g/dL (ref 4.0–5.0)
Alkaline Phosphatase: 56 IU/L (ref 39–117)
BUN/Creatinine Ratio: 17 (ref 9–20)
BUN: 18 mg/dL (ref 6–24)
Bilirubin Total: 0.7 mg/dL (ref 0.0–1.2)
CO2: 22 mmol/L (ref 20–29)
Calcium: 9.4 mg/dL (ref 8.7–10.2)
Chloride: 104 mmol/L (ref 96–106)
Creatinine, Ser: 1.07 mg/dL (ref 0.76–1.27)
GFR calc Af Amer: 100 mL/min/{1.73_m2} (ref >59)
GFR calc non Af Amer: 86 mL/min/{1.73_m2} (ref >59)
Globulin, Total: 2 g/dL (ref 1.5–4.5)
Glucose: 101 mg/dL — ABNORMAL HIGH (ref 65–99)
Potassium: 4.5 mmol/L (ref 3.5–5.2)
Sodium: 142 mmol/L (ref 134–144)
Total Protein: 6.6 g/dL (ref 6.0–8.5)

## 2018-07-19 LAB — LIPID PANEL WITH LDL/HDL RATIO
Cholesterol, Total: 209 mg/dL — ABNORMAL HIGH (ref 100–199)
HDL: 28 mg/dL — ABNORMAL LOW (ref >39)
LDL Calculated: 127 mg/dL — ABNORMAL HIGH (ref 0–99)
LDl/HDL Ratio: 4.5 ratio — ABNORMAL HIGH (ref 0.0–3.6)
Triglycerides: 272 mg/dL — ABNORMAL HIGH (ref 0–149)
VLDL Cholesterol Cal: 54 mg/dL — ABNORMAL HIGH (ref 5–40)

## 2018-07-19 LAB — INSULIN, RANDOM: INSULIN: 30.6 u[IU]/mL — ABNORMAL HIGH (ref 2.6–24.9)

## 2018-07-19 LAB — VITAMIN D 25 HYDROXY (VIT D DEFICIENCY, FRACTURES): Vit D, 25-Hydroxy: 27.6 ng/mL — ABNORMAL LOW (ref 30.0–100.0)

## 2018-07-19 LAB — HEMOGLOBIN A1C
Est. average glucose Bld gHb Est-mCnc: 120 mg/dL
Hgb A1c MFr Bld: 5.8 % — ABNORMAL HIGH (ref 4.8–5.6)

## 2018-07-20 NOTE — Telephone Encounter (Signed)
Patient is very upset.  He said he doesn't know why he has to come in for an appt with the doctor when he has an appt scheduled in November.

## 2018-07-20 NOTE — Telephone Encounter (Signed)
Pt called in again about these refills.  Pt states he does want or feel like he needs to come in every time he needs a refill on these meds. He also expressed that he would like to speak to a practice admin.  He doent understand why it take so long for a refill.  I have explain to him that he have to come in every so often for refills on his meds.

## 2018-07-24 NOTE — Telephone Encounter (Signed)
Spoke with pt this Morning and he is very upset about him and his wife treatment at this office. He states he has been having trouble getting an appointment and medications refilled.

## 2018-07-25 ENCOUNTER — Other Ambulatory Visit: Payer: Self-pay | Admitting: Family Medicine

## 2018-07-25 DIAGNOSIS — G894 Chronic pain syndrome: Secondary | ICD-10-CM

## 2018-07-25 NOTE — Telephone Encounter (Signed)
Requested medication (s) are due for refill today: yes  Requested medication (s) are on the active medication list: yes  Last refill:  06/24/2018  Future visit scheduled: no  Notes to clinic: not delegated; prescription written per Benjiman Core 03/14/18     Requested Prescriptions  Pending Prescriptions Disp Refills   cyclobenzaprine (FLEXERIL) 10 MG tablet [Pharmacy Med Name: CYCLOBENZAPRINE 10 MG TABLET] 30 tablet 2    Sig: Take 1 tablet (10 mg total) by mouth as needed.     Not Delegated - Analgesics:  Muscle Relaxants Failed - 07/25/2018  3:02 PM      Failed - This refill cannot be delegated      Passed - Valid encounter within last 6 months    Recent Outpatient Visits          1 month ago Influenza   Primary Care at [facility], [place], MD   4 months ago Class 2 obesity without serious comorbidity with body mass index (BMI) of 37.0 to 37.9 in adult, unspecified obesity type   Primary Care at [facility], [place] D, PA-C   7 months ago Annual physical exam   Primary Care at [place], [place] D, PA-C   8 months ago Anal fissure   Primary Care at [place], [place] D, PA-C   9 months ago Chronic pain syndrome   Primary Care at [place], Asencion Partridge, MD      Future Appointments            In 8 months Sagardia, [name], MD Primary Care at [place], [facility]

## 2018-07-25 NOTE — Telephone Encounter (Signed)
Pt requesting refill on medication

## 2018-07-26 ENCOUNTER — Other Ambulatory Visit: Payer: Self-pay

## 2018-07-26 ENCOUNTER — Telehealth: Payer: Self-pay

## 2018-07-26 DIAGNOSIS — K594 Anal spasm: Secondary | ICD-10-CM

## 2018-07-26 NOTE — Telephone Encounter (Signed)
I saw Bruce Morales on 06/20/2018 when he was diagnosed with influenza.  PCP status or transfer of care was not discussed and I certainly did not agree to take over his medication refills.  I do not know this patient well enough to feel comfortable prescribing medications at this time.  Thanks.

## 2018-07-26 NOTE — Telephone Encounter (Signed)
Good afternoon Dr. Alvy Bimler,  Patient is requesting refill of Cardizem for anal pain.  States he saw you on 01/21 and had his care transferred to you.  Patient states all of medication was reviewed at that visit and he thought you would be taking over refills.  Previous patient of [place].  Cardizem PO has been pended with 2 refills.  Pt has appt scheduled 04/2019 and states he was told that this is the first available.  I will be asking scheduling to reach out to him to set up earlier appointment.   Thank you,  Doyne Keel

## 2018-07-27 NOTE — Telephone Encounter (Signed)
Pt advised that he would need appt to establish care with provider as he was seen for acute complaint only.  Appt scheduled.

## 2018-08-08 ENCOUNTER — Ambulatory Visit (INDEPENDENT_AMBULATORY_CARE_PROVIDER_SITE_OTHER): Payer: Medicare HMO | Admitting: Psychology

## 2018-08-08 ENCOUNTER — Ambulatory Visit (INDEPENDENT_AMBULATORY_CARE_PROVIDER_SITE_OTHER): Payer: Medicare HMO | Admitting: Family Medicine

## 2018-08-09 ENCOUNTER — Encounter: Payer: Self-pay | Admitting: Emergency Medicine

## 2018-08-09 ENCOUNTER — Other Ambulatory Visit: Payer: Self-pay

## 2018-08-09 ENCOUNTER — Ambulatory Visit (INDEPENDENT_AMBULATORY_CARE_PROVIDER_SITE_OTHER): Payer: Medicare HMO | Admitting: Emergency Medicine

## 2018-08-09 VITALS — BP 119/78 | HR 79 | Temp 98.9°F | Resp 18 | Ht 68.0 in | Wt 254.4 lb

## 2018-08-09 DIAGNOSIS — E559 Vitamin D deficiency, unspecified: Secondary | ICD-10-CM

## 2018-08-09 DIAGNOSIS — R7303 Prediabetes: Secondary | ICD-10-CM

## 2018-08-09 DIAGNOSIS — K594 Anal spasm: Secondary | ICD-10-CM

## 2018-08-09 DIAGNOSIS — G894 Chronic pain syndrome: Secondary | ICD-10-CM | POA: Diagnosis not present

## 2018-08-09 DIAGNOSIS — E7849 Other hyperlipidemia: Secondary | ICD-10-CM | POA: Diagnosis not present

## 2018-08-09 DIAGNOSIS — Z7689 Persons encountering health services in other specified circumstances: Secondary | ICD-10-CM

## 2018-08-09 MED ORDER — DUEXIS 800-26.6 MG PO TABS: 1.0000 | ORAL_TABLET | Freq: Three times a day (TID) | ORAL | 0 refills | Status: DC

## 2018-08-09 MED ORDER — VITAMIN D (ERGOCALCIFEROL) 1.25 MG (50000 UNIT) PO CAPS: 50000.0000 [IU] | ORAL_CAPSULE | ORAL | 0 refills | Status: DC

## 2018-08-09 MED ORDER — DILTIAZEM HCL 30 MG PO TABS: ORAL_TABLET | ORAL | 0 refills | Status: DC

## 2018-08-09 MED ORDER — CYCLOBENZAPRINE HCL 10 MG PO TABS: 10.0000 mg | ORAL_TABLET | ORAL | 2 refills | Status: DC | PRN

## 2018-08-09 MED ORDER — DILTIAZEM HCL 30 MG PO TABS: ORAL_TABLET | ORAL | 1 refills | Status: DC

## 2018-08-09 NOTE — Progress Notes (Signed)
[name] 42 y.o.   Chief Complaint  Patient presents with  . Medication Refill    discuss   . Establish Care    HISTORY OF PRESENT ILLNESS: This is a 43 y.o. male here to establish care.  Also needs medication refills.  Patient has history of back surgeries and chronic back pain.  Sees Dr. Ollen Bowl a pain management center.  Taking Nucynta.  Also has a history of intermittent rectal pain. Patient Active Problem List   Diagnosis Date Noted  . Other hyperlipidemia 07/18/2018  . Depression 07/18/2018  . Prediabetes 05/17/2018  . Vitamin D deficiency 05/17/2018  . Hearing loss 09/04/2015  . Bone marrow suppression   . Thrombocytopenia (HCC)   . Pancytopenia (HCC) 06/24/2015  . Status post lumbar laminectomy 06/10/2015  . Class 2 severe obesity with serious comorbidity and body mass index (BMI) of 38.0 to 38.9 in adult (HCC) 11/13/2014  . Bilateral low back pain without sciatica 04/14/2014     HPI   Prior to Admission medications   Medication Sig Start Date End Date Taking? Authorizing Provider  benzonatate (TESSALON) 200 MG capsule Take 1 capsule (200 mg total) by mouth 2 (two) times daily as needed for cough. 06/20/18  Yes Sharri Loya, [name], MD  cyclobenzaprine (FLEXERIL) 10 MG tablet TAKE 1 TABLET (10 MG TOTAL) BY MOUTH AS NEEDED. 07/25/18  Yes Myles Lipps, MD  diltiazem (CARDIZEM) 30 MG tablet TAKE 1 TABLET BY MOUTH AS NEEDED FOR RECTAL PAIN. 06/09/18  Yes Myles Lipps, MD  diltiazem 2 % GEL Apply a pea-sized amount to the affected area 3 times daily. 11/15/17  Yes Barnett Abu, [place] D, PA-C  DUEXIS 800-26.6 MG TABS Take 1 tablet by mouth 3 (three) times daily. 08/15/17  Yes [provider]  NUCYNTA 75 MG tablet TAKE 1 TABLET BY MOUTH EVERY 6 TO 8 HOURS AS NEEDED 02/22/18  Yes [provider]  promethazine-dextromethorphan (PROMETHAZINE-DM) 6.25-15 MG/5ML syrup Take 5 mLs by mouth 4 (four) times daily as needed for cough. 06/20/18  Yes Rocket Gunderson,  [name], MD  Vitamin D, Ergocalciferol, (DRISDOL) 1.25 MG (50000 UT) CAPS capsule Take 1 capsule (50,000 Units total) by mouth every 7 (seven) days. 07/18/18  Yes Whitmire, Thermon Leyland, FNP    Allergies  Allergen Reactions  . Hydrocodone Hives and Itching  . Oxycodone     Mild Itching, patient can tolerate oxycodone  . Percocet [Oxycodone-Acetaminophen]     Itching, patient says he tolerates    Patient Active Problem List   Diagnosis Date Noted  . Other hyperlipidemia 07/18/2018  . Depression 07/18/2018  . Prediabetes 05/17/2018  . Vitamin D deficiency 05/17/2018  . Hearing loss 09/04/2015  . Bone marrow suppression   . Thrombocytopenia (HCC)   . Pancytopenia (HCC) 06/24/2015  . Status post lumbar laminectomy 06/10/2015  . Class 2 severe obesity with serious comorbidity and body mass index (BMI) of 38.0 to 38.9 in adult (HCC) 11/13/2014  . Bilateral low back pain without sciatica 04/14/2014    Past Medical History:  Diagnosis Date  . Anginal pain (HCC) 04/15/2014  . Arthritis    "wrists, knees, lower back" (04/16/2014)  . Carpal tunnel syndrome   . Chronic bronchitis (HCC)    "probably get it q yr"  . Chronic lower back pain   . Chronic lower back pain   . Constipation   . Gall stones   . GERD (gastroesophageal reflux disease)   . GERD (gastroesophageal reflux disease)   . HLD (  hyperlipidemia)   . Joint pain   . Meningitis   . Plantar fasciitis   . Right knee pain   . Sciatica     Past Surgical History:  Procedure Laterality Date  . CARPAL TUNNEL RELEASE     02/2018 Right arm  . CHOLECYSTECTOMY  2011  . ESOPHAGOGASTRODUODENOSCOPY N/A 02/27/2013   Procedure: ESOPHAGOGASTRODUODENOSCOPY (EGD);  Surgeon: Florencia Reasonsobert V Buccini, MD;  Location: [facility];  Service: Endoscopy;  Laterality: N/A;  . KNEE ARTHROSCOPY Right 2013 X 2  . KNEE ARTHROSCOPY Right 02/22/2018  . LAMINECTOMY AND MICRODISCECTOMY SPINE    . LEFT HEART CATHETERIZATION WITH CORONARY ANGIOGRAM N/A  04/17/2014   Procedure: LEFT HEART CATHETERIZATION WITH CORONARY ANGIOGRAM;  Surgeon: Ricki RodriguezAjay S Kadakia, MD;  Location: [facility];  Service: Cardiovascular;  Laterality: N/A;  . RADIOLOGY WITH ANESTHESIA N/A 06/11/2015   Procedure: MRI LUMBAR WITH AND WITHOUT CONTRAST;  Surgeon: Medication Radiologist, MD;  Location: [facility];  Service: Radiology;  Laterality: N/A;    Social History   Socioeconomic History  . Marital status: Married    Spouse name: Janelle Flooraomi   . Number of children: 2  . Years of education: Not on file  . Highest education level: Not on file  Occupational History  . Occupation: Disabled  Social Needs  . Financial resource strain: Not on file  . Food insecurity:    Worry: Not on file    Inability: Not on file  . Transportation needs:    Medical: Not on file    Non-medical: Not on file  Tobacco Use  . Smoking status: Former Smoker    Packs/day: 0.50    Years: 27.00    Pack years: 13.50    Types: Cigarettes    Last attempt to quit: 03/11/2015    Years since quitting: 3.4  . Smokeless tobacco: Former Engineer, waterUser  Substance and Sexual Activity  . Alcohol use: Yes    Alcohol/week: 0.0 standard drinks    Comment: 04/16/2014 "a 6 pack will last me a month"  . Drug use: No    Comment: 04/16/2014 "in my teens"  . Sexual activity: Yes  Lifestyle  . Physical activity:    Days per week: Not on file    Minutes per session: Not on file  . Stress: Not on file  Relationships  . Social connections:    Talks on phone: Not on file    Gets together: Not on file    Attends religious service: Not on file    Active member of club or organization: Not on file    Attends meetings of clubs or organizations: Not on file    Relationship status: Not on file  . Intimate partner violence:    Fear of current or ex partner: Not on file    Emotionally abused: Not on file    Physically abused: Not on file    Forced sexual activity: Not on file  Other Topics Concern  . Not on file  Social  History Narrative   Drinks 2 cups of coffee in the morning.    Family History  Problem Relation Age of Onset  . Multiple sclerosis Mother   . Obesity Mother   . Heart failure Maternal Grandfather   . Diabetes Father   . Hyperlipidemia Father   . Sleep apnea Father   . Obesity Father   . Fibromyalgia Sister   . Schizophrenia Brother   . Stroke Maternal Grandmother   . Diabetes Paternal Grandmother  Review of Systems  Constitutional: Negative.  Negative for chills and fever.  HENT: Negative.   Eyes: Negative.   Respiratory: Negative.  Negative for shortness of breath.   Cardiovascular: Negative.  Negative for chest pain and palpitations.  Gastrointestinal: Negative for blood in stool.       Intermittent rectal pain  Genitourinary: Negative.  Negative for dysuria.  Musculoskeletal: Positive for back pain.  Skin: Negative.  Negative for rash.  Neurological: Negative.  Negative for dizziness and headaches.  Endo/Heme/Allergies: Negative.   All other systems reviewed and are negative.  Vitals:   08/09/18 0916  BP: 119/78  Pulse: 79  Resp: 18  Temp: 98.9 F (37.2 C)  SpO2: 95%     Physical Exam Vitals signs reviewed.  Constitutional:      Appearance: He is obese.  HENT:     Head: Normocephalic and atraumatic.  Eyes:     Extraocular Movements: Extraocular movements intact.     Conjunctiva/sclera: Conjunctivae normal.     Pupils: Pupils are equal, round, and reactive to light.  Neck:     Musculoskeletal: Normal range of motion and neck supple.  Cardiovascular:     Rate and Rhythm: Normal rate and regular rhythm.     Heart sounds: Normal heart sounds.  Pulmonary:     Effort: Pulmonary effort is normal.     Breath sounds: Normal breath sounds.  Musculoskeletal: Normal range of motion.  Skin:    General: Skin is warm and dry.  Neurological:     General: No focal deficit present.     Mental Status: He is alert and oriented to person, place, and time.       ASSESSMENT & PLAN: Burlin was seen today for medication refill and establish care.  Diagnoses and all orders for this visit:  Proctalgia fugax -     Discontinue: diltiazem (CARDIZEM) 30 MG tablet; TAKE 1 TABLET BY MOUTH AS NEEDED FOR RECTAL PAIN. -     diltiazem (CARDIZEM) 30 MG tablet; TAKE 1 TABLET BY MOUTH AS NEEDED FOR RECTAL PAIN.  Prediabetes  Chronic pain syndrome -     cyclobenzaprine (FLEXERIL) 10 MG tablet; Take 1 tablet (10 mg total) by mouth as needed.  Other hyperlipidemia  Vitamin D deficiency -     Vitamin D, Ergocalciferol, (DRISDOL) 1.25 MG (50000 UT) CAPS capsule; Take 1 capsule (50,000 Units total) by mouth every 7 (seven) days.  Encounter to establish care  Other orders -     DUEXIS 800-26.6 MG TABS; Take 1 tablet by mouth 3 (three) times daily.    Patient Instructions       If you have lab work done today you will be contacted with your lab results within the next 2 weeks.  If you have not heard from [place] then please contact us. The fastest way to get your results is to register for My Chart.   IF you received an x-ray today, you will receive an invoice from [facility]. Please contact [facility] at [phone] with questions or concerns regarding your invoice.   IF you received labwork today, you will receive an invoice from [facility]. Please contact [facility] at [phone] with questions or concerns regarding your invoice.   Our billing staff will not be able to assist you with questions regarding bills from these companies.  You will be contacted with the lab results as soon as they are available. The fastest way to get your results is to activate your My Chart account. Instructions  are located on the last page of this paperwork. If you have not heard from [place] regarding the results in 2 weeks, please contact this office.     Health Maintenance, Male A healthy lifestyle and preventive care is important for your health and  wellness. Ask your health care provider about what schedule of regular examinations is right for you. What should I know about weight and diet? Eat a Healthy Diet  Eat plenty of vegetables, fruits, whole grains, low-fat dairy products, and lean protein.  Do not eat a lot of foods high in solid fats, added sugars, or salt.  Maintain a Healthy Weight Regular exercise can help you achieve or maintain a healthy weight. You should:  Do at least 150 minutes of exercise each week. The exercise should increase your heart rate and make you sweat (moderate-intensity exercise).  Do strength-training exercises at least twice a week. Watch Your Levels of Cholesterol and Blood Lipids  Have your blood tested for lipids and cholesterol every 5 years starting at 42 years of age. If you are at high risk for heart disease, you should start having your blood tested when you are 41 years old. You may need to have your cholesterol levels checked more often if: ? Your lipid or cholesterol levels are high. ? You are older than 41 years of age. ? You are at high risk for heart disease. What should I know about cancer screening? Many types of cancers can be detected early and may often be prevented. Lung Cancer  You should be screened every year for lung cancer if: ? You are a current smoker who has smoked for at least 30 years. ? You are a former smoker who has quit within the past 15 years.  Talk to your health care provider about your screening options, when you should start screening, and how often you should be screened. Colorectal Cancer  Routine colorectal cancer screening usually begins at 41 years of age and should be repeated every 5-10 years until you are 41 years old. You may need to be screened more often if early forms of precancerous polyps or small growths are found. Your health care provider may recommend screening at an earlier age if you have risk factors for colon cancer.  Your health care  provider may recommend using home test kits to check for hidden blood in the stool.  A small camera at the end of a tube can be used to examine your colon (sigmoidoscopy or colonoscopy). This checks for the earliest forms of colorectal cancer. Prostate and Testicular Cancer  Depending on your age and overall health, your health care provider may do certain tests to screen for prostate and testicular cancer.  Talk to your health care provider about any symptoms or concerns you have about testicular or prostate cancer. Skin Cancer  Check your skin from head to toe regularly.  Tell your health care provider about any new moles or changes in moles, especially if: ? There is a change in a mole's size, shape, or color. ? You have a mole that is larger than a pencil eraser.  Always use sunscreen. Apply sunscreen liberally and repeat throughout the day.  Protect yourself by wearing long sleeves, pants, a wide-brimmed hat, and sunglasses when outside. What should I know about heart disease, diabetes, and high blood pressure?  If you are 54-3 years of age, have your blood pressure checked every 3-5 years. If you are 46 years of age or older, have  your blood pressure checked every year. You should have your blood pressure measured twice-once when you are at a hospital or clinic, and once when you are not at a hospital or clinic. Record the average of the two measurements. To check your blood pressure when you are not at a hospital or clinic, you can use: ? An automated blood pressure machine at a pharmacy. ? A home blood pressure monitor.  Talk to your health care provider about your target blood pressure.  If you are between 11-68 years old, ask your health care provider if you should take aspirin to prevent heart disease.  Have regular diabetes screenings by checking your fasting blood sugar level. ? If you are at a normal weight and have a low risk for diabetes, have this test once every three  years after the age of 21. ? If you are overweight and have a high risk for diabetes, consider being tested at a younger age or more often.  A one-time screening for abdominal aortic aneurysm (AAA) by ultrasound is recommended for men aged 65-75 years who are current or former smokers. What should I know about preventing infection? Hepatitis B If you have a higher risk for hepatitis B, you should be screened for this virus. Talk with your health care provider to find out if you are at risk for hepatitis B infection. Hepatitis C Blood testing is recommended for:  Everyone born from [birth year] through 1963.  Anyone with known risk factors for hepatitis C. Sexually Transmitted Diseases (STDs)  You should be screened each year for STDs including gonorrhea and chlamydia if: ? You are sexually active and are younger than 41 years of age. ? You are older than 41 years of age and your health care provider tells you that you are at risk for this type of infection. ? Your sexual activity has changed since you were last screened and you are at an increased risk for chlamydia or gonorrhea. Ask your health care provider if you are at risk.  Talk with your health care provider about whether you are at high risk of being infected with HIV. Your health care provider may recommend a prescription medicine to help prevent HIV infection. What else can I do?  Schedule regular health, dental, and eye exams.  Stay current with your vaccines (immunizations).  Do not use any tobacco products, such as cigarettes, chewing tobacco, and e-cigarettes. If you need help quitting, ask your health care provider.  Limit alcohol intake to no more than 2 drinks per day. One drink equals 12 ounces of beer, 5 ounces of wine, or 1 ounces of hard liquor.  Do not use street drugs.  Do not share needles.  Ask your health care provider for help if you need support or information about quitting drugs.  Tell your health care  provider if you often feel depressed.  Tell your health care provider if you have ever been abused or do not feel safe at home. This information is not intended to replace advice given to you by your health care provider. Make sure you discuss any questions you have with your health care provider. Document Released: 11/13/2007 Document Revised: 01/14/2016 Document Reviewed: 02/18/2015 Elsevier Interactive Patient Education  2019 Elsevier Inc.      Edwina Barth, MD [facility]

## 2018-08-09 NOTE — Patient Instructions (Addendum)

## 2018-08-15 ENCOUNTER — Telehealth: Payer: Self-pay | Admitting: Emergency Medicine

## 2018-08-15 NOTE — Telephone Encounter (Signed)
Pharmacy should tell us what can we substitute with.  Thanks.

## 2018-08-15 NOTE — Telephone Encounter (Signed)
Is there another medication we can try? Please advise.

## 2018-08-15 NOTE — Telephone Encounter (Signed)
Copied from CRM [id number]. Topic: Quick Communication - Rx Refill/Question >> Aug 15, 2018  9:27 AM Fanny Bien wrote: Medication:DUEXIS 800-26.6 MG TABS [[id number]]  pt called and stated that insurance does not cover medication. Please advise

## 2018-08-15 NOTE — Telephone Encounter (Signed)
Called pharmacy was on hold for 15 minutes will try later.

## 2018-08-22 ENCOUNTER — Other Ambulatory Visit: Payer: Self-pay | Admitting: Physician Assistant

## 2018-08-28 ENCOUNTER — Encounter (INDEPENDENT_AMBULATORY_CARE_PROVIDER_SITE_OTHER): Payer: Self-pay

## 2018-08-31 ENCOUNTER — Other Ambulatory Visit: Payer: Self-pay | Admitting: Emergency Medicine

## 2018-08-31 DIAGNOSIS — E559 Vitamin D deficiency, unspecified: Secondary | ICD-10-CM

## 2018-09-03 ENCOUNTER — Other Ambulatory Visit: Payer: Self-pay | Admitting: Emergency Medicine

## 2018-09-03 DIAGNOSIS — K594 Anal spasm: Secondary | ICD-10-CM

## 2018-09-13 DIAGNOSIS — M961 Postlaminectomy syndrome, not elsewhere classified: Secondary | ICD-10-CM | POA: Diagnosis not present

## 2018-09-27 ENCOUNTER — Other Ambulatory Visit: Payer: Self-pay | Admitting: Emergency Medicine

## 2018-09-27 DIAGNOSIS — K594 Anal spasm: Secondary | ICD-10-CM

## 2018-10-09 ENCOUNTER — Telehealth: Payer: Self-pay | Admitting: Emergency Medicine

## 2018-10-09 NOTE — Telephone Encounter (Signed)
Need patient's address at home insurance number please.  Thanks.

## 2018-10-09 NOTE — Telephone Encounter (Signed)
Copied from CRM #[id number]. Topic: General - Other >> Oct 06, 2018 12:57 PM Jaquita Rector A wrote: Reason for CRM: Hetal with [facility] Prior Auth called to ask questions regarding DUEXIS 800-26.6 MG TABS. Please call back at Ph# [phone] Ref# [id number]

## 2018-10-16 ENCOUNTER — Telehealth: Payer: Self-pay | Admitting: Emergency Medicine

## 2018-10-16 NOTE — Telephone Encounter (Signed)
Patient was denied the medication Duexis by his insurance company. In order for this drug to be approved, the patient has to try and fail the following medications celecoxib cap, diclofenac potassium tab, diclofenac sodium DR tab, diclofenac sodium ER tab, etodolac cap/tab, flurbiprofen tab, meloxicam tab

## 2018-10-23 ENCOUNTER — Other Ambulatory Visit: Payer: Self-pay | Admitting: Emergency Medicine

## 2018-10-23 DIAGNOSIS — K594 Anal spasm: Secondary | ICD-10-CM

## 2018-11-15 ENCOUNTER — Other Ambulatory Visit: Payer: Self-pay | Admitting: Emergency Medicine

## 2018-11-15 DIAGNOSIS — K594 Anal spasm: Secondary | ICD-10-CM

## 2018-11-16 ENCOUNTER — Other Ambulatory Visit: Payer: Self-pay | Admitting: Emergency Medicine

## 2018-11-16 DIAGNOSIS — E559 Vitamin D deficiency, unspecified: Secondary | ICD-10-CM

## 2018-11-16 NOTE — Telephone Encounter (Signed)
Forwarding medication refill to PCP for review. 

## 2018-12-06 DIAGNOSIS — M961 Postlaminectomy syndrome, not elsewhere classified: Secondary | ICD-10-CM | POA: Diagnosis not present

## 2018-12-08 ENCOUNTER — Other Ambulatory Visit: Payer: Self-pay | Admitting: Emergency Medicine

## 2018-12-08 DIAGNOSIS — K594 Anal spasm: Secondary | ICD-10-CM

## 2018-12-24 ENCOUNTER — Other Ambulatory Visit: Payer: Self-pay | Admitting: Emergency Medicine

## 2018-12-24 DIAGNOSIS — K594 Anal spasm: Secondary | ICD-10-CM

## 2018-12-25 NOTE — Telephone Encounter (Signed)
Requested medication (s) are due for refill today:   Looks like PRN for rectal pain  Requested medication (s) are on the active medication list:   Yes  Future visit scheduled:   Yes  With Sagardia   Last ordered: 12/08/2018  #20  0 refills Forwarded to provider for further disposition   Requested Prescriptions  Pending Prescriptions Disp Refills   diltiazem (CARDIZEM) 30 MG tablet [Pharmacy Med Name: DILTIAZEM 30 MG TABLET] 20 tablet 0    Sig: TAKE 1 TABLET BY MOUTH AS NEEDED FOR RECTAL PAIN     Cardiovascular:  Calcium Channel Blockers Passed - 12/24/2018  5:07 PM      Passed - Last BP in normal range    BP Readings from Last 1 Encounters:  08/09/18 119/78         Passed - Valid encounter within last 6 months    Recent Outpatient Visits          4 months ago Proctalgia fugax   [facility] at [place], [place], MD   6 months ago Influenza   [facility] at [place], [place], MD   9 months ago Class 2 obesity without serious comorbidity with body mass index (BMI) of 37.0 to 37.9 in adult, unspecified obesity type   [facility] at [place], [place] D, PA-C   1 year ago Annual physical exam   [facility] at [place], [place] D, PA-C   1 year ago Anal fissure   [facility] at [facility], Reather Laurence, PA-C      Future Appointments            In 3 months Sagardia, Ines Bloomer, MD Primary Care at [place], [facility]

## 2018-12-26 DIAGNOSIS — M79671 Pain in right foot: Secondary | ICD-10-CM | POA: Diagnosis not present

## 2018-12-26 DIAGNOSIS — M1711 Unilateral primary osteoarthritis, right knee: Secondary | ICD-10-CM | POA: Diagnosis not present

## 2019-01-02 ENCOUNTER — Other Ambulatory Visit: Payer: Self-pay | Admitting: Emergency Medicine

## 2019-01-02 DIAGNOSIS — E559 Vitamin D deficiency, unspecified: Secondary | ICD-10-CM

## 2019-01-02 NOTE — Telephone Encounter (Signed)
Requested medications are due for refill today?  Yes  Requested medications are on the active medication list?  Yes  Last refill - 11/16/2018  Future visit scheduled?  Yes- 04/04/2019  Notes to clinic   Requested Prescriptions  Pending Prescriptions Disp Refills   Vitamin D, Ergocalciferol, (DRISDOL) 1.25 MG (50000 UT) CAPS capsule [Pharmacy Med Name: VITAMIN D2 1.25MG (50,000 UNIT)] 7 capsule 0    Sig: TAKE 1 CAPSULE BY MOUTH ONE TIME PER WEEK     Endocrinology:  Vitamins - Vitamin D Supplementation Failed - 01/02/2019  1:24 AM      Failed - 50,000 IU strengths are not delegated      Failed - Phosphate in normal range and within 360 days    Phosphorus  Date Value Ref Range Status  06/24/2015 4.8 (H) 2.5 - 4.6 mg/dL Final         Failed - Vitamin D in normal range and within 360 days    Vit D, 25-Hydroxy  Date Value Ref Range Status  07/18/2018 27.6 (L) 30.0 - 100.0 ng/mL Final    Comment:    Vitamin D deficiency has been defined by the Institute of Medicine and an Endocrine Society practice guideline as a level of serum 25-OH vitamin D less than 20 ng/mL (1,2). The Endocrine Society went on to further define vitamin D insufficiency as a level between 21 and 29 ng/mL (2). 1. IOM (Institute of Medicine). 2010. Dietary reference    intakes for calcium and D. [place]: The    [employer]. 2. Holick MF, Binkley Nicholson, Bischoff-Ferrari HA, et al.    Evaluation, treatment, and prevention of vitamin D    deficiency: an Endocrine Society clinical practice    guideline. JCEM. 2011 Jul; 96(7):1911-30.          Passed - Ca in normal range and within 360 days    Calcium  Date Value Ref Range Status  07/18/2018 9.4 8.7 - 10.2 mg/dL Final         Passed - Valid encounter within last 12 months    Recent Outpatient Visits          4 months ago Proctalgia fugax   [facility] at [place], [place], MD   6 months ago Influenza   [facility] Care at [facility], [place], MD   9 months ago Class 2 obesity without serious comorbidity with body mass index (BMI) of 37.0 to 37.9 in adult, unspecified obesity type   Primary [facility] at [place], [place] D, PA-C   1 year ago Annual physical exam   [facility] at [place], [place] D, PA-C   1 year ago Anal fissure   Primary Care at [facility], Reather Laurence, PA-C      Future Appointments            In 3 months Sagardia, Ines Bloomer, MD Primary Care at [place], [facility]

## 2019-01-12 ENCOUNTER — Other Ambulatory Visit: Payer: Self-pay | Admitting: Emergency Medicine

## 2019-01-12 DIAGNOSIS — K594 Anal spasm: Secondary | ICD-10-CM

## 2019-01-12 NOTE — Telephone Encounter (Signed)
Requested medications are due for refill today?  Yes  Requested medications are on the active medication list?  Yes  Last refill-12/25/2018- #20, 0 Refills  Future visit scheduled?  Yes-04/04/2019  Notes to clinic- Unsure if this is a maintenance medication as patient was given #20 on last visit, 0 refills.  Will send to PCP refill pool to address.  Requested Prescriptions  Pending Prescriptions Disp Refills   diltiazem (CARDIZEM) 30 MG tablet [Pharmacy Med Name: DILTIAZEM 30 MG TABLET] 20 tablet 0    Sig: TAKE 1 TABLET BY MOUTH AS NEEDED FOR RECTAL PAIN     Cardiovascular:  Calcium Channel Blockers Passed - 01/12/2019  9:16 AM      Passed - Last BP in normal range    BP Readings from Last 1 Encounters:  08/09/18 119/78         Passed - Valid encounter within last 6 months    Recent Outpatient Visits          5 months ago Proctalgia fugax   [facility] at [place], [place], MD   6 months ago Influenza   [facility] at [place], [place], MD   10 months ago Class 2 obesity without serious comorbidity with body mass index (BMI) of 37.0 to 37.9 in adult, unspecified obesity type   [facility] at [place], [place] D, PA-C   1 year ago Annual physical exam   [facility] at [place], [place] D, PA-C   1 year ago Anal fissure   [facility] Care at [facility], Reather Laurence, PA-C      Future Appointments            In 2 months Sagardia, Ines Bloomer, MD Primary Care at [place], [facility]

## 2019-01-14 NOTE — Telephone Encounter (Signed)
Patient is requesting a refill of the following medications: Requested Prescriptions   Pending Prescriptions Disp Refills  . diltiazem (CARDIZEM) 30 MG tablet [Pharmacy Med Name: DILTIAZEM 30 MG TABLET] 20 tablet 0    Sig: TAKE 1 TABLET BY MOUTH AS NEEDED FOR RECTAL PAIN    Date of patient request: 01/12/2019 Last office visit: 08/09/2018 Date of last refill: 12/25/2018 Last refill amount: 20 Follow up time period per chart: n/a

## 2019-01-17 ENCOUNTER — Telehealth: Payer: Self-pay | Admitting: Emergency Medicine

## 2019-01-17 ENCOUNTER — Other Ambulatory Visit: Payer: Self-pay | Admitting: Orthopedic Surgery

## 2019-01-17 DIAGNOSIS — M79671 Pain in right foot: Secondary | ICD-10-CM

## 2019-01-17 NOTE — Telephone Encounter (Signed)
Pt would like a refill on his cyclobenzaprine (FLEXERIL) 10 MG tablet [[id number]] Pharmacy:  [id number] - [place], [address]. AT [address]. Please advise pt at  [phone]

## 2019-01-18 NOTE — Telephone Encounter (Signed)
No

## 2019-01-18 NOTE — Telephone Encounter (Signed)
Is it ok to refill this flexeril 10 mg. Patient have not been seen since March

## 2019-01-31 ENCOUNTER — Other Ambulatory Visit: Payer: Self-pay

## 2019-01-31 ENCOUNTER — Other Ambulatory Visit: Payer: Self-pay | Admitting: Emergency Medicine

## 2019-01-31 ENCOUNTER — Ambulatory Visit
Admission: RE | Admit: 2019-01-31 | Discharge: 2019-01-31 | Disposition: A | Discharge status: Home / Self Care | Payer: Medicare HMO | Source: Ambulatory Visit | Attending: Orthopedic Surgery | Admitting: Orthopedic Surgery

## 2019-01-31 DIAGNOSIS — M79671 Pain in right foot: Secondary | ICD-10-CM

## 2019-01-31 DIAGNOSIS — G894 Chronic pain syndrome: Secondary | ICD-10-CM

## 2019-01-31 DIAGNOSIS — S9031XA Contusion of right foot, initial encounter: Secondary | ICD-10-CM | POA: Diagnosis not present

## 2019-01-31 DIAGNOSIS — K594 Anal spasm: Secondary | ICD-10-CM

## 2019-01-31 NOTE — Telephone Encounter (Signed)
Requested medication (s) are due for refill today: yes Requested medication (s) are on the active medication list: yes  Last refill: 12/19/2018  Future visit scheduled: yes  Notes to clinic:  This refill cannot be delegated   Requested Prescriptions  Pending Prescriptions Disp Refills   cyclobenzaprine (FLEXERIL) 10 MG tablet [Pharmacy Med Name: CYCLOBENZAPRINE 10 MG TABLET] 30 tablet 2    Sig: Take 1 tablet (10 mg total) by mouth as needed.     Not Delegated - Analgesics:  Muscle Relaxants Failed - 01/31/2019 10:24 AM      Failed - This refill cannot be delegated      Passed - Valid encounter within last 6 months    Recent Outpatient Visits          5 months ago Proctalgia fugax   [facility] at [place], [place], MD   7 months ago Influenza   [facility] at [place], [place], MD   10 months ago Class 2 obesity without serious comorbidity with body mass index (BMI) of 37.0 to 37.9 in adult, unspecified obesity type   Primary Care at [place], [place] D, PA-C   1 year ago Annual physical exam   [facility] at [place], [place] D, PA-C   1 year ago Anal fissure   Primary Care at [facility], Reather Laurence, PA-C      Future Appointments            In 2 months Sagardia, Ines Bloomer, MD Primary Care at [place], [facility]

## 2019-01-31 NOTE — Telephone Encounter (Signed)
Requested medication (s) are due for refill today: yes  Requested medication (s) are on the active medication list: yes  Last refill:  01/15/19  Future visit scheduled: no  Notes to clinic: review for refill   Requested Prescriptions  Pending Prescriptions Disp Refills   diltiazem (CARDIZEM) 30 MG tablet [Pharmacy Med Name: DILTIAZEM 30 MG TABLET] 20 tablet 0    Sig: TAKE 1 TABLET BY MOUTH AS NEEDED FOR RECTAL PAIN     Cardiovascular:  Calcium Channel Blockers Passed - 01/31/2019  9:06 AM      Passed - Last BP in normal range    BP Readings from Last 1 Encounters:  08/09/18 119/78         Passed - Valid encounter within last 6 months    Recent Outpatient Visits          5 months ago Proctalgia fugax   [facility] at [place], [place], MD   7 months ago Influenza   [facility] at [place], [place], MD   10 months ago Class 2 obesity without serious comorbidity with body mass index (BMI) of 37.0 to 37.9 in adult, unspecified obesity type   [facility] at [place], [place] D, PA-C   1 year ago Annual physical exam   [facility] at [place], [place] D, PA-C   1 year ago Anal fissure   Primary Care at [facility], Reather Laurence, PA-C      Future Appointments            In 2 months Sagardia, Ines Bloomer, MD Primary Care at [place], [facility]

## 2019-02-01 DIAGNOSIS — M79671 Pain in right foot: Secondary | ICD-10-CM | POA: Diagnosis not present

## 2019-02-01 DIAGNOSIS — M1711 Unilateral primary osteoarthritis, right knee: Secondary | ICD-10-CM | POA: Diagnosis not present

## 2019-02-24 ENCOUNTER — Other Ambulatory Visit: Payer: Self-pay | Admitting: Emergency Medicine

## 2019-02-24 DIAGNOSIS — K594 Anal spasm: Secondary | ICD-10-CM

## 2019-03-07 DIAGNOSIS — Z79891 Long term (current) use of opiate analgesic: Secondary | ICD-10-CM | POA: Diagnosis not present

## 2019-03-07 DIAGNOSIS — M961 Postlaminectomy syndrome, not elsewhere classified: Secondary | ICD-10-CM | POA: Diagnosis not present

## 2019-03-13 ENCOUNTER — Telehealth (INDEPENDENT_AMBULATORY_CARE_PROVIDER_SITE_OTHER): Payer: Medicare HMO | Admitting: Emergency Medicine

## 2019-03-13 ENCOUNTER — Other Ambulatory Visit: Payer: Self-pay

## 2019-03-13 ENCOUNTER — Encounter: Payer: Self-pay | Admitting: Emergency Medicine

## 2019-03-13 VITALS — Ht 69.0 in | Wt 250.0 lb

## 2019-03-13 DIAGNOSIS — R6889 Other general symptoms and signs: Secondary | ICD-10-CM

## 2019-03-13 DIAGNOSIS — Z20828 Contact with and (suspected) exposure to other viral communicable diseases: Secondary | ICD-10-CM

## 2019-03-13 DIAGNOSIS — Z20822 Contact with and (suspected) exposure to covid-19: Secondary | ICD-10-CM

## 2019-03-13 NOTE — Progress Notes (Signed)
Patient's appointment was changed from office visit to telemedicine. Patient is complaining of nasal congestion, scratchy throat, nausea, headache since last Wednesday. Patient states he want a COVID test.

## 2019-03-13 NOTE — Progress Notes (Signed)
Telemedicine Encounter- SOAP NOTE Established Patient  This telephone encounter was conducted with the patient's (or proxy's) verbal consent via audio telecommunications: yes/no: Yes Patient was instructed to have this encounter in a suitably private space; and to only have persons present to whom they give permission to participate. In addition, patient identity was confirmed by use of name plus two identifiers (DOB and address).  I discussed the limitations, risks, security and privacy concerns of performing an evaluation and management service by telephone and the availability of in person appointments. I also discussed with the patient that there may be a patient responsible charge related to this service. The patient expressed understanding and agreed to proceed.  I spent a total of TIME; 0 MIN TO 60 MIN: 10 minutes talking with the patient or their proxy.  No chief complaint on file. Flulike symptoms  Subjective   Bruce Morales is a 43 y.o. male established patient. Telephone visit today complaining of flulike symptoms for the past 6 days.  Has some fever initially followed by nasal congestion, runny nose, general malaise and achiness, slight cough, some dyspnea on exertion, and just "not feeling right".  No other significant symptoms  HPI   Patient Active Problem List   Diagnosis Date Noted  . Other hyperlipidemia 07/18/2018  . Depression 07/18/2018  . Prediabetes 05/17/2018  . Vitamin D deficiency 05/17/2018  . Hearing loss 09/04/2015  . Bone marrow suppression   . Thrombocytopenia ([place])   . Pancytopenia ([place]) 06/24/2015  . Status post lumbar laminectomy 06/10/2015  . Class 2 severe obesity with serious comorbidity and body mass index (BMI) of 38.0 to 38.9 in adult ([place]) 11/13/2014  . Bilateral low back pain without sciatica 04/14/2014    Past Medical History:  Diagnosis Date  . Anginal pain ([place]) 04/15/2014  . Arthritis    "wrists, knees, lower back"  (04/16/2014)  . Carpal tunnel syndrome   . Chronic bronchitis ([place])    "probably get it q yr"  . Chronic lower back pain   . Chronic lower back pain   . Constipation   . Gall stones   . GERD (gastroesophageal reflux disease)   . GERD (gastroesophageal reflux disease)   . HLD (hyperlipidemia)   . Joint pain   . Meningitis   . Plantar fasciitis   . Right knee pain   . Sciatica     Current Outpatient Medications  Medication Sig Dispense Refill  . cyclobenzaprine (FLEXERIL) 10 MG tablet TAKE 1 TABLET (10 MG TOTAL) BY MOUTH AS NEEDED. 30 tablet 2  . diltiazem (CARDIZEM) 30 MG tablet TAKE 1 TABLET BY MOUTH AS NEEDED FOR RECTAL PAIN 20 tablet 0  . DUEXIS 800-26.6 MG TABS Take 1 tablet by mouth 3 (three) times daily. 90 tablet 0  . NUCYNTA 75 MG tablet TAKE 1 TABLET BY MOUTH EVERY 6 TO 8 HOURS AS NEEDED  0  . Vitamin D, Ergocalciferol, (DRISDOL) 1.25 MG (50000 UT) CAPS capsule TAKE 1 CAPSULE BY MOUTH ONE TIME PER WEEK 7 capsule 0   No current facility-administered medications for this visit.     Allergies  Allergen Reactions  . Hydrocodone Hives and Itching  . Oxycodone     Mild Itching, patient can tolerate oxycodone  . Percocet [Oxycodone-Acetaminophen]     Itching, patient says he tolerates    Social History   Socioeconomic History  . Marital status: Married    Spouse name: Delcie Roch   . Number of children: 2  . Years  of education: Not on file  . Highest education level: Not on file  Occupational History  . Occupation: Disabled  Social Needs  . Financial resource strain: Not on file  . Food insecurity    Worry: Not on file    Inability: Not on file  . Transportation needs    Medical: Not on file    Non-medical: Not on file  Tobacco Use  . Smoking status: Former Smoker    Packs/day: 0.50    Years: 27.00    Pack years: 13.50    Types: Cigarettes    Quit date: 03/11/2015    Years since quitting: 4.0  . Smokeless tobacco: Former Engineer, water and Sexual Activity   . Alcohol use: Yes    Alcohol/week: 0.0 standard drinks    Comment: 04/16/2014 "a 6 pack will last me a month"  . Drug use: No    Comment: 04/16/2014 "in my teens"  . Sexual activity: Yes  Lifestyle  . Physical activity    Days per week: Not on file    Minutes per session: Not on file  . Stress: Not on file  Relationships  . Social Musician on phone: Not on file    Gets together: Not on file    Attends religious service: Not on file    Active member of club or organization: Not on file    Attends meetings of clubs or organizations: Not on file    Relationship status: Not on file  . Intimate partner violence    Fear of current or ex partner: Not on file    Emotionally abused: Not on file    Physically abused: Not on file    Forced sexual activity: Not on file  Other Topics Concern  . Not on file  Social History Narrative   Drinks 2 cups of coffee in the morning.    Review of Systems  Constitutional: Positive for fever and malaise/fatigue.  HENT: Positive for congestion and sore throat.   Respiratory: Positive for cough and shortness of breath.   Cardiovascular: Negative for chest pain and palpitations.  Gastrointestinal: Negative for abdominal pain, diarrhea, nausea and vomiting.  Genitourinary: Negative for dysuria and hematuria.  Musculoskeletal: Positive for myalgias. Negative for back pain and neck pain.  Skin: Negative for rash.  Neurological: Negative for dizziness and headaches.  All other systems reviewed and are negative.   Objective  Alert and oriented x3 in no apparent respiratory distress Vitals as reported by the patient: Today's Vitals   03/13/19 0818  Weight: 250 lb (113.4 kg)  Height: 5\' 9"  (1.753 m)    Diagnoses and all orders for this visit:  Suspected COVID-19 virus infection -     Novel Coronavirus, NAA ([facility])  Flu-like symptoms  Clinically stable.  No red flag signs or symptoms.  Most likely COVID infection, at least until  proven otherwise. COVID precautions given.   I discussed the assessment and treatment plan with the patient. The patient was provided an opportunity to ask questions and all were answered. The patient agreed with the plan and demonstrated an understanding of the instructions.   The patient was advised to call back or seek an in-person evaluation if the symptoms worsen or if the condition fails to improve as anticipated.  I provided 10 minutes of non-face-to-face time during this encounter.  , MD  Primary Care at [facility]

## 2019-03-15 ENCOUNTER — Telehealth: Payer: Self-pay

## 2019-03-15 LAB — NOVEL CORONAVIRUS, NAA: SARS-CoV-2, NAA: DETECTED — AB

## 2019-03-15 NOTE — Telephone Encounter (Signed)
Spoke to pt and informed him of doctors recommendations, he verbalized understanding.

## 2019-03-15 NOTE — Telephone Encounter (Signed)
OTC medications for headaches and body pain such as Tylenol, nasal decongestants both topical and oral such as Sudafed, Afrin, Mucinex DM.  Target and treat the symptoms.  This has to run its course but if he gets worse with difficulty breathing he must go to the hospital.  Earlier he was given the following advice, I recommend he follows it. Patient advised to utilize over the counter medications to treat symptoms. Pt advised to seek treatment in the ED if respiratory issues/distress develops.Pt advised they should only leave home to seek and medical care and must wear a mask in public. Pt instructed to limit contact with family members or caregivers in the home. Pt advised to practice social distancing and to continue to use good preventative care measures such has frequent hand washing, staying out of crowds and cleaning hard surfaces frequently touched in the home.Pt informed that the health department will likely follow up and may have additional recommendations. Will notify [facility]  Department.

## 2019-03-15 NOTE — Telephone Encounter (Signed)
Spoke with pt about concerns and he would like to know if anything could be sent to his phramacy to treat his symptom.  He has been having headaches,nasal congestion/drainage, cough and body pain. He would like some recommendations.

## 2019-03-15 NOTE — Telephone Encounter (Signed)
Patient was notified of his positive covid result this morning and giving guidelines to follow. He would like to speak with Dr. Mitchel Morales about medication that are being used to treat virus. Advise patient that I would send a message.

## 2019-03-21 ENCOUNTER — Other Ambulatory Visit: Payer: Self-pay | Admitting: Emergency Medicine

## 2019-03-21 DIAGNOSIS — K594 Anal spasm: Secondary | ICD-10-CM

## 2019-04-04 ENCOUNTER — Other Ambulatory Visit: Payer: Self-pay

## 2019-04-04 ENCOUNTER — Encounter: Payer: Self-pay | Admitting: Emergency Medicine

## 2019-04-04 ENCOUNTER — Ambulatory Visit (INDEPENDENT_AMBULATORY_CARE_PROVIDER_SITE_OTHER): Payer: Medicare HMO | Admitting: Emergency Medicine

## 2019-04-04 VITALS — BP 115/74 | HR 61 | Temp 98.2°F | Resp 16 | Ht 68.0 in | Wt 252.0 lb

## 2019-04-04 DIAGNOSIS — Z87891 Personal history of nicotine dependence: Secondary | ICD-10-CM | POA: Diagnosis not present

## 2019-04-04 DIAGNOSIS — Z13220 Encounter for screening for lipoid disorders: Secondary | ICD-10-CM | POA: Diagnosis not present

## 2019-04-04 DIAGNOSIS — Z0000 Encounter for general adult medical examination without abnormal findings: Secondary | ICD-10-CM

## 2019-04-04 DIAGNOSIS — Z122 Encounter for screening for malignant neoplasm of respiratory organs: Secondary | ICD-10-CM | POA: Diagnosis not present

## 2019-04-04 DIAGNOSIS — U071 COVID-19: Secondary | ICD-10-CM

## 2019-04-04 DIAGNOSIS — Z13228 Encounter for screening for other metabolic disorders: Secondary | ICD-10-CM

## 2019-04-04 DIAGNOSIS — Z130 Encounter for screening for diseases of the blood and blood-forming organs and certain disorders involving the immune mechanism: Secondary | ICD-10-CM | POA: Diagnosis not present

## 2019-04-04 DIAGNOSIS — E559 Vitamin D deficiency, unspecified: Secondary | ICD-10-CM

## 2019-04-04 DIAGNOSIS — E7800 Pure hypercholesterolemia, unspecified: Secondary | ICD-10-CM | POA: Diagnosis not present

## 2019-04-04 DIAGNOSIS — Z1329 Encounter for screening for other suspected endocrine disorder: Secondary | ICD-10-CM

## 2019-04-04 DIAGNOSIS — R7303 Prediabetes: Secondary | ICD-10-CM | POA: Diagnosis not present

## 2019-04-04 DIAGNOSIS — G894 Chronic pain syndrome: Secondary | ICD-10-CM

## 2019-04-04 MED ORDER — CYCLOBENZAPRINE HCL 10 MG PO TABS: 10.0000 mg | ORAL_TABLET | ORAL | 2 refills | Status: DC | PRN

## 2019-04-04 MED ORDER — VITAMIN D (ERGOCALCIFEROL) 1.25 MG (50000 UNIT) PO CAPS: ORAL_CAPSULE | ORAL | 0 refills | Status: DC

## 2019-04-04 NOTE — Patient Instructions (Addendum)
   If you have lab work done today you will be contacted with your lab results within the next 2 weeks.  If you have not heard from us then please contact us. The fastest way to get your results is to register for My Chart.   IF you received an x-ray today, you will receive an invoice from [facility]. Please contact [facility] at [phone] with questions or concerns regarding your invoice.   IF you received labwork today, you will receive an invoice from [facility]. Please contact [facility] at [phone] with questions or concerns regarding your invoice.   Our billing staff will not be able to assist you with questions regarding bills from these companies.  You will be contacted with the lab results as soon as they are available. The fastest way to get your results is to activate your My Chart account. Instructions are located on the last page of this paperwork. If you have not heard from us regarding the results in 2 weeks, please contact this office.     Health Maintenance, Male Adopting a healthy lifestyle and getting preventive care are important in promoting health and wellness. Ask your health care provider about:  The right schedule for you to have regular tests and exams.  Things you can do on your own to prevent diseases and keep yourself healthy. What should I know about diet, weight, and exercise? Eat a healthy diet   Eat a diet that includes plenty of vegetables, fruits, low-fat dairy products, and lean protein.  Do not eat a lot of foods that are high in solid fats, added sugars, or sodium. Maintain a healthy weight Body mass index (BMI) is a measurement that can be used to identify possible weight problems. It estimates body fat based on height and weight. Your health care provider can help determine your BMI and help you achieve or maintain a healthy weight. Get regular exercise Get regular exercise. This is one of the most important things you  can do for your health. Most adults should:  Exercise for at least 150 minutes each week. The exercise should increase your heart rate and make you sweat (moderate-intensity exercise).  Do strengthening exercises at least twice a week. This is in addition to the moderate-intensity exercise.  Spend less time sitting. Even light physical activity can be beneficial. Watch cholesterol and blood lipids Have your blood tested for lipids and cholesterol at 41 years of age, then have this test every 5 years. You may need to have your cholesterol levels checked more often if:  Your lipid or cholesterol levels are high.  You are older than 40 years of age.  You are at high risk for heart disease. What should I know about cancer screening? Many types of cancers can be detected early and may often be prevented. Depending on your health history and family history, you may need to have cancer screening at various ages. This may include screening for:  Colorectal cancer.  Prostate cancer.  Skin cancer.  Lung cancer. What should I know about heart disease, diabetes, and high blood pressure? Blood pressure and heart disease  High blood pressure causes heart disease and increases the risk of stroke. This is more likely to develop in people who have high blood pressure readings, are of African descent, or are overweight.  Talk with your health care provider about your target blood pressure readings.  Have your blood pressure checked: ? Every 3-5 years if you are 18-39 years   of age. ? Every year if you are 40 years old or older.  If you are between the ages of 65 and 75 and are a current or former smoker, ask your health care provider if you should have a one-time screening for abdominal aortic aneurysm (AAA). Diabetes Have regular diabetes screenings. This checks your fasting blood sugar level. Have the screening done:  Once every three years after age 45 if you are at a normal weight and have  a low risk for diabetes.  More often and at a younger age if you are overweight or have a high risk for diabetes. What should I know about preventing infection? Hepatitis B If you have a higher risk for hepatitis B, you should be screened for this virus. Talk with your health care provider to find out if you are at risk for hepatitis B infection. Hepatitis C Blood testing is recommended for:  Everyone born from 1945 through 1965.  Anyone with known risk factors for hepatitis C. Sexually transmitted infections (STIs)  You should be screened each year for STIs, including gonorrhea and chlamydia, if: ? You are sexually active and are younger than 41 years of age. ? You are older than 41 years of age and your health care provider tells you that you are at risk for this type of infection. ? Your sexual activity has changed since you were last screened, and you are at increased risk for chlamydia or gonorrhea. Ask your health care provider if you are at risk.  Ask your health care provider about whether you are at high risk for HIV. Your health care provider may recommend a prescription medicine to help prevent HIV infection. If you choose to take medicine to prevent HIV, you should first get tested for HIV. You should then be tested every 3 months for as long as you are taking the medicine. Follow these instructions at home: Lifestyle  Do not use any products that contain nicotine or tobacco, such as cigarettes, e-cigarettes, and chewing tobacco. If you need help quitting, ask your health care provider.  Do not use street drugs.  Do not share needles.  Ask your health care provider for help if you need support or information about quitting drugs. Alcohol use  Do not drink alcohol if your health care provider tells you not to drink.  If you drink alcohol: ? Limit how much you have to 0-2 drinks a day. ? Be aware of how much alcohol is in your drink. In the [place] one drink equals one 12  oz bottle of beer (355 mL), one 5 oz glass of wine (148 mL), or one 1 oz glass of hard liquor (44 mL). General instructions  Schedule regular health, dental, and eye exams.  Stay current with your vaccines.  Tell your health care provider if: ? You often feel depressed. ? You have ever been abused or do not feel safe at home. Summary  Adopting a healthy lifestyle and getting preventive care are important in promoting health and wellness.  Follow your health care provider's instructions about healthy diet, exercising, and getting tested or screened for diseases.  Follow your health care provider's instructions on monitoring your cholesterol and blood pressure. This information is not intended to replace advice given to you by your health care provider. Make sure you discuss any questions you have with your health care provider. Document Released: 11/13/2007 Document Revised: 05/10/2018 Document Reviewed: 05/10/2018 Elsevier Patient Education  2020 [employer]  

## 2019-04-04 NOTE — Progress Notes (Signed)
[name] 43 y.o.   Chief Complaint  Patient presents with  . Annual Exam    HISTORY OF PRESENT ILLNESS: This is a 43 y.o. male here for annual exam. Ex smoker.  Stopped 4 years ago but smoked over 30 years.  Started at age 73. History of chronic back pain.  Sees orthopedist regularly.  Status post lumbar laminectomy. Has a history of prediabetes, dyslipidemia, pancytopenia, depression, and vitamin D deficiency. Is obese and does not exercise enough. Wt Readings from Last 3 Encounters:  04/04/19 252 lb (114.3 kg)  03/13/19 250 lb (113.4 kg)  08/09/18 254 lb 6.4 oz (115.4 kg)   BP Readings from Last 3 Encounters:  04/04/19 115/74  08/09/18 119/78  07/18/18 121/84   Lab Results  Component Value Date   CHOL 209 (H) 07/18/2018   HDL 28 (L) 07/18/2018   LDLCALC 127 (H) 07/18/2018   TRIG 272 (H) 07/18/2018   CHOLHDL 6.4 (H) 12/08/2017   Recent Covid infection the lasted about 2 weeks.  Started October 6, positive test on October 13.  Presently asymptomatic.  HPI   Prior to Admission medications   Medication Sig Start Date End Date Taking? Authorizing Provider  cyclobenzaprine (FLEXERIL) 10 MG tablet TAKE 1 TABLET (10 MG TOTAL) BY MOUTH AS NEEDED. 01/31/19  Yes Meiah Zamudio, [name], MD  diltiazem (CARDIZEM) 30 MG tablet TAKE 1 TABLET BY MOUTH AS NEEDED FOR RECTAL PAIN 03/21/19  Yes Field Staniszewski, [name], MD  DUEXIS 800-26.6 MG TABS Take 1 tablet by mouth 3 (three) times daily. 08/09/18  Yes Wyn Nettle, [name], MD  HYDROmorphone (DILAUDID) 2 MG tablet Take by mouth every 4 (four) hours as needed for severe pain.   Yes [provider]  NUCYNTA 75 MG tablet TAKE 1 TABLET BY MOUTH EVERY 6 TO 8 HOURS AS NEEDED 02/22/18   [provider]  Vitamin D, Ergocalciferol, (DRISDOL) 1.25 MG (50000 UT) CAPS capsule TAKE 1 CAPSULE BY MOUTH ONE TIME PER WEEK Patient not taking: Reported on 04/04/2019 01/02/19   [name], MD    Allergies  Allergen  Reactions  . Hydrocodone Hives and Itching  . Oxycodone     Mild Itching, patient can tolerate oxycodone  . Percocet [Oxycodone-Acetaminophen]     Itching, patient says he tolerates    Patient Active Problem List   Diagnosis Date Noted  . Other hyperlipidemia 07/18/2018  . Depression 07/18/2018  . Prediabetes 05/17/2018  . Vitamin D deficiency 05/17/2018  . Hearing loss 09/04/2015  . Bone marrow suppression   . Thrombocytopenia (HCC)   . Pancytopenia (HCC) 06/24/2015  . Status post lumbar laminectomy 06/10/2015  . Class 2 severe obesity with serious comorbidity and body mass index (BMI) of 38.0 to 38.9 in adult [facility]) 11/13/2014    Past Medical History:  Diagnosis Date  . Anginal pain (HCC) 04/15/2014  . Arthritis    "wrists, knees, lower back" (04/16/2014)  . Carpal tunnel syndrome   . Chronic bronchitis (HCC)    "probably get it q yr"  . Chronic lower back pain   . Chronic lower back pain   . Constipation   . Gall stones   . GERD (gastroesophageal reflux disease)   . GERD (gastroesophageal reflux disease)   . HLD (hyperlipidemia)   . Joint pain   . Meningitis   . Plantar fasciitis   . Right knee pain   . Sciatica     Past Surgical History:  Procedure Laterality Date  . CARPAL TUNNEL RELEASE  02/2018 Right arm  . CHOLECYSTECTOMY  2011  . ESOPHAGOGASTRODUODENOSCOPY N/A 02/27/2013   Procedure: ESOPHAGOGASTRODUODENOSCOPY (EGD);  Surgeon: Cleotis Nipper, MD;  Location: [facility];  Service: Endoscopy;  Laterality: N/A;  . KNEE ARTHROSCOPY Right 2013 X 2  . KNEE ARTHROSCOPY Right 02/22/2018  . LAMINECTOMY AND MICRODISCECTOMY SPINE    . LEFT HEART CATHETERIZATION WITH CORONARY ANGIOGRAM N/A 04/17/2014   Procedure: LEFT HEART CATHETERIZATION WITH CORONARY ANGIOGRAM;  Surgeon: Birdie Riddle, MD;  Location: [facility];  Service: Cardiovascular;  Laterality: N/A;  . RADIOLOGY WITH ANESTHESIA N/A 06/11/2015   Procedure: MRI LUMBAR WITH AND WITHOUT CONTRAST;   Surgeon: Medication Radiologist, MD;  Location: [facility];  Service: Radiology;  Laterality: N/A;    Social History   Socioeconomic History  . Marital status: Married    Spouse name: Delcie Roch   . Number of children: 2  . Years of education: Not on file  . Highest education level: Not on file  Occupational History  . Occupation: Disabled  Social Needs  . Financial resource strain: Not on file  . Food insecurity    Worry: Not on file    Inability: Not on file  . Transportation needs    Medical: Not on file    Non-medical: Not on file  Tobacco Use  . Smoking status: Former Smoker    Packs/day: 0.50    Years: 27.00    Pack years: 13.50    Types: Cigarettes    Quit date: 03/11/2015    Years since quitting: 4.0  . Smokeless tobacco: Former Network engineer and Sexual Activity  . Alcohol use: Yes    Alcohol/week: 0.0 standard drinks    Comment: 04/16/2014 "a 6 pack will last me a month"  . Drug use: No    Comment: 04/16/2014 "in my teens"  . Sexual activity: Yes  Lifestyle  . Physical activity    Days per week: Not on file    Minutes per session: Not on file  . Stress: Not on file  Relationships  . Social Herbalist on phone: Not on file    Gets together: Not on file    Attends religious service: Not on file    Active member of club or organization: Not on file    Attends meetings of clubs or organizations: Not on file    Relationship status: Not on file  . Intimate partner violence    Fear of current or ex partner: Not on file    Emotionally abused: Not on file    Physically abused: Not on file    Forced sexual activity: Not on file  Other Topics Concern  . Not on file  Social History Narrative   Drinks 2 cups of coffee in the morning.    Family History  Problem Relation Age of Onset  . Multiple sclerosis Mother   . Obesity Mother   . Heart failure Maternal Grandfather   . Diabetes Father   . Hyperlipidemia Father   . Sleep apnea Father   . Obesity  Father   . Fibromyalgia Sister   . Schizophrenia Brother   . Stroke Maternal Grandmother   . Diabetes Paternal Grandmother      Review of Systems  Constitutional: Negative.  Negative for chills and fever.  HENT: Negative.  Negative for congestion and sore throat.   Eyes: Negative.   Respiratory: Negative.  Negative for cough and shortness of breath.   Cardiovascular: Negative.  Negative for chest  pain and palpitations.  Gastrointestinal: Negative.  Negative for abdominal pain, blood in stool, diarrhea, nausea and vomiting.  Genitourinary: Negative.  Negative for dysuria and hematuria.  Musculoskeletal: Positive for back pain and joint pain. Negative for myalgias.  Skin: Negative.  Negative for rash.  Neurological: Negative.  Negative for dizziness and headaches.  Endo/Heme/Allergies: Negative.   All other systems reviewed and are negative.   Vitals:   04/04/19 0806  BP: 115/74  Pulse: 61  Resp: 16  Temp: 98.2 F (36.8 C)  SpO2: 95%    Physical Exam Vitals signs reviewed.  Constitutional:      Appearance: Normal appearance.  HENT:     Head: Normocephalic.  Eyes:     Extraocular Movements: Extraocular movements intact.     Conjunctiva/sclera: Conjunctivae normal.     Pupils: Pupils are equal, round, and reactive to light.  Neck:     Musculoskeletal: Normal range of motion and neck supple.     Vascular: No carotid bruit.  Cardiovascular:     Rate and Rhythm: Normal rate and regular rhythm.     Pulses: Normal pulses.     Heart sounds: Normal heart sounds.  Pulmonary:     Effort: Pulmonary effort is normal.     Breath sounds: Normal breath sounds.  Abdominal:     General: There is no distension.     Palpations: Abdomen is soft. There is no mass.     Tenderness: There is no abdominal tenderness.  Musculoskeletal: Normal range of motion.  Lymphadenopathy:     Cervical: No cervical adenopathy.  Skin:    General: Skin is warm and dry.     Capillary Refill:  Capillary refill takes less than 2 seconds.  Neurological:     General: No focal deficit present.     Mental Status: He is alert and oriented to person, place, and time.  Psychiatric:        Mood and Affect: Mood normal.        Behavior: Behavior normal.      ASSESSMENT & PLAN: Jaxzen was seen today for annual exam.  Diagnoses and all orders for this visit:  Routine general medical examination at a health care facility  Chronic pain syndrome -     cyclobenzaprine (FLEXERIL) 10 MG tablet; Take 1 tablet (10 mg total) by mouth as needed.  Vitamin D deficiency -     Vitamin D, Ergocalciferol, (DRISDOL) 1.25 MG (50000 UT) CAPS capsule; TAKE 1 CAPSULE BY MOUTH ONE TIME PER WEEK  Screening for deficiency anemia -     CBC with Differential/Platelet  Screening for lipoid disorders -     Lipid panel  Screening for endocrine, metabolic and immunity disorder -     Hemoglobin A1c -     VITAMIN D 25 Hydroxy (Vit-D Deficiency, Fractures) -     Comprehensive metabolic panel  COVID-19 virus infection Comments: Recent infection.  Much improved.  Asymptomatic Orders: -     SAR CoV2 Serology (COVID 19)AB(IGG)IA  Encounter for screening for malignant neoplasm of respiratory organs -     CT CHEST LUNG CA SCREEN LOW DOSE W/O CM; Future  Ex-smoker    Patient Instructions       If you have lab work done today you will be contacted with your lab results within the next 2 weeks.  If you have not heard from [place] then please contact us. The fastest way to get your results is to register for My Chart.   IF  you received an x-ray today, you will receive an invoice from [facility]. Please contact [facility] at [phone] with questions or concerns regarding your invoice.   IF you received labwork today, you will receive an invoice from [facility]. Please contact [facility] at [phone] with questions or concerns regarding your invoice.   Our billing staff will not be  able to assist you with questions regarding bills from these companies.  You will be contacted with the lab results as soon as they are available. The fastest way to get your results is to activate your My Chart account. Instructions are located on the last page of this paperwork. If you have not heard from [place] regarding the results in 2 weeks, please contact this office.      Health Maintenance, Male Adopting a healthy lifestyle and getting preventive care are important in promoting health and wellness. Ask your health care provider about:  The right schedule for you to have regular tests and exams.  Things you can do on your own to prevent diseases and keep yourself healthy. What should I know about diet, weight, and exercise? Eat a healthy diet   Eat a diet that includes plenty of vegetables, fruits, low-fat dairy products, and lean protein.  Do not eat a lot of foods that are high in solid fats, added sugars, or sodium. Maintain a healthy weight Body mass index (BMI) is a measurement that can be used to identify possible weight problems. It estimates body fat based on height and weight. Your health care provider can help determine your BMI and help you achieve or maintain a healthy weight. Get regular exercise Get regular exercise. This is one of the most important things you can do for your health. Most adults should:  Exercise for at least 150 minutes each week. The exercise should increase your heart rate and make you sweat (moderate-intensity exercise).  Do strengthening exercises at least twice a week. This is in addition to the moderate-intensity exercise.  Spend less time sitting. Even light physical activity can be beneficial. Watch cholesterol and blood lipids Have your blood tested for lipids and cholesterol at 41 years of age, then have this test every 5 years. You may need to have your cholesterol levels checked more often if:  Your lipid or cholesterol levels are  high.  You are older than 41 years of age.  You are at high risk for heart disease. What should I know about cancer screening? Many types of cancers can be detected early and may often be prevented. Depending on your health history and family history, you may need to have cancer screening at various ages. This may include screening for:  Colorectal cancer.  Prostate cancer.  Skin cancer.  Lung cancer. What should I know about heart disease, diabetes, and high blood pressure? Blood pressure and heart disease  High blood pressure causes heart disease and increases the risk of stroke. This is more likely to develop in people who have high blood pressure readings, are of African descent, or are overweight.  Talk with your health care provider about your target blood pressure readings.  Have your blood pressure checked: ? Every 3-5 years if you are 64-33 years of age. ? Every year if you are 45 years old or older.  If you are between the ages of 4 and 2 and are a current or former smoker, ask your health care provider if you should have a one-time screening for abdominal aortic aneurysm (AAA). Diabetes  Have regular diabetes screenings. This checks your fasting blood sugar level. Have the screening done:  Once every three years after age 145 if you are at a normal weight and have a low risk for diabetes.  More often and at a younger age if you are overweight or have a high risk for diabetes. What should I know about preventing infection? Hepatitis B If you have a higher risk for hepatitis B, you should be screened for this virus. Talk with your health care provider to find out if you are at risk for hepatitis B infection. Hepatitis C Blood testing is recommended for:  Everyone born from [id number] through 1963.  Anyone with known risk factors for hepatitis C. Sexually transmitted infections (STIs)  You should be screened each year for STIs, including gonorrhea and chlamydia, if: ?  You are sexually active and are younger than 41 years of age. ? You are older than 41 years of age and your health care provider tells you that you are at risk for this type of infection. ? Your sexual activity has changed since you were last screened, and you are at increased risk for chlamydia or gonorrhea. Ask your health care provider if you are at risk.  Ask your health care provider about whether you are at high risk for HIV. Your health care provider may recommend a prescription medicine to help prevent HIV infection. If you choose to take medicine to prevent HIV, you should first get tested for HIV. You should then be tested every 3 months for as long as you are taking the medicine. Follow these instructions at home: Lifestyle  Do not use any products that contain nicotine or tobacco, such as cigarettes, e-cigarettes, and chewing tobacco. If you need help quitting, ask your health care provider.  Do not use street drugs.  Do not share needles.  Ask your health care provider for help if you need support or information about quitting drugs. Alcohol use  Do not drink alcohol if your health care provider tells you not to drink.  If you drink alcohol: ? Limit how much you have to 0-2 drinks a day. ? Be aware of how much alcohol is in your drink. In the [place] one drink equals one 12 oz bottle of beer (355 mL), one 5 oz glass of wine (148 mL), or one 1 oz glass of hard liquor (44 mL). General instructions  Schedule regular health, dental, and eye exams.  Stay current with your vaccines.  Tell your health care provider if: ? You often feel depressed. ? You have ever been abused or do not feel safe at home. Summary  Adopting a healthy lifestyle and getting preventive care are important in promoting health and wellness.  Follow your health care provider's instructions about healthy diet, exercising, and getting tested or screened for diseases.  Follow your health care provider's  instructions on monitoring your cholesterol and blood pressure. This information is not intended to replace advice given to you by your health care provider. Make sure you discuss any questions you have with your health care provider. Document Released: 11/13/2007 Document Revised: 05/10/2018 Document Reviewed: 05/10/2018 Elsevier Patient Education  2020 Elsevier Inc.      Edwina BarthMiguel Jeriah Skufca, MD [facility]

## 2019-04-05 ENCOUNTER — Encounter: Payer: Self-pay | Admitting: Emergency Medicine

## 2019-04-05 LAB — LIPID PANEL
Chol/HDL Ratio: 6.6 ratio — ABNORMAL HIGH (ref 0.0–5.0)
Cholesterol, Total: 205 mg/dL — ABNORMAL HIGH (ref 100–199)
HDL: 31 mg/dL — ABNORMAL LOW (ref >39)
LDL Chol Calc (NIH): 147 mg/dL — ABNORMAL HIGH (ref 0–99)
Triglycerides: 149 mg/dL (ref 0–149)
VLDL Cholesterol Cal: 27 mg/dL (ref 5–40)

## 2019-04-05 LAB — CBC WITH DIFFERENTIAL/PLATELET
Basophils Absolute: 0 10*3/uL (ref 0.0–0.2)
Basos: 1 %
EOS (ABSOLUTE): 0.1 10*3/uL (ref 0.0–0.4)
Eos: 2 %
Hematocrit: 42.3 % (ref 37.5–51.0)
Hemoglobin: 14.4 g/dL (ref 13.0–17.7)
Immature Grans (Abs): 0 10*3/uL (ref 0.0–0.1)
Immature Granulocytes: 0 %
Lymphocytes Absolute: 1.8 10*3/uL (ref 0.7–3.1)
Lymphs: 37 %
MCH: 29.6 pg (ref 26.6–33.0)
MCHC: 34 g/dL (ref 31.5–35.7)
MCV: 87 fL (ref 79–97)
Monocytes Absolute: 0.5 10*3/uL (ref 0.1–0.9)
Monocytes: 10 %
Neutrophils Absolute: 2.5 10*3/uL (ref 1.4–7.0)
Neutrophils: 50 %
Platelets: 125 10*3/uL — ABNORMAL LOW (ref 150–450)
RBC: 4.87 x10E6/uL (ref 4.14–5.80)
RDW: 12.6 % (ref 11.6–15.4)
WBC: 4.9 10*3/uL (ref 3.4–10.8)

## 2019-04-05 LAB — SAR COV2 SEROLOGY (COVID19)AB(IGG),IA

## 2019-04-05 LAB — COMPREHENSIVE METABOLIC PANEL
ALT: 73 IU/L — ABNORMAL HIGH (ref 0–44)
AST: 28 IU/L (ref 0–40)
Albumin/Globulin Ratio: 2.2 (ref 1.2–2.2)
Albumin: 4.6 g/dL (ref 4.0–5.0)
Alkaline Phosphatase: 71 IU/L (ref 39–117)
BUN/Creatinine Ratio: 14 (ref 9–20)
BUN: 14 mg/dL (ref 6–24)
Bilirubin Total: 0.7 mg/dL (ref 0.0–1.2)
CO2: 22 mmol/L (ref 20–29)
Calcium: 9 mg/dL (ref 8.7–10.2)
Chloride: 105 mmol/L (ref 96–106)
Creatinine, Ser: 0.97 mg/dL (ref 0.76–1.27)
GFR calc Af Amer: 112 mL/min/{1.73_m2} (ref >59)
GFR calc non Af Amer: 97 mL/min/{1.73_m2} (ref >59)
Globulin, Total: 2.1 g/dL (ref 1.5–4.5)
Glucose: 128 mg/dL — ABNORMAL HIGH (ref 65–99)
Potassium: 5 mmol/L (ref 3.5–5.2)
Sodium: 142 mmol/L (ref 134–144)
Total Protein: 6.7 g/dL (ref 6.0–8.5)

## 2019-04-05 LAB — EUROIMMUN SARS-COV-2 AB, IGG: Euroimmun SARS-CoV-2 Ab, IgG: POSITIVE — AB

## 2019-04-05 LAB — HEMOGLOBIN A1C
Est. average glucose Bld gHb Est-mCnc: 117 mg/dL
Hgb A1c MFr Bld: 5.7 % — ABNORMAL HIGH (ref 4.8–5.6)

## 2019-04-05 LAB — VITAMIN D 25 HYDROXY (VIT D DEFICIENCY, FRACTURES): Vit D, 25-Hydroxy: 27.2 ng/mL — ABNORMAL LOW (ref 30.0–100.0)

## 2019-04-08 ENCOUNTER — Other Ambulatory Visit: Payer: Self-pay | Admitting: Emergency Medicine

## 2019-04-08 DIAGNOSIS — K594 Anal spasm: Secondary | ICD-10-CM

## 2019-04-09 ENCOUNTER — Telehealth: Payer: Self-pay | Admitting: Emergency Medicine

## 2019-04-09 NOTE — Telephone Encounter (Signed)
Faxed referral info to [phone] 04/09/2019 from [facility]   FR

## 2019-04-27 ENCOUNTER — Other Ambulatory Visit: Payer: Self-pay | Admitting: Emergency Medicine

## 2019-04-27 DIAGNOSIS — K594 Anal spasm: Secondary | ICD-10-CM

## 2019-04-27 NOTE — Telephone Encounter (Signed)
Refilled medication

## 2019-04-27 NOTE — Telephone Encounter (Signed)
Requested medication (s) are due for refill today: yes  Requested medication (s) are on the active medication list: yes  Last refill:  04/09/2019  Future visit scheduled: yes  Notes to clinic: review for refill Looks like medication is for short term use   Requested Prescriptions  Pending Prescriptions Disp Refills   diltiazem (CARDIZEM) 30 MG tablet [Pharmacy Med Name: DILTIAZEM 30 MG TABLET] 20 tablet 0    Sig: TAKE 1 TABLET BY MOUTH AS NEEDED FOR RECTAL PAIN     Cardiovascular:  Calcium Channel Blockers Passed - 04/27/2019  9:22 AM      Passed - Last BP in normal range    BP Readings from Last 1 Encounters:  04/04/19 115/74         Passed - Valid encounter within last 6 months    Recent Outpatient Visits          3 weeks ago Routine general medical examination at a health care facility   Primary Care at [place], [place], MD   1 month ago Suspected COVID-19 virus infection   Primary Care at [facility], Ines Bloomer, MD   8 months ago Proctalgia fugax   Primary Care at [place], [place], MD   10 months ago Influenza   Primary Care at [place], [place], MD   1 year ago Class 2 obesity without serious comorbidity with body mass index (BMI) of 37.0 to 37.9 in adult, unspecified obesity type   Primary Care at [facility], Reather Laurence, PA-C      Future Appointments            In 43 months Sagardia, Ines Bloomer, MD Primary Care at [place], [facility]

## 2019-05-26 ENCOUNTER — Other Ambulatory Visit: Payer: Self-pay | Admitting: Emergency Medicine

## 2019-05-26 DIAGNOSIS — K594 Anal spasm: Secondary | ICD-10-CM

## 2019-05-26 NOTE — Telephone Encounter (Signed)
Forwarding medication refill request to the clinical pool for review. 

## 2019-06-15 ENCOUNTER — Telehealth: Payer: Self-pay | Admitting: Emergency Medicine

## 2019-06-15 NOTE — Telephone Encounter (Signed)
[employer] sent letter in regard to Cyclobenzapr tab 10 mg. Insurance no longer covers prescription. Medicare has required plan to send patient a 30 day supply.

## 2019-06-18 ENCOUNTER — Other Ambulatory Visit: Payer: Self-pay | Admitting: Emergency Medicine

## 2019-06-18 MED ORDER — TIZANIDINE HCL 4 MG PO TABS: 4.0000 mg | ORAL_TABLET | Freq: Three times a day (TID) | ORAL | 1 refills | Status: DC | PRN

## 2019-06-20 ENCOUNTER — Other Ambulatory Visit: Payer: Self-pay | Admitting: Emergency Medicine

## 2019-06-20 DIAGNOSIS — K594 Anal spasm: Secondary | ICD-10-CM

## 2019-06-21 NOTE — Telephone Encounter (Signed)
Pa request processed. Key [id number]  2nd pa request processed. Key [id number]

## 2019-06-22 NOTE — Telephone Encounter (Signed)
Pt checking on status of this message. Please advise 

## 2019-06-22 NOTE — Telephone Encounter (Signed)
Both pa requests have been denied. Denial letter in providers box at nurses station

## 2019-06-25 NOTE — Telephone Encounter (Signed)
I have called pt back to gather some more information. Bruce Morales stated that he is now aware of the denial from both Duexis and Cyclobenzaprine.   He is on the tiZanzine and it does help some, however it is as effective as the Cyclobenzaprine. Pt is almost out of the Duexis and he is now taking iburprofen with not as much relief.    Pt stated that he has tried 3 medications naproxen, ibuprofen, and Lodine with no relief.   For the Duexis it is only covered for Rheumatoid arthritis and osteoporosis.  (we can see if they will accept the PA if we add the Bone marrow suppression with it.)  Please assist with the final PA. IF Denied again we will send the message to provider to see if an alternative medication can be sent in.

## 2019-06-28 NOTE — Telephone Encounter (Signed)
CoverMyMeds will not allow me to process another PA due to [employer] already denying the prescriptions. My requests were canceled

## 2019-06-29 NOTE — Telephone Encounter (Signed)
I have called pt back and informed him of the unfavorable outcome of the PA denials for both medications.   Pt stated that he is on the tiZanzine and the ibuprofen 800 mg for the time being, however they are not working as well.  Pt would like to know if there were any other alternative mediations we can try that insurance may be willing to pay for.

## 2019-06-29 NOTE — Telephone Encounter (Signed)
No other alternatives. Thanks.

## 2019-08-12 ENCOUNTER — Other Ambulatory Visit: Payer: Self-pay | Admitting: Emergency Medicine

## 2019-08-12 DIAGNOSIS — G894 Chronic pain syndrome: Secondary | ICD-10-CM

## 2019-08-12 NOTE — Telephone Encounter (Signed)
Requested medication (s) are due for refill today: yes  Requested medication (s) are on the active medication list: no  Last refill:  04/04/19  Future visit scheduled: yes  Notes to clinic:  med expired 06/18/19 Medication not delegated to NT to refill   Requested Prescriptions  Pending Prescriptions Disp Refills   cyclobenzaprine (FLEXERIL) 10 MG tablet [Pharmacy Med Name: CYCLOBENZAPRINE 10 MG TABLET] 30 tablet 2    Sig: TAKE 1 TABLET BY MOUTH AS NEEDED      Not Delegated - Analgesics:  Muscle Relaxants Failed - 08/12/2019 10:05 AM      Failed - This refill cannot be delegated      Passed - Valid encounter within last 6 months    Recent Outpatient Visits           4 months ago Routine general medical examination at a health care facility   Primary Care at [place], [place], MD   5 months ago Suspected COVID-19 virus infection   Primary Care at [facility], [name], MD   1 year ago Proctalgia fugax   Primary Care at [place], [place], MD   1 year ago Influenza   Primary Care at [place], [place], MD   1 year ago Class 2 obesity without serious comorbidity with body mass index (BMI) of 37.0 to 37.9 in adult, unspecified obesity type   Primary Care at [facility], Gerald Stabs, PA-C       Future Appointments             In 7 months Sagardia, [name], MD Primary Care at [place], [facility]

## 2019-08-14 NOTE — Telephone Encounter (Signed)
Patient is requesting a refill of the following medications: Requested Prescriptions   Pending Prescriptions Disp Refills  . cyclobenzaprine (FLEXERIL) 10 MG tablet [Pharmacy Med Name: CYCLOBENZAPRINE 10 MG TABLET] 30 tablet 2    Sig: TAKE 1 TABLET BY MOUTH AS NEEDED    Date of patient request: 08/12/2019 Last office visit: 04/04/2019 Date of last refill: 04/04/2019 Last refill amount: 30 tablets  Follow up time period per chart: appointment schedules

## 2019-08-21 ENCOUNTER — Other Ambulatory Visit: Payer: Self-pay | Admitting: Emergency Medicine

## 2019-08-21 DIAGNOSIS — K594 Anal spasm: Secondary | ICD-10-CM

## 2019-08-21 NOTE — Telephone Encounter (Signed)
Requested Prescriptions  Pending Prescriptions Disp Refills  . diltiazem (CARDIZEM) 30 MG tablet [Pharmacy Med Name: DILTIAZEM 30 MG TABLET] 20 tablet 2    Sig: TAKE 1 TABLET BY MOUTH AS NEEDED FOR RECTAL PAIN     Cardiovascular:  Calcium Channel Blockers Passed - 08/21/2019  9:25 AM      Passed - Last BP in normal range    BP Readings from Last 1 Encounters:  04/04/19 115/74         Passed - Valid encounter within last 6 months    Recent Outpatient Visits          4 months ago Routine general medical examination at a health care facility   Primary Care at [place], [place], MD   5 months ago Suspected COVID-19 virus infection   Primary Care at [facility], [name], MD   1 year ago Proctalgia fugax   Primary Care at [place], [name], MD   1 year ago Influenza   Primary Care at [place], [place], MD   1 year ago Class 2 obesity without serious comorbidity with body mass index (BMI) of 37.0 to 37.9 in adult, unspecified obesity type   Primary Care at [facility], Gerald Stabs, PA-C      Future Appointments            In 7 months Sagardia, [name], MD Primary Care at [place], [facility]

## 2019-09-27 ENCOUNTER — Other Ambulatory Visit: Payer: Self-pay | Admitting: Orthopedic Surgery

## 2019-09-27 DIAGNOSIS — M542 Cervicalgia: Secondary | ICD-10-CM

## 2019-09-27 DIAGNOSIS — M545 Low back pain, unspecified: Secondary | ICD-10-CM

## 2019-10-26 ENCOUNTER — Other Ambulatory Visit: Payer: Medicare HMO

## 2019-10-31 ENCOUNTER — Ambulatory Visit
Admission: RE | Admit: 2019-10-31 | Discharge: 2019-10-31 | Disposition: A | Discharge status: Home / Self Care | Payer: Medicare HMO | Source: Ambulatory Visit | Attending: Orthopedic Surgery | Admitting: Orthopedic Surgery

## 2019-10-31 ENCOUNTER — Other Ambulatory Visit: Payer: Self-pay

## 2019-10-31 DIAGNOSIS — M542 Cervicalgia: Secondary | ICD-10-CM

## 2019-10-31 DIAGNOSIS — M545 Low back pain, unspecified: Secondary | ICD-10-CM

## 2020-02-06 ENCOUNTER — Ambulatory Visit (INDEPENDENT_AMBULATORY_CARE_PROVIDER_SITE_OTHER): Payer: Medicare Other

## 2020-02-06 ENCOUNTER — Ambulatory Visit: Payer: Medicare Other | Admitting: Emergency Medicine

## 2020-02-06 ENCOUNTER — Encounter: Payer: Self-pay | Admitting: Emergency Medicine

## 2020-02-06 ENCOUNTER — Other Ambulatory Visit: Payer: Self-pay

## 2020-02-06 DIAGNOSIS — R0602 Shortness of breath: Secondary | ICD-10-CM | POA: Diagnosis not present

## 2020-02-06 DIAGNOSIS — Z87891 Personal history of nicotine dependence: Secondary | ICD-10-CM | POA: Insufficient documentation

## 2020-02-06 NOTE — Assessment & Plan Note (Signed)
He stopped 5 years ago.  Did smoke over 30 pack years.  At this juncture lung cancer screening start at age 44.  He will have been off cigarettes for over 15 years by the time he reaches that age so he may not need/qualify for low-dose CT scanning.

## 2020-02-06 NOTE — Progress Notes (Signed)
Subjective:    Patient ID: Bruce Morales, male    DOB: 1975-11-16, 44 y.o.   MRN: [id number]  HPI 44 year old former smoker (30+ pack years) with a history of GERD, hyperlipidemia, arthritis with knee surgeries, chronic pain.  Treated for meningitis in 2017 after a laminectomy.   He describes some exertional chest pain, happens with walking. Hears some wheeze especially in the am when he wakes up. He has occasional intermittent mucous. He has intermittent dyspnea, often with lifting heavy objects. He can walk on flat ground, trouble with hills or stairs. He feels the need to sigh and get a deeper breath - restricted. His wt has been mostly stable over last few years. He has some intermittent difficulty getting food to go down, sometimes GERD sx  Pulmonary function testing 04/10/2015 reviewed by me, showed normal airflows without a bronchodilator response, normal lung volumes, slightly decreased diffusion capacity that corrects to the normal range when adjusted for his alveolar volume.   Review of Systems As per HPI  Past Medical History:  Diagnosis Date  . Anginal pain (HCC) 04/15/2014  . Arthritis    "wrists, knees, lower back" (04/16/2014)  . Carpal tunnel syndrome   . Chronic bronchitis (HCC)    "probably get it q yr"  . Chronic lower back pain   . Chronic lower back pain   . Constipation   . Gall stones   . GERD (gastroesophageal reflux disease)   . GERD (gastroesophageal reflux disease)   . HLD (hyperlipidemia)   . Joint pain   . Meningitis   . Plantar fasciitis   . Right knee pain   . Sciatica      Family History  Problem Relation Age of Onset  . Multiple sclerosis Mother   . Obesity Mother   . Heart failure Maternal Grandfather   . Diabetes Father   . Hyperlipidemia Father   . Sleep apnea Father   . Obesity Father   . Fibromyalgia Sister   . Schizophrenia Brother   . Stroke Maternal Grandmother   . Diabetes Paternal Grandmother      Social History    Socioeconomic History  . Marital status: Married    Spouse name: Janelle Floor   . Number of children: 2  . Years of education: Not on file  . Highest education level: Not on file  Occupational History  . Occupation: Disabled  Tobacco Use  . Smoking status: Former Smoker    Packs/day: 0.50    Years: 27.00    Pack years: 13.50    Types: Cigarettes    Quit date: 03/11/2015    Years since quitting: 4.9  . Smokeless tobacco: Former Clinical biochemist  . Vaping Use: Never used  Substance and Sexual Activity  . Alcohol use: Yes    Alcohol/week: 0.0 standard drinks    Comment: 04/16/2014 "a 6 pack will last me a month"  . Drug use: No    Comment: 04/16/2014 "in my teens"  . Sexual activity: Yes  Other Topics Concern  . Not on file  Social History Narrative   Drinks 2 cups of coffee in the morning.   Social Determinants of Health   Financial Resource Strain:   . Difficulty of Paying Living Expenses: Not on file  Food Insecurity:   . Worried About Programme researcher, broadcasting/film/video in the Last Year: Not on file  . Ran Out of Food in the Last Year: Not on file  Transportation Needs:   . Lack  of Transportation (Medical): Not on file  . Lack of Transportation (Non-Medical): Not on file  Physical Activity:   . Days of Exercise per Week: Not on file  . Minutes of Exercise per Session: Not on file  Stress:   . Feeling of Stress : Not on file  Social Connections:   . Frequency of Communication with Friends and Family: Not on file  . Frequency of Social Gatherings with Friends and Family: Not on file  . Attends Religious Services: Not on file  . Active Member of Clubs or Organizations: Not on file  . Attends Banker Meetings: Not on file  . Marital Status: Not on file  Intimate Partner Violence:   . Fear of Current or Ex-Partner: Not on file  . Emotionally Abused: Not on file  . Physically Abused: Not on file  . Sexually Abused: Not on file    Has lived in [place] and [place] No inhaled  exposures.   Allergies  Allergen Reactions  . Hydrocodone Hives and Itching  . Oxycodone     Mild Itching, patient can tolerate oxycodone  . Percocet [Oxycodone-Acetaminophen]     Itching, patient says he tolerates     Outpatient Medications Prior to Visit  Medication Sig Dispense Refill  . cyclobenzaprine (FLEXERIL) 10 MG tablet TAKE 1 TABLET BY MOUTH AS NEEDED 30 tablet 2  . diltiazem (CARDIZEM) 30 MG tablet TAKE 1 TABLET BY MOUTH AS NEEDED FOR RECTAL PAIN 20 tablet 2  . DUEXIS 800-26.6 MG TABS Take 1 tablet by mouth 3 (three) times daily. 90 tablet 0  . HYDROmorphone (DILAUDID) 2 MG tablet Take by mouth every 4 (four) hours as needed for severe pain.    . NUCYNTA 75 MG tablet TAKE 1 TABLET BY MOUTH EVERY 6 TO 8 HOURS AS NEEDED  0  . tiZANidine (ZANAFLEX) 4 MG tablet Take 1 tablet (4 mg total) by mouth every 8 (eight) hours as needed for muscle spasms. 30 tablet 1  . Vitamin D, Ergocalciferol, (DRISDOL) 1.25 MG (50000 UT) CAPS capsule TAKE 1 CAPSULE BY MOUTH ONE TIME PER WEEK 7 capsule 0   No facility-administered medications prior to visit.         Objective:   Physical Exam Vitals:   02/06/20 0933  BP: 118/78  Pulse: (!) 54  Temp: 98 F (36.7 C)  TempSrc: Temporal  SpO2: 98%  Weight: 250 lb 6.4 oz (113.6 kg)  Height: 5\' 9"  (1.753 m)   Gen: Pleasant, overwt man, in no distress,  normal affect  ENT: No lesions,  mouth clear,  oropharynx clear, no postnasal drip  Neck: No JVD, no stridor   Lungs: No use of accessory muscles, no crackles or wheezing on normal respiration, he coughs on a forced expiration  Cardiovascular: RRR, heart sounds normal, no murmur or gallops, no peripheral edema  Musculoskeletal: No deformities, no cyanosis or clubbing  Neuro: alert, awake, non focal  Skin: Warm, no lesions or rash     Assessment & Plan:  Dyspnea Suspect some degree of deconditioning given his reassuring pulmonary function testing from 5 years ago but certainly he  could have evolving obstruction.  He needs pulmonary function testing to compare.  Given the possible multifactorial nature of his shortness of breath I think a cardiopulmonary exercise test will be beneficial.  This will also help further evaluate his chest discomfort.  If any evidence for cardiac limitation then I will send him back to cardiology for evaluation.  History of  tobacco use He stopped 5 years ago.  Did smoke over 30 pack years.  At this juncture lung cancer screening start at age 16.  He will have been off cigarettes for over 15 years by the time he reaches that age so he may not need/qualify for low-dose CT scanning.   Levy Pupa, MD, PhD 02/06/2020, 10:10 AM [facility] [phone] or if no answer [phone]

## 2020-02-06 NOTE — Patient Instructions (Signed)
Chest x-ray today We will arrange for pulmonary function testing in next office visit We will arrange for a cardiopulmonary exercise test to better evaluate your shortness of breath We may decide to restart esophageal reflux medication at some point going forward to see if this helps with the chest discomfort depending on your testing above. Follow with Dr. Delton Coombes next available with full pulmonary function testing on the same day.

## 2020-02-06 NOTE — Assessment & Plan Note (Signed)
Suspect some degree of deconditioning given his reassuring pulmonary function testing from 5 years ago but certainly he could have evolving obstruction.  He needs pulmonary function testing to compare.  Given the possible multifactorial nature of his shortness of breath I think a cardiopulmonary exercise test will be beneficial.  This will also help [place] further evaluate his chest discomfort.  If any evidence for cardiac limitation then I will send him back to cardiology for evaluation.

## 2020-03-03 ENCOUNTER — Telehealth: Payer: Self-pay | Admitting: Emergency Medicine

## 2020-03-03 NOTE — Telephone Encounter (Signed)
03/03/2020 - PATIENT HAS A PHYSICAL SCHEDULED WITH DR. Irving Shows ON Monday (04/07/2020) AT 8:00 am. DR. Irving Shows WILL BE OUT OF THE OFFICE. I CALLED PATIENT TO RESCHEDULE. HE HAS TO CALL ME BACK. YOSSELINE GAVE ME PERMISSION TO MAKE THIS PHYSICAL APPOINTMENT WITH KELSEA JUST ON Tuesday (04/08/2020) AT 8:00 am IF THIS IS ALRIGHT WITH HIM. MBC

## 2020-03-06 ENCOUNTER — Ambulatory Visit (HOSPITAL_COMMUNITY): Payer: Medicare Other | Attending: Emergency Medicine

## 2020-03-06 ENCOUNTER — Other Ambulatory Visit: Payer: Self-pay

## 2020-03-06 DIAGNOSIS — Z87891 Personal history of nicotine dependence: Secondary | ICD-10-CM | POA: Insufficient documentation

## 2020-03-06 DIAGNOSIS — R0602 Shortness of breath: Secondary | ICD-10-CM | POA: Insufficient documentation

## 2020-03-13 ENCOUNTER — Ambulatory Visit (INDEPENDENT_AMBULATORY_CARE_PROVIDER_SITE_OTHER): Payer: Medicare Other | Admitting: Emergency Medicine

## 2020-03-13 ENCOUNTER — Other Ambulatory Visit: Payer: Self-pay

## 2020-03-13 ENCOUNTER — Encounter: Payer: Medicare Other | Admitting: Emergency Medicine

## 2020-03-13 VITALS — BP 122/82 | HR 74 | Temp 97.8°F | Ht 68.0 in | Wt 256.0 lb

## 2020-03-13 DIAGNOSIS — R0602 Shortness of breath: Secondary | ICD-10-CM

## 2020-03-13 DIAGNOSIS — Z87891 Personal history of nicotine dependence: Secondary | ICD-10-CM

## 2020-03-13 LAB — PULMONARY FUNCTION TEST
DL/VA % pred: 99 %
DL/VA: 4.66 ml/min/mmHg/L
DLCO cor % pred: 87 %
DLCO cor: 25.49 ml/min/mmHg
DLCO unc % pred: 87 %
DLCO unc: 25.49 ml/min/mmHg
FEF 25-75 Post: 5.74 L/sec
FEF 25-75 Pre: 5.21 L/sec
FEF2575-%Change-Post: 10 %
FEF2575-%Pred-Post: 153 %
FEF2575-%Pred-Pre: 139 %
FEV1-%Change-Post: 1 %
FEV1-%Pred-Post: 95 %
FEV1-%Pred-Pre: 93 %
FEV1-Post: 3.77 L
FEV1-Pre: 3.7 L
FEV1FVC-%Change-Post: 2 %
FEV1FVC-%Pred-Pre: 110 %
FEV6-%Change-Post: 0 %
FEV6-%Pred-Post: 87 %
FEV6-%Pred-Pre: 87 %
FEV6-Post: 4.21 L
FEV6-Pre: 4.22 L
FEV6FVC-%Change-Post: 0 %
FEV6FVC-%Pred-Post: 102 %
FEV6FVC-%Pred-Pre: 102 %
FVC-%Change-Post: 0 %
FVC-%Pred-Post: 85 %
FVC-%Pred-Pre: 85 %
FVC-Post: 4.21 L
FVC-Pre: 4.22 L
Post FEV1/FVC ratio: 90 %
Post FEV6/FVC ratio: 100 %
Pre FEV1/FVC ratio: 88 %
Pre FEV6/FVC Ratio: 100 %
RV % pred: 120 %
RV: 2.1 L
TLC % pred: 97 %
TLC: 6.37 L

## 2020-03-13 NOTE — Progress Notes (Signed)
PFT done today. 

## 2020-03-27 ENCOUNTER — Encounter: Payer: Medicare HMO | Admitting: Family Medicine

## 2020-04-01 ENCOUNTER — Ambulatory Visit: Payer: Medicare Other

## 2020-04-07 ENCOUNTER — Encounter: Payer: Medicare HMO | Admitting: Emergency Medicine

## 2020-04-17 ENCOUNTER — Encounter: Payer: Self-pay | Admitting: Family Medicine

## 2020-04-17 ENCOUNTER — Other Ambulatory Visit: Payer: Self-pay

## 2020-04-17 ENCOUNTER — Ambulatory Visit (INDEPENDENT_AMBULATORY_CARE_PROVIDER_SITE_OTHER): Payer: Medicare Other | Admitting: Family Medicine

## 2020-04-17 VITALS — BP 116/74 | HR 64 | Temp 97.7°F | Resp 15 | Ht 68.0 in | Wt 242.8 lb

## 2020-04-17 DIAGNOSIS — K594 Anal spasm: Secondary | ICD-10-CM

## 2020-04-17 DIAGNOSIS — G894 Chronic pain syndrome: Secondary | ICD-10-CM

## 2020-04-17 DIAGNOSIS — L600 Ingrowing nail: Secondary | ICD-10-CM

## 2020-04-17 DIAGNOSIS — Z0000 Encounter for general adult medical examination without abnormal findings: Secondary | ICD-10-CM

## 2020-04-17 DIAGNOSIS — Z0001 Encounter for general adult medical examination with abnormal findings: Secondary | ICD-10-CM

## 2020-04-17 DIAGNOSIS — Z9189 Other specified personal risk factors, not elsewhere classified: Secondary | ICD-10-CM | POA: Diagnosis not present

## 2020-04-17 MED ORDER — DILTIAZEM HCL 30 MG PO TABS: ORAL_TABLET | ORAL | 2 refills | Status: DC

## 2020-04-17 NOTE — Patient Instructions (Addendum)
   Congratulations on your weight loss.  Continue working at that.  We will let you know the results of your laboratory testing.  Except for the chronic pain issues which you are handling, I think you are generally in pretty good health.  Use the Cardizem for the proctalgia fugax (rectal pain) as needed  If the toenail is getting worse you may need to see a podiatrist for that  I do recommend that you get influenza and Covid vaccines.  I believe that all the information points to definite health and risk advantages by taking these.  Continue to seek to be physically, emotionally, relationally, and spiritually healthy.  If you have lab work done today you will be contacted with your lab results within the next 2 weeks.  If you have not heard from [place] then please contact us. The fastest way to get your results is to register for My Chart.   IF you received an x-ray today, you will receive an invoice from [facility]. Please contact [facility] at [phone] with questions or concerns regarding your invoice.   IF you received labwork today, you will receive an invoice from [facility]. Please contact [facility] at [phone] with questions or concerns regarding your invoice.   Our billing staff will not be able to assist you with questions regarding bills from these companies.  You will be contacted with the lab results as soon as they are available. The fastest way to get your results is to activate your My Chart account. Instructions are located on the last page of this paperwork. If you have not heard from [place] regarding the results in 2 weeks, please contact this office.

## 2020-04-17 NOTE — Progress Notes (Signed)
Patient ID: Bruce Morales, male    DOB: 21-Jun-1975  Age: 44 y.o. MRN: [id number]  Chief Complaint  Patient presents with  . Annual Exam    pt is doing well needs some refills also has an ingrown toenail but is doing okay otherwise     Subjective:   44 year old male here for routine annual physical examination.  No major acute complaints today.  No major new problems.  Past medical history: Surgeries: Patient has had numerous surgeries.  He had a cholecystectomy and postop bile duct leakage which caused several procedures and a long hospitalization a number of years ago. He has had numerous arthroscopic surgeries on his knees. He has had a lumbar laminectomy with postop staph (MRSA) meningitis and complications. Medical diseases: Staph sepsis with secondary renal failure and profound anemia.  Ultimately recovered from all of that. Medications: Diltiazem as needed for rectal pain and spasms Dilaudid (hydromorphone) chronic pain medication for back and neck As needed ibuprofen-famotidine Tizanidine as needed Vitamin D, vitamin C, magnesium, and potassium supplements.  Social history: Married, 2 daughters 80 and 58.  Disabled from the chronic back syndrome.  Former smoker for many years, quit 8 years ago.  Rare tiny bit of alcohol, almost insignificant.  No drug use except for the chronic pain meds.  Works around [employer] and yard and helping to meet his daughters.  Active in his church.  Good marriage.  Family history: Father 78 years old diabetic, losing weight, exercises Mother 46 with MS and poor health.  One brother was a street person who is deceased, probably alcoholic.  2 siblings living and well.  Review of systems: Constitutional: Doing okay.  He has numerous problems related to his chronic pain syndrome.  Activity therefore somewhat limited. HEENT: Occasional headaches, treated by drinking water Cardiovascular: Unremarkable Respiratory: Cough issues, sees a pulmonologist  who is going to get a CT of his chest after the turn of the year GI: Bowels are acting okay despite the chronic pain medication GU: Unremarkable Musculoskeletal: Knee pains back pain neck pain Neurologic: Possible early neuropathy symptoms in his feet. Dermatologic: Unremarkable Psychiatric: Mild levels of depression but does well, does not consider an issue at this time Endocrine: Unremarkable   Current allergies, medications, problem list, past/family and social histories reviewed.  Objective:  BP 116/74   Pulse 64   Temp 97.7 F (36.5 C) (Temporal)   Resp 15   Ht _0  (1.727 m)   Wt 242 lb 12.8 oz (110.1 kg)   SpO2 98%   BMI 36.92 kg/m   Generally healthy appearing man in no acute distress.  Gets up and moves very slowly.  TMs normal.  Eyes PERRL.  Intermittently wears glasses.  Throat clear and teeth satisfactory.  Neck supple without nodes or thyromegaly.  No carotid bruits.  Chest is clear to auscultation.  Heart regular without murmurs, gallops, or arrhythmias.  Abdomen soft masses.  Has some generalized mild nonspecific abdominal tenderness.  Normal male external genitalia with testes descended.  No hernias.  Extremities grossly unremarkable.  He does have some ingrowing of the medial aspect of the right large toenail which is pared back too far.  He had a injury to that toe many years ago and has had problems with that nail recurrently with previous avulsions.  Talk to the little about care of it.  Assessment & Plan:   Assessment: 1. Annual physical exam   2. Proctalgia fugax   3. Chronic pain syndrome  4. At risk for diabetes mellitus   5. Ingrown toenail of right foot       Plan: See instructions  Orders Placed This Encounter  Procedures  . CBC  . CMP14+EGFR  . Lipid panel  . Hemoglobin A1c  . VITAMIN D 25 Hydroxy (Vit-D Deficiency, Fractures)  . TSH    Meds ordered this encounter  Medications  . diltiazem (CARDIZEM) 30 MG tablet    Sig: Use one  daily as needed for rectal spasm pains    Dispense:  30 tablet    Refill:  2         Patient Instructions     Congratulations on your weight loss.  Continue working at that.  We will let you know the results of your laboratory testing.  Except for the chronic pain issues which you are handling, I think you are generally in pretty good health.  Use the Cardizem for the proctalgia fugax (rectal pain) as needed  If the toenail is getting worse you may need to see a podiatrist for that  I do recommend that you get influenza and Covid vaccines.  I believe that all the information points to definite health and risk advantages by taking these.  Continue to seek to be physically, emotionally, relationally, and spiritually healthy.  If you have lab work done today you will be contacted with your lab results within the next 2 weeks.  If you have not heard from [place] then please contact us. The fastest way to get your results is to register for My Chart.   IF you received an x-ray today, you will receive an invoice from [facility]. Please contact [facility] at [phone] with questions or concerns regarding your invoice.   IF you received labwork today, you will receive an invoice from [place] [facility]. Please contact [facility] at [phone] with questions or concerns regarding your invoice.   Our billing staff will not be able to assist you with questions regarding bills from these companies.  You will be contacted with the lab results as soon as they are available. The fastest way to get your results is to activate your My Chart account. Instructions are located on the last page of this paperwork. If you have not heard from [place] regarding the results in 2 weeks, please contact this office.        No follow-ups on file.   Ruben Reason, MD 04/17/2020

## 2020-04-18 LAB — CMP14+EGFR
ALT: 56 IU/L — ABNORMAL HIGH (ref 0–44)
AST: 27 IU/L (ref 0–40)
Albumin/Globulin Ratio: 2.5 — ABNORMAL HIGH (ref 1.2–2.2)
Albumin: 5 g/dL (ref 4.0–5.0)
Alkaline Phosphatase: 57 IU/L (ref 44–121)
BUN/Creatinine Ratio: 19 (ref 9–20)
BUN: 21 mg/dL (ref 6–24)
Bilirubin Total: 0.9 mg/dL (ref 0.0–1.2)
CO2: 22 mmol/L (ref 20–29)
Calcium: 9.5 mg/dL (ref 8.7–10.2)
Chloride: 102 mmol/L (ref 96–106)
Creatinine, Ser: 1.08 mg/dL (ref 0.76–1.27)
GFR calc Af Amer: 97 mL/min/{1.73_m2} (ref >59)
GFR calc non Af Amer: 84 mL/min/{1.73_m2} (ref >59)
Globulin, Total: 2 g/dL (ref 1.5–4.5)
Glucose: 84 mg/dL (ref 65–99)
Potassium: 4.7 mmol/L (ref 3.5–5.2)
Sodium: 140 mmol/L (ref 134–144)
Total Protein: 7 g/dL (ref 6.0–8.5)

## 2020-04-18 LAB — CBC
Hematocrit: 46.1 % (ref 37.5–51.0)
Hemoglobin: 15.6 g/dL (ref 13.0–17.7)
MCH: 30.1 pg (ref 26.6–33.0)
MCHC: 33.8 g/dL (ref 31.5–35.7)
MCV: 89 fL (ref 79–97)
Platelets: 141 10*3/uL — ABNORMAL LOW (ref 150–450)
RBC: 5.18 x10E6/uL (ref 4.14–5.80)
RDW: 12.8 % (ref 11.6–15.4)
WBC: 5.1 10*3/uL (ref 3.4–10.8)

## 2020-04-18 LAB — LIPID PANEL
Chol/HDL Ratio: 7.5 ratio — ABNORMAL HIGH (ref 0.0–5.0)
Cholesterol, Total: 278 mg/dL — ABNORMAL HIGH (ref 100–199)
HDL: 37 mg/dL — ABNORMAL LOW (ref >39)
LDL Chol Calc (NIH): 224 mg/dL — ABNORMAL HIGH (ref 0–99)
Triglycerides: 99 mg/dL (ref 0–149)
VLDL Cholesterol Cal: 17 mg/dL (ref 5–40)

## 2020-04-18 LAB — TSH: TSH: 2.01 u[IU]/mL (ref 0.450–4.500)

## 2020-04-18 LAB — HEMOGLOBIN A1C
Est. average glucose Bld gHb Est-mCnc: 120 mg/dL
Hgb A1c MFr Bld: 5.8 % — ABNORMAL HIGH (ref 4.8–5.6)

## 2020-04-18 LAB — VITAMIN D 25 HYDROXY (VIT D DEFICIENCY, FRACTURES): Vit D, 25-Hydroxy: 60.2 ng/mL (ref 30.0–100.0)

## 2020-04-18 NOTE — Progress Notes (Signed)
Call patient:Most of your labs look pretty good.  Your sugar level is good although your hemoglobin A1c is still a little high at 5.8 (should be less than 5.6, so this is in the prediabetic range still, about the same that it has been for the last 2 years.).Your vitamin D level is excellent now at 60, up from a previous 27.  Your thyroid is normal.The one item of concern is that your cholesterol has gone up significantly to 278 from 205.  I am not going to put you on medicine quite yet since you are losing weight.  It is important to keep doing so.  However would like to see you come back in about 4 months for a recheck and repeat of your lipid studies.  If it is still high you will need to be on some medication for this.Sandria Bales. Alwyn Ren, MD

## 2020-05-28 ENCOUNTER — Telehealth: Payer: Self-pay | Admitting: Emergency Medicine

## 2020-05-28 DIAGNOSIS — Z87891 Personal history of nicotine dependence: Secondary | ICD-10-CM

## 2020-05-28 NOTE — Telephone Encounter (Signed)
I reviewed my note from our office visit, thought we had established that he did not qualify for LDCT.  Then I see that it is ordered - I don't recall the discussion we had regarding setting up, why we went ahead and ordered.

## 2020-05-28 NOTE — Telephone Encounter (Signed)
Spoke with Lurena Joiner at [facility] . She clarified that they cannot perform low dose CT on this pt due to pt's current age of 66. She communicated with Dr Delton Coombes via staff message and Dr Kavin Leech response was as follows:   From: Leslye Peer, MD  Sent: 05/27/2020  1:20 PM EST  To: Salvadore Farber   Based on my review of his last note from September, I don't think I was going to get a CT on this patient. I think we can cancel it. RB  ----- Message -----  From: Salvadore Farber  Sent: 05/27/2020  9:30 AM EST  To: Leslye Peer, MD   Hello , I want to change this to a CT Chest without, this patient does not qualify for the lung cancer screening program. He is only 44. I'm happy to change the order in epic with your permission.   Dr Delton Coombes please document in this note your recommendations regarding Chest Ct on this pt. Pt is aware that he does not qualify for low dose screening CT but has question about getting a CT Chest w/o contrast.

## 2020-05-28 NOTE — Telephone Encounter (Signed)
Spoke with pt. He states Dr Delton Coombes had ordered for him to have a lung cancer screening CT and it is schedule at [facility] for 06/03/20. Pt states that [facility] called him stating that they were going to cancel this appt due to his age of 59 not qualifying him for the CT. Pt was willing to pay out of pocket for the CT buy they still told pt that they cannot perform the CT.  I have left a message for Lurena Joiner at [facility] to call me to discuss. Pt is aware that I will call him back once I have spoken to [place].

## 2020-05-28 NOTE — Telephone Encounter (Signed)
I went back through his chart to see why I might have ordered a LDCT. I can't find any indication to do it. I'm sorry about the confusion.

## 2020-06-03 ENCOUNTER — Inpatient Hospital Stay: Admission: RE | Admit: 2020-06-03 | Payer: Medicare Other | Source: Ambulatory Visit

## 2020-06-03 NOTE — Telephone Encounter (Signed)
I spoke with pt and advised of the recommendations per Dr Delton Coombes. Pt still wants to know why he cannot have a Chest CT w/o contrast. Pt asked to speak with Dr Delton Coombes. I advised pt that Dr Delton Coombes would not be back in the office until Thursday, 06/05/20 and he may not receive a call back until that time. Pt verbalized understanding and stated that he would wait to hear back from Dr Delton Coombes. Message forwarded to Dr Delton Coombes.

## 2020-06-06 NOTE — Telephone Encounter (Signed)
I spoke with the patient to clarify - he understands that he doesn't qualify for coverage of LDCT for screening. He is interested in getting the scan and paying out of pocket. I told him that I think we can order that but that I would need to check to insure that there are no barriers.   Angelique Blonder, am I correct that I can order and that he can pay out of pocket?

## 2020-06-09 NOTE — Telephone Encounter (Signed)
I spoke with Lurena Joiner at [facility] [phone]) to clarify. She states that her documentation shows that if patient qualifies but their insurance doesn't cover LDCT then they can pay out  of pocket. But if patient doesn't qualify due to age or smoking history  and still wants a CT then they do a Chest CT w/o contrast.

## 2020-06-11 NOTE — Telephone Encounter (Signed)
Please order a CT chest without contrast for this patient, dx is dyspnea.

## 2020-06-11 NOTE — Addendum Note (Signed)
Addended by: Dorisann Frames R on: 06/11/2020 05:08 PM   Modules accepted: Orders

## 2020-06-11 NOTE — Telephone Encounter (Signed)
Spoke with pt and informed him CT order had been placed and he should be hearing from [facility] soon. Pt stated understanding. Nothing further needed at this time

## 2020-07-09 ENCOUNTER — Other Ambulatory Visit: Payer: Self-pay | Admitting: Family Medicine

## 2020-07-09 DIAGNOSIS — K594 Anal spasm: Secondary | ICD-10-CM

## 2020-07-09 NOTE — Telephone Encounter (Signed)
Requested Prescriptions  Pending Prescriptions Disp Refills  . diltiazem (CARDIZEM) 30 MG tablet [Pharmacy Med Name: DILTIAZEM 30 MG TABLET] 90 tablet 1    Sig: TAKE 1 TABLET EVERY DAY AS NEEDED FOR RECTAL SPASM PAINS     Cardiovascular:  Calcium Channel Blockers Passed - 07/09/2020  1:37 AM      Passed - Last BP in normal range    BP Readings from Last 1 Encounters:  04/17/20 116/74         Passed - Valid encounter within last 6 months    Recent Outpatient Visits          2 months ago Annual physical exam   Primary Care at [facility], Sandria Bales, MD   1 year ago Routine general medical examination at a health care facility   Primary Care at [facility], [name], MD   1 year ago Suspected COVID-19 virus infection   Primary Care at [facility], [name], MD   1 year ago Proctalgia fugax   Primary Care at [facility], [name], MD   2 years ago Influenza   Primary Care at [facility], [place], [place]

## 2020-12-16 ENCOUNTER — Other Ambulatory Visit (HOSPITAL_COMMUNITY): Payer: Self-pay | Admitting: Family Medicine

## 2020-12-16 ENCOUNTER — Other Ambulatory Visit (HOSPITAL_BASED_OUTPATIENT_CLINIC_OR_DEPARTMENT_OTHER): Payer: Self-pay | Admitting: Family Medicine

## 2020-12-16 DIAGNOSIS — E7800 Pure hypercholesterolemia, unspecified: Secondary | ICD-10-CM

## 2020-12-19 ENCOUNTER — Ambulatory Visit (HOSPITAL_BASED_OUTPATIENT_CLINIC_OR_DEPARTMENT_OTHER)
Admission: RE | Admit: 2020-12-19 | Discharge: 2020-12-19 | Disposition: A | Discharge status: Home / Self Care | Payer: Medicare Other | Source: Ambulatory Visit | Attending: Family Medicine | Admitting: Family Medicine

## 2020-12-19 ENCOUNTER — Other Ambulatory Visit: Payer: Self-pay

## 2020-12-19 DIAGNOSIS — E7800 Pure hypercholesterolemia, unspecified: Secondary | ICD-10-CM | POA: Insufficient documentation

## 2021-01-03 ENCOUNTER — Other Ambulatory Visit: Payer: Self-pay | Admitting: Emergency Medicine

## 2021-01-03 DIAGNOSIS — K594 Anal spasm: Secondary | ICD-10-CM

## 2021-03-27 ENCOUNTER — Telehealth: Payer: Self-pay | Admitting: Neurology

## 2021-03-27 ENCOUNTER — Telehealth: Payer: Medicare Other | Admitting: Physician Assistant

## 2021-03-27 DIAGNOSIS — R2990 Unspecified symptoms and signs involving the nervous system: Secondary | ICD-10-CM

## 2021-03-27 NOTE — Patient Instructions (Signed)
  [name], thank you for joining Margaretann Loveless, PA-C for today's virtual visit.  While this provider is not your primary care provider (PCP), if your PCP is located in our provider database this encounter information will be shared with them immediately following your visit.  Consent: (Patient) [name] provided verbal consent for this virtual visit at the beginning of the encounter.  Current Medications:  Current Outpatient Medications:    cyclobenzaprine (FLEXERIL) 10 MG tablet, TAKE 1 TABLET BY MOUTH AS NEEDED (Patient not taking: Reported on 04/17/2020), Disp: 30 tablet, Rfl: 2   diltiazem (CARDIZEM) 30 MG tablet, TAKE 1 TABLET EVERY DAY AS NEEDED FOR RECTAL SPASM PAINS, Disp: 90 tablet, Rfl: 1   DUEXIS 800-26.6 MG TABS, Take 1 tablet by mouth 3 (three) times daily., Disp: 90 tablet, Rfl: 0   HYDROmorphone (DILAUDID) 2 MG tablet, Take by mouth every 4 (four) hours as needed for severe pain., Disp: , Rfl:    NUCYNTA 75 MG tablet, TAKE 1 TABLET BY MOUTH EVERY 6 TO 8 HOURS AS NEEDED (Patient not taking: Reported on 04/17/2020), Disp: , Rfl: 0   tiZANidine (ZANAFLEX) 4 MG tablet, Take 1 tablet (4 mg total) by mouth every 8 (eight) hours as needed for muscle spasms., Disp: 30 tablet, Rfl: 1   Vitamin D, Ergocalciferol, (DRISDOL) 1.25 MG (50000 UT) CAPS capsule, TAKE 1 CAPSULE BY MOUTH ONE TIME PER WEEK (Patient not taking: Reported on 04/17/2020), Disp: 7 capsule, Rfl: 0   Medications ordered in this encounter:  No orders of the defined types were placed in this encounter.    *If you need refills on other medications prior to your next appointment, please contact your pharmacy*  Follow-Up: Call back or seek an in-person evaluation if the symptoms worsen or if the condition fails to improve as anticipated.  Other Instructions Speak with pain management or PCP for Neurology referral   If you have been instructed to have an in-person evaluation today at a local  Urgent Care facility, please use the link below. It will take you to a list of all of our available [facility], including address, phone number and hours of operation. Please do not delay care.  [facility]  If you or a family member do not have a primary care provider, use the link below to schedule a visit and establish care. When you choose a [facility] primary care physician or advanced practice provider, you gain a long-term partner in health. Find a Primary Care Provider  Learn more about [facility] in-office and virtual care options: [facility] - Get Care Now

## 2021-03-27 NOTE — Telephone Encounter (Signed)
Patient paged the on call doctor today and stated that he is having worsening pan symptoms from an accident he had in 2016.  He saw Dr. Vickey Huger in 2016, he has not been back to our office in more than 6 years, states he called our office this morning(there is no record of that) and has not heard back which is upsetting because he has worsening pain symptoms RIGHT NOW.  I politely and empathetically explained to patient I cannot give him any advice or schedule an appointment at this time, he has not been seen in 6 years, if he is having acute symptoms he has to go to the emergency room.  I also told him he has to go to his primary care for evaluation and get a new referral(per pcp judgement) given the extended amount of time that he has not been seen in our practice.  Patient was upset by this and stated he does not want to come to our practice if we are going to give him the run around and make him jump through hoops and stick his thumb through his elbow and more of a long unpleasant diatribe that I am not going to document here.  I explained to patient that we work extremely hard, we try our best to help patients, since we haven't seen him it is unsafe for me to give him any advice,  we not an urgent care center, and I am sorry that he is so unhappy that he cannot get acute treatment or advice in our office today late on a Friday after not being seen for > 6 years.  I would enthusiastically agree with Bruce Morales that we do not want him coming back to our practice either.  Since he is technically a prior patient of Bruce Morales, if he comes back to the office he will be seeing her, I will let Dr. Vickey Huger and management decide whether this patient should be informed formally that he needs to go to a different neurology practice at this time. If a new referral comes in, I would refuse to see him as a patient.

## 2021-03-27 NOTE — Progress Notes (Signed)
Virtual Visit Consent   Bruce Morales, you are scheduled for a virtual visit with a [facility] provider today.     Just as with appointments in the office, your consent must be obtained to participate.  Your consent will be active for this visit and any virtual visit you may have with one of our providers in the next 365 days.     If you have a MyChart account, a copy of this consent can be sent to you electronically.  All virtual visits are billed to your insurance company just like a traditional visit in the office.    As this is a virtual visit, video technology does not allow for your provider to perform a traditional examination.  This may limit your provider's ability to fully assess your condition.  If your provider identifies any concerns that need to be evaluated in person or the need to arrange testing (such as labs, EKG, etc.), we will make arrangements to do so.     Although advances in technology are sophisticated, we cannot ensure that it will always work on either your end or our end.  If the connection with a video visit is poor, the visit may have to be switched to a telephone visit.  With either a video or telephone visit, we are not always able to ensure that we have a secure connection.     I need to obtain your verbal consent now.   Are you willing to proceed with your visit today?    Bruce Morales has provided verbal consent on 03/27/2021 for a virtual visit (video or telephone).   Margaretann Loveless, PA-C   Date: 03/27/2021 3:57 PM   Virtual Visit via Video Note   I, Margaretann Loveless, connected with  Bruce Morales  ([id number], 45) on 03/27/21 at  4:00 PM EDT by a video-enabled telemedicine application and verified that I am speaking with the correct person using two identifiers.  Location: Patient: Virtual Visit Location Patient: Home Provider: Virtual Visit Location Provider: Home Office   I discussed the limitations of evaluation and  management by telemedicine and the availability of in person appointments. The patient expressed understanding and agreed to proceed.    History of Present Illness: Bruce Morales is a 45 y.o. who identifies as a male who was assigned male at birth, and is being seen today for back pain, neck pain, worsening neuropathy, and worsening, uncontrollable muscle spasms. He had an accident in 2016 that lead to a back surgery that was complicated by spinal meningitis post op. Since he has been under pain management supervision but is having some worsening neurological symptoms. His mother has MS. He would like to see a Neurologist for further evaluation.    Problems:  Patient Active Problem List   Diagnosis Date Noted   History of tobacco use 02/06/2020   Other hyperlipidemia 07/18/2018   Depression 07/18/2018   Prediabetes 05/17/2018   Vitamin D deficiency 05/17/2018   Hearing loss 09/04/2015   Bone marrow suppression    Thrombocytopenia (HCC)    Pancytopenia (HCC) 06/24/2015   Status post lumbar laminectomy 06/10/2015   Dyspnea 03/20/2015   Class 2 severe obesity with serious comorbidity and body mass index (BMI) of 38.0 to 38.9 in adult (HCC) 11/13/2014    Allergies:  Allergies  Allergen Reactions   Other Hives and Itching   Hydrocodone Hives and Itching   Oxycodone     Mild Itching, patient can tolerate oxycodone  Percocet [Oxycodone-Acetaminophen]     Itching, patient says he tolerates   Medications:  Current Outpatient Medications:    cyclobenzaprine (FLEXERIL) 10 MG tablet, TAKE 1 TABLET BY MOUTH AS NEEDED (Patient not taking: Reported on 04/17/2020), Disp: 30 tablet, Rfl: 2   diltiazem (CARDIZEM) 30 MG tablet, TAKE 1 TABLET EVERY DAY AS NEEDED FOR RECTAL SPASM PAINS, Disp: 90 tablet, Rfl: 1   DUEXIS 800-26.6 MG TABS, Take 1 tablet by mouth 3 (three) times daily., Disp: 90 tablet, Rfl: 0   HYDROmorphone (DILAUDID) 2 MG tablet, Take by mouth every 4 (four) hours as needed for  severe pain., Disp: , Rfl:    NUCYNTA 75 MG tablet, TAKE 1 TABLET BY MOUTH EVERY 6 TO 8 HOURS AS NEEDED (Patient not taking: Reported on 04/17/2020), Disp: , Rfl: 0   tiZANidine (ZANAFLEX) 4 MG tablet, Take 1 tablet (4 mg total) by mouth every 8 (eight) hours as needed for muscle spasms., Disp: 30 tablet, Rfl: 1   Vitamin D, Ergocalciferol, (DRISDOL) 1.25 MG (50000 UT) CAPS capsule, TAKE 1 CAPSULE BY MOUTH ONE TIME PER WEEK (Patient not taking: Reported on 04/17/2020), Disp: 7 capsule, Rfl: 0  Observations/Objective: Patient is well-developed, well-nourished in no acute distress.  Resting comfortably at home.  Head is normocephalic, atraumatic.  No labored breathing.  Speech is clear and coherent with logical content.  Patient is alert and oriented at baseline.    Assessment and Plan: 1. Neurological symptoms  - Patient desiring referral to Neurology for different symptoms and FHx of MS in mother.  - Advised we are unable to refer at this time and would recommend he discuss with pain management or PCP  Follow Up Instructions: I discussed the assessment and treatment plan with the patient. The patient was provided an opportunity to ask questions and all were answered. The patient agreed with the plan and demonstrated an understanding of the instructions.  A copy of instructions were sent to the patient via MyChart unless otherwise noted below.    The patient was advised to call back or seek an in-person evaluation if the symptoms worsen or if the condition fails to improve as anticipated.  Time:  I spent 8 minutes with the patient via telehealth technology discussing the above problems/concerns.    Margaretann Loveless, PA-C

## 2021-03-30 ENCOUNTER — Encounter: Payer: Self-pay | Admitting: Physician Assistant

## 2021-03-30 ENCOUNTER — Telehealth: Payer: Self-pay | Admitting: Neurology

## 2021-03-30 ENCOUNTER — Telehealth: Payer: Medicare Other | Admitting: Physician Assistant

## 2021-03-30 NOTE — Telephone Encounter (Signed)
Last message per RN from this patient 12-2014:   "I called patient to schedule his HST and patient states he is not going to do the test.  He has gone to another neurologist and has relief from his back pain so he is sleeping well now".  He is not to return to [facility]. Dismiss - CD

## 2021-03-30 NOTE — Progress Notes (Signed)
  Pt is attempting to complete visit for his daughter however is under his own mychart. I am unable to see any of the actual patients information and advised that he needs to create a visit in the patients chart. He will contact IT  Pt will not be billed for visit

## 2021-03-30 NOTE — Telephone Encounter (Signed)
Agree with Dr Lucia Gaskins. I am not treating chronic pain, and I will not take him into the clinic. His expectation is unreasonable and likely driven by dependency on opiates.   He has options for pain treatment and there is no neurological need- would have been in treatment in the 6 year-hiatus, see HYDROMORPHONE prescription.   We do not see workmen's comp cases anyway. CD

## 2021-03-31 ENCOUNTER — Encounter: Payer: Self-pay | Admitting: Neurology

## 2021-04-07 ENCOUNTER — Other Ambulatory Visit: Payer: Self-pay | Admitting: Family Medicine

## 2021-04-07 DIAGNOSIS — R270 Ataxia, unspecified: Secondary | ICD-10-CM

## 2021-04-30 ENCOUNTER — Ambulatory Visit
Admission: RE | Admit: 2021-04-30 | Discharge: 2021-04-30 | Disposition: A | Discharge status: Home / Self Care | Payer: Medicare Other | Source: Ambulatory Visit | Attending: Family Medicine | Admitting: Family Medicine

## 2021-04-30 DIAGNOSIS — R270 Ataxia, unspecified: Secondary | ICD-10-CM

## 2021-04-30 MED ORDER — GADOBENATE DIMEGLUMINE 529 MG/ML IV SOLN
20.0000 mL | Freq: Once | INTRAVENOUS | Status: AC | PRN
  Administered 2021-04-30: 20 mL via INTRAVENOUS

## 2021-05-04 ENCOUNTER — Other Ambulatory Visit: Payer: Self-pay

## 2021-05-04 ENCOUNTER — Ambulatory Visit
Admission: RE | Admit: 2021-05-04 | Discharge: 2021-05-04 | Disposition: A | Discharge status: Home / Self Care | Payer: Medicare Other | Source: Ambulatory Visit | Attending: Family Medicine | Admitting: Family Medicine

## 2021-05-04 DIAGNOSIS — R270 Ataxia, unspecified: Secondary | ICD-10-CM

## 2021-05-04 MED ORDER — GADOBENATE DIMEGLUMINE 529 MG/ML IV SOLN
20.0000 mL | Freq: Once | INTRAVENOUS | Status: AC | PRN
  Administered 2021-05-04: 20 mL via INTRAVENOUS

## 2021-05-07 ENCOUNTER — Telehealth: Payer: Medicare Other | Admitting: Physician Assistant

## 2021-05-07 DIAGNOSIS — Z20828 Contact with and (suspected) exposure to other viral communicable diseases: Secondary | ICD-10-CM | POA: Diagnosis not present

## 2021-05-07 DIAGNOSIS — R6889 Other general symptoms and signs: Secondary | ICD-10-CM

## 2021-05-07 MED ORDER — XOFLUZA (80 MG DOSE) 1 X 80 MG PO TBPK: 80.0000 mg | ORAL_TABLET | Freq: Once | ORAL | 0 refills | Status: AC

## 2021-05-07 NOTE — Progress Notes (Signed)
Virtual Visit Consent   Bruce Morales, you are scheduled for a virtual visit with a [facility] provider today.     Just as with appointments in the office, your consent must be obtained to participate.  Your consent will be active for this visit and any virtual visit you may have with one of our providers in the next 365 days.     If you have a MyChart account, a copy of this consent can be sent to you electronically.  All virtual visits are billed to your insurance company just like a traditional visit in the office.    As this is a virtual visit, video technology does not allow for your provider to perform a traditional examination.  This may limit your provider's ability to fully assess your condition.  If your provider identifies any concerns that need to be evaluated in person or the need to arrange testing (such as labs, EKG, etc.), we will make arrangements to do so.     Although advances in technology are sophisticated, we cannot ensure that it will always work on either your end or our end.  If the connection with a video visit is poor, the visit may have to be switched to a telephone visit.  With either a video or telephone visit, we are not always able to ensure that we have a secure connection.     I need to obtain your verbal consent now.   Are you willing to proceed with your visit today?    Bruce Morales has provided verbal consent on 05/07/2021 for a virtual visit (video or telephone).   Piedad Climes, [place]   Date: 05/07/2021 10:12 AM   Virtual Visit via Video Note   I, Piedad Climes, connected with  Bruce Morales  ([id number], April 30, 1976) on 05/07/21 at 10:00 AM EST by a video-enabled telemedicine application and verified that I am speaking with the correct person using two identifiers.  Location: Patient: Virtual Visit Location Patient: Home Provider: Virtual Visit Location Provider: Home Office   I discussed the limitations of evaluation and  management by telemedicine and the availability of in person appointments. The patient expressed understanding and agreed to proceed.    History of Present Illness: Bruce Morales is a 45 y.o. who identifies as a male who was assigned male at birth, and is being seen today for URI symptoms starting yesterday. Notes body aches, sore throat, rhinorrhea and nasal congestion. Noted fever last night with some chills. Denies chest pain or SOB.  Daughter testing positive for flu with symptoms starting a few days ago and testing positive. He has been taking care of her the past few days. Now his 43 year-old is having similar symptoms.  Has not taken anything yet for symptoms.   HPI: HPI  Problems:  Patient Active Problem List   Diagnosis Date Noted   History of tobacco use 02/06/2020   Other hyperlipidemia 07/18/2018   Depression 07/18/2018   Prediabetes 05/17/2018   Vitamin D deficiency 05/17/2018   Hearing loss 09/04/2015   Bone marrow suppression    Thrombocytopenia (HCC)    Pancytopenia (HCC) 06/24/2015   Status post lumbar laminectomy 06/10/2015   Dyspnea 03/20/2015   Class 2 severe obesity with serious comorbidity and body mass index (BMI) of 38.0 to 38.9 in adult [facility]) 11/13/2014    Allergies:  Allergies  Allergen Reactions   Other Hives and Itching   Hydrocodone Hives and Itching   Oxycodone  Mild Itching, patient can tolerate oxycodone   Percocet [Oxycodone-Acetaminophen]     Itching, patient says he tolerates   Medications:  Current Outpatient Medications:    Baloxavir Marboxil,80 MG Dose, (XOFLUZA, 80 MG DOSE,) 1 x 80 MG TBPK, Take 80 mg by mouth once for 1 dose., Disp: 1 each, Rfl: 0   cyclobenzaprine (FLEXERIL) 10 MG tablet, TAKE 1 TABLET BY MOUTH AS NEEDED (Patient not taking: Reported on 04/17/2020), Disp: 30 tablet, Rfl: 2   diltiazem (CARDIZEM) 30 MG tablet, TAKE 1 TABLET EVERY DAY AS NEEDED FOR RECTAL SPASM PAINS, Disp: 90 tablet, Rfl: 1   DUEXIS 800-26.6 MG  TABS, Take 1 tablet by mouth 3 (three) times daily., Disp: 90 tablet, Rfl: 0   HYDROmorphone (DILAUDID) 2 MG tablet, Take by mouth every 4 (four) hours as needed for severe pain., Disp: , Rfl:    NUCYNTA 75 MG tablet, TAKE 1 TABLET BY MOUTH EVERY 6 TO 8 HOURS AS NEEDED (Patient not taking: Reported on 04/17/2020), Disp: , Rfl: 0   tiZANidine (ZANAFLEX) 4 MG tablet, Take 1 tablet (4 mg total) by mouth every 8 (eight) hours as needed for muscle spasms., Disp: 30 tablet, Rfl: 1   Vitamin D, Ergocalciferol, (DRISDOL) 1.25 MG (50000 UT) CAPS capsule, TAKE 1 CAPSULE BY MOUTH ONE TIME PER WEEK (Patient not taking: Reported on 04/17/2020), Disp: 7 capsule, Rfl: 0  Observations/Objective: Patient is well-developed, well-nourished in no acute distress.  Resting comfortably at home.  Head is normocephalic, atraumatic.  No labored breathing. Speech is clear and coherent with logical content.  Patient is alert and oriented at baseline.   Assessment and Plan: 1. Flu-like symptoms - Baloxavir Marboxil,80 MG Dose, (XOFLUZA, 80 MG DOSE,) 1 x 80 MG TBPK; Take 80 mg by mouth once for 1 dose.  Dispense: 1 each; Refill: 0  2. Exposure to the flu - Baloxavir Marboxil,80 MG Dose, (XOFLUZA, 80 MG DOSE,) 1 x 80 MG TBPK; Take 80 mg by mouth once for 1 dose.  Dispense: 1 each; Refill: 0 Known exposure with classic symptoms. Higher risk of complications. Will start Xofluza at patient request. Rx sent to pharmacy. Supportive measures, OTC medications reviewed.   Follow Up Instructions: I discussed the assessment and treatment plan with the patient. The patient was provided an opportunity to ask questions and all were answered. The patient agreed with the plan and demonstrated an understanding of the instructions.  A copy of instructions were sent to the patient via MyChart unless otherwise noted below.   The patient was advised to call back or seek an in-person evaluation if the symptoms worsen or if the condition fails  to improve as anticipated.  Time:  I spent 14 minutes with the patient via telehealth technology discussing the above problems/concerns.    Piedad Climes, PA-C

## 2021-05-07 NOTE — Patient Instructions (Signed)
  Bruce Morales, thank you for joining Piedad Climes, PA-C for today's virtual visit.  While this provider is not your primary care provider (PCP), if your PCP is located in our provider database this encounter information will be shared with them immediately following your visit.  Consent: (Patient) Bruce Morales provided verbal consent for this virtual visit at the beginning of the encounter.  Current Medications:  Current Outpatient Medications:    cyclobenzaprine (FLEXERIL) 10 MG tablet, TAKE 1 TABLET BY MOUTH AS NEEDED (Patient not taking: Reported on 04/17/2020), Disp: 30 tablet, Rfl: 2   diltiazem (CARDIZEM) 30 MG tablet, TAKE 1 TABLET EVERY DAY AS NEEDED FOR RECTAL SPASM PAINS, Disp: 90 tablet, Rfl: 1   DUEXIS 800-26.6 MG TABS, Take 1 tablet by mouth 3 (three) times daily., Disp: 90 tablet, Rfl: 0   HYDROmorphone (DILAUDID) 2 MG tablet, Take by mouth every 4 (four) hours as needed for severe pain., Disp: , Rfl:    NUCYNTA 75 MG tablet, TAKE 1 TABLET BY MOUTH EVERY 6 TO 8 HOURS AS NEEDED (Patient not taking: Reported on 04/17/2020), Disp: , Rfl: 0   tiZANidine (ZANAFLEX) 4 MG tablet, Take 1 tablet (4 mg total) by mouth every 8 (eight) hours as needed for muscle spasms., Disp: 30 tablet, Rfl: 1   Vitamin D, Ergocalciferol, (DRISDOL) 1.25 MG (50000 UT) CAPS capsule, TAKE 1 CAPSULE BY MOUTH ONE TIME PER WEEK (Patient not taking: Reported on 04/17/2020), Disp: 7 capsule, Rfl: 0   Medications ordered in this encounter:  No orders of the defined types were placed in this encounter.    *If you need refills on other medications prior to your next appointment, please contact your pharmacy*  Follow-Up: Call back or seek an in-person evaluation if the symptoms worsen or if the condition fails to improve as anticipated.  Other Instructions Please keep well-hydrated and get plenty of rest. Start a saline nasal rinse to help with nasal congestion. Use your typical OTC pain  reliever/fever reducer if needed for temperature.  Start plain Mucinex OTC Continue vitamins. Take the Xofluza as directed.   If you have been instructed to have an in-person evaluation today at a local Urgent Care facility, please use the link below. It will take you to a list of all of our available [facility], including address, phone number and hours of operation. Please do not delay care.  [facility]  If you or a family member do not have a primary care provider, use the link below to schedule a visit and establish care. When you choose a [facility] primary care physician or advanced practice provider, you gain a long-term partner in health. Find a Primary Care Provider  Learn more about [facility]'s in-office and virtual care options: [facility] - Get Care Now

## 2021-07-01 NOTE — Progress Notes (Signed)
Erroneous encounter

## 2021-07-21 ENCOUNTER — Telehealth: Payer: Medicare Other | Admitting: Physician Assistant

## 2021-07-21 DIAGNOSIS — J019 Acute sinusitis, unspecified: Secondary | ICD-10-CM | POA: Diagnosis not present

## 2021-07-21 DIAGNOSIS — H66001 Acute suppurative otitis media without spontaneous rupture of ear drum, right ear: Secondary | ICD-10-CM

## 2021-07-21 DIAGNOSIS — B9689 Other specified bacterial agents as the cause of diseases classified elsewhere: Secondary | ICD-10-CM | POA: Diagnosis not present

## 2021-07-21 MED ORDER — AMOXICILLIN-POT CLAVULANATE 875-125 MG PO TABS: 1.0000 | ORAL_TABLET | Freq: Two times a day (BID) | ORAL | 0 refills | Status: DC

## 2021-07-21 NOTE — Progress Notes (Signed)
Virtual Visit Consent   Bruce Morales, you are scheduled for a virtual visit with a [facility] provider today.     Just as with appointments in the office, your consent must be obtained to participate.  Your consent will be active for this visit and any virtual visit you may have with one of our providers in the next 365 days.     If you have a MyChart account, a copy of this consent can be sent to you electronically.  All virtual visits are billed to your insurance company just like a traditional visit in the office.    As this is a virtual visit, video technology does not allow for your provider to perform a traditional examination.  This may limit your provider's ability to fully assess your condition.  If your provider identifies any concerns that need to be evaluated in person or the need to arrange testing (such as labs, EKG, etc.), we will make arrangements to do so.     Although advances in technology are sophisticated, we cannot ensure that it will always work on either your end or our end.  If the connection with a video visit is poor, the visit may have to be switched to a telephone visit.  With either a video or telephone visit, we are not always able to ensure that we have a secure connection.     I need to obtain your verbal consent now.   Are you willing to proceed with your visit today?    Bruce Morales has provided verbal consent on 07/21/2021 for a virtual visit (video or telephone).   Bruce Daring, PA-C   Date: 07/21/2021 8:40 AM   Virtual Visit via Video Note   I, Bruce Morales, connected with  [name]  ([id number], [birth year]) on 07/21/21 at  8:30 AM EST by a video-enabled telemedicine application and verified that I am speaking with the correct person using two identifiers.  Location: Patient: Virtual Visit Location Patient: Home Provider: Virtual Visit Location Provider: Home Office   I discussed the limitations of evaluation and  management by telemedicine and the availability of in person appointments. The patient expressed understanding and agreed to proceed.    History of Present Illness: Bruce Morales is a 46 y.o. who identifies as a male who was assigned male at birth, and is being seen today for sinus congestion and sore throat.  HPI: URI  This is a new problem. The current episode started in the past 7 days. The problem has been gradually worsening. Associated symptoms include congestion, ear pain (right), headaches, rhinorrhea, sinus pain and a sore throat. Pertinent negatives include no coughing or plugged ear sensation. Treatments tried: nettipot, flonase, pseudoephedrine nasal spray. The treatment provided no relief.     Problems:  Patient Active Problem List   Diagnosis Date Noted   History of tobacco use 02/06/2020   Other hyperlipidemia 07/18/2018   Depression 07/18/2018   Prediabetes 05/17/2018   Vitamin D deficiency 05/17/2018   Hearing loss 09/04/2015   Bone marrow suppression    Thrombocytopenia (HCC)    Pancytopenia ([place]) 06/24/2015   Status post lumbar laminectomy 06/10/2015   Dyspnea 03/20/2015   Class 2 severe obesity with serious comorbidity and body mass index (BMI) of 38.0 to 38.9 in adult ([place]) 11/13/2014    Allergies:  Allergies  Allergen Reactions   Other Hives and Itching   Hydrocodone Hives and Itching   Oxycodone  Mild Itching, patient can tolerate oxycodone   Percocet [Oxycodone-Acetaminophen]     Itching, patient says he tolerates   Medications:  Current Outpatient Medications:    amoxicillin-clavulanate (AUGMENTIN) 875-125 MG tablet, Take 1 tablet by mouth 2 (two) times daily., Disp: 14 tablet, Rfl: 0   cyclobenzaprine (FLEXERIL) 10 MG tablet, TAKE 1 TABLET BY MOUTH AS NEEDED (Patient not taking: Reported on 04/17/2020), Disp: 30 tablet, Rfl: 2   diltiazem (CARDIZEM) 30 MG tablet, TAKE 1 TABLET EVERY DAY AS NEEDED FOR RECTAL SPASM PAINS, Disp: 90 tablet, Rfl:  1   DUEXIS 800-26.6 MG TABS, Take 1 tablet by mouth 3 (three) times daily., Disp: 90 tablet, Rfl: 0   HYDROmorphone (DILAUDID) 2 MG tablet, Take by mouth every 4 (four) hours as needed for severe pain., Disp: , Rfl:    NUCYNTA 75 MG tablet, TAKE 1 TABLET BY MOUTH EVERY 6 TO 8 HOURS AS NEEDED (Patient not taking: Reported on 04/17/2020), Disp: , Rfl: 0   tiZANidine (ZANAFLEX) 4 MG tablet, Take 1 tablet (4 mg total) by mouth every 8 (eight) hours as needed for muscle spasms., Disp: 30 tablet, Rfl: 1   Vitamin D, Ergocalciferol, (DRISDOL) 1.25 MG (50000 UT) CAPS capsule, TAKE 1 CAPSULE BY MOUTH ONE TIME PER WEEK (Patient not taking: Reported on 04/17/2020), Disp: 7 capsule, Rfl: 0  Observations/Objective: Patient is well-developed, well-nourished in no acute distress.  Resting comfortably at home.  Head is normocephalic, atraumatic.  No labored breathing.  Speech is clear and coherent with logical content.  Patient is alert and oriented at baseline.    Assessment and Plan: 1. Acute bacterial sinusitis - amoxicillin-clavulanate (AUGMENTIN) 875-125 MG tablet; Take 1 tablet by mouth 2 (two) times daily.  Dispense: 14 tablet; Refill: 0  2. Non-recurrent acute suppurative otitis media of right ear without spontaneous rupture of tympanic membrane - amoxicillin-clavulanate (AUGMENTIN) 875-125 MG tablet; Take 1 tablet by mouth 2 (two) times daily.  Dispense: 14 tablet; Refill: 0  - Worsening symptoms that have not responded to OTC medications.  - Will give augmentin as below.  - Continue allergy medications, saline nasal rinses - Stay well hydrated and get plenty of rest.  - Call if no symptom improvement or if symptoms worsen.  Follow Up Instructions: I discussed the assessment and treatment plan with the patient. The patient was provided an opportunity to ask questions and all were answered. The patient agreed with the plan and demonstrated an understanding of the instructions.  A copy of  instructions were sent to the patient via MyChart unless otherwise noted below.   The patient was advised to call back or seek an in-person evaluation if the symptoms worsen or if the condition fails to improve as anticipated.  Time:  I spent 15 minutes with the patient via telehealth technology discussing the above problems/concerns.    Bruce Daring, PA-C

## 2021-07-21 NOTE — Patient Instructions (Signed)
[name], thank you for joining Margaretann Loveless, PA-C for today's virtual visit.  While this provider is not your primary care provider (PCP), if your PCP is located in our provider database this encounter information will be shared with them immediately following your visit.  Consent: (Patient) [name] provided verbal consent for this virtual visit at the beginning of the encounter.  Current Medications:  Current Outpatient Medications:    amoxicillin-clavulanate (AUGMENTIN) 875-125 MG tablet, Take 1 tablet by mouth 2 (two) times daily., Disp: 14 tablet, Rfl: 0   cyclobenzaprine (FLEXERIL) 10 MG tablet, TAKE 1 TABLET BY MOUTH AS NEEDED (Patient not taking: Reported on 04/17/2020), Disp: 30 tablet, Rfl: 2   diltiazem (CARDIZEM) 30 MG tablet, TAKE 1 TABLET EVERY DAY AS NEEDED FOR RECTAL SPASM PAINS, Disp: 90 tablet, Rfl: 1   DUEXIS 800-26.6 MG TABS, Take 1 tablet by mouth 3 (three) times daily., Disp: 90 tablet, Rfl: 0   HYDROmorphone (DILAUDID) 2 MG tablet, Take by mouth every 4 (four) hours as needed for severe pain., Disp: , Rfl:    NUCYNTA 75 MG tablet, TAKE 1 TABLET BY MOUTH EVERY 6 TO 8 HOURS AS NEEDED (Patient not taking: Reported on 04/17/2020), Disp: , Rfl: 0   tiZANidine (ZANAFLEX) 4 MG tablet, Take 1 tablet (4 mg total) by mouth every 8 (eight) hours as needed for muscle spasms., Disp: 30 tablet, Rfl: 1   Vitamin D, Ergocalciferol, (DRISDOL) 1.25 MG (50000 UT) CAPS capsule, TAKE 1 CAPSULE BY MOUTH ONE TIME PER WEEK (Patient not taking: Reported on 04/17/2020), Disp: 7 capsule, Rfl: 0   Medications ordered in this encounter:  Meds ordered this encounter  Medications   amoxicillin-clavulanate (AUGMENTIN) 875-125 MG tablet    Sig: Take 1 tablet by mouth 2 (two) times daily.    Dispense:  14 tablet    Refill:  0    Order Specific Question:   Supervising Provider    Answer:   Hyacinth Meeker, BRIAN [[id number]]     *If you need refills on other medications prior to your  next appointment, please contact your pharmacy*  Follow-Up: Call back or seek an in-person evaluation if the symptoms worsen or if the condition fails to improve as anticipated.  Other Instructions Otitis Media, Adult Otitis media occurs when there is inflammation and fluid in the middle ear with signs and symptoms of an acute infection. The middle ear is a part of the ear that contains bones for hearing as well as air that helps send sounds to the brain. When infected fluid builds up in this space, it causes pressure and can lead to an ear infection. The eustachian tube connects the middle ear to the back of the nose (nasopharynx) and normally allows air into the middle ear. If the eustachian tube becomes blocked, fluid can build up and become infected. What are the causes? This condition is caused by a blockage in the eustachian tube. This can be caused by mucus or by swelling of the tube. Problems that can cause a blockage include: A cold or other upper respiratory infection. Allergies. An irritant, such as tobacco smoke. Enlarged adenoids. The adenoids are areas of soft tissue located high in the back of the throat, behind the nose and the roof of the mouth. They are part of the body's defense system (immune system). A mass in the nasopharynx. Damage to the ear caused by pressure changes (barotrauma). What increases the risk? You are more likely to develop this condition if  you: Smoke or are exposed to tobacco smoke. Have an opening in the roof of your mouth (cleft palate). Have gastroesophageal reflux. Have an immune system disorder. What are the signs or symptoms? Symptoms of this condition include: Ear pain. Fever. Decreased hearing. Tiredness (lethargy). Fluid leaking from the ear, if the eardrum is ruptured or has burst. Ringing in the ear. How is this diagnosed? This condition is diagnosed with a physical exam. During the exam, your health care provider will use an instrument  called an otoscope to look in your ear and check for redness, swelling, and fluid. He or she will also ask about your symptoms. Your health care provider may also order tests, such as: A pneumatic otoscopy. This is a test to check the movement of the eardrum. It is done by squeezing a small amount of air into the ear. A tympanogram. This is a test that shows how well the eardrum moves in response to air pressure in the ear canal. It provides a graph for your health care provider to review. How is this treated? This condition can go away on its own within 3-5 days. But if the condition is caused by a bacterial infection and does not go away on its own, or if it keeps coming back, your health care provider may: Prescribe antibiotic medicine to treat the infection. Prescribe or recommend medicines to control pain. Follow these instructions at home: Take over-the-counter and prescription medicines only as told by your health care provider. If you were prescribed an antibiotic medicine, take it as told by your health care provider. Do not stop taking the antibiotic even if you start to feel better. Keep all follow-up visits. This is important. Contact a health care provider if: You have bleeding from your nose. There is a lump on your neck. You are not feeling better in 5 days. You feel worse instead of better. Get help right away if: You have severe pain that is not controlled with medicine. You have swelling, redness, or pain around your ear. You have stiffness in your neck. A part of your face is not moving (paralyzed). The bone behind your ear (mastoid bone) is tender when you touch it. You develop a severe headache. Summary Otitis media is redness, soreness, and swelling of the middle ear, usually resulting in pain and decreased hearing. This condition can go away on its own within 3-5 days. If the problem does not go away in 3-5 days, your health care provider may give you medicines to  treat the infection. If you were prescribed an antibiotic medicine, take it as told by your health care provider. Follow all instructions that were given to you by your health care provider. This information is not intended to replace advice given to you by your health care provider. Make sure you discuss any questions you have with your health care provider. Document Revised: 08/25/2020 Document Reviewed: 08/25/2020 Elsevier Patient Education  2022 [employer]   Sinusitis, Adult Sinusitis is inflammation of your sinuses. Sinuses are hollow spaces in the bones around your face. Your sinuses are located: Around your eyes. In the middle of your forehead. Behind your nose. In your cheekbones. Mucus normally drains out of your sinuses. When your nasal tissues become inflamed or swollen, mucus can become trapped or blocked. This allows bacteria, viruses, and fungi to grow, which leads to infection. Most infections of the sinuses are caused by a virus. Sinusitis can develop quickly. It can last for up to 4 weeks (  acute) or for more than 12 weeks (chronic). Sinusitis often develops after a cold. What are the causes? This condition is caused by anything that creates swelling in the sinuses or stops mucus from draining. This includes: Allergies. Asthma. Infection from bacteria or viruses. Deformities or blockages in your nose or sinuses. Abnormal growths in the nose (nasal polyps). Pollutants, such as chemicals or irritants in the air. Infection from fungi (rare). What increases the risk? You are more likely to develop this condition if you: Have a weak body defense system (immune system). Do a lot of swimming or diving. Overuse nasal sprays. Smoke. What are the signs or symptoms? The main symptoms of this condition are pain and a feeling of pressure around the affected sinuses. Other symptoms include: Stuffy nose or congestion. Thick drainage from your nose. Swelling and warmth over the  affected sinuses. Headache. Upper toothache. A cough that may get worse at night. Extra mucus that collects in the throat or the back of the nose (postnasal drip). Decreased sense of smell and taste. Fatigue. A fever. Sore throat. Bad breath. How is this diagnosed? This condition is diagnosed based on: Your symptoms. Your medical history. A physical exam. Tests to find out if your condition is acute or chronic. This may include: Checking your nose for nasal polyps. Viewing your sinuses using a device that has a light (endoscope). Testing for allergies or bacteria. Imaging tests, such as an MRI or CT scan. In rare cases, a bone biopsy may be done to rule out more serious types of fungal sinus disease. How is this treated? Treatment for sinusitis depends on the cause and whether your condition is chronic or acute. If caused by a virus, your symptoms should go away on their own within 10 days. You may be given medicines to relieve symptoms. They include: Medicines that shrink swollen nasal passages (topical intranasal decongestants). Medicines that treat allergies (antihistamines). A spray that eases inflammation of the nostrils (topical intranasal corticosteroids). Rinses that help get rid of thick mucus in your nose (nasal saline washes). If caused by bacteria, your health care provider may recommend waiting to see if your symptoms improve. Most bacterial infections will get better without antibiotic medicine. You may be given antibiotics if you have: A severe infection. A weak immune system. If caused by narrow nasal passages or nasal polyps, you may need to have surgery. Follow these instructions at home: Medicines Take, use, or apply over-the-counter and prescription medicines only as told by your health care provider. These may include nasal sprays. If you were prescribed an antibiotic medicine, take it as told by your health care provider. Do not stop taking the antibiotic even  if you start to feel better. Hydrate and humidify  Drink enough fluid to keep your urine pale yellow. Staying hydrated will help to thin your mucus. Use a cool mist humidifier to keep the humidity level in your home above 50%. Inhale steam for 10-15 minutes, 3-4 times a day, or as told by your health care provider. You can do this in the bathroom while a hot shower is running. Limit your exposure to cool or dry air. Rest Rest as much as possible. Sleep with your head raised (elevated). Make sure you get enough sleep each night. General instructions  Apply a warm, moist washcloth to your face 3-4 times a day or as told by your health care provider. This will help with discomfort. Wash your hands often with soap and water to reduce your exposure  to germs. If soap and water are not available, use hand sanitizer. Do not smoke. Avoid being around people who are smoking (secondhand smoke). Keep all follow-up visits as told by your health care provider. This is important. Contact a health care provider if: You have a fever. Your symptoms get worse. Your symptoms do not improve within 10 days. Get help right away if: You have a severe headache. You have persistent vomiting. You have severe pain or swelling around your face or eyes. You have vision problems. You develop confusion. Your neck is stiff. You have trouble breathing. Summary Sinusitis is soreness and inflammation of your sinuses. Sinuses are hollow spaces in the bones around your face. This condition is caused by nasal tissues that become inflamed or swollen. The swelling traps or blocks the flow of mucus. This allows bacteria, viruses, and fungi to grow, which leads to infection. If you were prescribed an antibiotic medicine, take it as told by your health care provider. Do not stop taking the antibiotic even if you start to feel better. Keep all follow-up visits as told by your health care provider. This is important. This  information is not intended to replace advice given to you by your health care provider. Make sure you discuss any questions you have with your health care provider. Document Revised: 10/17/2017 Document Reviewed: 10/17/2017 Elsevier Patient Education  2022 [employer].    If you have been instructed to have an in-person evaluation today at a local Urgent Care facility, please use the link below. It will take you to a list of all of our available [facility], including address, phone number and hours of operation. Please do not delay care.  [facility]  If you or a family member do not have a primary care provider, use the link below to schedule a visit and establish care. When you choose a [facility] primary care physician or advanced practice provider, you gain a long-term partner in health. Find a Primary Care Provider  Learn more about [facility]'s in-office and virtual care options: [facility] - Get Care Now

## 2021-08-16 ENCOUNTER — Emergency Department (HOSPITAL_COMMUNITY)
Admission: EM | Admit: 2021-08-16 | Discharge: 2021-08-16 | Disposition: A | Discharge status: Home / Self Care | Payer: Medicare Other | Attending: Emergency Medicine | Admitting: Emergency Medicine

## 2021-08-16 ENCOUNTER — Other Ambulatory Visit: Payer: Self-pay

## 2021-08-16 ENCOUNTER — Emergency Department (HOSPITAL_COMMUNITY): Payer: Medicare Other

## 2021-08-16 ENCOUNTER — Encounter (HOSPITAL_COMMUNITY): Payer: Self-pay

## 2021-08-16 DIAGNOSIS — I10 Essential (primary) hypertension: Secondary | ICD-10-CM | POA: Diagnosis not present

## 2021-08-16 DIAGNOSIS — R0602 Shortness of breath: Secondary | ICD-10-CM | POA: Diagnosis not present

## 2021-08-16 DIAGNOSIS — R739 Hyperglycemia, unspecified: Secondary | ICD-10-CM | POA: Insufficient documentation

## 2021-08-16 DIAGNOSIS — F419 Anxiety disorder, unspecified: Secondary | ICD-10-CM | POA: Diagnosis not present

## 2021-08-16 DIAGNOSIS — R079 Chest pain, unspecified: Secondary | ICD-10-CM

## 2021-08-16 DIAGNOSIS — D696 Thrombocytopenia, unspecified: Secondary | ICD-10-CM | POA: Diagnosis not present

## 2021-08-16 DIAGNOSIS — Z79899 Other long term (current) drug therapy: Secondary | ICD-10-CM | POA: Diagnosis not present

## 2021-08-16 DIAGNOSIS — R072 Precordial pain: Secondary | ICD-10-CM | POA: Insufficient documentation

## 2021-08-16 LAB — CBC
HCT: 43.5 % (ref 39.0–52.0)
Hemoglobin: 14.9 g/dL (ref 13.0–17.0)
MCH: 30.3 pg (ref 26.0–34.0)
MCHC: 34.3 g/dL (ref 30.0–36.0)
MCV: 88.4 fL (ref 80.0–100.0)
Platelets: 120 10*3/uL — ABNORMAL LOW (ref 150–400)
RBC: 4.92 MIL/uL (ref 4.22–5.81)
RDW: 12.2 % (ref 11.5–15.5)
WBC: 5.5 10*3/uL (ref 4.0–10.5)
nRBC: 0 % (ref 0.0–0.2)

## 2021-08-16 LAB — BASIC METABOLIC PANEL
Anion gap: 7 (ref 5–15)
BUN: 17 mg/dL (ref 6–20)
CO2: 27 mmol/L (ref 22–32)
Calcium: 9.1 mg/dL (ref 8.9–10.3)
Chloride: 103 mmol/L (ref 98–111)
Creatinine, Ser: 1.08 mg/dL (ref 0.61–1.24)
GFR, Estimated: >60 mL/min (ref >60)
Glucose, Bld: 111 mg/dL — ABNORMAL HIGH (ref 70–99)
Potassium: 4.4 mmol/L (ref 3.5–5.1)
Sodium: 137 mmol/L (ref 135–145)

## 2021-08-16 LAB — TROPONIN I (HIGH SENSITIVITY)
Troponin I (High Sensitivity): 3 ng/L (ref <18)
Troponin I (High Sensitivity): 3 ng/L (ref <18)

## 2021-08-16 MED ORDER — HYDROMORPHONE HCL 1 MG/ML IJ SOLN
1.0000 mg | Freq: Once | INTRAMUSCULAR | Status: AC
  Administered 2021-08-16: 1 mg via INTRAVENOUS
  Filled 2021-08-16: qty 1

## 2021-08-16 MED ORDER — KETOROLAC TROMETHAMINE 15 MG/ML IJ SOLN
15.0000 mg | Freq: Once | INTRAMUSCULAR | Status: AC
  Administered 2021-08-16: 15 mg via INTRAVENOUS
  Filled 2021-08-16: qty 1

## 2021-08-16 NOTE — ED Notes (Signed)
Patient transported to X-ray 

## 2021-08-16 NOTE — ED Provider Notes (Signed)
?[facility]-EMERGENCY DEPT ?Provider Note ? ? ?CSN: [id number] ?Arrival date & time: 08/16/21  0820 ? ?  ? ?History ? ?Chief Complaint  ?Patient presents with  ? Chest Pain  ? ? ?Bruce Morales is a 46 y.o. male with past medical history significant for previous lumbar laminectomy with chronic back pain, sciatica issues, history of tobacco use, 30+ pack years of smoking, recently quit, history of depression, hypertension, hyperlipidemia who presents with concern for sharp midsternal chest pain intermittent over the last 1 and half weeks.  He reports that it lasts for around 5 to 10 minutes when it occurs, associated with shortness of breath.  He denies any association with exertion, nausea, vomiting, radiation, diaphoresis.  No previous coronary artery disease or ACS, patient has not seen a cardiologist before.  He does not have for screw family history of ACS or CAD.  No recent travel, history of blood clots, hormone use, hemoptysis.  He does endorse some shortness of breath, worsening pain with inspiration associated with the chest pain.  He reports he is chronically on Dilaudid 4 mg tablets for his chronic pain issues. ? ? ?Chest Pain ?Associated symptoms: shortness of breath   ? ?  ? ?Home Medications ?Prior to Admission medications   ?Medication Sig Start Date End Date Taking? Authorizing Provider  ?amoxicillin-clavulanate (AUGMENTIN) 875-125 MG tablet Take 1 tablet by mouth 2 (two) times daily. 07/21/21   Margaretann LovelessBurnette, Jennifer M, PA-C  ?cyclobenzaprine (FLEXERIL) 10 MG tablet TAKE 1 TABLET BY MOUTH AS NEEDED ?Patient not taking: Reported on 04/17/2020 08/14/19   Georgina QuintSagardia, Miguel Jose, MD  ?diltiazem (CARDIZEM) 30 MG tablet TAKE 1 TABLET EVERY DAY AS NEEDED FOR RECTAL SPASM PAINS 07/09/20   Georgina QuintSagardia, Miguel Jose, MD  ?DUEXIS 800-26.6 MG TABS Take 1 tablet by mouth 3 (three) times daily. 08/09/18   Georgina QuintSagardia, Miguel Jose, MD  ?HYDROmorphone (DILAUDID) 2 MG tablet Take by mouth every 4 (four) hours  as needed for severe pain.    [provider]  ?NUCYNTA 75 MG tablet TAKE 1 TABLET BY MOUTH EVERY 6 TO 8 HOURS AS NEEDED ?Patient not taking: Reported on 04/17/2020 02/22/18   [provider]  ?tiZANidine (ZANAFLEX) 4 MG tablet Take 1 tablet (4 mg total) by mouth every 8 (eight) hours as needed for muscle spasms. 06/18/19   Georgina QuintSagardia, Miguel Jose, MD  ?Vitamin D, Ergocalciferol, (DRISDOL) 1.25 MG (50000 UT) CAPS capsule TAKE 1 CAPSULE BY MOUTH ONE TIME PER WEEK ?Patient not taking: Reported on 04/17/2020 04/04/19   Georgina QuintSagardia, Miguel Jose, MD  ?   ? ?Allergies    ?Other, Hydrocodone, Oxycodone, and Percocet [oxycodone-acetaminophen]   ? ?Review of Systems   ?Review of Systems  ?Respiratory:  Positive for shortness of breath.   ?Cardiovascular:  Positive for chest pain.  ?All other systems reviewed and are negative. ? ?Physical Exam ?Updated Vital Signs ?BP (!) 142/95   Pulse 65   Temp 97.7 ?F (36.5 ?C) (Oral)   Resp 16   Ht 5\' 8"  (1.727 m)   Wt 117.9 kg   SpO2 98%   BMI 39.53 kg/m?  ?Physical Exam ?Vitals and nursing note reviewed.  ?Constitutional:   ?   General: He is not in acute distress. ?   Appearance: Normal appearance.  ?HENT:  ?   Head: Normocephalic and atraumatic.  ?Eyes:  ?   General:     ?   Right eye: No discharge.     ?   Left eye: No  discharge.  ?Cardiovascular:  ?   Rate and Rhythm: Normal rate and regular rhythm.  ?   Heart sounds: No murmur heard. ?  No friction rub. No gallop.  ?Pulmonary:  ?   Effort: Pulmonary effort is normal.  ?   Breath sounds: Normal breath sounds.  ?Abdominal:  ?   General: Bowel sounds are normal.  ?   Palpations: Abdomen is soft.  ?Skin: ?   General: Skin is warm and dry.  ?   Capillary Refill: Capillary refill takes less than 2 seconds.  ?Neurological:  ?   Mental Status: He is alert and oriented to person, place, and time.  ?   Comments: Somewhat anxious  ?Psychiatric:     ?   Mood and Affect: Mood normal.     ?   Behavior: Behavior normal.  ? ? ?ED  Results / Procedures / Treatments   ?Labs ?(all labs ordered are listed, but only abnormal results are displayed) ?Labs Reviewed  ?BASIC METABOLIC PANEL - Abnormal; Notable for the following components:  ?    Result Value  ? Glucose, Bld 111 (*)   ? All other components within normal limits  ?CBC - Abnormal; Notable for the following components:  ? Platelets 120 (*)   ? All other components within normal limits  ?TROPONIN I (HIGH SENSITIVITY)  ?TROPONIN I (HIGH SENSITIVITY)  ? ? ?EKG ?EKG Interpretation ? ?Date/Time:  Sunday August 16 2021 08:25:53 EDT ?Ventricular Rate:  61 ?PR Interval:  246 ?QRS Duration: 98 ?QT Interval:  389 ?QTC Calculation: 392 ?R Axis:   40 ?Text Interpretation: Sinus rhythm Prolonged PR interval Low voltage, precordial leads since last tracing no significant change Confirmed by Mancel Bale [phone]) on 08/16/2021 12:31:58 PM ? ?Radiology ?DG Chest 2 View ? ?Result Date: 08/16/2021 ?CLINICAL DATA:  Chest pain EXAM: CHEST - 2 VIEW COMPARISON:  Chest x-ray 02/06/2020 FINDINGS: Heart size and mediastinal contours are within normal limits. No suspicious pulmonary opacities identified. No pleural effusion or pneumothorax visualized. No acute osseous abnormality appreciated. IMPRESSION: No acute intrathoracic process identified. Electronically Signed   By: Jannifer Hick M.D.   On: 08/16/2021 09:37   ? ?Procedures ?Procedures  ? ? ?Medications Ordered in ED ?Medications  ?ketorolac (TORADOL) 15 MG/ML injection 15 mg (15 mg Intravenous Given 08/16/21 0947)  ?HYDROmorphone (DILAUDID) injection 1 mg (1 mg Intravenous Given 08/16/21 0948)  ? ? ?ED Course/ Medical Decision Making/ A&P ?Clinical Course as of 08/16/21 1428  ?Wynelle Link Aug 16, 2021  ?0935 PERC negative [CP]  ?  ?Clinical Course User Index ?[CP] Javarie Crisp H, PA-C  ? ?                        ?Medical Decision Making ?Amount and/or Complexity of Data Reviewed ?Labs: ordered. ?Radiology: ordered. ? ?Risk ?Prescription drug  management. ? ? ?This patient presents to the ED for concern of burning chest pain, shortness of breath, this involves an extensive number of treatment options, and is a complaint that carries with it a high risk of complications and morbidity. The emergent differential diagnosis prior to evaluation includes, but is not limited to,  ACS, aortic dissection, aneurysm, pulmonary embolism, pneumonia, acid reflux, Boerhaave's, Mallory-Weiss, pericarditis, endocarditis, cardiac tamponade, anxiety versus other.  ? ?This is not an exhaustive differential.  ? ?Past Medical History / Co-morbidities / Social History: ?History of tobacco use, 30-pack-year smoking, prediabetes, obesity, no personal history of hypertension, hyperlipidemia, family history or personal history  of ACS, personal history of stroke ? ?Additional history: ?External records from outside source obtained and reviewed including outpatient neurology, family medicine visits. ? ?Physical Exam: ?Physical exam performed. The pertinent findings include: Somewhat anxious appearing male with no significant tenderness to palpation of the chest, upper abdomen. ? ?Lab Tests: ?I ordered, and personally interpreted labs.  The pertinent results include: BMP with very mild hyperglycemia glucose 111.  CBC with mild thrombocytopenia platelets 120.  Troponin is negative x2 with no delta. ?  ?Imaging Studies: ?I ordered imaging studies including film radiographs of the chest. I independently visualized and interpreted imaging which showed no intrathoracic abnormality. I agree with the radiologist interpretation. ?  ?Cardiac Monitoring:  ?The patient was maintained on a cardiac monitor.  My attending physician Dr. Effie Shy viewed and interpreted the cardiac monitored which showed an underlying rhythm of: Normal sinus rhythm with prolonged PR interval.  This is a new change since previous EKG.  Discussed with patient that he should get in contact with his primary care doctor with a  cardiologist that he is following up with discuss prolonged PR interval as it can lead to other cardiac arrhythmias.  Discussed no further treatment needed at this time unless the heart rhythm progresses. ?  ?Medications:

## 2021-08-16 NOTE — Discharge Instructions (Addendum)
The findings that we discussed that I recommend that you follow-up with your PCP or with cardiology is what is known as a prolonged PR interval, as we discussed this can lead to arrhythmias in the future and you should have a provider keep an eye on it.  In regards to your elevated blood pressure in the emergency department today, you should follow-up with your primary care doctor to see if they want to put you on a blood pressure medication if it remains elevated when you are not in an emergency department setting. ? ?Finally I placed contact information for cardiology and an ambulatory referral, for you to follow-up for further evaluation of your chest pain, stress test, etc.  Please return to the emergency department if your chest pain worsens, begin coughing up blood, having severe or worsening headache, vision changes. ?

## 2021-08-16 NOTE — ED Triage Notes (Signed)
Patient reports intermittent sharp chest x 1 1/2 weeks. Patient reports SOB when chest pain occurs. ?

## 2021-08-18 NOTE — Progress Notes (Signed)
?Cardiology Office Note:   ? ?Date:  08/19/2021  ? ?ID:  Bruce CityDavid L Hopke, DOB Aug 11, 1975, MRN [id number] ? ?PCP:  Georgina QuintSagardia, Miguel Jose, MD  ?Cardiologist:  None  ?Electrophysiologist:  None  ? ?Referring MD: Georgina QuintSagardia, Miguel Jose, *  ? ?Chief Complaint  ?Patient presents with  ? Chest Pain  ? ? ?History of Present Illness:   ? ?Bruce Morales is a 46 y.o. male with a hx of hyperlipidemia, tobacco use, hypertension who is referred by Dr. Alvy BimlerSagardia for evaluation of chest pain.  Seen in the ED on 08/16/2021 with chest pain.  Troponins negative x2.  He reports that he was having intermittent chest pain for one week prior to ED visit.  Reports pain across entire chest, starts off feeling warm and then sharp pain.  Occurred at rest.  He does not exercise due to back pain.  Pain will last for about 5 minutes or so and resolve.  He then woke up on the morning of 3/19 with chest pain.  Pain lasted for 15 to 20 minutes and prompted him to go to the ED.  He reports since that time he has had mild chest soreness.  Also reports dyspnea with minimal exertion.  Reports occasional lightheadedness but denies any syncope.  Reports mild lower extremity edema.  Reports he gets pain in the back of his legs when he walks.  Denies any palpitations.  He smoked 1.5 to 2 packs/day, smoked for almost 30 years but quit in 2016.  Family history includes paternal grandfather had heart failure and maternal grandfather had CABG. ? ?Calcium score on 12/19/2020 was 6 (80th percentile) ? ? ?Past Medical History:  ?Diagnosis Date  ? Anginal pain (HCC) 04/15/2014  ? Arthritis   ? "wrists, knees, lower back" (04/16/2014)  ? Carpal tunnel syndrome   ? Chronic bronchitis (HCC)   ? "probably get it q yr"  ? Chronic lower back pain   ? Chronic lower back pain   ? Constipation   ? Gall stones   ? GERD (gastroesophageal reflux disease)   ? GERD (gastroesophageal reflux disease)   ? HLD (hyperlipidemia)   ? Hyperlipidemia   ? Phreesia 04/14/2020  ? Joint  pain   ? Meningitis   ? Plantar fasciitis   ? Right knee pain   ? Sciatica   ? ? ?Past Surgical History:  ?Procedure Laterality Date  ? CARPAL TUNNEL RELEASE    ? 02/2018 Right arm  ? CHOLECYSTECTOMY  2011  ? ESOPHAGOGASTRODUODENOSCOPY N/A 02/27/2013  ? Procedure: ESOPHAGOGASTRODUODENOSCOPY (EGD);  Surgeon: Florencia Reasonsobert V Buccini, MD;  [facility];  Service: Endoscopy;  Laterality: N/A;  ? KNEE ARTHROSCOPY Right 2013 X 2  ? KNEE ARTHROSCOPY Right 02/22/2018  ? LAMINECTOMY AND MICRODISCECTOMY SPINE    ? LEFT HEART CATHETERIZATION WITH CORONARY ANGIOGRAM N/A 04/17/2014  ? Procedure: LEFT HEART CATHETERIZATION WITH CORONARY ANGIOGRAM;  Surgeon: Ricki RodriguezAjay S Kadakia, MD;  [facility];  Service: Cardiovascular;  Laterality: N/A;  ? RADIOLOGY WITH ANESTHESIA N/A 06/11/2015  ? Procedure: MRI LUMBAR WITH AND WITHOUT CONTRAST;  Surgeon: Medication Radiologist, MD;  Location: [facility] OR;  Service: Radiology;  Laterality: N/A;  ? SPINE SURGERY N/A   ? Phreesia 04/14/2020  ? ? ?Current Medications: ?Current Meds  ?Medication Sig  ? ascorbic acid (VITAMIN C) 1000 MG tablet Take by mouth.  ? diltiazem (CARDIZEM) 30 MG tablet TAKE 1 TABLET EVERY DAY AS NEEDED FOR RECTAL SPASM PAINS (Patient taking differently: Take 30 mg by mouth  daily. TAKE 1 TABLET EVERY DAY)  ? fluticasone (FLONASE) 50 MCG/ACT nasal spray as needed.  ? HYDROmorphone (DILAUDID) 4 MG tablet Take 4 mg by mouth every 6 (six) hours as needed.  ? Magnesium 100 MG CAPS Take by mouth.  ? metoprolol tartrate (LOPRESSOR) 50 MG tablet Take 1 tablet (50 mg) TWO hours prior to CT scan  ? rosuvastatin (CRESTOR) 10 MG tablet Take 1 tablet (10 mg total) by mouth daily.  ? tiZANidine (ZANAFLEX) 4 MG tablet Take 1 tablet (4 mg total) by mouth every 8 (eight) hours as needed for muscle spasms.  ? Vitamin D, Ergocalciferol, (DRISDOL) 1.25 MG (50000 UT) CAPS capsule TAKE 1 CAPSULE BY MOUTH ONE TIME PER WEEK  ? [DISCONTINUED] HYDROmorphone (DILAUDID) 2 MG tablet Take by mouth  every 4 (four) hours as needed for severe pain.  ?  ? ?Allergies:   Other, Hydrocodone, Oxycodone, and Percocet [oxycodone-acetaminophen]  ? ?Social History  ? ?Socioeconomic History  ? Marital status: Married  ?  Spouse name: Janelle Floor   ? Number of children: 2  ? Years of education: Not on file  ? Highest education level: Not on file  ?Occupational History  ? Occupation: Disabled  ?Tobacco Use  ? Smoking status: Former  ?  Packs/day: 0.50  ?  Years: 27.00  ?  Pack years: 13.50  ?  Types: Cigarettes  ?  Quit date: 03/11/2015  ?  Years since quitting: 6.4  ? Smokeless tobacco: Former  ?Vaping Use  ? Vaping Use: Never used  ?Substance and Sexual Activity  ? Alcohol use: Yes  ? Drug use: No  ?  Comment: 04/16/2014 "in my teens"  ? Sexual activity: Yes  ?Other Topics Concern  ? Not on file  ?Social History Narrative  ? Drinks 2 cups of coffee in the morning.  ? ?Social Determinants of Health  ? ?Financial Resource Strain: Not on file  ?Food Insecurity: Not on file  ?Transportation Needs: Not on file  ?Physical Activity: Not on file  ?Stress: Not on file  ?Social Connections: Not on file  ?  ? ?Family History: ?The patient's family history includes Diabetes in his father and paternal grandmother; Fibromyalgia in his sister; Heart failure in his maternal grandfather; Hyperlipidemia in his father; Multiple sclerosis in his mother; Obesity in his father and mother; Schizophrenia in his brother; Sleep apnea in his father; Stroke in his maternal grandmother. ? ?ROS:   ?Please see the history of present illness.    ? All other systems reviewed and are negative. ? ?EKGs/Labs/Other Studies Reviewed:   ? ?The following studies were reviewed today: ? ? ?EKG:   ?08/16/2021: Normal sinus rhythm, first-degree AV block, low voltage, rate 61 ? ? ?Recent Labs: ?08/16/2021: BUN 17; Creatinine, Ser 1.08; Hemoglobin 14.9; Platelets 120; Potassium 4.4; Sodium 137  ?Recent Lipid Panel ?   ?Component Value Date/Time  ? CHOL 278 (H) 04/17/2020 1042   ? TRIG 99 04/17/2020 1042  ? HDL 37 (L) 04/17/2020 1042  ? CHOLHDL 7.5 (H) 04/17/2020 1042  ? CHOLHDL 11.7 (H) 03/12/2015 1633  ? VLDL 75 (H) 03/12/2015 1633  ? LDLCALC 224 (H) 04/17/2020 1042  ? ? ?Physical Exam:   ? ?VS:  BP 112/72   Pulse 75   Ht 5\' 8"  (1.727 m)   Wt 265 lb 3.2 oz (120.3 kg)   SpO2 99%   BMI 40.32 kg/m?    ? ?Wt Readings from Last 3 Encounters:  ?08/19/21 265 lb 3.2 oz (120.3 kg)  ?  08/16/21 260 lb (117.9 kg)  ?04/17/20 242 lb 12.8 oz (110.1 kg)  ?  ? ?GEN:  Well nourished, well developed in no acute distress ?HEENT: Normal ?NECK: No JVD; No carotid bruits ?LYMPHATICS: No lymphadenopathy ?CARDIAC: RRR, no murmurs, rubs, gallops ?RESPIRATORY:  Clear to auscultation without rales, wheezing or rhonchi  ?ABDOMEN: Soft, non-tender, non-distended ?MUSCULOSKELETAL:  No edema; No deformity  ?SKIN: Warm and dry ?NEUROLOGIC:  Alert and oriented x 3 ?PSYCHIATRIC:  Normal affect  ? ?ASSESSMENT:   ? ?1. Chest pain of uncertain etiology   ?2. Pain in both lower extremities   ?3. Hyperlipidemia, unspecified hyperlipidemia type   ? ?PLAN:   ? ?Chest pain: Seen in the ED on 08/16/2021 with chest pain.  Troponins negative x2.  Calcium score on 12/19/2020 was 6 (80th percentile).  Description of chest pain is atypical.  Given risk factors (tobacco use, hyperlipidemia) and known CAD on calcium score, recommend evaluation for obstructive CAD with coronary CTA.  Will give lopressor 50 mg prior to study.  Check echocardiogram to rule out structural heart disease ? ?Hyperlipidemia: LDL 170 on 12/15/2020.  Calcium score on 12/19/2020 was 6 (80th percentile).  Will start rosuvastatin 10 mg daily ? ?Leg pain: Reports pain in back and legs with walking, will check ABIs ? ?RTC in 4 months ? ? ?Medication Adjustments/Labs and Tests Ordered: ?Current medicines are reviewed at length with the patient today.  Concerns regarding medicines are outlined above.  ?Orders Placed This Encounter  ?Procedures  ? CT CORONARY MORPH W/CTA  COR W/SCORE W/CA W/CM &/OR WO/CM  ? ECHOCARDIOGRAM COMPLETE  ? VAS [place] LOWER EXTREMITY ARTERIAL DUPLEX  ? VAS [place] ABI WITH/WO TBI  ? ?Meds ordered this encounter  ?Medications  ? rosuvastatin (CRESTOR) 10 MG ta

## 2021-08-19 ENCOUNTER — Other Ambulatory Visit: Payer: Self-pay

## 2021-08-19 ENCOUNTER — Ambulatory Visit: Payer: Medicare Other | Admitting: Cardiology

## 2021-08-19 ENCOUNTER — Encounter: Payer: Self-pay | Admitting: Cardiology

## 2021-08-19 VITALS — BP 112/72 | HR 75 | Ht 68.0 in | Wt 265.2 lb

## 2021-08-19 DIAGNOSIS — E785 Hyperlipidemia, unspecified: Secondary | ICD-10-CM

## 2021-08-19 DIAGNOSIS — M79604 Pain in right leg: Secondary | ICD-10-CM

## 2021-08-19 DIAGNOSIS — M79605 Pain in left leg: Secondary | ICD-10-CM | POA: Diagnosis not present

## 2021-08-19 DIAGNOSIS — R079 Chest pain, unspecified: Secondary | ICD-10-CM

## 2021-08-19 MED ORDER — METOPROLOL TARTRATE 50 MG PO TABS: ORAL_TABLET | ORAL | 0 refills | Status: DC

## 2021-08-19 MED ORDER — ROSUVASTATIN CALCIUM 10 MG PO TABS: 10.0000 mg | ORAL_TABLET | Freq: Every day | ORAL | 3 refills | Status: DC

## 2021-08-19 NOTE — Patient Instructions (Signed)
Medication Instructions:  ?START rosuvastatin (Crestor) 10 mg daily ? ?*If you need a refill on your cardiac medications before your next appointment, please call your pharmacy* ? ?Testing/Procedures: ?Coronary CTA-see instructions below ? ?Your physician has requested that you have an echocardiogram. Echocardiography is a painless test that uses sound waves to create images of your heart. It provides your doctor with information about the size and shape of your heart and how well your heart?s chambers and valves are working. This procedure takes approximately one hour. There are no restrictions for this procedure. ?This will be done at our [facility] location:  [address] ? ?Your physician has requested that you have an ankle brachial index (ABI). During this test an ultrasound and blood pressure cuff are used to evaluate the arteries that supply the arms and legs with blood. Allow thirty minutes for this exam. There are no restrictions or special instructions. ? ?Follow-Up: ?At [facility], you and your health needs are our priority.  As part of our continuing mission to provide you with exceptional heart care, we have created designated Provider Care Teams.  These Care Teams include your primary Cardiologist (physician) and Advanced Practice Providers (APPs -  Physician Assistants and Nurse Practitioners) who all work together to provide you with the care you need, when you need it. ? ?We recommend signing up for the patient portal called "MyChart".  Sign up information is provided on this After Visit Summary.  MyChart is used to connect with patients for Virtual Visits (Telemedicine).  Patients are able to view lab/test results, encounter notes, upcoming appointments, etc.  Non-urgent messages can be sent to your provider as well.   ?To learn more about what you can do with MyChart, go to [email or web address].   ? ?Your next appointment:   ?4-5 month(s) ? ?The format for your next  appointment:   ?In Person ? ?Provider:   ?Dr. Gardiner Rhyme ? ? ?Other Instructions ? ? ?Your cardiac CT will be scheduled at one of the below locations:  ? ?[facility] ?[address] ?[place], [place] [address] ?[phone] ? ?If scheduled at [facility], please arrive at the [facility] and Children's Entrance (Entrance C2) of [facility] 30 minutes prior to test start time. ?You can use the FREE valet parking offered at entrance C (encouraged to control the heart rate for the test)  ?Proceed to the [facility] Radiology Department (first floor) to check-in and test prep. ? ?All radiology patients and guests should use entrance C2 at [facility], accessed from [facility], even though the hospital's physical address listed is [address] ? ? ? ?Please follow these instructions carefully (unless otherwise directed): ? ?Hold all erectile dysfunction medications at least 3 days (72 hrs) prior to test. ? ?On the Night Before the Test: ?Be sure to Drink plenty of water. ?Do not consume any caffeinated/decaffeinated beverages or chocolate 12 hours prior to your test. ?Do not take any antihistamines 12 hours prior to your test. ? ?On the Day of the Test: ?Drink plenty of water until 1 hour prior to the test. ?Do not eat any food 4 hours prior to the test. ?You may take your regular medications prior to the test.  ?Take metoprolol (Lopressor) two hours prior to test. ? ?After the Test: ?Drink plenty of water. ?After receiving IV contrast, you may experience a mild flushed feeling. This is normal. ?On occasion, you may experience a mild  rash up to 24 hours after the test. This is not dangerous. If this occurs, you can take Benadryl 25 mg and increase your fluid intake. ?If you experience trouble breathing, this can be serious. If it is severe call 911 IMMEDIATELY. If it is mild, please call our office. ?If you take any of these medications: Glipizide/Metformin,  Avandament, Glucavance, please do not take 48 hours after completing test unless otherwise instructed. ? ?We will call to schedule your test 2-4 weeks out understanding that some insurance companies will need an authorization prior to the service being performed.  ? ?For non-scheduling related questions, please contact the cardiac imaging nurse navigator should you have any questions/concerns: ?Marchia Bond, Cardiac Imaging Nurse Navigator ?Gordy Clement, Cardiac Imaging Nurse Navigator ?[facility] ?Direct Office Dial: [phone]  ? ?For scheduling needs, including cancellations and rescheduling, please call [place], [phone]. ? ? ?

## 2021-08-26 ENCOUNTER — Ambulatory Visit (INDEPENDENT_AMBULATORY_CARE_PROVIDER_SITE_OTHER): Payer: Medicare Other

## 2021-08-26 DIAGNOSIS — R079 Chest pain, unspecified: Secondary | ICD-10-CM

## 2021-08-26 LAB — ECHOCARDIOGRAM COMPLETE
AR max vel: 2.56 cm2
AV Area VTI: 2.43 cm2
AV Area mean vel: 2.4 cm2
AV Mean grad: 4 mmHg
AV Peak grad: 8.4 mmHg
Ao pk vel: 1.45 m/s
S' Lateral: 2.79 cm

## 2021-08-26 MED ORDER — PERFLUTREN LIPID MICROSPHERE
1.0000 mL | INTRAVENOUS | Status: AC | PRN
  Administered 2021-08-26: 2 mL via INTRAVENOUS

## 2021-09-09 ENCOUNTER — Other Ambulatory Visit: Payer: Self-pay | Admitting: Cardiology

## 2021-09-09 DIAGNOSIS — M79605 Pain in left leg: Secondary | ICD-10-CM

## 2021-09-09 DIAGNOSIS — I739 Peripheral vascular disease, unspecified: Secondary | ICD-10-CM

## 2021-09-11 ENCOUNTER — Telehealth (HOSPITAL_COMMUNITY): Payer: Self-pay | Admitting: *Deleted

## 2021-09-11 NOTE — Telephone Encounter (Signed)
Attempted to call patient regarding upcoming cardiac CT appointment. °Left message on voicemail with name and callback number ° °Obediah Welles RN Navigator Cardiac Imaging °[facility] °[phone] Office °[phone] Cell ° °

## 2021-09-14 ENCOUNTER — Telehealth (HOSPITAL_COMMUNITY): Payer: Self-pay | Admitting: *Deleted

## 2021-09-14 NOTE — Telephone Encounter (Signed)
Reaching out to patient to offer assistance regarding upcoming cardiac imaging study; pt verbalizes understanding of appt date/time, parking situation and where to check in, pre-test NPO status and medications ordered, and verified current allergies; name and call back number provided for further questions should they arise ? ?Larey Brick RN Navigator Cardiac Imaging ?[facility] ?[phone] office ?[phone] cell ? ?Patient to take 50mg  metoprolol two hours prior to his cardiac CT scan.  He is aware to arrive at 8:30am. ?

## 2021-09-15 ENCOUNTER — Ambulatory Visit (HOSPITAL_COMMUNITY)
Admission: RE | Admit: 2021-09-15 | Discharge: 2021-09-15 | Disposition: A | Discharge status: Home / Self Care | Payer: Medicare Other | Source: Ambulatory Visit | Attending: Cardiology | Admitting: Cardiology

## 2021-09-15 ENCOUNTER — Encounter (HOSPITAL_COMMUNITY): Payer: Self-pay

## 2021-09-15 DIAGNOSIS — R079 Chest pain, unspecified: Secondary | ICD-10-CM | POA: Insufficient documentation

## 2021-09-15 DIAGNOSIS — I2510 Atherosclerotic heart disease of native coronary artery without angina pectoris: Secondary | ICD-10-CM | POA: Diagnosis not present

## 2021-09-15 MED ORDER — NITROGLYCERIN 0.4 MG SL SUBL
0.8000 mg | SUBLINGUAL_TABLET | Freq: Once | SUBLINGUAL | Status: AC
Start: 2021-09-15 — End: 2021-09-15
  Administered 2021-09-15: 0.8 mg via SUBLINGUAL

## 2021-09-15 MED ORDER — IOHEXOL 350 MG/ML SOLN
100.0000 mL | Freq: Once | INTRAVENOUS | Status: AC | PRN
  Administered 2021-09-15: 100 mL via INTRAVENOUS

## 2021-09-15 MED ORDER — NITROGLYCERIN 0.4 MG SL SUBL
SUBLINGUAL_TABLET | SUBLINGUAL | Status: AC
  Filled 2021-09-15: qty 2

## 2021-09-16 ENCOUNTER — Other Ambulatory Visit: Payer: Self-pay | Admitting: *Deleted

## 2021-09-16 ENCOUNTER — Ambulatory Visit (HOSPITAL_COMMUNITY)
Admission: RE | Admit: 2021-09-16 | Discharge: 2021-09-16 | Disposition: A | Discharge status: Home / Self Care | Payer: Medicare Other | Source: Ambulatory Visit | Attending: Cardiovascular Disease | Admitting: Cardiovascular Disease

## 2021-09-16 DIAGNOSIS — I739 Peripheral vascular disease, unspecified: Secondary | ICD-10-CM | POA: Diagnosis present

## 2021-09-16 DIAGNOSIS — M79604 Pain in right leg: Secondary | ICD-10-CM | POA: Diagnosis present

## 2021-09-16 DIAGNOSIS — E785 Hyperlipidemia, unspecified: Secondary | ICD-10-CM

## 2021-09-16 DIAGNOSIS — M79605 Pain in left leg: Secondary | ICD-10-CM | POA: Diagnosis present

## 2021-09-30 ENCOUNTER — Encounter: Payer: Self-pay | Admitting: Emergency Medicine

## 2021-09-30 ENCOUNTER — Ambulatory Visit (INDEPENDENT_AMBULATORY_CARE_PROVIDER_SITE_OTHER): Payer: Medicare Other | Admitting: Emergency Medicine

## 2021-09-30 VITALS — BP 126/84 | HR 63 | Temp 98.2°F | Ht 68.0 in | Wt 266.5 lb

## 2021-09-30 DIAGNOSIS — R7303 Prediabetes: Secondary | ICD-10-CM | POA: Diagnosis not present

## 2021-09-30 DIAGNOSIS — G8929 Other chronic pain: Secondary | ICD-10-CM

## 2021-09-30 DIAGNOSIS — Z7689 Persons encountering health services in other specified circumstances: Secondary | ICD-10-CM | POA: Diagnosis not present

## 2021-09-30 DIAGNOSIS — K760 Fatty (change of) liver, not elsewhere classified: Secondary | ICD-10-CM | POA: Diagnosis not present

## 2021-09-30 DIAGNOSIS — M545 Low back pain, unspecified: Secondary | ICD-10-CM | POA: Diagnosis not present

## 2021-09-30 LAB — COMPREHENSIVE METABOLIC PANEL
ALT: 57 U/L — ABNORMAL HIGH (ref 0–53)
AST: 30 U/L (ref 0–37)
Albumin: 5 g/dL (ref 3.5–5.2)
Alkaline Phosphatase: 55 U/L (ref 39–117)
BUN: 21 mg/dL (ref 6–23)
CO2: 29 mEq/L (ref 19–32)
Calcium: 9.6 mg/dL (ref 8.4–10.5)
Chloride: 103 mEq/L (ref 96–112)
Creatinine, Ser: 1.15 mg/dL (ref 0.40–1.50)
GFR: 77.93 mL/min (ref >60.00)
Glucose, Bld: 99 mg/dL (ref 70–99)
Potassium: 4.8 mEq/L (ref 3.5–5.1)
Sodium: 138 mEq/L (ref 135–145)
Total Bilirubin: 1.1 mg/dL (ref 0.2–1.2)
Total Protein: 7.4 g/dL (ref 6.0–8.3)

## 2021-09-30 LAB — LIPID PANEL
Cholesterol: 220 mg/dL — ABNORMAL HIGH (ref 0–200)
HDL: 34.3 mg/dL — ABNORMAL LOW (ref >39.00)
LDL Cholesterol: 158 mg/dL — ABNORMAL HIGH (ref 0–99)
NonHDL: 185.4
Total CHOL/HDL Ratio: 6
Triglycerides: 136 mg/dL (ref 0.0–149.0)
VLDL: 27.2 mg/dL (ref 0.0–40.0)

## 2021-09-30 LAB — HEMOGLOBIN A1C: Hgb A1c MFr Bld: 5.8 % (ref 4.6–6.5)

## 2021-09-30 NOTE — Progress Notes (Signed)
[name] ?45 y.o. ? ? ?Chief Complaint  ?Patient presents with  ? New Patient (Initial Visit)  ? Back Pain  ?  Lower back pain , progressively getting worse   ? ? ?HISTORY OF PRESENT ILLNESS: ?This is a 46 y.o. male here to reestablish care with me. ?Last office visit in November 2020.A1A ?Has history of chronic back pain.  On chronic pain management. ?Recently seen by cardiologist and neurologist.  No significant findings. ?Found to be prediabetic and having a fatty liver.  Needs follow-up on this. ?No other complaints or medical concerns today. ?Most recent neurology office visit assessment and plan as follows: ?Assessment ?1. Cervical disc disease  ? ?2. Myelopathy (HCC)  ? ?3. Muscle spasm  ? ?4. Degeneration of lumbar intervertebral disc  ? ?5. Word finding difficulty  ? ? ?Plan ?After review, discussion and assessment, I told him that I was pleased that he brought the outside MRI scans performed at the end of last year so we could review them together. I confirmed the nonspecific T2 lesions in his right hemisphere and showed him comparison images of MRIs with MS so he would understand the difference. I pointed out that the lesions in his MRI scan are nonspecific and did not fit with the criteria of MS for size or location. ? ?Today, he did have issues with altered sensation and strength on examination consistent with his previous history but did not have any problems with word finding or communication. ? ?I confirmed that the cervical lesion identified earlier does compress his spinal cord and the MRI scan of his lumbar spine shows the previous surgery as well. I told him that the cervical lesion can be responsible for the muscle spasm as the spinal cord is compressed and with an acute injury this could become more serious.  ? ?He has been advised that surgery may be an appropriate route to take and is reluctant in light of his past experience with multiple surgeries including those on his knee that  have been problematic. I told him that this would be an elective choice that he would make and since he has a good relation to his neurosurgeon from before it would be wise to address this directly with the neurosurgeon as to his approach and what might be done. Question of possible disc replacement was raised and I told him that this would be a decision for the surgeon to make based upon experience and the features that are presented.  ? ?There is no need for me to start him on any medication today but I was able to reassure him that I am not seeing any evidence of MS as an issue that he needs to addressed while he does continue to need work with control of his numbness, tingling and pain with the pain management and continued monitoring with his surgeon as well.  ? ?There are no Patient Instructions on file for this visit. ? ?No prescriptions are sent to his pharmacy. Appointment will be made to Return if symptoms worsen, for PRN your request.. ? ?Thank you again for letting me participate in his care. ? ?Jaclyn Prime. Daphane Shepherd, MD MBA ? ?[facility] ?[facility] ?[address] ?[place], [place] [address] ?p [phone] \ f [phone]  [email or web address] ? ?Back Pain ?Pertinent negatives include no abdominal pain, chest pain, fever or headaches.  ? ? ?Prior to Admission medications   ?Medication Sig Start Date End Date Taking? Authorizing Provider  ?  ascorbic acid (VITAMIN C) 1000 MG tablet Take by mouth.   Yes [provider]  ?diltiazem (CARDIZEM) 30 MG tablet TAKE 1 TABLET EVERY DAY AS NEEDED FOR RECTAL SPASM PAINS ?Patient taking differently: Take 30 mg by mouth daily. TAKE 1 TABLET EVERY DAY 07/09/20  Yes Tkeya Stencil, Eilleen KempfMiguel Jose, MD  ?fluticasone [facility](FLONASE) 50 MCG/ACT nasal spray as needed.   Yes [provider]  ?HYDROmorphone (DILAUDID) 4 MG tablet Take 4 mg by mouth every 6 (six) hours as needed. 07/28/21  Yes [provider]  ?rosuvastatin (CRESTOR) 10  MG tablet Take 1 tablet (10 mg total) by mouth daily. 08/19/21 08/14/22 Yes Little IshikawaSchumann, Christopher L, MD  ?Semaglutide,0.25 or 0.5MG /DOS, (OZEMPIC, 0.25 OR 0.5 MG/DOSE,) 2 MG/1.5ML SOPN See admin instructions. 09/14/21  Yes [provider]  ?tiZANidine (ZANAFLEX) 4 MG tablet Take 1 tablet (4 mg total) by mouth every 8 (eight) hours as needed for muscle spasms. 06/18/19  Yes Georgina QuintSagardia, Cortavious Nix Jose, MD  ? ? ?Allergies  ?Allergen Reactions  ? Other Hives and Itching  ? Hydrocodone Hives and Itching  ? Oxycodone   ?  Mild Itching, patient can tolerate oxycodone  ? Percocet [Oxycodone-Acetaminophen]   ?  Itching, patient says he tolerates  ? ? ?Patient Active Problem List  ? Diagnosis Date Noted  ? History of tobacco use 02/06/2020  ? Other hyperlipidemia 07/18/2018  ? Depression 07/18/2018  ? Prediabetes 05/17/2018  ? Vitamin D deficiency 05/17/2018  ? Hearing loss 09/04/2015  ? Thrombocytopenia (HCC)   ? Pancytopenia (HCC) 06/24/2015  ? Status post lumbar laminectomy 06/10/2015  ? Class 2 severe obesity with serious comorbidity and body mass index (BMI) of 38.0 to 38.9 in adult [facility]HCC) 11/13/2014  ? ? ?Past Medical History:  ?Diagnosis Date  ? Anginal pain (HCC) 04/15/2014  ? Arthritis   ? "wrists, knees, lower back" (04/16/2014)  ? Carpal tunnel syndrome   ? Chronic bronchitis (HCC)   ? "probably get it q yr"  ? Chronic lower back pain   ? Chronic lower back pain   ? Constipation   ? Gall stones   ? GERD (gastroesophageal reflux disease)   ? GERD (gastroesophageal reflux disease)   ? HLD (hyperlipidemia)   ? Hyperlipidemia   ? Phreesia 04/14/2020  ? Joint pain   ? Meningitis   ? Plantar fasciitis   ? Right knee pain   ? Sciatica   ? ? ?Past Surgical History:  ?Procedure Laterality Date  ? CARPAL TUNNEL RELEASE    ? 02/2018 Right arm  ? CHOLECYSTECTOMY  2011  ? ESOPHAGOGASTRODUODENOSCOPY N/A 02/27/2013  ? Procedure: ESOPHAGOGASTRODUODENOSCOPY (EGD);  Surgeon: Florencia Reasonsobert V Buccini, MD;  [facility];  Service:  Endoscopy;  Laterality: N/A;  ? KNEE ARTHROSCOPY Right 2013 X 2  ? KNEE ARTHROSCOPY Right 02/22/2018  ? LAMINECTOMY AND MICRODISCECTOMY SPINE    ? LEFT HEART CATHETERIZATION WITH CORONARY ANGIOGRAM N/A 04/17/2014  ? Procedure: LEFT HEART CATHETERIZATION WITH CORONARY ANGIOGRAM;  Surgeon: Ricki RodriguezAjay S Kadakia, MD;  Location: [facility];  Service: Cardiovascular;  Laterality: N/A;  ? RADIOLOGY WITH ANESTHESIA N/A 06/11/2015  ? Procedure: MRI LUMBAR WITH AND WITHOUT CONTRAST;  Surgeon: Medication Radiologist, MD;  Location: [facility];  Service: Radiology;  Laterality: N/A;  ? SPINE SURGERY N/A   ? Phreesia 04/14/2020  ? ? ?Social History  ? ?Socioeconomic History  ? Marital status: Married  ?  Spouse name: Janelle Flooraomi   ? Number of children: 2  ? Years of education: Not on file  ?  Highest education level: Not on file  ?Occupational History  ? Occupation: Disabled  ?Tobacco Use  ? Smoking status: Former  ?  Packs/day: 0.50  ?  Years: 27.00  ?  Pack years: 13.50  ?  Types: Cigarettes  ?  Quit date: 03/11/2015  ?  Years since quitting: 6.5  ? Smokeless tobacco: Former  ?Vaping Use  ? Vaping Use: Never used  ?Substance and Sexual Activity  ? Alcohol use: Yes  ? Drug use: No  ?  Comment: 04/16/2014 "in my teens"  ? Sexual activity: Yes  ?Other Topics Concern  ? Not on file  ?Social History Narrative  ? Drinks 2 cups of coffee in the morning.  ? ?Social Determinants of Health  ? ?Financial Resource Strain: Not on file  ?Food Insecurity: Not on file  ?Transportation Needs: Not on file  ?Physical Activity: Not on file  ?Stress: Not on file  ?Social Connections: Not on file  ?Intimate Partner Violence: Not on file  ? ? ?Family History  ?Problem Relation Age of Onset  ? Multiple sclerosis Mother   ? Obesity Mother   ? Heart failure Maternal Grandfather   ? Diabetes Father   ? Hyperlipidemia Father   ? Sleep apnea Father   ? Obesity Father   ? Fibromyalgia Sister   ? Schizophrenia Brother   ? Stroke Maternal Grandmother   ? Diabetes Paternal  Grandmother   ? ? ? ?Review of Systems  ?Constitutional: Negative.  Negative for chills and fever.  ?HENT: Negative.    ?Respiratory: Negative.  Negative for cough and shortness of breath.   ?Cardiovascular

## 2021-09-30 NOTE — Assessment & Plan Note (Signed)
Diet and nutrition discussed.  Advised to decrease amount of daily carbohydrate intake.  Cardiovascular risks associated with diabetes discussed. ?

## 2021-09-30 NOTE — Assessment & Plan Note (Signed)
Diet and nutrition discussed.  Advised to continue rosuvastatin 10 mg daily. ?Lipid profile done today. ?

## 2021-09-30 NOTE — Patient Instructions (Signed)
Health Maintenance, Male Adopting a healthy lifestyle and getting preventive care are important in promoting health and wellness. Ask your health care provider about: The right schedule for you to have regular tests and exams. Things you can do on your own to prevent diseases and keep yourself healthy. What should I know about diet, weight, and exercise? Eat a healthy diet  Eat a diet that includes plenty of vegetables, fruits, low-fat dairy products, and lean protein. Do not eat a lot of foods that are high in solid fats, added sugars, or sodium. Maintain a healthy weight Body mass index (BMI) is a measurement that can be used to identify possible weight problems. It estimates body fat based on height and weight. Your health care provider can help determine your BMI and help you achieve or maintain a healthy weight. Get regular exercise Get regular exercise. This is one of the most important things you can do for your health. Most adults should: Exercise for at least 150 minutes each week. The exercise should increase your heart rate and make you sweat (moderate-intensity exercise). Do strengthening exercises at least twice a week. This is in addition to the moderate-intensity exercise. Spend less time sitting. Even light physical activity can be beneficial. Watch cholesterol and blood lipids Have your blood tested for lipids and cholesterol at 44 years of age, then have this test every 5 years. You may need to have your cholesterol levels checked more often if: Your lipid or cholesterol levels are high. You are older than 44 years of age. You are at high risk for heart disease. What should I know about cancer screening? Many types of cancers can be detected early and may often be prevented. Depending on your health history and family history, you may need to have cancer screening at various ages. This may include screening for: Colorectal cancer. Prostate cancer. Skin cancer. Lung  cancer. What should I know about heart disease, diabetes, and high blood pressure? Blood pressure and heart disease High blood pressure causes heart disease and increases the risk of stroke. This is more likely to develop in people who have high blood pressure readings or are overweight. Talk with your health care provider about your target blood pressure readings. Have your blood pressure checked: Every 3-5 years if you are 18-39 years of age. Every year if you are 40 years old or older. If you are between the ages of 65 and 75 and are a current or former smoker, ask your health care provider if you should have a one-time screening for abdominal aortic aneurysm (AAA). Diabetes Have regular diabetes screenings. This checks your fasting blood sugar level. Have the screening done: Once every three years after age 45 if you are at a normal weight and have a low risk for diabetes. More often and at a younger age if you are overweight or have a high risk for diabetes. What should I know about preventing infection? Hepatitis B If you have a higher risk for hepatitis B, you should be screened for this virus. Talk with your health care provider to find out if you are at risk for hepatitis B infection. Hepatitis C Blood testing is recommended for: Everyone born from 1945 through 1965. Anyone with known risk factors for hepatitis C. Sexually transmitted infections (STIs) You should be screened each year for STIs, including gonorrhea and chlamydia, if: You are sexually active and are younger than 44 years of age. You are older than 44 years of age and your   health care provider tells you that you are at risk for this type of infection. Your sexual activity has changed since you were last screened, and you are at increased risk for chlamydia or gonorrhea. Ask your health care provider if you are at risk. Ask your health care provider about whether you are at high risk for HIV. Your health care provider  may recommend a prescription medicine to help prevent HIV infection. If you choose to take medicine to prevent HIV, you should first get tested for HIV. You should then be tested every 3 months for as long as you are taking the medicine. Follow these instructions at home: Alcohol use Do not drink alcohol if your health care provider tells you not to drink. If you drink alcohol: Limit how much you have to 0-2 drinks a day. Know how much alcohol is in your drink. In the [place] one drink equals one 12 oz bottle of beer (355 mL), one 5 oz glass of wine (148 mL), or one 1 oz glass of hard liquor (44 mL). Lifestyle Do not use any products that contain nicotine or tobacco. These products include cigarettes, chewing tobacco, and vaping devices, such as e-cigarettes. If you need help quitting, ask your health care provider. Do not use street drugs. Do not share needles. Ask your health care provider for help if you need support or information about quitting drugs. General instructions Schedule regular health, dental, and eye exams. Stay current with your vaccines. Tell your health care provider if: You often feel depressed. You have ever been abused or do not feel safe at home. Summary Adopting a healthy lifestyle and getting preventive care are important in promoting health and wellness. Follow your health care provider's instructions about healthy diet, exercising, and getting tested or screened for diseases. Follow your health care provider's instructions on monitoring your cholesterol and blood pressure. This information is not intended to replace advice given to you by your health care provider. Make sure you discuss any questions you have with your health care provider. Document Revised: 10/06/2020 Document Reviewed: 10/06/2020 Elsevier Patient Education  2023 Elsevier Inc.  

## 2021-12-19 NOTE — Progress Notes (Unsigned)
Cardiology Office Note:    Date:  12/23/2021   ID:  Bruce Morales, DOB 05-05-1976, MRN [id number]  PCP:  [name], MD  Cardiologist:  Little Ishikawa, MD  Electrophysiologist:  None   Referring MD: [name], *   Chief Complaint  Patient presents with   Follow-up   Coronary Artery Disease    History of Present Illness:    Bruce Morales is a 46 y.o. male with a hx of hyperlipidemia, tobacco use, hypertension who presents for follow-up.  He was referred by Dr. Alvy Bimler for evaluation of chest pain, initially seen on 08/20/2018.  Seen in the ED on 08/16/2021 with chest pain.  Troponins negative x2.  He reports that he was having intermittent chest pain for one week prior to ED visit.  Reports pain across entire chest, starts off feeling warm and then sharp pain.  Occurred at rest.  He does not exercise due to back pain.  Pain will last for about 5 minutes or so and resolve.  He then woke up on the morning of 3/19 with chest pain.  Pain lasted for 15 to 20 minutes and prompted him to go to the ED.  He reports since that time he has had mild chest soreness.  Also reports dyspnea with minimal exertion.  Reports occasional lightheadedness but denies any syncope.  Reports mild lower extremity edema.  Reports he gets pain in the back of his legs when he walks.  Denies any palpitations.  He smoked 1.5 to 2 packs/day, smoked for almost 30 years but quit in 2016.  Family history includes paternal grandfather had heart failure and maternal grandfather had CABG.  Calcium score on 12/19/2020 was 6 (80th percentile).  Echocardiogram 08/26/2021 was technically difficult study but grossly normal biventricular function and no significant valvular disease.  Coronary CTA on 09/15/2021 showed minimal nonobstructive CAD (calcium score 12, 83rd percentile).  Since last clinic visit, he reports that he has been doing well.  Has lost almost 40 pounds.  Walking 6 to 8 miles per day.   Denies any exertional symptoms.  Denies any chest pain, dyspnea, lightheadedness, or syncope.  Reports occasional lower extremity edema.     Wt Readings from Last 3 Encounters:  12/23/21 231 lb 6.4 oz (105 kg)  09/30/21 266 lb 8 oz (120.9 kg)  08/19/21 265 lb 3.2 oz (120.3 kg)      Past Medical History:  Diagnosis Date   Anginal pain (HCC) 04/15/2014   Arthritis    "wrists, knees, lower back" (04/16/2014)   Carpal tunnel syndrome    Chronic bronchitis (HCC)    "probably get it q yr"   Chronic lower back pain    Chronic lower back pain    Constipation    Gall stones    GERD (gastroesophageal reflux disease)    GERD (gastroesophageal reflux disease)    HLD (hyperlipidemia)    Hyperlipidemia    Phreesia 04/14/2020   Joint pain    Meningitis    Plantar fasciitis    Right knee pain    Sciatica     Past Surgical History:  Procedure Laterality Date   CARPAL TUNNEL RELEASE     02/2018 Right arm   CHOLECYSTECTOMY  2011   ESOPHAGOGASTRODUODENOSCOPY N/A 02/27/2013   Procedure: ESOPHAGOGASTRODUODENOSCOPY (EGD);  Surgeon: Florencia Reasons, MD;  Location: [facility];  Service: Endoscopy;  Laterality: N/A;   KNEE ARTHROSCOPY Right 2013 X 2   KNEE ARTHROSCOPY Right 02/22/2018  LAMINECTOMY AND MICRODISCECTOMY SPINE     LEFT HEART CATHETERIZATION WITH CORONARY ANGIOGRAM N/A 04/17/2014   Procedure: LEFT HEART CATHETERIZATION WITH CORONARY ANGIOGRAM;  Surgeon: Ricki Rodriguez, MD;  Location: [facility];  Service: Cardiovascular;  Laterality: N/A;   RADIOLOGY WITH ANESTHESIA N/A 06/11/2015   Procedure: MRI LUMBAR WITH AND WITHOUT CONTRAST;  Surgeon: Medication Radiologist, MD;  Location: [facility] OR;  Service: Radiology;  Laterality: N/A;   SPINE SURGERY N/A    Phreesia 04/14/2020    Current Medications: Current Meds  Medication Sig   ascorbic acid (VITAMIN C) 1000 MG tablet Take by mouth.   CLOMID 50 MG tablet Take 25 mg by mouth daily.   diltiazem (CARDIZEM) 30 MG tablet TAKE 1  TABLET EVERY DAY AS NEEDED FOR RECTAL SPASM PAINS (Patient taking differently: Take 30 mg by mouth daily. TAKE 1 TABLET EVERY DAY)   GREEN TEA, CAMELLIA SINENSIS, PO Take by mouth daily in the afternoon.   HYDROmorphone (DILAUDID) 4 MG tablet Take 4 mg by mouth every 6 (six) hours as needed.   rosuvastatin (CRESTOR) 10 MG tablet Take 1 tablet (10 mg total) by mouth daily.   Semaglutide,0.25 or 0.5MG /DOS, (OZEMPIC, 0.25 OR 0.5 MG/DOSE,) 2 MG/1.5ML SOPN See admin instructions.   tiZANidine (ZANAFLEX) 4 MG tablet Take 1 tablet (4 mg total) by mouth every 8 (eight) hours as needed for muscle spasms.     Allergies:   Other, Hydrocodone, Oxycodone, and Percocet [oxycodone-acetaminophen]   Social History   Socioeconomic History   Marital status: Married    Spouse name: Janelle Floor    Number of children: 2   Years of education: Not on file   Highest education level: Not on file  Occupational History   Occupation: Disabled  Tobacco Use   Smoking status: Former    Packs/day: 0.50    Years: 27.00    Total pack years: 13.50    Types: Cigarettes    Quit date: 03/11/2015    Years since quitting: 6.7   Smokeless tobacco: Former  Building services engineer Use: Never used  Substance and Sexual Activity   Alcohol use: Yes   Drug use: No    Comment: 04/16/2014 "in my teens"   Sexual activity: Yes  Other Topics Concern   Not on file  Social History Narrative   Drinks 2 cups of coffee in the morning.   Social Determinants of Health   Financial Resource Strain: Not on file  Food Insecurity: Not on file  Transportation Needs: Not on file  Physical Activity: Not on file  Stress: Not on file  Social Connections: Not on file     Family History: The patient's family history includes Diabetes in his father and paternal grandmother; Fibromyalgia in his sister; Heart failure in his maternal grandfather; Hyperlipidemia in his father; Multiple sclerosis in his mother; Obesity in his father and mother;  Schizophrenia in his brother; Sleep apnea in his father; Stroke in his maternal grandmother.  ROS:   Please see the history of present illness.     All other systems reviewed and are negative.  EKGs/Labs/Other Studies Reviewed:    The following studies were reviewed today:   EKG:   08/16/2021: Normal sinus rhythm, first-degree AV block, low voltage, rate 61   Recent Labs: 08/16/2021: Hemoglobin 14.9; Platelets 120 09/30/2021: ALT 57; BUN 21; Creatinine, Ser 1.15; Potassium 4.8; Sodium 138  Recent Lipid Panel    Component Value Date/Time   CHOL 220 (H) 09/30/2021 2130  CHOL 278 (H) 04/17/2020 1042   TRIG 136.0 09/30/2021 0839   HDL 34.30 (L) 09/30/2021 0839   HDL 37 (L) 04/17/2020 1042   CHOLHDL 6 09/30/2021 0839   VLDL 27.2 09/30/2021 0839   LDLCALC 158 (H) 09/30/2021 0839   LDLCALC 224 (H) 04/17/2020 1042    Physical Exam:    VS:  BP 122/76   Pulse 72   Ht 5\' 8"  (1.727 m)   Wt 231 lb 6.4 oz (105 kg)   SpO2 97%   BMI 35.18 kg/m     Wt Readings from Last 3 Encounters:  12/23/21 231 lb 6.4 oz (105 kg)  09/30/21 266 lb 8 oz (120.9 kg)  08/19/21 265 lb 3.2 oz (120.3 kg)     GEN:  Well nourished, well developed in no acute distress HEENT: Normal NECK: No JVD; No carotid bruits LYMPHATICS: No lymphadenopathy CARDIAC: RRR, no murmurs, rubs, gallops RESPIRATORY:  Clear to auscultation without rales, wheezing or rhonchi  ABDOMEN: Soft, non-tender, non-distended MUSCULOSKELETAL:  No edema; No deformity  SKIN: Warm and dry NEUROLOGIC:  Alert and oriented x 3 PSYCHIATRIC:  Normal affect   ASSESSMENT:    1. Coronary artery disease involving native coronary artery of native heart without angina pectoris   2. Chest pain of uncertain etiology   3. Hyperlipidemia, unspecified hyperlipidemia type     PLAN:    CAD: Seen in the ED on 08/16/2021 with chest pain.  Troponins negative x2.  Calcium score on 12/19/2020 was 6 (80th percentile).  Description of chest pain is  atypical.  Echocardiogram 08/26/2021 was technically difficult study but grossly normal biventricular function and no significant valvular disease.  Coronary CTA on 09/15/2021 showed minimal nonobstructive CAD (calcium score 12, 83rd percentile). -Continue rosuvastatin 10 mg daily  Hyperlipidemia: LDL 170 on 12/15/2020.  Started rosuvastatin 10 mg daily 07/2021.  LDL 61 11/2021.  Leg pain: Normal ABIs 09/16/2021.  Obesity: on Ozempic.  Exercising daily and has changed his diet, has lost 40 lbs.    Snoring/daytime somnolence: Recommend sleep study.  Patient states that he will consider  RTC in 1 year   Medication Adjustments/Labs and Tests Ordered: Current medicines are reviewed at length with the patient today.  Concerns regarding medicines are outlined above.  No orders of the defined types were placed in this encounter.  No orders of the defined types were placed in this encounter.   Patient Instructions  Medication Instructions:  Your physician recommends that you continue on your current medications as directed. Please refer to the Current Medication list given to you today.  *If you need a refill on your cardiac medications before your next appointment, please call your pharmacy*  Follow-Up: At [facility], you and your health needs are our priority.  As part of our continuing mission to provide you with exceptional heart care, we have created designated Provider Care Teams.  These Care Teams include your primary Cardiologist (physician) and Advanced Practice Providers (APPs -  Physician Assistants and Nurse Practitioners) who all work together to provide you with the care you need, when you need it.  We recommend signing up for the patient portal called "MyChart".  Sign up information is provided on this After Visit Summary.  MyChart is used to connect with patients for Virtual Visits (Telemedicine).  Patients are able to view lab/test results, encounter notes, upcoming appointments,  etc.  Non-urgent messages can be sent to your provider as well.   To learn more about what you can do  with MyChart, go to [email or web address].    Your next appointment:   12 month(s)  The format for your next appointment:   In Person  Provider:   Donato Heinz, MD {    Important Information About Sugar         Signed, Donato Heinz, MD  12/23/2021 9:53 AM    [place]

## 2021-12-23 ENCOUNTER — Encounter: Payer: Self-pay | Admitting: Cardiology

## 2021-12-23 ENCOUNTER — Ambulatory Visit: Payer: Medicare Other | Admitting: Cardiology

## 2021-12-23 VITALS — BP 122/76 | HR 72 | Ht 68.0 in | Wt 231.4 lb

## 2021-12-23 DIAGNOSIS — I2510 Atherosclerotic heart disease of native coronary artery without angina pectoris: Secondary | ICD-10-CM

## 2021-12-23 DIAGNOSIS — R079 Chest pain, unspecified: Secondary | ICD-10-CM | POA: Diagnosis not present

## 2021-12-23 DIAGNOSIS — E785 Hyperlipidemia, unspecified: Secondary | ICD-10-CM

## 2021-12-23 NOTE — Patient Instructions (Addendum)
Medication Instructions:  Your physician recommends that you continue on your current medications as directed. Please refer to the Current Medication list given to you today.  *If you need a refill on your cardiac medications before your next appointment, please call your pharmacy*  Follow-Up: At [facility], you and your health needs are our priority.  As part of our continuing mission to provide you with exceptional heart care, we have created designated Provider Care Teams.  These Care Teams include your primary Cardiologist (physician) and Advanced Practice Providers (APPs -  Physician Assistants and Nurse Practitioners) who all work together to provide you with the care you need, when you need it.  We recommend signing up for the patient portal called "MyChart".  Sign up information is provided on this After Visit Summary.  MyChart is used to connect with patients for Virtual Visits (Telemedicine).  Patients are able to view lab/test results, encounter notes, upcoming appointments, etc.  Non-urgent messages can be sent to your provider as well.   To learn more about what you can do with MyChart, go to [email or web address].    Your next appointment:   12 month(s)  The format for your next appointment:   In Person  Provider:   Christopher L Schumann, MD {    Important Information About Sugar       

## 2022-02-14 ENCOUNTER — Telehealth: Payer: Medicare Other | Admitting: Nurse Practitioner

## 2022-02-14 DIAGNOSIS — L03039 Cellulitis of unspecified toe: Secondary | ICD-10-CM | POA: Diagnosis not present

## 2022-02-14 MED ORDER — SULFAMETHOXAZOLE-TRIMETHOPRIM 800-160 MG PO TABS: 1.0000 | ORAL_TABLET | Freq: Two times a day (BID) | ORAL | 0 refills | Status: AC

## 2022-02-14 NOTE — Progress Notes (Signed)
Virtual Visit Consent   Bruce Morales, you are scheduled for a virtual visit with a [facility] provider today. Just as with appointments in the office, your consent must be obtained to participate. Your consent will be active for this visit and any virtual visit you may have with one of our providers in the next 365 days. If you have a MyChart account, a copy of this consent can be sent to you electronically.  As this is a virtual visit, video technology does not allow for your provider to perform a traditional examination. This may limit your provider's ability to fully assess your condition. If your provider identifies any concerns that need to be evaluated in person or the need to arrange testing (such as labs, EKG, etc.), we will make arrangements to do so. Although advances in technology are sophisticated, we cannot ensure that it will always work on either your end or our end. If the connection with a video visit is poor, the visit may have to be switched to a telephone visit. With either a video or telephone visit, we are not always able to ensure that we have a secure connection.  By engaging in this virtual visit, you consent to the provision of healthcare and authorize for your insurance to be billed (if applicable) for the services provided during this visit. Depending on your insurance coverage, you may receive a charge related to this service.  I need to obtain your verbal consent now. Are you willing to proceed with your visit today? Bruce Morales has provided verbal consent on 02/14/2022 for a virtual visit (video or telephone). Gildardo Pounds, NP  Date: 02/14/2022 4:59 PM  Virtual Visit via Video Note   I, Gildardo Pounds, connected with  TUNIS Morales  ([id number], March 25, 1976) on 02/14/22 at  4:15 PM EDT by a video-enabled telemedicine application and verified that I am speaking with the correct person using two identifiers.  Location: Patient: Virtual Visit Location  Patient: Home Provider: Virtual Visit Location Provider: Home Office   I discussed the limitations of evaluation and management by telemedicine and the availability of in person appointments. The patient expressed understanding and agreed to proceed.    History of Present Illness: Bruce Morales is a 46 y.o. who identifies as a male who was assigned male at birth, and is being seen today for Bilateral big toe cellulitis.  Mr. [name] states he had bilateral  ingrown toenail removal surgery last Monday. Over the past several days he has noticed redness surrounding both big toes along with pain and purulent drainage when he applies pressure to the toes. Has been soaking both feet in epsom salt with no relief of symptoms.    Problems:  Patient Active Problem List   Diagnosis Date Noted   Hepatic steatosis 09/30/2021   History of tobacco use 02/06/2020   Other hyperlipidemia 07/18/2018   Depression 07/18/2018   Prediabetes 05/17/2018   Vitamin D deficiency 05/17/2018   Hearing loss 09/04/2015   Thrombocytopenia (HCC)    Pancytopenia ([place]) 06/24/2015   Status post lumbar laminectomy 06/10/2015   Class 2 severe obesity with serious comorbidity and body mass index (BMI) of 38.0 to 38.9 in adult [facility]) 11/13/2014    Allergies:  Allergies  Allergen Reactions   Other Hives and Itching   Hydrocodone Hives and Itching   Oxycodone     Mild Itching, patient can tolerate oxycodone   Percocet [Oxycodone-Acetaminophen]     Itching, patient says he  tolerates   Medications:  Current Outpatient Medications:    sulfamethoxazole-trimethoprim (BACTRIM DS) 800-160 MG tablet, Take 1 tablet by mouth 2 (two) times daily for 7 days., Disp: 14 tablet, Rfl: 0   ascorbic acid (VITAMIN C) 1000 MG tablet, Take by mouth., Disp: , Rfl:    CLOMID 50 MG tablet, Take 25 mg by mouth daily., Disp: , Rfl:    diltiazem (CARDIZEM) 30 MG tablet, TAKE 1 TABLET EVERY DAY AS NEEDED FOR RECTAL SPASM PAINS (Patient  taking differently: Take 30 mg by mouth daily. TAKE 1 TABLET EVERY DAY), Disp: 90 tablet, Rfl: 1   GREEN TEA, CAMELLIA SINENSIS, PO, Take by mouth daily in the afternoon., Disp: , Rfl:    HYDROmorphone (DILAUDID) 4 MG tablet, Take 4 mg by mouth every 6 (six) hours as needed., Disp: , Rfl:    rosuvastatin (CRESTOR) 10 MG tablet, Take 1 tablet (10 mg total) by mouth daily., Disp: 90 tablet, Rfl: 3   Semaglutide,0.25 or 0.5MG /DOS, (OZEMPIC, 0.25 OR 0.5 MG/DOSE,) 2 MG/1.5ML SOPN, See admin instructions., Disp: , Rfl:    tiZANidine (ZANAFLEX) 4 MG tablet, Take 1 tablet (4 mg total) by mouth every 8 (eight) hours as needed for muscle spasms., Disp: 30 tablet, Rfl: 1  Observations/Objective: Patient is well-developed, well-nourished in no acute distress.  Resting comfortably at home.  Head is normocephalic, atraumatic.  No labored breathing.  Speech is clear and coherent with logical content.  Patient is alert and oriented at baseline.  Both toes are erythematous and swollen. No purulent drainage visualized on exam today .   Assessment and Plan: 1. Cellulitis of toe, unspecified laterality - sulfamethoxazole-trimethoprim (BACTRIM DS) 800-160 MG tablet; Take 1 tablet by mouth 2 (two) times daily for 7 days.  Dispense: 14 tablet; Refill: 0 Follow up with PCP or podiatry this week   Follow Up Instructions: I discussed the assessment and treatment plan with the patient. The patient was provided an opportunity to ask questions and all were answered. The patient agreed with the plan and demonstrated an understanding of the instructions.  A copy of instructions were sent to the patient via MyChart unless otherwise noted below.   The patient was advised to call back or seek an in-person evaluation if the symptoms worsen or if the condition fails to improve as anticipated.  Time:  I spent 11 minutes with the patient via telehealth technology discussing the above problems/concerns.    Claiborne Rigg, NP

## 2022-02-14 NOTE — Patient Instructions (Signed)
  Bruce Morales, thank you for joining Gildardo Pounds, NP for today's virtual visit.  While this provider is not your primary care provider (PCP), if your PCP is located in our provider database this encounter information will be shared with them immediately following your visit.  Consent: (Patient) Bruce Morales provided verbal consent for this virtual visit at the beginning of the encounter.  Current Medications:  Current Outpatient Medications:    sulfamethoxazole-trimethoprim (BACTRIM DS) 800-160 MG tablet, Take 1 tablet by mouth 2 (two) times daily for 7 days., Disp: 14 tablet, Rfl: 0   ascorbic acid (VITAMIN C) 1000 MG tablet, Take by mouth., Disp: , Rfl:    CLOMID 50 MG tablet, Take 25 mg by mouth daily., Disp: , Rfl:    diltiazem (CARDIZEM) 30 MG tablet, TAKE 1 TABLET EVERY DAY AS NEEDED FOR RECTAL SPASM PAINS (Patient taking differently: Take 30 mg by mouth daily. TAKE 1 TABLET EVERY DAY), Disp: 90 tablet, Rfl: 1   GREEN TEA, CAMELLIA SINENSIS, PO, Take by mouth daily in the afternoon., Disp: , Rfl:    HYDROmorphone (DILAUDID) 4 MG tablet, Take 4 mg by mouth every 6 (six) hours as needed., Disp: , Rfl:    rosuvastatin (CRESTOR) 10 MG tablet, Take 1 tablet (10 mg total) by mouth daily., Disp: 90 tablet, Rfl: 3   Semaglutide,0.25 or 0.5MG /DOS, (OZEMPIC, 0.25 OR 0.5 MG/DOSE,) 2 MG/1.5ML SOPN, See admin instructions., Disp: , Rfl:    tiZANidine (ZANAFLEX) 4 MG tablet, Take 1 tablet (4 mg total) by mouth every 8 (eight) hours as needed for muscle spasms., Disp: 30 tablet, Rfl: 1   Medications ordered in this encounter:  Meds ordered this encounter  Medications   sulfamethoxazole-trimethoprim (BACTRIM DS) 800-160 MG tablet    Sig: Take 1 tablet by mouth 2 (two) times daily for 7 days.    Dispense:  14 tablet    Refill:  0    Order Specific Question:   Supervising Provider    Answer:   Sabra Heck, BRIAN [[id number]]     *If you need refills on other medications prior to your next  appointment, please contact your pharmacy*  Follow-Up: Call back or seek an in-person evaluation if the symptoms worsen or if the condition fails to improve as anticipated.  Other Instructions Follow up with PCP or Podiatry next week.    If you have been instructed to have an in-person evaluation today at a local Urgent Care facility, please use the link below. It will take you to a list of all of our available [facility], including address, phone number and hours of operation. Please do not delay care.  [facility]  If you or a family member do not have a primary care provider, use the link below to schedule a visit and establish care. When you choose a [facility] primary care physician or advanced practice provider, you gain a long-term partner in health. Find a Primary Care Provider  Learn more about 's in-office and virtual care options: [facility]

## 2022-02-17 ENCOUNTER — Ambulatory Visit: Payer: Medicare Other | Admitting: Emergency Medicine

## 2022-02-25 ENCOUNTER — Telehealth: Payer: Medicare Other | Admitting: Physician Assistant

## 2022-02-25 DIAGNOSIS — K622 Anal prolapse: Secondary | ICD-10-CM

## 2022-02-25 NOTE — Patient Instructions (Signed)
  Bruce Morales, thank you for joining [name], PA-C for today's virtual visit.  While this provider is not your primary care provider (PCP), if your PCP is located in our provider database this encounter information will be shared with them immediately following your visit.  Consent: (Patient) Bruce Morales provided verbal consent for this virtual visit at the beginning of the encounter.  Current Medications:  Current Outpatient Medications:    ascorbic acid (VITAMIN C) 1000 MG tablet, Take by mouth., Disp: , Rfl:    CLOMID 50 MG tablet, Take 25 mg by mouth daily., Disp: , Rfl:    diltiazem (CARDIZEM) 30 MG tablet, TAKE 1 TABLET EVERY DAY AS NEEDED FOR RECTAL SPASM PAINS (Patient taking differently: Take 30 mg by mouth daily. TAKE 1 TABLET EVERY DAY), Disp: 90 tablet, Rfl: 1   GREEN TEA, CAMELLIA SINENSIS, PO, Take by mouth daily in the afternoon., Disp: , Rfl:    HYDROmorphone (DILAUDID) 4 MG tablet, Take 4 mg by mouth every 6 (six) hours as needed., Disp: , Rfl:    rosuvastatin (CRESTOR) 10 MG tablet, Take 1 tablet (10 mg total) by mouth daily., Disp: 90 tablet, Rfl: 3   Semaglutide,0.25 or 0.5MG /DOS, (OZEMPIC, 0.25 OR 0.5 MG/DOSE,) 2 MG/1.5ML SOPN, See admin instructions., Disp: , Rfl:    tiZANidine (ZANAFLEX) 4 MG tablet, Take 1 tablet (4 mg total) by mouth every 8 (eight) hours as needed for muscle spasms., Disp: 30 tablet, Rfl: 1   Medications ordered in this encounter:  No orders of the defined types were placed in this encounter.    *If you need refills on other medications prior to your next appointment, please contact your pharmacy*  Follow-Up: Call back or seek an in-person evaluation if the symptoms worsen or if the condition fails to improve as anticipated.  [facility] [phone]  Other Instructions  Please use link below to help be seen at Urgent Care this evening as discussed. I would not delay evaluation.    If you have been  instructed to have an in-person evaluation today at a local Urgent Care facility, please use the link below. It will take you to a list of all of our available [facility], including address, phone number and hours of operation. Please do not delay care.  [facility]  If you or a family member do not have a primary care provider, use the link below to schedule a visit and establish care. When you choose a [facility] primary care physician or advanced practice provider, you gain a long-term partner in health. Find a Primary Care Provider  Learn more about [facility]'s in-office and virtual care options: [facility]

## 2022-02-25 NOTE — Progress Notes (Signed)
Virtual Visit Consent   Bruce Morales, you are scheduled for a virtual visit with a [facility] provider today. Just as with appointments in the office, your consent must be obtained to participate. Your consent will be active for this visit and any virtual visit you may have with one of our providers in the next 365 days. If you have a MyChart account, a copy of this consent can be sent to you electronically.  As this is a virtual visit, video technology does not allow for your provider to perform a traditional examination. This may limit your provider's ability to fully assess your condition. If your provider identifies any concerns that need to be evaluated in person or the need to arrange testing (such as labs, EKG, etc.), we will make arrangements to do so. Although advances in technology are sophisticated, we cannot ensure that it will always work on either your end or our end. If the connection with a video visit is poor, the visit may have to be switched to a telephone visit. With either a video or telephone visit, we are not always able to ensure that we have a secure connection.  By engaging in this virtual visit, you consent to the provision of healthcare and authorize for your insurance to be billed (if applicable) for the services provided during this visit. Depending on your insurance coverage, you may receive a charge related to this service.  I need to obtain your verbal consent now. Are you willing to proceed with your visit today? Bruce Morales has provided verbal consent on 02/25/2022 for a virtual visit (video or telephone). [name], [place]  Date: 02/25/2022 4:33 PM  Virtual Visit via Video Note   I, [name], connected with  Bruce Morales  ([id number], May 22, 1976) on 02/25/22 at  4:30 PM EDT by a video-enabled telemedicine application and verified that I am speaking with the correct person using two identifiers.  Location: Patient: Virtual  Visit Location Patient: Home Provider: Virtual Visit Location Provider: Home Office   I discussed the limitations of evaluation and management by telemedicine and the availability of in person appointments. The patient expressed understanding and agreed to proceed.    History of Present Illness: Bruce Morales is a 46 y.o. who identifies as a male who was assigned male at birth, and is being seen today for possible hemorrhoid versus rectal prolapse. Notes a harder protrusion from anu occurring after bowel movement. Notes he pushed back in but area keeps coming out. Notes substantial pain in the area. Denies any rectal bleeding. Is supposed to leave for a drive to [place] later today so he was not sure what to do.   HPI: HPI  Problems:  Patient Active Problem List   Diagnosis Date Noted   Hepatic steatosis 09/30/2021   History of tobacco use 02/06/2020   Other hyperlipidemia 07/18/2018   Depression 07/18/2018   Prediabetes 05/17/2018   Vitamin D deficiency 05/17/2018   Hearing loss 09/04/2015   Thrombocytopenia ([place])    Pancytopenia ([place]) 06/24/2015   Status post lumbar laminectomy 06/10/2015   Class 2 severe obesity with serious comorbidity and body mass index (BMI) of 38.0 to 38.9 in adult [facility]) 11/13/2014    Allergies:  Allergies  Allergen Reactions   Other Hives and Itching   Hydrocodone Hives and Itching   Oxycodone     Mild Itching, patient can tolerate oxycodone   Percocet [Oxycodone-Acetaminophen]     Itching, patient says he tolerates  Medications:  Current Outpatient Medications:    ascorbic acid (VITAMIN C) 1000 MG tablet, Take by mouth., Disp: , Rfl:    CLOMID 50 MG tablet, Take 25 mg by mouth daily., Disp: , Rfl:    diltiazem (CARDIZEM) 30 MG tablet, TAKE 1 TABLET EVERY DAY AS NEEDED FOR RECTAL SPASM PAINS (Patient taking differently: Take 30 mg by mouth daily. TAKE 1 TABLET EVERY DAY), Disp: 90 tablet, Rfl: 1   GREEN TEA, CAMELLIA SINENSIS, PO, Take by mouth  daily in the afternoon., Disp: , Rfl:    HYDROmorphone (DILAUDID) 4 MG tablet, Take 4 mg by mouth every 6 (six) hours as needed., Disp: , Rfl:    rosuvastatin (CRESTOR) 10 MG tablet, Take 1 tablet (10 mg total) by mouth daily., Disp: 90 tablet, Rfl: 3   Semaglutide,0.25 or 0.5MG /DOS, (OZEMPIC, 0.25 OR 0.5 MG/DOSE,) 2 MG/1.5ML SOPN, See admin instructions., Disp: , Rfl:    tiZANidine (ZANAFLEX) 4 MG tablet, Take 1 tablet (4 mg total) by mouth every 8 (eight) hours as needed for muscle spasms., Disp: 30 tablet, Rfl: 1  Observations/Objective: Patient is well-developed, well-nourished in no acute distress.  Resting comfortably at home.  Head is normocephalic, atraumatic.  No labored breathing. Speech is clear and coherent with logical content.  Patient is alert and oriented at baseline.   Assessment and Plan: 1. Prolapsed, anal  Prolapse versus possible hemorrhoid. Cannot visualize on camera due to lack of chaperone and he cannot get camera to stay on. He needs in person evaluation ASAP. UC recommended since PCP office is closing. Link given so he can see which facility is going to be the quickest for him.   Follow Up Instructions: I discussed the assessment and treatment plan with the patient. The patient was provided an opportunity to ask questions and all were answered. The patient agreed with the plan and demonstrated an understanding of the instructions.  A copy of instructions were sent to the patient via MyChart unless otherwise noted below.   The patient was advised to call back or seek an in-person evaluation if the symptoms worsen or if the condition fails to improve as anticipated.  Time:  I spent 10 minutes with the patient via telehealth technology discussing the above problems/concerns.    Piedad Climes, PA-C

## 2022-03-03 ENCOUNTER — Ambulatory Visit (INDEPENDENT_AMBULATORY_CARE_PROVIDER_SITE_OTHER): Payer: Medicare Other | Admitting: Emergency Medicine

## 2022-03-03 ENCOUNTER — Encounter: Payer: Self-pay | Admitting: Emergency Medicine

## 2022-03-03 VITALS — BP 122/74 | HR 78 | Temp 98.4°F | Ht 68.0 in | Wt 220.4 lb

## 2022-03-03 DIAGNOSIS — L600 Ingrowing nail: Secondary | ICD-10-CM | POA: Insufficient documentation

## 2022-03-03 DIAGNOSIS — K610 Anal abscess: Secondary | ICD-10-CM | POA: Diagnosis not present

## 2022-03-03 MED ORDER — CEFADROXIL 500 MG PO CAPS: 500.0000 mg | ORAL_CAPSULE | Freq: Two times a day (BID) | ORAL | 0 refills | Status: AC

## 2022-03-03 NOTE — Assessment & Plan Note (Signed)
Healing well but may have early infection of the left big toe. Start hydroxyzine 500 mg twice a day for 7 days.

## 2022-03-03 NOTE — Assessment & Plan Note (Signed)
We will start antibiotics Cefadroxil 500 mg twice a day Needs evaluation by colorectal surgeon ASAP Will need incision and drainage May need to go back to urgent care center for procedure

## 2022-03-03 NOTE — Progress Notes (Signed)
Bruce Morales 46 y.o.   Chief Complaint  Patient presents with   ingrown toe nail    Patient had ingrown toe nail surgery several weeks ago, patient still notice redness,painful   Hemorrhoids    Pt seen in ED on thurdsday for hemorrhoid, still having some issues     HISTORY OF PRESENT ILLNESS: This is a 46 y.o. male complaining of ingrown toenail recent surgery today with some redness and swelling may have infection. Also complaining of swollen and tender hemorrhoids.  Was seen at urgent care center recently and given suppositories and lidocaine cream.  Still hurting.  No bleeding.  HPI   Prior to Admission medications   Medication Sig Start Date End Date Taking? Authorizing Provider  ascorbic acid (VITAMIN C) 1000 MG tablet Take by mouth.   Yes [provider]  cefadroxil (DURICEF) 500 MG capsule Take 1 capsule (500 mg total) by mouth 2 (two) times daily for 7 days. 03/03/22 03/10/22 Yes Laporscha Linehan, Ines Bloomer, MD  diltiazem (CARDIZEM) 30 MG tablet TAKE 1 TABLET EVERY DAY AS NEEDED FOR RECTAL SPASM PAINS Patient taking differently: Take 30 mg by mouth daily. TAKE 1 TABLET EVERY DAY 07/09/20  Yes Liston Thum, [place], MD  GREEN TEA, CAMELLIA SINENSIS, PO Take by mouth daily in the afternoon.   Yes [provider]  HYDROmorphone (DILAUDID) 4 MG tablet Take 4 mg by mouth every 6 (six) hours as needed. 07/28/21  Yes [provider]  Semaglutide,0.25 or 0.5MG /DOS, (OZEMPIC, 0.25 OR 0.5 MG/DOSE,) 2 MG/1.5ML SOPN See admin instructions. 09/14/21  Yes [provider]  tiZANidine (ZANAFLEX) 4 MG tablet Take 1 tablet (4 mg total) by mouth every 8 (eight) hours as needed for muscle spasms. 06/18/19  Yes Garik Diamant, Ines Bloomer, MD  CLOMID 50 MG tablet Take 25 mg by mouth daily. Patient not taking: Reported on 03/03/2022 11/12/21   [provider]  rosuvastatin (CRESTOR) 10 MG tablet Take 1 tablet (10 mg total) by mouth daily. Patient not taking: Reported  on 03/03/2022 08/19/21 08/14/22  Donato Heinz, MD    Allergies  Allergen Reactions   Other Hives and Itching   Hydrocodone Hives and Itching   Oxycodone     Mild Itching, patient can tolerate oxycodone   Percocet [Oxycodone-Acetaminophen]     Itching, patient says he tolerates    Patient Active Problem List   Diagnosis Date Noted   Hepatic steatosis 09/30/2021   History of tobacco use 02/06/2020   Other hyperlipidemia 07/18/2018   Depression 07/18/2018   Prediabetes 05/17/2018   Vitamin D deficiency 05/17/2018   Hearing loss 09/04/2015   Thrombocytopenia ([place])    Pancytopenia ([place]) 06/24/2015   Status post lumbar laminectomy 06/10/2015   Class 2 severe obesity with serious comorbidity and body mass index (BMI) of 38.0 to 38.9 in adult [facility]) 11/13/2014    Past Medical History:  Diagnosis Date   Anginal pain ([place]) 04/15/2014   Arthritis    "wrists, knees, lower back" (04/16/2014)   Carpal tunnel syndrome    Chronic bronchitis ([place])    "probably get it q yr"   Chronic lower back pain    Chronic lower back pain    Constipation    Gall stones    GERD (gastroesophageal reflux disease)    GERD (gastroesophageal reflux disease)    HLD (hyperlipidemia)    Hyperlipidemia    Phreesia 04/14/2020   Joint pain    Meningitis    Plantar fasciitis    Right knee  pain    Sciatica     Past Surgical History:  Procedure Laterality Date   CARPAL TUNNEL RELEASE     02/2018 Right arm   CHOLECYSTECTOMY  2011   ESOPHAGOGASTRODUODENOSCOPY N/A 02/27/2013   Procedure: ESOPHAGOGASTRODUODENOSCOPY (EGD);  Surgeon: Cleotis Nipper, MD;  Location: [facility];  Service: Endoscopy;  Laterality: N/A;   KNEE ARTHROSCOPY Right 2013 X 2   KNEE ARTHROSCOPY Right 02/22/2018   LAMINECTOMY AND MICRODISCECTOMY SPINE     LEFT HEART CATHETERIZATION WITH CORONARY ANGIOGRAM N/A 04/17/2014   Procedure: LEFT HEART CATHETERIZATION WITH CORONARY ANGIOGRAM;  Surgeon: Birdie Riddle, MD;  Location:  [facility];  Service: Cardiovascular;  Laterality: N/A;   RADIOLOGY WITH ANESTHESIA N/A 06/11/2015   Procedure: MRI LUMBAR WITH AND WITHOUT CONTRAST;  Surgeon: Medication Radiologist, MD;  Location: [facility];  Service: Radiology;  Laterality: N/A;   SPINE SURGERY N/A    Phreesia 04/14/2020    Social History   Socioeconomic History   Marital status: Married    Spouse name: Delcie Roch    Number of children: 2   Years of education: Not on file   Highest education level: Not on file  Occupational History   Occupation: Disabled  Tobacco Use   Smoking status: Former    Packs/day: 0.50    Years: 27.00    Total pack years: 13.50    Types: Cigarettes    Quit date: 03/11/2015    Years since quitting: 6.9   Smokeless tobacco: Former  Scientific laboratory technician Use: Never used  Substance and Sexual Activity   Alcohol use: Yes   Drug use: No    Comment: 04/16/2014 "in my teens"   Sexual activity: Yes  Other Topics Concern   Not on file  Social History Narrative   Drinks 2 cups of coffee in the morning.   Social Determinants of Health   Financial Resource Strain: Not on file  Food Insecurity: Not on file  Transportation Needs: Not on file  Physical Activity: Not on file  Stress: Not on file  Social Connections: Not on file  Intimate Partner Violence: Not on file    Family History  Problem Relation Age of Onset   Multiple sclerosis Mother    Obesity Mother    Heart failure Maternal Grandfather    Diabetes Father    Hyperlipidemia Father    Sleep apnea Father    Obesity Father    Fibromyalgia Sister    Schizophrenia Brother    Stroke Maternal Grandmother    Diabetes Paternal Grandmother      Review of Systems  Constitutional: Negative.  Negative for chills and fever.  HENT: Negative.  Negative for congestion and sore throat.   Respiratory: Negative.  Negative for cough and shortness of breath.   Cardiovascular: Negative.  Negative for chest pain and palpitations.   Gastrointestinal:  Negative for nausea and vomiting.  Skin: Negative.  Negative for rash.  Neurological:  Negative for dizziness and headaches.  All other systems reviewed and are negative.  Today's Vitals   03/03/22 1442  BP: 122/74  Pulse: 78  Temp: 98.4 F (36.9 C)  TempSrc: Oral  SpO2: 98%  Weight: 220 lb 6 oz (100 kg)  Height: 5\' 8"  (1.727 m)   Body mass index is 33.51 kg/m. Wt Readings from Last 3 Encounters:  03/03/22 220 lb 6 oz (100 kg)  12/23/21 231 lb 6.4 oz (105 kg)  09/30/21 266 lb 8 oz (120.9 kg)  Physical Exam Vitals reviewed.  Constitutional:      Appearance: Normal appearance.  HENT:     Head: Normocephalic.  Eyes:     Extraocular Movements: Extraocular movements intact.  Cardiovascular:     Rate and Rhythm: Normal rate.  Pulmonary:     Effort: Pulmonary effort is normal.  Genitourinary:    Rectum: Mass (Tender and fluctuant mass at 10 o'clock) present.  Musculoskeletal:     Comments: Big toes: Left big toe with some distal swelling and mild redness.  May be infected.  Skin:    General: Skin is warm and dry.  Neurological:     Mental Status: He is alert and oriented to person, place, and time.  Psychiatric:        Mood and Affect: Mood normal.        Behavior: Behavior normal.      ASSESSMENT & PLAN: Problem List Items Addressed This Visit       Musculoskeletal and Integument   Ingrown toenail    Healing well but may have early infection of the left big toe. Start hydroxyzine 500 mg twice a day for 7 days.      Relevant Medications   cefadroxil (DURICEF) 500 MG capsule     Other   Perianal abscess - Primary    We will start antibiotics Cefadroxil 500 mg twice a day Needs evaluation by colorectal surgeon ASAP Will need incision and drainage May need to go back to urgent care center for procedure      Relevant Medications   cefadroxil (DURICEF) 500 MG capsule   Other Relevant Orders   Ambulatory referral to Colorectal  Surgery   Patient Instructions  Anorectal Abscess An abscess is an infected area that contains a collection of pus. An anorectal abscess is an abscess that is near the opening of the anus or around the rectum. Without treatment, an anorectal abscess can become larger and cause other problems, such as a more serious body-wide infection or pain, especially during bowel movements. What are the causes? This condition is caused by plugged glands or an infection in one of these areas: The anus. The area between the anus and the scrotum in males or between the anus and the vagina in females (perineum). What increases the risk? The following factors may make you more likely to develop this condition: Diabetes or inflammatory bowel disease. Having a body defense system (immune system) that is weak. Engaging in anal sex. Having a sexually transmitted infection (STI). Certain kinds of cancer, such as rectal carcinoma, leukemia, or lymphoma. What are the signs or symptoms? The main symptom of this condition is pain. The pain may be a throbbing pain that gets worse during bowel movements. Other symptoms include: Swelling and redness in the area of the abscess. The redness may go beyond the abscess and appear as a red streak on the skin. A visible, painful lump, or a lump that can be felt when touched. Bleeding or pus-like discharge from the area. Fever. General weakness. Constipation. Diarrhea. How is this diagnosed? This condition is diagnosed based on your medical history and a physical exam of the affected area. This may involve examining the rectal area with a gloved hand (digital rectal exam). Sometimes, the health care provider needs to look into the rectum using a probe, scope, or imaging test. For women, it may require a careful vaginal exam. How is this treated? Treatment for this condition may include: Incision and drainage surgery. This involves making an  incision over the abscess to  drain the pus. Medicines, including antibiotic medicine, pain medicine, stool softeners, or laxatives. Follow these instructions at home: Medicines Take over-the-counter and prescription medicines only as told by your health care provider. If you were prescribed an antibiotic medicine, use it as told by your health care provider. Do not stop using the antibiotic even if you start to feel better. Do not drive or use heavy machinery while taking prescription pain medicine. Wound care  If gauze was used in the abscess, follow instructions from your health care provider about removing or changing the gauze. It can usually be removed in 2-3 days. Wash your hands with soap and water before you remove or change your gauze. If soap and water are not available, use hand sanitizer. If one or more drains were placed in the abscess cavity, be careful not to pull at them. Your health care provider will tell you how long they need to remain in place. Check your incision area every day for signs of infection. Check for: More redness, swelling, or pain. More fluid or blood. Warmth. Pus or a bad smell. Managing pain, stiffness, and swelling  Take a sitz bath 3-4 times a day and after bowel movements. This will help reduce pain and swelling. To relieve pain, try sitting: On a heating pad with the setting on low. On an inflatable donut-shaped cushion. If directed, put ice on the affected area: Put ice in a plastic bag. Place a towel between your skin and the bag. Leave the ice on for 20 minutes, 2-3 times a day. General instructions Follow any diet instructions given by your health care provider. Keep all follow-up visits as told by your health care provider. This is important. Contact a health care provider if you have: Bleeding from your incision. Pain, swelling, or redness that does not improve or gets worse. Trouble passing stool or urine. Symptoms that return after treatment. Get help right away  if you: Have problems moving or using your legs. Have severe or increasing pain. Have swelling in the affected area that suddenly gets worse. Have a large increase in bleeding or passing of pus. Develop chills or a fever. Summary An anorectal abscess is an abscess that is near the opening of the anus or around the rectum. An abscess is an infected area that contains a collection of pus. The main symptom of this condition is pain. It may be a throbbing pain that gets worse during bowel movements. Treatment for an anorectal abscess may include surgery to drain the pus from the abscess. Medicines and sitz baths may also be a part of your treatment plan. This information is not intended to replace advice given to you by your health care provider. Make sure you discuss any questions you have with your health care provider. Document Revised: 09/30/2021 Document Reviewed: 12/03/2020 Elsevier Patient Education  [place], MD [facility]

## 2022-03-03 NOTE — Patient Instructions (Signed)
Anorectal Abscess An abscess is an infected area that contains a collection of pus. An anorectal abscess is an abscess that is near the opening of the anus or around the rectum. Without treatment, an anorectal abscess can become larger and cause other problems, such as a more serious body-wide infection or pain, especially during bowel movements. What are the causes? This condition is caused by plugged glands or an infection in one of these areas: The anus. The area between the anus and the scrotum in males or between the anus and the vagina in females (perineum). What increases the risk? The following factors may make you more likely to develop this condition: Diabetes or inflammatory bowel disease. Having a body defense system (immune system) that is weak. Engaging in anal sex. Having a sexually transmitted infection (STI). Certain kinds of cancer, such as rectal carcinoma, leukemia, or lymphoma. What are the signs or symptoms? The main symptom of this condition is pain. The pain may be a throbbing pain that gets worse during bowel movements. Other symptoms include: Swelling and redness in the area of the abscess. The redness may go beyond the abscess and appear as a red streak on the skin. A visible, painful lump, or a lump that can be felt when touched. Bleeding or pus-like discharge from the area. Fever. General weakness. Constipation. Diarrhea. How is this diagnosed? This condition is diagnosed based on your medical history and a physical exam of the affected area. This may involve examining the rectal area with a gloved hand (digital rectal exam). Sometimes, the health care provider needs to look into the rectum using a probe, scope, or imaging test. For women, it may require a careful vaginal exam. How is this treated? Treatment for this condition may include: Incision and drainage surgery. This involves making an incision over the abscess to drain the pus. Medicines, including  antibiotic medicine, pain medicine, stool softeners, or laxatives. Follow these instructions at home: Medicines Take over-the-counter and prescription medicines only as told by your health care provider. If you were prescribed an antibiotic medicine, use it as told by your health care provider. Do not stop using the antibiotic even if you start to feel better. Do not drive or use heavy machinery while taking prescription pain medicine. Wound care  If gauze was used in the abscess, follow instructions from your health care provider about removing or changing the gauze. It can usually be removed in 2-3 days. Wash your hands with soap and water before you remove or change your gauze. If soap and water are not available, use hand sanitizer. If one or more drains were placed in the abscess cavity, be careful not to pull at them. Your health care provider will tell you how long they need to remain in place. Check your incision area every day for signs of infection. Check for: More redness, swelling, or pain. More fluid or blood. Warmth. Pus or a bad smell. Managing pain, stiffness, and swelling  Take a sitz bath 3-4 times a day and after bowel movements. This will help reduce pain and swelling. To relieve pain, try sitting: On a heating pad with the setting on low. On an inflatable donut-shaped cushion. If directed, put ice on the affected area: Put ice in a plastic bag. Place a towel between your skin and the bag. Leave the ice on for 20 minutes, 2-3 times a day. General instructions Follow any diet instructions given by your health care provider. Keep all follow-up visits as told   by your health care provider. This is important. Contact a health care provider if you have: Bleeding from your incision. Pain, swelling, or redness that does not improve or gets worse. Trouble passing stool or urine. Symptoms that return after treatment. Get help right away if you: Have problems moving or  using your legs. Have severe or increasing pain. Have swelling in the affected area that suddenly gets worse. Have a large increase in bleeding or passing of pus. Develop chills or a fever. Summary An anorectal abscess is an abscess that is near the opening of the anus or around the rectum. An abscess is an infected area that contains a collection of pus. The main symptom of this condition is pain. It may be a throbbing pain that gets worse during bowel movements. Treatment for an anorectal abscess may include surgery to drain the pus from the abscess. Medicines and sitz baths may also be a part of your treatment plan. This information is not intended to replace advice given to you by your health care provider. Make sure you discuss any questions you have with your health care provider. Document Revised: 09/30/2021 Document Reviewed: 12/03/2020 Elsevier Patient Education  2023 [employer]  

## 2022-03-04 ENCOUNTER — Ambulatory Visit (INDEPENDENT_AMBULATORY_CARE_PROVIDER_SITE_OTHER): Payer: Medicare Other

## 2022-03-04 VITALS — Ht 68.0 in | Wt 220.0 lb

## 2022-03-04 DIAGNOSIS — Z0000 Encounter for general adult medical examination without abnormal findings: Secondary | ICD-10-CM

## 2022-03-04 NOTE — Addendum Note (Signed)
Addended by: Sheral Flow on: 03/04/2022 10:11 AM   Modules accepted: Level of Service

## 2022-03-04 NOTE — Patient Instructions (Addendum)
Mr. Perrow , Thank you for taking time to come for your Medicare Wellness Visit. I appreciate your ongoing commitment to your health goals. Please review the following plan we discussed and let me know if I can assist you in the future.   These are the goals we discussed:  Goals      My goal is lose another 30-40 pounds.  Increase my walking.        This is a list of the screening recommended for you and due dates:  Health Maintenance  Topic Date Due   COVID-19 Vaccine (1) Never done   Flu Shot  08/29/2022*   Tetanus Vaccine  06/11/2022   Hepatitis C Screening: USPSTF Recommendation to screen - Ages 18-79 yo.  Completed   HIV Screening  Completed   HPV Vaccine  Aged Out  *Topic was postponed. The date shown is not the original due date.    Advanced directives: No  Conditions/risks identified: Yes  Next appointment: Follow up in one year for your annual wellness visit on 03/07/2023 at 8:45 a.m. telephone visit with Percell Miller, Nurse Health Advisor.  If you need to reschedule or cancel, please call [phone].  Preventive Care 40-64 Years, Male Preventive care refers to lifestyle choices and visits with your health care provider that can promote health and wellness. What does preventive care include? A yearly physical exam. This is also called an annual well check. Dental exams once or twice a year. Routine eye exams. Ask your health care provider how often you should have your eyes checked. Personal lifestyle choices, including: Daily care of your teeth and gums. Regular physical activity. Eating a healthy diet. Avoiding tobacco and drug use. Limiting alcohol use. Practicing safe sex. Taking low-dose aspirin every day starting at age 49. What happens during an annual well check? The services and screenings done by your health care provider during your annual well check will depend on your age, overall health, lifestyle risk factors, and family history of  disease. Counseling  Your health care provider may ask you questions about your: Alcohol use. Tobacco use. Drug use. Emotional well-being. Home and relationship well-being. Sexual activity. Eating habits. Work and work Astron[employer]. Screening  You may have the following tests or measurements: Height, weight, and BMI. Blood pressure. Lipid and cholesterol levels. These may be checked every 5 years, or more frequently if you are over 61 years old. Skin check. Lung cancer screening. You may have this screening every year starting at age 48 if you have a 30-pack-year history of smoking and currently smoke or have quit within the past 15 years. Fecal occult blood test (FOBT) of the stool. You may have this test every year starting at age 73. Flexible sigmoidoscopy or colonoscopy. You may have a sigmoidoscopy every 5 years or a colonoscopy every 10 years starting at age 68. Prostate cancer screening. Recommendations will vary depending on your family history and other risks. Hepatitis C blood test. Hepatitis B blood test. Sexually transmitted disease (STD) testing. Diabetes screening. This is done by checking your blood sugar (glucose) after you have not eaten for a while (fasting). You may have this done every 1-3 years. Discuss your test results, treatment options, and if necessary, the need for more tests with your health care provider. Vaccines  Your health care provider may recommend certain vaccines, such as: Influenza vaccine. This is recommended every year. Tetanus, diphtheria, and acellular pertussis (Tdap, Td) vaccine. You may need a Td booster every 10 years. Zoster vaccine.  You may need this after age 46. Pneumococcal 13-valent conjugate (PCV13) vaccine. You may need this if you have certain conditions and have not been vaccinated. Pneumococcal polysaccharide (PPSV23) vaccine. You may need one or two doses if you smoke cigarettes or if you have certain conditions. Talk to your  health care provider about which screenings and vaccines you need and how often you need them. This information is not intended to replace advice given to you by your health care provider. Make sure you discuss any questions you have with your health care provider. Document Released: 06/13/2015 Document Revised: 02/04/2016 Document Reviewed: 03/18/2015 Elsevier Interactive Patient Education  2017 [employer] Prevention in the Home Falls can cause injuries. They can happen to people of all ages. There are many things you can do to make your home safe and to help prevent falls. What can I do on the outside of my home? Regularly fix the edges of walkways and driveways and fix any cracks. Remove anything that might make you trip as you walk through a door, such as a raised step or threshold. Trim any bushes or trees on the path to your home. Use bright outdoor lighting. Clear any walking paths of anything that might make someone trip, such as rocks or tools. Regularly check to see if handrails are loose or broken. Make sure that both sides of any steps have handrails. Any raised decks and porches should have guardrails on the edges. Have any leaves, snow, or ice cleared regularly. Use sand or salt on walking paths during winter. Clean up any spills in your garage right away. This includes oil or grease spills. What can I do in the bathroom? Use night lights. Install grab bars by the toilet and in the tub and shower. Do not use towel bars as grab bars. Use non-skid mats or decals in the tub or shower. If you need to sit down in the shower, use a plastic, non-slip stool. Keep the floor dry. Clean up any water that spills on the floor as soon as it happens. Remove soap buildup in the tub or shower regularly. Attach bath mats securely with double-sided non-slip rug tape. Do not have throw rugs and other things on the floor that can make you trip. What can I do in the bedroom? Use night  lights. Make sure that you have a light by your bed that is easy to reach. Do not use any sheets or blankets that are too big for your bed. They should not hang down onto the floor. Have a firm chair that has side arms. You can use this for support while you get dressed. Do not have throw rugs and other things on the floor that can make you trip. What can I do in the kitchen? Clean up any spills right away. Avoid walking on wet floors. Keep items that you use a lot in easy-to-reach places. If you need to reach something above you, use a strong step stool that has a grab bar. Keep electrical cords out of the way. Do not use floor polish or wax that makes floors slippery. If you must use wax, use non-skid floor wax. Do not have throw rugs and other things on the floor that can make you trip. What can I do with my stairs? Do not leave any items on the stairs. Make sure that there are handrails on both sides of the stairs and use them. Fix handrails that are broken or loose. Make sure  that handrails are as long as the stairways. Check any carpeting to make sure that it is firmly attached to the stairs. Fix any carpet that is loose or worn. Avoid having throw rugs at the top or bottom of the stairs. If you do have throw rugs, attach them to the floor with carpet tape. Make sure that you have a light switch at the top of the stairs and the bottom of the stairs. If you do not have them, ask someone to add them for you. What else can I do to help prevent falls? Wear shoes that: Do not have high heels. Have rubber bottoms. Are comfortable and fit you well. Are closed at the toe. Do not wear sandals. If you use a stepladder: Make sure that it is fully opened. Do not climb a closed stepladder. Make sure that both sides of the stepladder are locked into place. Ask someone to hold it for you, if possible. Clearly mark and make sure that you can see: Any grab bars or handrails. First and last  steps. Where the edge of each step is. Use tools that help you move around (mobility aids) if they are needed. These include: Canes. Walkers. Scooters. Crutches. Turn on the lights when you go into a dark area. Replace any light bulbs as soon as they burn out. Set up your furniture so you have a clear path. Avoid moving your furniture around. If any of your floors are uneven, fix them. If there are any pets around you, be aware of where they are. Review your medicines with your doctor. Some medicines can make you feel dizzy. This can increase your chance of falling. Ask your doctor what other things that you can do to help prevent falls. This information is not intended to replace advice given to you by your health care provider. Make sure you discuss any questions you have with your health care provider. Document Released: 03/13/2009 Document Revised: 10/23/2015 Document Reviewed: 06/21/2014 Elsevier Interactive Patient Education  2017 [employer].

## 2022-03-04 NOTE — Progress Notes (Signed)
Virtual Visit via Telephone Note  I connected with  Bruce Morales on 03/04/22 at  8:45 AM EDT by telephone and verified that I am speaking with the correct person using two identifiers.  Location: Patient: Home Provider: [place] Persons participating in the virtual visit: [place]   I discussed the limitations, risks, security and privacy concerns of performing an evaluation and management service by telephone and the availability of in person appointments. The patient expressed understanding and agreed to proceed.  Interactive audio and video telecommunications were attempted between this nurse and patient, however failed, due to patient having technical difficulties OR patient did not have access to video capability.  We continued and completed visit with audio only.  Some vital signs may be absent or patient reported.   Sheral Flow, LPN  Subjective:   Bruce Morales is a 46 y.o. male who presents for Medicare Annual/Subsequent preventive examination.  Review of Systems     Cardiac Risk Factors include: dyslipidemia;male gender;obesity (BMI >30kg/m2);smoking/ tobacco exposure     Objective:    Today's Vitals   03/04/22 0903  Weight: 220 lb (99.8 kg)  Height: 5\' 8"  (1.727 m)   Body mass index is 33.45 kg/m.     03/04/2022    8:50 AM 08/16/2021    8:36 AM 07/22/2016   10:24 PM 06/24/2015    9:45 AM 06/05/2015    5:02 AM 06/04/2015    7:44 PM 04/21/2015    4:51 PM  Advanced Directives  Does Patient Have a Medical Advance Directive? No No No No No No No  Would patient like information on creating a medical advance directive? No - Patient declined No - Patient declined  No - patient declined information No - patient declined information      Current Medications (verified) Outpatient Encounter Medications as of 03/04/2022  Medication Sig   ascorbic acid (VITAMIN C) 1000 MG tablet Take by mouth.   cefadroxil (DURICEF) 500 MG  capsule Take 1 capsule (500 mg total) by mouth 2 (two) times daily for 7 days.   CLOMID 50 MG tablet Take 25 mg by mouth daily. (Patient not taking: Reported on 03/03/2022)   diltiazem (CARDIZEM) 30 MG tablet TAKE 1 TABLET EVERY DAY AS NEEDED FOR RECTAL SPASM PAINS (Patient taking differently: Take 30 mg by mouth daily. TAKE 1 TABLET EVERY DAY)   GREEN TEA, CAMELLIA SINENSIS, PO Take by mouth daily in the afternoon.   HYDROmorphone (DILAUDID) 4 MG tablet Take 4 mg by mouth every 6 (six) hours as needed.   rosuvastatin (CRESTOR) 10 MG tablet Take 1 tablet (10 mg total) by mouth daily. (Patient not taking: Reported on 03/03/2022)   Semaglutide,0.25 or 0.5MG /DOS, (OZEMPIC, 0.25 OR 0.5 MG/DOSE,) 2 MG/1.5ML SOPN See admin instructions.   tiZANidine (ZANAFLEX) 4 MG tablet Take 1 tablet (4 mg total) by mouth every 8 (eight) hours as needed for muscle spasms.   No facility-administered encounter medications on file as of 03/04/2022.    Allergies (verified) Other, Hydrocodone, Oxycodone, and Percocet [oxycodone-acetaminophen]   History: Past Medical History:  Diagnosis Date   Anginal pain ([place]) 04/15/2014   Arthritis    "wrists, knees, lower back" (04/16/2014)   Carpal tunnel syndrome    Chronic bronchitis ([place])    "probably get it q yr"   Chronic lower back pain    Chronic lower back pain    Constipation    Gall stones    GERD (gastroesophageal reflux disease)  GERD (gastroesophageal reflux disease)    HLD (hyperlipidemia)    Hyperlipidemia    Phreesia 04/14/2020   Joint pain    Meningitis    Plantar fasciitis    Right knee pain    Sciatica    Past Surgical History:  Procedure Laterality Date   CARPAL TUNNEL RELEASE     02/2018 Right arm   CHOLECYSTECTOMY  2011   ESOPHAGOGASTRODUODENOSCOPY N/A 02/27/2013   Procedure: ESOPHAGOGASTRODUODENOSCOPY (EGD);  Surgeon: Florencia Reasons, MD;  Location: [facility];  Service: Endoscopy;  Laterality: N/A;   KNEE ARTHROSCOPY Right 2013 X 2    KNEE ARTHROSCOPY Right 02/22/2018   LAMINECTOMY AND MICRODISCECTOMY SPINE     LEFT HEART CATHETERIZATION WITH CORONARY ANGIOGRAM N/A 04/17/2014   Procedure: LEFT HEART CATHETERIZATION WITH CORONARY ANGIOGRAM;  Surgeon: Ricki Rodriguez, MD;  Location: [facility];  Service: Cardiovascular;  Laterality: N/A;   RADIOLOGY WITH ANESTHESIA N/A 06/11/2015   Procedure: MRI LUMBAR WITH AND WITHOUT CONTRAST;  Surgeon: Medication Radiologist, MD;  Location: [facility] OR;  Service: Radiology;  Laterality: N/A;   SPINE SURGERY N/A    Phreesia 04/14/2020   Family History  Problem Relation Age of Onset   Multiple sclerosis Mother    Obesity Mother    Heart failure Maternal Grandfather    Diabetes Father    Hyperlipidemia Father    Sleep apnea Father    Obesity Father    Fibromyalgia Sister    Schizophrenia Brother    Stroke Maternal Grandmother    Diabetes Paternal Grandmother    Social History   Socioeconomic History   Marital status: Married    Spouse name: Janelle Floor    Number of children: 2   Years of education: Not on file   Highest education level: Not on file  Occupational History   Occupation: Disabled  Tobacco Use   Smoking status: Former    Packs/day: 0.50    Years: 27.00    Total pack years: 13.50    Types: Cigarettes    Quit date: 03/11/2015    Years since quitting: 6.9   Smokeless tobacco: Former  Building services engineer Use: Never used  Substance and Sexual Activity   Alcohol use: Yes   Drug use: No    Comment: 04/16/2014 "in my teens"   Sexual activity: Yes  Other Topics Concern   Not on file  Social History Narrative   Drinks 2 cups of coffee in the morning.   Social Determinants of Health   Financial Resource Strain: Low Risk  (03/04/2022)   Overall Financial Resource Strain (CARDIA)    Difficulty of Paying Living Expenses: Not hard at all  Food Insecurity: No Food Insecurity (03/04/2022)   Hunger Vital Sign    Worried About Running Out of Food in the Last Year: Never true     Ran Out of Food in the Last Year: Never true  Transportation Needs: No Transportation Needs (03/04/2022)   PRAPARE - Administrator, Civil Service (Medical): No    Lack of Transportation (Non-Medical): No  Physical Activity: Sufficiently Active (03/04/2022)   Exercise Vital Sign    Days of Exercise per Week: 5 days    Minutes of Exercise per Session: 30 min  Stress: No Stress Concern Present (03/04/2022)   [employer] of Occupational Health - Occupational Stress Questionnaire    Feeling of Stress : Not at all  Social Connections: Socially Integrated (03/04/2022)   Social Connection and Isolation Panel [NHANES]  Frequency of Communication with Friends and Family: More than three times a week    Frequency of Social Gatherings with Friends and Family: More than three times a week    Attends Religious Services: More than 4 times per year    Active Member of [employer] Financial[employer]ubs or Organizations: Yes    Attends Engineer, structuralClub or Organization Meetings: More than 4 times per year    Marital Status: Married    Tobacco Counseling Counseling given: Not Answered   Clinical Intake:  Pre-visit preparation completed: Yes  Pain : 0-10 Pain Type: Chronic pain Pain Location: Generalized     BMI - recorded: 33.45 Nutritional Status: BMI > 30  Obese Nutritional Risks: None Diabetes: No  How often do you need to have someone help you when you read instructions, pamphlets, or other written materials from your doctor or pharmacy?: 1 - Never What is the last grade level you completed in school?: HSG  Diabetic? no  Interpreter Needed?: No  Information entered by :: Susie CassetteShenika Korra Christine, LPN.   Activities of Daily Living    03/04/2022    9:01 AM  In your present state of health, do you have any difficulty performing the following activities:  Hearing? 1  Vision? 0  Difficulty concentrating or making decisions? 1  Walking or climbing stairs? 1  Dressing or bathing? 0  Doing errands, shopping?  1  Preparing Food and eating ? N  Using the Toilet? N  In the past six months, have you accidently leaked urine? N  Do you have problems with loss of bowel control? N  Managing your Medications? N  Managing your Finances? N  Housekeeping or managing your Housekeeping? Y    Patient Care Team: Georgina QuintSagardia, Miguel Jose, MD as PCP - General (Internal Medicine) Little IshikawaSchumann, Christopher L, MD as PCP - Cardiology (Cardiology)  Indicate any recent Medical Services you may have received from other than [facility] providers in the past year (date may be approximate).     Assessment:   This is a routine wellness examination for Bruce HuaDavid.  Hearing/Vision screen Hearing Screening - Comments:: Patient has difficulty hearing; no hearing aids. Vision Screening - Comments:: No vision coverage.  Dietary issues and exercise activities discussed: Current Exercise Habits: Home exercise routine, Type of exercise: walking, Time (Minutes): 30, Frequency (Times/Week): 5, Weekly Exercise (Minutes/Week): 150, Intensity: Mild, Exercise limited by: orthopedic condition(s);neurologic condition(s)   Goals Addressed             This Visit's Progress    My goal is lose another 30-40 pounds.  Increase my walking.        Depression Screen    03/04/2022    8:51 AM 03/03/2022    2:46 PM 09/30/2021    8:07 AM 04/17/2020    8:04 AM 04/04/2019    8:09 AM 08/09/2018    9:16 AM 06/20/2018   10:59 AM  PHQ 2/9 Scores  PHQ - 2 Score 0 0 0 0 0 0 0    Fall Risk    03/04/2022    8:51 AM 03/03/2022    2:46 PM 09/30/2021    8:07 AM 04/17/2020    8:04 AM 04/04/2019    8:09 AM  Fall Risk   Falls in the past year? 0 0 0 0 1  Number falls in past yr: 0 0  0 0  Injury with Fall? 0 0  0 1  Comment     right foot  Risk for fall due to : No Fall Risks  No Fall Risks  No Fall Risks   Follow up Falls prevention discussed Falls evaluation completed  Falls evaluation completed Falls evaluation completed    FALL RISK PREVENTION PERTAINING  TO THE HOME:  Any stairs in or around the home? No  If so, are there any without handrails? No  Home free of loose throw rugs in walkways, pet beds, electrical cords, etc? Yes  Adequate lighting in your home to reduce risk of falls? Yes   ASSISTIVE DEVICES UTILIZED TO PREVENT FALLS:  Life alert? No  Use of a cane, walker or w/c? No  Grab bars in the bathroom? No  Shower chair or bench in shower? No  Elevated toilet seat or a handicapped toilet? No   TIMED UP AND GO:  Was the test performed? No . Phone Visit   Cognitive Function:        03/04/2022    9:03 AM  6CIT Screen  What Year? 0 points  What month? 0 points  What time? 0 points  Count back from 20 0 points  Months in reverse 0 points  Repeat phrase 0 points  Total Score 0 points    Immunizations Immunization History  Administered Date(s) Administered   Tdap 06/11/2012    TDAP status: Up to date  Flu Vaccine status: Declined, Education has been provided regarding the importance of this vaccine but patient still declined. Advised may receive this vaccine at local pharmacy or Health Dept. Aware to provide a copy of the vaccination record if obtained from local pharmacy or Health Dept. Verbalized acceptance and understanding.  Pneumococcal vaccine status: Declined,  Education has been provided regarding the importance of this vaccine but patient still declined. Advised may receive this vaccine at local pharmacy or Health Dept. Aware to provide a copy of the vaccination record if obtained from local pharmacy or Health Dept. Verbalized acceptance and understanding.   Covid-19 vaccine status: Declined, Education has been provided regarding the importance of this vaccine but patient still declined. Advised may receive this vaccine at local pharmacy or Health Dept.or vaccine clinic. Aware to provide a copy of the vaccination record if obtained from local pharmacy or Health Dept. Verbalized acceptance and  understanding.  Qualifies for Shingles Vaccine? No   Zostavax completed No   Shingrix Completed?: No.    Education has been provided regarding the importance of this vaccine. Patient has been advised to call insurance company to determine out of pocket expense if they have not yet received this vaccine. Advised may also receive vaccine at local pharmacy or Health Dept. Verbalized acceptance and understanding.  Screening Tests Health Maintenance  Topic Date Due   COVID-19 Vaccine (1) Never done   INFLUENZA VACCINE  08/29/2022 (Originally 12/29/2021)   TETANUS/TDAP  06/11/2022   Hepatitis C Screening  Completed   HIV Screening  Completed   HPV VACCINES  Aged Out    Health Maintenance  Health Maintenance Due  Topic Date Due   COVID-19 Vaccine (1) Never done    Colorectal Cancer Screening: Never done  Lung Cancer Screening: (Low Dose CT Chest recommended if Age 41-80 years, 30 pack-year currently smoking OR have quit w/in 15years.) does not qualify.   Lung Cancer Screening Referral: no  Additional Screening:  Hepatitis C Screening: does qualify; Completed 12/08/2017  Vision Screening: Recommended annual ophthalmology exams for early detection of glaucoma and other disorders of the eye. Is the patient up to date with their annual eye exam?  No ; no vision coverage Who  is the provider or what is the name of the office in which the patient attends annual eye exams? None If pt is not established with a provider, would they like to be referred to a provider to establish care? No .   Dental Screening: Recommended annual dental exams for proper oral hygiene  Community Resource Referral / Chronic Care Management: CRR required this visit?  No   CCM required this visit?  No      Plan:     I have personally reviewed and noted the following in the patient's chart:   Medical and social history Use of alcohol, tobacco or illicit drugs  Current medications and supplements including  opioid prescriptions. Patient is not currently taking opioid prescriptions. Functional ability and status Nutritional status Physical activity Advanced directives List of other physicians Hospitalizations, surgeries, and ER visits in previous 12 months Vitals Screenings to include cognitive, depression, and falls Referrals and appointments  In addition, I have reviewed and discussed with patient certain preventive protocols, quality metrics, and best practice recommendations. A written personalized care plan for preventive services as well as general preventive health recommendations were provided to patient.     Mickeal Needy, LPN   24/09/8097   Nurse Notes: N/A

## 2022-03-04 NOTE — Progress Notes (Deleted)
Virtual Visit via Telephone Note  I connected with  Bruce Morales on 03/04/22 at  8:45 AM EDT by telephone and verified that I am speaking with the correct person using two identifiers.  Location: Patient: Home Provider: [place] Persons participating in the virtual visit: [place]   I discussed the limitations, risks, security and privacy concerns of performing an evaluation and management service by telephone and the availability of in person appointments. The patient expressed understanding and agreed to proceed.  Interactive audio and video telecommunications were attempted between this nurse and patient, however failed, due to patient having technical difficulties OR patient did not have access to video capability.  We continued and completed visit with audio only.  Some vital signs may be absent or patient reported.   Sheral Flow, LPN  Subjective:   Bruce Morales is a 46 y.o. male who presents for Medicare Annual/Subsequent preventive examination.  Review of Systems     Cardiac Risk Factors include: dyslipidemia;male gender;obesity (BMI >30kg/m2);smoking/ tobacco exposure     Objective:    Today's Vitals   03/04/22 0903  Weight: 220 lb (99.8 kg)  Height: 5\' 8"  (1.727 m)   Body mass index is 33.45 kg/m.     03/04/2022    8:50 AM 08/16/2021    8:36 AM 07/22/2016   10:24 PM 06/24/2015    9:45 AM 06/05/2015    5:02 AM 06/04/2015    7:44 PM 04/21/2015    4:51 PM  Advanced Directives  Does Patient Have a Medical Advance Directive? No No No No No No No  Would patient like information on creating a medical advance directive? No - Patient declined No - Patient declined  No - patient declined information No - patient declined information      Current Medications (verified) Outpatient Encounter Medications as of 03/04/2022  Medication Sig   ascorbic acid (VITAMIN C) 1000 MG tablet Take by mouth.   cefadroxil (DURICEF) 500 MG  capsule Take 1 capsule (500 mg total) by mouth 2 (two) times daily for 7 days.   CLOMID 50 MG tablet Take 25 mg by mouth daily. (Patient not taking: Reported on 03/03/2022)   diltiazem (CARDIZEM) 30 MG tablet TAKE 1 TABLET EVERY DAY AS NEEDED FOR RECTAL SPASM PAINS (Patient taking differently: Take 30 mg by mouth daily. TAKE 1 TABLET EVERY DAY)   GREEN TEA, CAMELLIA SINENSIS, PO Take by mouth daily in the afternoon.   HYDROmorphone (DILAUDID) 4 MG tablet Take 4 mg by mouth every 6 (six) hours as needed.   rosuvastatin (CRESTOR) 10 MG tablet Take 1 tablet (10 mg total) by mouth daily. (Patient not taking: Reported on 03/03/2022)   Semaglutide,0.25 or 0.5MG /DOS, (OZEMPIC, 0.25 OR 0.5 MG/DOSE,) 2 MG/1.5ML SOPN See admin instructions.   tiZANidine (ZANAFLEX) 4 MG tablet Take 1 tablet (4 mg total) by mouth every 8 (eight) hours as needed for muscle spasms.   No facility-administered encounter medications on file as of 03/04/2022.    Allergies (verified) Other, Hydrocodone, Oxycodone, and Percocet [oxycodone-acetaminophen]   History: Past Medical History:  Diagnosis Date   Anginal pain ([place]) 04/15/2014   Arthritis    "wrists, knees, lower back" (04/16/2014)   Carpal tunnel syndrome    Chronic bronchitis ([place])    "probably get it q yr"   Chronic lower back pain    Chronic lower back pain    Constipation    Gall stones    GERD (gastroesophageal reflux disease)  GERD (gastroesophageal reflux disease)    HLD (hyperlipidemia)    Hyperlipidemia    Phreesia 04/14/2020   Joint pain    Meningitis    Plantar fasciitis    Right knee pain    Sciatica    Past Surgical History:  Procedure Laterality Date   CARPAL TUNNEL RELEASE     02/2018 Right arm   CHOLECYSTECTOMY  2011   ESOPHAGOGASTRODUODENOSCOPY N/A 02/27/2013   Procedure: ESOPHAGOGASTRODUODENOSCOPY (EGD);  Surgeon: Florencia Reasons, MD;  Location: [facility];  Service: Endoscopy;  Laterality: N/A;   KNEE ARTHROSCOPY Right 2013 X 2    KNEE ARTHROSCOPY Right 02/22/2018   LAMINECTOMY AND MICRODISCECTOMY SPINE     LEFT HEART CATHETERIZATION WITH CORONARY ANGIOGRAM N/A 04/17/2014   Procedure: LEFT HEART CATHETERIZATION WITH CORONARY ANGIOGRAM;  Surgeon: Ricki Rodriguez, MD;  Location: [facility];  Service: Cardiovascular;  Laterality: N/A;   RADIOLOGY WITH ANESTHESIA N/A 06/11/2015   Procedure: MRI LUMBAR WITH AND WITHOUT CONTRAST;  Surgeon: Medication Radiologist, MD;  Location: [facility] OR;  Service: Radiology;  Laterality: N/A;   SPINE SURGERY N/A    Phreesia 04/14/2020   Family History  Problem Relation Age of Onset   Multiple sclerosis Mother    Obesity Mother    Heart failure Maternal Grandfather    Diabetes Father    Hyperlipidemia Father    Sleep apnea Father    Obesity Father    Fibromyalgia Sister    Schizophrenia Brother    Stroke Maternal Grandmother    Diabetes Paternal Grandmother    Social History   Socioeconomic History   Marital status: Married    Spouse name: Janelle Floor    Number of children: 2   Years of education: Not on file   Highest education level: Not on file  Occupational History   Occupation: Disabled  Tobacco Use   Smoking status: Former    Packs/day: 0.50    Years: 27.00    Total pack years: 13.50    Types: Cigarettes    Quit date: 03/11/2015    Years since quitting: 6.9   Smokeless tobacco: Former  Building services engineer Use: Never used  Substance and Sexual Activity   Alcohol use: Yes   Drug use: No    Comment: 04/16/2014 "in my teens"   Sexual activity: Yes  Other Topics Concern   Not on file  Social History Narrative   Drinks 2 cups of coffee in the morning.   Social Determinants of Health   Financial Resource Strain: Low Risk  (03/04/2022)   Overall Financial Resource Strain (CARDIA)    Difficulty of Paying Living Expenses: Not hard at all  Food Insecurity: No Food Insecurity (03/04/2022)   Hunger Vital Sign    Worried About Running Out of Food in the Last Year: Never true     Ran Out of Food in the Last Year: Never true  Transportation Needs: No Transportation Needs (03/04/2022)   PRAPARE - Administrator, Civil Service (Medical): No    Lack of Transportation (Non-Medical): No  Physical Activity: Sufficiently Active (03/04/2022)   Exercise Vital Sign    Days of Exercise per Week: 5 days    Minutes of Exercise per Session: 30 min  Stress: No Stress Concern Present (03/04/2022)   [employer] of Occupational Health - Occupational Stress Questionnaire    Feeling of Stress : Not at all  Social Connections: Socially Integrated (03/04/2022)   Social Connection and Isolation Panel [NHANES]  Frequency of Communication with Friends and Family: More than three times a week    Frequency of Social Gatherings with Friends and Family: More than three times a week    Attends Religious Services: More than 4 times per year    Active Member of Clubs or Organizations: Yes    Attends Club or Organization Meetings: More than 4 times per year    Marital Status: Married    Tobacco Counseling Counseling given: Not Answered   Clinical Intake:  Pre-visit preparation completed: Yes  Pain : 0-10 Pain Type: Chronic pain Pain Location: Generalized     BMI - recorded: 33.45 Nutritional Status: BMI > 30  Obese Nutritional Risks: None Diabetes: No  How often do you need to have someone help you when you read instructions, pamphlets, or other written materials from your doctor or pharmacy?: 1 - Never What is the last grade level you completed in school?: HSG  Diabetic? no  Interpreter Needed?: No  Information entered by :: Jancie Kercher, LPN.   Activities of Daily Living    03/04/2022    9:01 AM  In your present state of health, do you have any difficulty performing the following activities:  Hearing? 1  Vision? 0  Difficulty concentrating or making decisions? 1  Walking or climbing stairs? 1  Dressing or bathing? 0  Doing errands, shopping?  1  Preparing Food and eating ? N  Using the Toilet? N  In the past six months, have you accidently leaked urine? N  Do you have problems with loss of bowel control? N  Managing your Medications? N  Managing your Finances? N  Housekeeping or managing your Housekeeping? Y    Patient Care Team: Sagardia, Miguel Jose, MD as PCP - General (Internal Medicine) Schumann, Christopher L, MD as PCP - Cardiology (Cardiology)  Indicate any recent Medical Services you may have received from other than [facility] providers in the past year (date may be approximate).     Assessment:   This is a routine wellness examination for Bruce Morales.  Hearing/Vision screen Hearing Screening - Comments:: Patient has difficulty hearing; no hearing aids. Vision Screening - Comments:: No vision coverage.  Dietary issues and exercise activities discussed: Current Exercise Habits: Home exercise routine, Type of exercise: walking, Time (Minutes): 30, Frequency (Times/Week): 5, Weekly Exercise (Minutes/Week): 150, Intensity: Mild, Exercise limited by: orthopedic condition(s);neurologic condition(s)   Goals Addressed             This Visit's Progress    My goal is lose another 30-40 pounds.  Increase my walking.        Depression Screen    03/04/2022    8:51 AM 03/03/2022    2:46 PM 09/30/2021    8:07 AM 04/17/2020    8:04 AM 04/04/2019    8:09 AM 08/09/2018    9:16 AM 06/20/2018   10:59 AM  PHQ 2/9 Scores  PHQ - 2 Score 0 0 0 0 0 0 0    Fall Risk    03/04/2022    8:51 AM 03/03/2022    2:46 PM 09/30/2021    8:07 AM 04/17/2020    8:04 AM 04/04/2019    8:09 AM  Fall Risk   Falls in the past year? 0 0 0 0 1  Number falls in past yr: 0 0  0 0  Injury with Fall? 0 0  0 1  Comment     right foot  Risk for fall due to : No Fall Risks   No Fall Risks  No Fall Risks   Follow up Falls prevention discussed Falls evaluation completed  Falls evaluation completed Falls evaluation completed    FALL RISK PREVENTION PERTAINING  TO THE HOME:  Any stairs in or around the home? No  If so, are there any without handrails? No  Home free of loose throw rugs in walkways, pet beds, electrical cords, etc? Yes  Adequate lighting in your home to reduce risk of falls? Yes   ASSISTIVE DEVICES UTILIZED TO PREVENT FALLS:  Life alert? No  Use of a cane, walker or w/c? No  Grab bars in the bathroom? No  Shower chair or bench in shower? No  Elevated toilet seat or a handicapped toilet? No   TIMED UP AND GO:  Was the test performed? No . Phone Visit   Cognitive Function:        03/04/2022    9:03 AM  6CIT Screen  What Year? 0 points  What month? 0 points  What time? 0 points  Count back from 20 0 points  Months in reverse 0 points  Repeat phrase 0 points  Total Score 0 points    Immunizations Immunization History  Administered Date(s) Administered   Tdap 06/11/2012    TDAP status: Up to date  Flu Vaccine status: Declined, Education has been provided regarding the importance of this vaccine but patient still declined. Advised may receive this vaccine at local pharmacy or Health Dept. Aware to provide a copy of the vaccination record if obtained from local pharmacy or Health Dept. Verbalized acceptance and understanding.  Pneumococcal vaccine status: Declined,  Education has been provided regarding the importance of this vaccine but patient still declined. Advised may receive this vaccine at local pharmacy or Health Dept. Aware to provide a copy of the vaccination record if obtained from local pharmacy or Health Dept. Verbalized acceptance and understanding.   Covid-19 vaccine status: Declined, Education has been provided regarding the importance of this vaccine but patient still declined. Advised may receive this vaccine at local pharmacy or Health Dept.or vaccine clinic. Aware to provide a copy of the vaccination record if obtained from local pharmacy or Health Dept. Verbalized acceptance and  understanding.  Qualifies for Shingles Vaccine? No   Zostavax completed No   Shingrix Completed?: No.    Education has been provided regarding the importance of this vaccine. Patient has been advised to call insurance company to determine out of pocket expense if they have not yet received this vaccine. Advised may also receive vaccine at local pharmacy or Health Dept. Verbalized acceptance and understanding.  Screening Tests Health Maintenance  Topic Date Due   COVID-19 Vaccine (1) Never done   INFLUENZA VACCINE  08/29/2022 (Originally 12/29/2021)   TETANUS/TDAP  06/11/2022   Hepatitis C Screening  Completed   HIV Screening  Completed   HPV VACCINES  Aged Out    Health Maintenance  Health Maintenance Due  Topic Date Due   COVID-19 Vaccine (1) Never done    Colorectal Cancer Screening: Never done  Lung Cancer Screening: (Low Dose CT Chest recommended if Age 41-80 years, 30 pack-year currently smoking OR have quit w/in 15years.) does not qualify.   Lung Cancer Screening Referral: no  Additional Screening:  Hepatitis C Screening: does qualify; Completed 12/08/2017  Vision Screening: Recommended annual ophthalmology exams for early detection of glaucoma and other disorders of the eye. Is the patient up to date with their annual eye exam?  No ; no vision coverage Who  is the provider or what is the name of the office in which the patient attends annual eye exams? None If pt is not established with a provider, would they like to be referred to a provider to establish care? No .   Dental Screening: Recommended annual dental exams for proper oral hygiene  Community Resource Referral / Chronic Care Management: CRR required this visit?  No   CCM required this visit?  No      Plan:     I have personally reviewed and noted the following in the patient's chart:   Medical and social history Use of alcohol, tobacco or illicit drugs  Current medications and supplements including  opioid prescriptions. Patient is not currently taking opioid prescriptions. Functional ability and status Nutritional status Physical activity Advanced directives List of other physicians Hospitalizations, surgeries, and ER visits in previous 12 months Vitals Screenings to include cognitive, depression, and falls Referrals and appointments  In addition, I have reviewed and discussed with patient certain preventive protocols, quality metrics, and best practice recommendations. A written personalized care plan for preventive services as well as general preventive health recommendations were provided to patient.     Mickeal Needy, LPN   24/09/8097   Nurse Notes: N/A

## 2022-03-19 ENCOUNTER — Telehealth: Payer: Medicare Other | Admitting: Family Medicine

## 2022-03-19 DIAGNOSIS — K645 Perianal venous thrombosis: Secondary | ICD-10-CM

## 2022-03-19 NOTE — Progress Notes (Signed)
Virtual Visit Consent   Bruce Morales, you are scheduled for a virtual visit with a [facility] provider today. Just as with appointments in the office, your consent must be obtained to participate. Your consent will be active for this visit and any virtual visit you may have with one of our providers in the next 365 days. If you have a MyChart account, a copy of this consent can be sent to you electronically.  As this is a virtual visit, video technology does not allow for your provider to perform a traditional examination. This may limit your provider's ability to fully assess your condition. If your provider identifies any concerns that need to be evaluated in person or the need to arrange testing (such as labs, EKG, etc.), we will make arrangements to do so. Although advances in technology are sophisticated, we cannot ensure that it will always work on either your end or our end. If the connection with a video visit is poor, the visit may have to be switched to a telephone visit. With either a video or telephone visit, we are not always able to ensure that we have a secure connection.  By engaging in this virtual visit, you consent to the provision of healthcare and authorize for your insurance to be billed (if applicable) for the services provided during this visit. Depending on your insurance coverage, you may receive a charge related to this service.  I need to obtain your verbal consent now. Are you willing to proceed with your visit today? Bruce Morales has provided verbal consent on 03/19/2022 for a virtual visit (video or telephone). Georgana Curio, FNP  Date: 03/19/2022 11:39 AM  Virtual Visit via Video Note   I, Georgana Curio, connected with  Bruce Morales  ([id number], [birth year]) on 03/19/22 at 10:00 AM EDT by a video-enabled telemedicine application and verified that I am speaking with the correct person using two identifiers.  Location: Patient: Virtual Visit Location  Patient: Home Provider: Virtual Visit Location Provider: Home Office   I discussed the limitations of evaluation and management by telemedicine and the availability of in person appointments. The patient expressed understanding and agreed to proceed.    History of Present Illness: Bruce Morales is a 46 y.o. who identifies as a male who was assigned male at birth, and is being seen today for hemorrhoids. He is interested in finding a provider that will perform the HET procedure to allow for a less invasive procedure. I do not see any in this area and have messaged him back. He has surgery scheduled. He is also aware we do not provide referrals.   HPI: HPI  Problems:  Patient Active Problem List   Diagnosis Date Noted   Perianal abscess 03/03/2022   Ingrown toenail 03/03/2022   Hepatic steatosis 09/30/2021   History of tobacco use 02/06/2020   Other hyperlipidemia 07/18/2018   Depression 07/18/2018   Prediabetes 05/17/2018   Vitamin D deficiency 05/17/2018   Hearing loss 09/04/2015   Thrombocytopenia (HCC)    Pancytopenia (HCC) 06/24/2015   Status post lumbar laminectomy 06/10/2015   Class 2 severe obesity with serious comorbidity and body mass index (BMI) of 38.0 to 38.9 in adult [facility]) 11/13/2014    Allergies:  Allergies  Allergen Reactions   Other Hives and Itching   Hydrocodone Hives and Itching   Oxycodone     Mild Itching, patient can tolerate oxycodone   Percocet [Oxycodone-Acetaminophen]     Itching, patient says he tolerates  Medications:  Current Outpatient Medications:    ascorbic acid (VITAMIN C) 1000 MG tablet, Take by mouth., Disp: , Rfl:    CLOMID 50 MG tablet, Take 25 mg by mouth daily. (Patient not taking: Reported on 03/03/2022), Disp: , Rfl:    diltiazem (CARDIZEM) 30 MG tablet, TAKE 1 TABLET EVERY DAY AS NEEDED FOR RECTAL SPASM PAINS (Patient taking differently: Take 30 mg by mouth daily. TAKE 1 TABLET EVERY DAY), Disp: 90 tablet, Rfl: 1   GREEN TEA,  CAMELLIA SINENSIS, PO, Take by mouth daily in the afternoon., Disp: , Rfl:    HYDROmorphone (DILAUDID) 4 MG tablet, Take 4 mg by mouth every 6 (six) hours as needed., Disp: , Rfl:    rosuvastatin (CRESTOR) 10 MG tablet, Take 1 tablet (10 mg total) by mouth daily. (Patient not taking: Reported on 03/03/2022), Disp: 90 tablet, Rfl: 3   Semaglutide,0.25 or 0.5MG /DOS, (OZEMPIC, 0.25 OR 0.5 MG/DOSE,) 2 MG/1.5ML SOPN, See admin instructions., Disp: , Rfl:    tiZANidine (ZANAFLEX) 4 MG tablet, Take 1 tablet (4 mg total) by mouth every 8 (eight) hours as needed for muscle spasms., Disp: 30 tablet, Rfl: 1  Observations/Objective: Patient is well-developed, well-nourished in no acute distress.  Resting comfortably  at home.  Head is normocephalic, atraumatic.  No labored breathing.  Speech is clear and coherent with logical content.  Patient is alert and oriented at baseline.    Assessment and Plan: 1. Perianal venous thrombosis  Continue with plans for surgery unless he is able to find a less invasive option locally or travel.   Follow Up Instructions: I discussed the assessment and treatment plan with the patient. The patient was provided an opportunity to ask questions and all were answered. The patient agreed with the plan and demonstrated an understanding of the instructions.  A copy of instructions were sent to the patient via MyChart unless otherwise noted below.     The patient was advised to call back or seek an in-person evaluation if the symptoms worsen or if the condition fails to improve as anticipated.  Time:  I spent 15 minutes with the patient via telehealth technology discussing the above problems/concerns.    Dellia Nims, FNP

## 2022-03-19 NOTE — Patient Instructions (Signed)

## 2022-03-22 ENCOUNTER — Telehealth: Payer: Self-pay

## 2022-03-22 NOTE — Telephone Encounter (Signed)
Called patient and informed him of provider recommendation. Patient states he is going to call [facility] to see if he can get in sooner with them

## 2022-03-22 NOTE — Telephone Encounter (Signed)
Patient is calling in stating that he was recently seen by Dr.Sagardia for a Perianal venous thrombosis. He states he got a referral to [place], has an appt next week but the pain has increasingly got worse. Wants to know what he can do.

## 2022-03-22 NOTE — Telephone Encounter (Signed)
Pain getting increasingly worse.  Recommend to be seen at urgent care center.

## 2022-03-23 ENCOUNTER — Ambulatory Visit: Payer: Medicare Other | Admitting: Emergency Medicine

## 2022-03-23 ENCOUNTER — Telehealth: Payer: Self-pay | Admitting: Emergency Medicine

## 2022-03-23 NOTE — Telephone Encounter (Signed)
Pt called to report being billed for medical records sent without the case number in the subject line.  Faxed request to HIM Medical Records today asking them to Re-Fax records to [employer]; INCLUDE in Subject Line: CASE # [id number].

## 2022-04-05 ENCOUNTER — Encounter: Payer: Self-pay | Admitting: Emergency Medicine

## 2022-04-05 ENCOUNTER — Ambulatory Visit (INDEPENDENT_AMBULATORY_CARE_PROVIDER_SITE_OTHER): Payer: Medicare Other | Admitting: Emergency Medicine

## 2022-04-05 VITALS — BP 112/76 | HR 63 | Temp 98.6°F | Ht 68.0 in | Wt 222.2 lb

## 2022-04-05 DIAGNOSIS — E785 Hyperlipidemia, unspecified: Secondary | ICD-10-CM

## 2022-04-05 DIAGNOSIS — Z6838 Body mass index (BMI) 38.0-38.9, adult: Secondary | ICD-10-CM

## 2022-04-05 DIAGNOSIS — K760 Fatty (change of) liver, not elsewhere classified: Secondary | ICD-10-CM

## 2022-04-05 DIAGNOSIS — R7303 Prediabetes: Secondary | ICD-10-CM

## 2022-04-05 MED ORDER — TIZANIDINE HCL 4 MG PO TABS: 4.0000 mg | ORAL_TABLET | Freq: Three times a day (TID) | ORAL | 1 refills | Status: DC | PRN

## 2022-04-05 NOTE — Assessment & Plan Note (Signed)
Stable.  Diet and nutrition discussed. Lab Results  Component Value Date   CHOL 220 (H) 09/30/2021   HDL 34.30 (L) 09/30/2021   LDLCALC 158 (H) 09/30/2021   TRIG 136.0 09/30/2021   CHOLHDL 6 09/30/2021

## 2022-04-05 NOTE — Assessment & Plan Note (Addendum)
Stable.  Diet and nutrition discussed. Advised to decrease amount of daily carbohydrate intake and daily calories and increase amount of plant based protein in his diet. Eating better and exercising more.

## 2022-04-05 NOTE — Patient Instructions (Signed)
Health Maintenance, Male Adopting a healthy lifestyle and getting preventive care are important in promoting health and wellness. Ask your health care provider about: The right schedule for you to have regular tests and exams. Things you can do on your own to prevent diseases and keep yourself healthy. What should I know about diet, weight, and exercise? Eat a healthy diet  Eat a diet that includes plenty of vegetables, fruits, low-fat dairy products, and lean protein. Do not eat a lot of foods that are high in solid fats, added sugars, or sodium. Maintain a healthy weight Body mass index (BMI) is a measurement that can be used to identify possible weight problems. It estimates body fat based on height and weight. Your health care provider can help determine your BMI and help you achieve or maintain a healthy weight. Get regular exercise Get regular exercise. This is one of the most important things you can do for your health. Most adults should: Exercise for at least 150 minutes each week. The exercise should increase your heart rate and make you sweat (moderate-intensity exercise). Do strengthening exercises at least twice a week. This is in addition to the moderate-intensity exercise. Spend less time sitting. Even light physical activity can be beneficial. Watch cholesterol and blood lipids Have your blood tested for lipids and cholesterol at 44 years of age, then have this test every 5 years. You may need to have your cholesterol levels checked more often if: Your lipid or cholesterol levels are high. You are older than 44 years of age. You are at high risk for heart disease. What should I know about cancer screening? Many types of cancers can be detected early and may often be prevented. Depending on your health history and family history, you may need to have cancer screening at various ages. This may include screening for: Colorectal cancer. Prostate cancer. Skin cancer. Lung  cancer. What should I know about heart disease, diabetes, and high blood pressure? Blood pressure and heart disease High blood pressure causes heart disease and increases the risk of stroke. This is more likely to develop in people who have high blood pressure readings or are overweight. Talk with your health care provider about your target blood pressure readings. Have your blood pressure checked: Every 3-5 years if you are 18-39 years of age. Every year if you are 40 years old or older. If you are between the ages of 65 and 75 and are a current or former smoker, ask your health care provider if you should have a one-time screening for abdominal aortic aneurysm (AAA). Diabetes Have regular diabetes screenings. This checks your fasting blood sugar level. Have the screening done: Once every three years after age 45 if you are at a normal weight and have a low risk for diabetes. More often and at a younger age if you are overweight or have a high risk for diabetes. What should I know about preventing infection? Hepatitis B If you have a higher risk for hepatitis B, you should be screened for this virus. Talk with your health care provider to find out if you are at risk for hepatitis B infection. Hepatitis C Blood testing is recommended for: Everyone born from 1945 through 1965. Anyone with known risk factors for hepatitis C. Sexually transmitted infections (STIs) You should be screened each year for STIs, including gonorrhea and chlamydia, if: You are sexually active and are younger than 44 years of age. You are older than 44 years of age and your   health care provider tells you that you are at risk for this type of infection. Your sexual activity has changed since you were last screened, and you are at increased risk for chlamydia or gonorrhea. Ask your health care provider if you are at risk. Ask your health care provider about whether you are at high risk for HIV. Your health care provider  may recommend a prescription medicine to help prevent HIV infection. If you choose to take medicine to prevent HIV, you should first get tested for HIV. You should then be tested every 3 months for as long as you are taking the medicine. Follow these instructions at home: Alcohol use Do not drink alcohol if your health care provider tells you not to drink. If you drink alcohol: Limit how much you have to 0-2 drinks a day. Know how much alcohol is in your drink. In the [place] one drink equals one 12 oz bottle of beer (355 mL), one 5 oz glass of wine (148 mL), or one 1 oz glass of hard liquor (44 mL). Lifestyle Do not use any products that contain nicotine or tobacco. These products include cigarettes, chewing tobacco, and vaping devices, such as e-cigarettes. If you need help quitting, ask your health care provider. Do not use street drugs. Do not share needles. Ask your health care provider for help if you need support or information about quitting drugs. General instructions Schedule regular health, dental, and eye exams. Stay current with your vaccines. Tell your health care provider if: You often feel depressed. You have ever been abused or do not feel safe at home. Summary Adopting a healthy lifestyle and getting preventive care are important in promoting health and wellness. Follow your health care provider's instructions about healthy diet, exercising, and getting tested or screened for diseases. Follow your health care provider's instructions on monitoring your cholesterol and blood pressure. This information is not intended to replace advice given to you by your health care provider. Make sure you discuss any questions you have with your health care provider. Document Revised: 10/06/2020 Document Reviewed: 10/06/2020 Elsevier Patient Education  2023 Elsevier Inc.  

## 2022-04-05 NOTE — Assessment & Plan Note (Signed)
Eating better and exercising more. Losing weight. BMI down to 33.79

## 2022-04-05 NOTE — Progress Notes (Signed)
Bruce Morales 47 y.o.   Chief Complaint  Patient presents with   Follow-up    f/u appt, no concerns    HISTORY OF PRESENT ILLNESS: This is a 46 y.o. male here for 92-month follow-up. Overall doing well.  Has no complaints or medical concerns today.  HPI   Prior to Admission medications   Medication Sig Start Date End Date Taking? Authorizing Provider  ascorbic acid (VITAMIN C) 1000 MG tablet Take by mouth.    [provider]  diltiazem (CARDIZEM) 30 MG tablet TAKE 1 TABLET EVERY DAY AS NEEDED FOR RECTAL SPASM PAINS Patient taking differently: Take 30 mg by mouth daily. TAKE 1 TABLET EVERY DAY 07/09/20   [name], [name], MD  GREEN TEA, CAMELLIA SINENSIS, PO Take by mouth daily in the afternoon.    [provider]  HYDROmorphone (DILAUDID) 4 MG tablet Take 4 mg by mouth every 6 (six) hours as needed. 07/28/21   [provider]  Semaglutide,0.25 or 0.5MG /DOS, (OZEMPIC, 0.25 OR 0.5 MG/DOSE,) 2 MG/1.5ML SOPN See admin instructions. 09/14/21   [provider]  tiZANidine (ZANAFLEX) 4 MG tablet Take 1 tablet (4 mg total) by mouth every 8 (eight) hours as needed for muscle spasms. 06/18/19   [name], MD    Allergies  Allergen Reactions   Other Hives and Itching   Hydrocodone Hives and Itching   Oxycodone     Mild Itching, patient can tolerate oxycodone   Percocet [Oxycodone-Acetaminophen]     Itching, patient says he tolerates    Patient Active Problem List   Diagnosis Date Noted   Hepatic steatosis 09/30/2021   History of tobacco use 02/06/2020   Other hyperlipidemia 07/18/2018   Depression 07/18/2018   Prediabetes 05/17/2018   Vitamin D deficiency 05/17/2018   Hearing loss 09/04/2015   Thrombocytopenia (HCC)    Pancytopenia (HCC) 06/24/2015   Status post lumbar laminectomy 06/10/2015   Class 2 severe obesity with serious comorbidity and body mass index (BMI) of 38.0 to 38.9 in adult [facility]) 11/13/2014    Past  Medical History:  Diagnosis Date   Anginal pain (HCC) 04/15/2014   Arthritis    "wrists, knees, lower back" (04/16/2014)   Carpal tunnel syndrome    Chronic bronchitis (HCC)    "probably get it q yr"   Chronic lower back pain    Chronic lower back pain    Constipation    Gall stones    GERD (gastroesophageal reflux disease)    GERD (gastroesophageal reflux disease)    HLD (hyperlipidemia)    Hyperlipidemia    Phreesia 04/14/2020   Joint pain    Meningitis    Plantar fasciitis    Right knee pain    Sciatica     Past Surgical History:  Procedure Laterality Date   CARPAL TUNNEL RELEASE     02/2018 Right arm   CHOLECYSTECTOMY  2011   ESOPHAGOGASTRODUODENOSCOPY N/A 02/27/2013   Procedure: ESOPHAGOGASTRODUODENOSCOPY (EGD);  Surgeon: Florencia Reasons, MD;  Location: [facility];  Service: Endoscopy;  Laterality: N/A;   KNEE ARTHROSCOPY Right 2013 X 2   KNEE ARTHROSCOPY Right 02/22/2018   LAMINECTOMY AND MICRODISCECTOMY SPINE     LEFT HEART CATHETERIZATION WITH CORONARY ANGIOGRAM N/A 04/17/2014   Procedure: LEFT HEART CATHETERIZATION WITH CORONARY ANGIOGRAM;  Surgeon: Ricki Rodriguez, MD;  Location: [facility];  Service: Cardiovascular;  Laterality: N/A;   RADIOLOGY WITH ANESTHESIA N/A 06/11/2015   Procedure: MRI LUMBAR WITH AND WITHOUT CONTRAST;  Surgeon:  Medication Radiologist, MD;  Location: [facility] OR;  Service: Radiology;  Laterality: N/A;   SPINE SURGERY N/A    Phreesia 04/14/2020    Social History   Socioeconomic History   Marital status: Married    Spouse name: Janelle Floor    Number of children: 2   Years of education: Not on file   Highest education level: Not on file  Occupational History   Occupation: Disabled  Tobacco Use   Smoking status: Former    Packs/day: 0.50    Years: 27.00    Total pack years: 13.50    Types: Cigarettes    Quit date: 03/11/2015    Years since quitting: 7.0   Smokeless tobacco: Former  Building services engineer Use: Never used  Substance and  Sexual Activity   Alcohol use: Yes   Drug use: No    Comment: 04/16/2014 "in my teens"   Sexual activity: Yes  Other Topics Concern   Not on file  Social History Narrative   Drinks 2 cups of coffee in the morning.   Social Determinants of Health   Financial Resource Strain: Low Risk  (03/04/2022)   Overall Financial Resource Strain (CARDIA)    Difficulty of Paying Living Expenses: Not hard at all  Food Insecurity: No Food Insecurity (03/04/2022)   Hunger Vital Sign    Worried About Running Out of Food in the Last Year: Never true    Ran Out of Food in the Last Year: Never true  Transportation Needs: No Transportation Needs (03/04/2022)   PRAPARE - Administrator, Civil Service (Medical): No    Lack of Transportation (Non-Medical): No  Physical Activity: Sufficiently Active (03/04/2022)   Exercise Vital Sign    Days of Exercise per Week: 5 days    Minutes of Exercise per Session: 30 min  Stress: No Stress Concern Present (03/04/2022)   [employer] of Occupational Health - Occupational Stress Questionnaire    Feeling of Stress : Not at all  Social Connections: Socially Integrated (03/04/2022)   Social Connection and Isolation Panel [NHANES]    Frequency of Communication with Friends and Family: More than three times a week    Frequency of Social Gatherings with Friends and Family: More than three times a week    Attends Religious Services: More than 4 times per year    Active Member of [employer] or Organizations: Yes    Attends Engineer, structural: More than 4 times per year    Marital Status: Married  Catering manager Violence: Not At Risk (03/04/2022)   Humiliation, Afraid, Rape, and Kick questionnaire    Fear of Current or Ex-Partner: No    Emotionally Abused: No    Physically Abused: No    Sexually Abused: No    Family History  Problem Relation Age of Onset   Multiple sclerosis Mother    Obesity Mother    Heart failure Maternal Grandfather     Diabetes Father    Hyperlipidemia Father    Sleep apnea Father    Obesity Father    Fibromyalgia Sister    Schizophrenia Brother    Stroke Maternal Grandmother    Diabetes Paternal Grandmother      Review of Systems  Constitutional: Negative.  Negative for chills and fever.  HENT: Negative.  Negative for congestion and sore throat.   Respiratory: Negative.  Negative for cough and shortness of breath.   Cardiovascular: Negative.  Negative for chest pain and palpitations.  Gastrointestinal:  Negative for abdominal pain, nausea and vomiting.  Genitourinary: Negative.   Skin: Negative.  Negative for rash.  Neurological: Negative.  Negative for dizziness and headaches.  All other systems reviewed and are negative.   Today's Vitals   04/05/22 0809  BP: 112/76  Pulse: 63  Temp: 98.6 F (37 C)  TempSrc: Oral  SpO2: 98%  Weight: 222 lb 4 oz (100.8 kg)  Height: 5\' 8"  (1.727 m)   Body mass index is 33.79 kg/m. Wt Readings from Last 3 Encounters:  04/05/22 222 lb 4 oz (100.8 kg)  03/04/22 220 lb (99.8 kg)  03/03/22 220 lb 6 oz (100 kg)    Physical Exam Vitals reviewed.  Constitutional:      Appearance: Normal appearance.  HENT:     Head: Normocephalic.     Mouth/Throat:     Mouth: Mucous membranes are moist.     Pharynx: Oropharynx is clear.  Eyes:     Extraocular Movements: Extraocular movements intact.     Pupils: Pupils are equal, round, and reactive to light.  Cardiovascular:     Rate and Rhythm: Normal rate and regular rhythm.     Pulses: Normal pulses.     Heart sounds: Normal heart sounds.  Pulmonary:     Effort: Pulmonary effort is normal.     Breath sounds: Normal breath sounds.  Musculoskeletal:     Cervical back: No tenderness.  Lymphadenopathy:     Cervical: No cervical adenopathy.  Skin:    General: Skin is warm and dry.  Neurological:     General: No focal deficit present.     Mental Status: He is alert and oriented to person, place, and time.   Psychiatric:        Mood and Affect: Mood normal.        Behavior: Behavior normal.      ASSESSMENT & PLAN: A total of 42 minutes was spent with the patient and counseling/coordination of care regarding preparing for this visit, review of most recent office visit notes, review of all medications, review of most recent blood work results, review of multiple chronic medical conditions under treatment, education on nutrition, prognosis, documentation, and need for follow-up..  Problem List Items Addressed This Visit       Digestive   Hepatic steatosis    Stable.  Diet and nutrition discussed. Lab Results  Component Value Date   CHOL 220 (H) 09/30/2021   HDL 34.30 (L) 09/30/2021   LDLCALC 158 (H) 09/30/2021   TRIG 136.0 09/30/2021   CHOLHDL 6 09/30/2021          Other   Class 2 severe obesity with serious comorbidity and body mass index (BMI) of 38.0 to 38.9 in adult [facility](HCC)    Eating better and exercising more. Losing weight. BMI down to 33.79      Prediabetes    Stable.  Diet and nutrition discussed. Advised to decrease amount of daily carbohydrate intake and daily calories and increase amount of plant based protein in his diet. Eating better and exercising more.      Dyslipidemia - Primary    Stable.  Not taking rosuvastatin as prescribed last March. Eating better and exercising more. The 10-year ASCVD risk score (Arnett DK, et al., 2019) is: 2.7%   Values used to calculate the score:     Age: 89 years     Sex: Male     Is Non-Hispanic African American: No     Diabetic: No  Tobacco smoker: No     Systolic Blood Pressure: 053 mmHg     Is BP treated: No     HDL Cholesterol: 34.3 mg/dL     Total Cholesterol: 220 mg/dL       Patient Instructions  Health Maintenance, Male Adopting a healthy lifestyle and getting preventive care are important in promoting health and wellness. Ask your health care provider about: The right schedule for you to have regular tests  and exams. Things you can do on your own to prevent diseases and keep yourself healthy. What should I know about diet, weight, and exercise? Eat a healthy diet  Eat a diet that includes plenty of vegetables, fruits, low-fat dairy products, and lean protein. Do not eat a lot of foods that are high in solid fats, added sugars, or sodium. Maintain a healthy weight Body mass index (BMI) is a measurement that can be used to identify possible weight problems. It estimates body fat based on height and weight. Your health care provider can help determine your BMI and help you achieve or maintain a healthy weight. Get regular exercise Get regular exercise. This is one of the most important things you can do for your health. Most adults should: Exercise for at least 150 minutes each week. The exercise should increase your heart rate and make you sweat (moderate-intensity exercise). Do strengthening exercises at least twice a week. This is in addition to the moderate-intensity exercise. Spend less time sitting. Even light physical activity can be beneficial. Watch cholesterol and blood lipids Have your blood tested for lipids and cholesterol at 44 years of age, then have this test every 5 years. You may need to have your cholesterol levels checked more often if: Your lipid or cholesterol levels are high. You are older than 44 years of age. You are at high risk for heart disease. What should I know about cancer screening? Many types of cancers can be detected early and may often be prevented. Depending on your health history and family history, you may need to have cancer screening at various ages. This may include screening for: Colorectal cancer. Prostate cancer. Skin cancer. Lung cancer. What should I know about heart disease, diabetes, and high blood pressure? Blood pressure and heart disease High blood pressure causes heart disease and increases the risk of stroke. This is more likely to develop  in people who have high blood pressure readings or are overweight. Talk with your health care provider about your target blood pressure readings. Have your blood pressure checked: Every 3-5 years if you are 79-109 years of age. Every year if you are 43 years old or older. If you are between the ages of 70 and 67 and are a current or former smoker, ask your health care provider if you should have a one-time screening for abdominal aortic aneurysm (AAA). Diabetes Have regular diabetes screenings. This checks your fasting blood sugar level. Have the screening done: Once every three years after age 85 if you are at a normal weight and have a low risk for diabetes. More often and at a younger age if you are overweight or have a high risk for diabetes. What should I know about preventing infection? Hepatitis B If you have a higher risk for hepatitis B, you should be screened for this virus. Talk with your health care provider to find out if you are at risk for hepatitis B infection. Hepatitis C Blood testing is recommended for: Everyone born from [birth year] through 1963. Anyone  with known risk factors for hepatitis C. Sexually transmitted infections (STIs) You should be screened each year for STIs, including gonorrhea and chlamydia, if: You are sexually active and are younger than 44 years of age. You are older than 44 years of age and your health care provider tells you that you are at risk for this type of infection. Your sexual activity has changed since you were last screened, and you are at increased risk for chlamydia or gonorrhea. Ask your health care provider if you are at risk. Ask your health care provider about whether you are at high risk for HIV. Your health care provider may recommend a prescription medicine to help prevent HIV infection. If you choose to take medicine to prevent HIV, you should first get tested for HIV. You should then be tested every 3 months for as long as you are taking  the medicine. Follow these instructions at home: Alcohol use Do not drink alcohol if your health care provider tells you not to drink. If you drink alcohol: Limit how much you have to 0-2 drinks a day. Know how much alcohol is in your drink. In the [place] one drink equals one 12 oz bottle of beer (355 mL), one 5 oz glass of wine (148 mL), or one 1 oz glass of hard liquor (44 mL). Lifestyle Do not use any products that contain nicotine or tobacco. These products include cigarettes, chewing tobacco, and vaping devices, such as e-cigarettes. If you need help quitting, ask your health care provider. Do not use street drugs. Do not share needles. Ask your health care provider for help if you need support or information about quitting drugs. General instructions Schedule regular health, dental, and eye exams. Stay current with your vaccines. Tell your health care provider if: You often feel depressed. You have ever been abused or do not feel safe at home. Summary Adopting a healthy lifestyle and getting preventive care are important in promoting health and wellness. Follow your health care provider's instructions about healthy diet, exercising, and getting tested or screened for diseases. Follow your health care provider's instructions on monitoring your cholesterol and blood pressure. This information is not intended to replace advice given to you by your health care provider. Make sure you discuss any questions you have with your health care provider. Document Revised: 10/06/2020 Document Reviewed: 10/06/2020 Elsevier Patient Education  2023 Elsevier Inc.     Edwina Barth, MD [facility]

## 2022-04-05 NOTE — Assessment & Plan Note (Addendum)
Stable.  Not taking rosuvastatin as prescribed last March. Eating better and exercising more. The 10-year ASCVD risk score (Arnett DK, et al., 2019) is: 2.7%   Values used to calculate the score:     Age: 46 years     Sex: Male     Is Non-Hispanic African American: No     Diabetic: No     Tobacco smoker: No     Systolic Blood Pressure: 169 mmHg     Is BP treated: No     HDL Cholesterol: 34.3 mg/dL     Total Cholesterol: 220 mg/dL

## 2022-05-11 ENCOUNTER — Telehealth: Payer: Medicare Other | Admitting: Physician Assistant

## 2022-05-11 DIAGNOSIS — J111 Influenza due to unidentified influenza virus with other respiratory manifestations: Secondary | ICD-10-CM | POA: Diagnosis not present

## 2022-05-11 MED ORDER — XOFLUZA (80 MG DOSE) 1 X 80 MG PO TBPK: 80.0000 mg | ORAL_TABLET | Freq: Once | ORAL | 0 refills | Status: AC

## 2022-05-11 MED ORDER — PROMETHAZINE HCL 12.5 MG PO TABS: 12.5000 mg | ORAL_TABLET | Freq: Four times a day (QID) | ORAL | 0 refills | Status: DC | PRN

## 2022-05-11 NOTE — Patient Instructions (Signed)
[name], thank you for joining Tylene Fantasia Ward, PA-C for today's virtual visit.  While this provider is not your primary care provider (PCP), if your PCP is located in our provider database this encounter information will be shared with them immediately following your visit.   A [facility] MyChart account gives you access to today's visit and all your visits, tests, and labs performed at [facility] " click here if you don't have a [facility] MyChart account or go to [email or web address]  Consent: (Patient) [name] provided verbal consent for this virtual visit at the beginning of the encounter.  Current Medications:  Current Outpatient Medications:    Baloxavir Marboxil,80 MG Dose, (XOFLUZA, 80 MG DOSE,) 1 x 80 MG TBPK, Take 80 mg by mouth once for 1 dose., Disp: 1 each, Rfl: 0   promethazine (PHENERGAN) 12.5 MG tablet, Take 1 tablet (12.5 mg total) by mouth every 6 (six) hours as needed for nausea or vomiting., Disp: 30 tablet, Rfl: 0   ascorbic acid (VITAMIN C) 1000 MG tablet, Take by mouth., Disp: , Rfl:    diltiazem (CARDIZEM) 30 MG tablet, TAKE 1 TABLET EVERY DAY AS NEEDED FOR RECTAL SPASM PAINS (Patient taking differently: Take 30 mg by mouth daily. TAKE 1 TABLET EVERY DAY), Disp: 90 tablet, Rfl: 1   GREEN TEA, CAMELLIA SINENSIS, PO, Take by mouth daily in the afternoon., Disp: , Rfl:    HYDROmorphone (DILAUDID) 4 MG tablet, Take 4 mg by mouth every 6 (six) hours as needed., Disp: , Rfl:    Semaglutide,0.25 or 0.5MG /DOS, (OZEMPIC, 0.25 OR 0.5 MG/DOSE,) 2 MG/1.5ML SOPN, See admin instructions., Disp: , Rfl:    tiZANidine (ZANAFLEX) 4 MG tablet, Take 1 tablet (4 mg total) by mouth every 8 (eight) hours as needed for muscle spasms., Disp: 30 tablet, Rfl: 1   Medications ordered in this encounter:  Meds ordered this encounter  Medications   Baloxavir Marboxil,80 MG Dose, (XOFLUZA, 80 MG DOSE,) 1 x 80 MG TBPK    Sig: Take 80 mg by mouth once for 1  dose.    Dispense:  1 each    Refill:  0   promethazine (PHENERGAN) 12.5 MG tablet    Sig: Take 1 tablet (12.5 mg total) by mouth every 6 (six) hours as needed for nausea or vomiting.    Dispense:  30 tablet    Refill:  0     *If you need refills on other medications prior to your next appointment, please contact your pharmacy*  Follow-Up: Call back or seek an in-person evaluation if the symptoms worsen or if the condition fails to improve as anticipated.  [facility] [phone]  Other Instructions Take Lasandra Beech as prescribed.  Call if any issues filling the prescription. Can take phenergan every 6 hours as needed for nausea Recommend Mucinex and Flonase for congestion/sinus pressure.  Recommend Delsym cough syrup as needed for cough Recommend Ibuprofen or Tylenol as needed for body aches, headache, and fever Drink plenty of fluids and rest.  Follow up with an in person visit with Urgent Care or your Primary Care Physician if your symptoms become worse.    If you have been instructed to have an in-person evaluation today at a local Urgent Care facility, please use the link below. It will take you to a list of all of our available [facility], including address, phone number and hours of operation. Please do not delay care.  [facility]  If you or a family member do not have a primary care provider, use the link below to schedule a visit and establish care. When you choose a [facility] primary care physician or advanced practice provider, you gain a long-term partner in health. Find a Primary Care Provider  Learn more about 's in-office and virtual care options: [facility]

## 2022-05-11 NOTE — Progress Notes (Signed)
Virtual Visit Consent   Bruce Morales, you are scheduled for a virtual visit with a [facility] provider today. Just as with appointments in the office, your consent must be obtained to participate. Your consent will be active for this visit and any virtual visit you may have with one of our providers in the next 365 days. If you have a MyChart account, a copy of this consent can be sent to you electronically.  As this is a virtual visit, video technology does not allow for your provider to perform a traditional examination. This may limit your provider's ability to fully assess your condition. If your provider identifies any concerns that need to be evaluated in person or the need to arrange testing (such as labs, EKG, etc.), we will make arrangements to do so. Although advances in technology are sophisticated, we cannot ensure that it will always work on either your end or our end. If the connection with a video visit is poor, the visit may have to be switched to a telephone visit. With either a video or telephone visit, we are not always able to ensure that we have a secure connection.  By engaging in this virtual visit, you consent to the provision of healthcare and authorize for your insurance to be billed (if applicable) for the services provided during this visit. Depending on your insurance coverage, you may receive a charge related to this service.  I need to obtain your verbal consent now. Are you willing to proceed with your visit today? Bruce Morales has provided verbal consent on 05/11/2022 for a virtual visit (video or telephone). Tylene Fantasia Ward, PA-C  Date: 05/11/2022 6:14 PM  Virtual Visit via Video Note   I, Tylene Fantasia Ward, connected with  Bruce Morales  ([id number], March 01, 1976) on 05/11/22 at  6:15 PM EST by a video-enabled telemedicine application and verified that I am speaking with the correct person using two identifiers.  Location: Patient: Virtual Visit  Location Patient: Home Provider: Virtual Visit Location Provider: Home Office   I discussed the limitations of evaluation and management by telemedicine and the availability of in person appointments. The patient expressed understanding and agreed to proceed.    History of Present Illness: Bruce Morales is a 46 y.o. who identifies as a male who was assigned male at birth, and is being seen today for congestion, cough, body aches, nausea that started earlier today.  He reports family members in his household have recently tested positive for flue.  Pt is requesting Xofluza instead of tamilu.  He denies wheezing, shortness of breath, fever, or vomiting.  He has taken otc medications with some relief.  HPI: HPI  Problems:  Patient Active Problem List   Diagnosis Date Noted   Dyslipidemia 04/05/2022   Hepatic steatosis 09/30/2021   History of tobacco use 02/06/2020   Other hyperlipidemia 07/18/2018   Depression 07/18/2018   Prediabetes 05/17/2018   Vitamin D deficiency 05/17/2018   Hearing loss 09/04/2015   Thrombocytopenia (HCC)    Pancytopenia (HCC) 06/24/2015   Status post lumbar laminectomy 06/10/2015   Class 2 severe obesity with serious comorbidity and body mass index (BMI) of 38.0 to 38.9 in adult [facility]) 11/13/2014    Allergies:  Allergies  Allergen Reactions   Other Hives and Itching   Hydrocodone Hives and Itching   Oxycodone     Mild Itching, patient can tolerate oxycodone   Percocet [Oxycodone-Acetaminophen]     Itching, patient says he tolerates  Medications:  Current Outpatient Medications:    Baloxavir Marboxil,80 MG Dose, (XOFLUZA, 80 MG DOSE,) 1 x 80 MG TBPK, Take 80 mg by mouth once for 1 dose., Disp: 1 each, Rfl: 0   promethazine (PHENERGAN) 12.5 MG tablet, Take 1 tablet (12.5 mg total) by mouth every 6 (six) hours as needed for nausea or vomiting., Disp: 30 tablet, Rfl: 0   ascorbic acid (VITAMIN C) 1000 MG tablet, Take by mouth., Disp: , Rfl:    diltiazem  (CARDIZEM) 30 MG tablet, TAKE 1 TABLET EVERY DAY AS NEEDED FOR RECTAL SPASM PAINS (Patient taking differently: Take 30 mg by mouth daily. TAKE 1 TABLET EVERY DAY), Disp: 90 tablet, Rfl: 1   GREEN TEA, CAMELLIA SINENSIS, PO, Take by mouth daily in the afternoon., Disp: , Rfl:    HYDROmorphone (DILAUDID) 4 MG tablet, Take 4 mg by mouth every 6 (six) hours as needed., Disp: , Rfl:    Semaglutide,0.25 or 0.5MG /DOS, (OZEMPIC, 0.25 OR 0.5 MG/DOSE,) 2 MG/1.5ML SOPN, See admin instructions., Disp: , Rfl:    tiZANidine (ZANAFLEX) 4 MG tablet, Take 1 tablet (4 mg total) by mouth every 8 (eight) hours as needed for muscle spasms., Disp: 30 tablet, Rfl: 1  Observations/Objective: Patient is well-developed, well-nourished in no acute distress.  Resting comfortably  at home.  Head is normocephalic, atraumatic.  No labored breathing.  Speech is clear and coherent with logical content.  Patient is alert and oriented at baseline.    Assessment and Plan: 1. Influenza - Baloxavir Marboxil,80 MG Dose, (XOFLUZA, 80 MG DOSE,) 1 x 80 MG TBPK; Take 80 mg by mouth once for 1 dose.  Dispense: 1 each; Refill: 0 - promethazine (PHENERGAN) 12.5 MG tablet; Take 1 tablet (12.5 mg total) by mouth every 6 (six) hours as needed for nausea or vomiting.  Dispense: 30 tablet; Refill: 0  Pt requested Lasandra Beech, reports family members were also prescribed this.  He has had no relief with zofran in the past, requesting phenergan.  Supportive care discussed.  In person evaluation precaution discussed.   Follow Up Instructions: I discussed the assessment and treatment plan with the patient. The patient was provided an opportunity to ask questions and all were answered. The patient agreed with the plan and demonstrated an understanding of the instructions.  A copy of instructions were sent to the patient via MyChart unless otherwise noted below.     The patient was advised to call back or seek an in-person evaluation if the symptoms  worsen or if the condition fails to improve as anticipated.  Time:  I spent 12 minutes with the patient via telehealth technology discussing the above problems/concerns.    Tylene Fantasia Ward, PA-C

## 2022-06-23 ENCOUNTER — Telehealth: Payer: Medicare Other | Admitting: Nurse Practitioner

## 2022-06-23 DIAGNOSIS — Z77120 Contact with and (suspected) exposure to mold (toxic): Secondary | ICD-10-CM | POA: Diagnosis not present

## 2022-06-23 DIAGNOSIS — J0100 Acute maxillary sinusitis, unspecified: Secondary | ICD-10-CM | POA: Diagnosis not present

## 2022-06-23 MED ORDER — CHLORPHEN-PE-ACETAMINOPHEN 4-10-325 MG PO TABS: 1.0000 | ORAL_TABLET | Freq: Four times a day (QID) | ORAL | 0 refills | Status: DC | PRN

## 2022-06-23 MED ORDER — AMOXICILLIN-POT CLAVULANATE 875-125 MG PO TABS: 1.0000 | ORAL_TABLET | Freq: Two times a day (BID) | ORAL | 0 refills | Status: DC

## 2022-06-23 NOTE — Progress Notes (Signed)
Virtual Visit Consent   Bruce Morales, you are scheduled for a virtual visit with Mary-Margaret Hassell Done, [place], a [facility] provider, today.     Just as with appointments in the office, your consent must be obtained to participate.  Your consent will be active for this visit and any virtual visit you may have with one of our providers in the next 365 days.     If you have a MyChart account, a copy of this consent can be sent to you electronically.  All virtual visits are billed to your insurance company just like a traditional visit in the office.    As this is a virtual visit, video technology does not allow for your provider to perform a traditional examination.  This may limit your provider's ability to fully assess your condition.  If your provider identifies any concerns that need to be evaluated in person or the need to arrange testing (such as labs, EKG, etc.), we will make arrangements to do so.     Although advances in technology are sophisticated, we cannot ensure that it will always work on either your end or our end.  If the connection with a video visit is poor, the visit may have to be switched to a telephone visit.  With either a video or telephone visit, we are not always able to ensure that we have a secure connection.     I need to obtain your verbal consent now.   Are you willing to proceed with your visit today? YES   Bruce Morales has provided verbal consent on 06/23/2022 for a virtual visit (video or telephone).   Mary-Margaret Hassell Done, FNP   Date: 06/23/2022 10:27 AM   Virtual Visit via Video Note   I, Mary-Margaret Hassell Done, connected with Bruce Morales ([id number], 1976-02-13) on 06/23/22 at 11:30 AM EST by a video-enabled telemedicine application and verified that I am speaking with the correct person using two identifiers.  Location: Patient: Virtual Visit Location Patient: Home Provider: Virtual Visit Location Provider: Mobile   I discussed the  limitations of evaluation and management by telemedicine and the availability of in person appointments. The patient expressed understanding and agreed to proceed.    History of Present Illness: Bruce Morales is a 47 y.o. who identifies as a male who was assigned male at birth, and is being seen today for congestion.  HPI: Patient says his sister and him flew to [place] to visit his [place]. While there he founf black mold in bathroom. Since then he has noyt felt good.   URI  This is a new problem. The current episode started in the past 7 days. The problem has been gradually worsening. There has been no fever. Associated symptoms include congestion, coughing, headaches, rhinorrhea and sinus pain. Pertinent negatives include no sore throat. He has tried acetaminophen (nettie rinse, vitamin c) for the symptoms.    Review of Systems  HENT:  Positive for congestion, rhinorrhea and sinus pain. Negative for sore throat.   Respiratory:  Positive for cough.   Neurological:  Positive for headaches.    Problems:  Patient Active Problem List   Diagnosis Date Noted   Dyslipidemia 04/05/2022   Hepatic steatosis 09/30/2021   History of tobacco use 02/06/2020   Other hyperlipidemia 07/18/2018   Depression 07/18/2018   Prediabetes 05/17/2018   Vitamin D deficiency 05/17/2018   Hearing loss 09/04/2015   Thrombocytopenia ([place])    Pancytopenia ([place]) 06/24/2015   Status  post lumbar laminectomy 06/10/2015   Class 2 severe obesity with serious comorbidity and body mass index (BMI) of 38.0 to 38.9 in adult [facility]) 11/13/2014    Allergies:  Allergies  Allergen Reactions   Other Hives and Itching   Hydrocodone Hives and Itching   Oxycodone     Mild Itching, patient can tolerate oxycodone   Percocet [Oxycodone-Acetaminophen]     Itching, patient says he tolerates   Medications:  Current Outpatient Medications:    ascorbic acid (VITAMIN C) 1000 MG tablet, Take by mouth., Disp: , Rfl:     diltiazem (CARDIZEM) 30 MG tablet, TAKE 1 TABLET EVERY DAY AS NEEDED FOR RECTAL SPASM PAINS (Patient taking differently: Take 30 mg by mouth daily. TAKE 1 TABLET EVERY DAY), Disp: 90 tablet, Rfl: 1   GREEN TEA, CAMELLIA SINENSIS, PO, Take by mouth daily in the afternoon., Disp: , Rfl:    HYDROmorphone (DILAUDID) 4 MG tablet, Take 4 mg by mouth every 6 (six) hours as needed., Disp: , Rfl:    promethazine (PHENERGAN) 12.5 MG tablet, Take 1 tablet (12.5 mg total) by mouth every 6 (six) hours as needed for nausea or vomiting., Disp: 30 tablet, Rfl: 0   Semaglutide,0.25 or 0.5MG /DOS, (OZEMPIC, 0.25 OR 0.5 MG/DOSE,) 2 MG/1.5ML SOPN, See admin instructions., Disp: , Rfl:    tiZANidine (ZANAFLEX) 4 MG tablet, Take 1 tablet (4 mg total) by mouth every 8 (eight) hours as needed for muscle spasms., Disp: 30 tablet, Rfl: 1  Observations/Objective: Patient is well-developed, well-nourished in no acute distress.  Resting comfortably  at home.  Head is normocephalic, atraumatic.  No labored breathing.  Speech is clear and coherent with logical content.  Patient is alert and oriented at baseline.  Raspy vice Maxillary sinus tenderness  Assessment and Plan:  Bruce Morales in today with chief complaint of Nasal Congestion   1. Mold exposure  2. Acute non-recurrent maxillary sinusitis 1. Take meds as prescribed 2. Use a cool mist humidifier especially during the winter months and when heat has been humid. 3. Use saline nose sprays frequently 4. Saline irrigations of the nose can be very helpful if done frequently.  * 4X daily for 1 week*  * Use of a nettie pot can be helpful with this. Follow directions with this* 5. Drink plenty of fluids 6. Keep thermostat turn down low 7.For any cough or congestion- norel AD 8. For fever or aces or pains- take tylenol or ibuprofen appropriate for age and weight.  * for fevers greater than 101 orally you may alternate ibuprofen and tylenol every  3  hours.    Meds ordered this encounter  Medications   amoxicillin-clavulanate (AUGMENTIN) 875-125 MG tablet    Sig: Take 1 tablet by mouth 2 (two) times daily.    Dispense:  14 tablet    Refill:  0    Order Specific Question:   Supervising Provider    Answer:   Chase Picket [id number]   Chlorphen-PE-Acetaminophen 4-10-325 MG TABS    Sig: Take 1 tablet by mouth every 6 (six) hours as needed.    Dispense:  20 tablet    Refill:  0    Order Specific Question:   Supervising Provider    Answer:   Chase Picket [id number]       Follow Up Instructions: I discussed the assessment and treatment plan with the patient. The patient was provided an opportunity to ask questions and all were answered. The patient agreed with the plan  and demonstrated an understanding of the instructions.  A copy of instructions were sent to the patient via MyChart.  The patient was advised to call back or seek an in-person evaluation if the symptoms worsen or if the condition fails to improve as anticipated.  Time:  I spent 10 minutes with the patient via telehealth technology discussing the above problems/concerns.    Mary-Margaret Daphine Deutscher, FNP

## 2022-06-23 NOTE — Patient Instructions (Signed)
Bruce Morales, thank you for joining Chevis Pretty, FNP for today's virtual visit.  While this provider is not your primary care provider (PCP), if your PCP is located in our provider database this encounter information will be shared with them immediately following your visit.   [facility] account gives you access to today's visit and all your visits, tests, and labs performed at [facility] " click here if you don't have a [place] account or go to [email or web address]  Consent: (Patient) Bruce Morales provided verbal consent for this virtual visit at the beginning of the encounter.  Current Medications:  Current Outpatient Medications:    amoxicillin-clavulanate (AUGMENTIN) 875-125 MG tablet, Take 1 tablet by mouth 2 (two) times daily., Disp: 14 tablet, Rfl: 0   Chlorphen-PE-Acetaminophen 4-10-325 MG TABS, Take 1 tablet by mouth every 6 (six) hours as needed., Disp: 20 tablet, Rfl: 0   ascorbic acid (VITAMIN C) 1000 MG tablet, Take by mouth., Disp: , Rfl:    diltiazem (CARDIZEM) 30 MG tablet, TAKE 1 TABLET EVERY DAY AS NEEDED FOR RECTAL SPASM PAINS (Patient taking differently: Take 30 mg by mouth daily. TAKE 1 TABLET EVERY DAY), Disp: 90 tablet, Rfl: 1   GREEN TEA, CAMELLIA SINENSIS, PO, Take by mouth daily in the afternoon., Disp: , Rfl:    HYDROmorphone (DILAUDID) 4 MG tablet, Take 4 mg by mouth every 6 (six) hours as needed., Disp: , Rfl:    promethazine (PHENERGAN) 12.5 MG tablet, Take 1 tablet (12.5 mg total) by mouth every 6 (six) hours as needed for nausea or vomiting., Disp: 30 tablet, Rfl: 0   Semaglutide,0.25 or 0.5MG /DOS, (OZEMPIC, 0.25 OR 0.5 MG/DOSE,) 2 MG/1.5ML SOPN, See admin instructions., Disp: , Rfl:    tiZANidine (ZANAFLEX) 4 MG tablet, Take 1 tablet (4 mg total) by mouth every 8 (eight) hours as needed for muscle spasms., Disp: 30 tablet, Rfl: 1   Medications ordered in this encounter:  Meds ordered this encounter   Medications   amoxicillin-clavulanate (AUGMENTIN) 875-125 MG tablet    Sig: Take 1 tablet by mouth 2 (two) times daily.    Dispense:  14 tablet    Refill:  0    Order Specific Question:   Supervising Provider    Answer:   Chase Picket [id number]   Chlorphen-PE-Acetaminophen 4-10-325 MG TABS    Sig: Take 1 tablet by mouth every 6 (six) hours as needed.    Dispense:  20 tablet    Refill:  0    Order Specific Question:   Supervising Provider    Answer:   Chase Picket [id number]     *If you need refills on other medications prior to your next appointment, please contact your pharmacy*  Follow-Up: Call back or seek an in-person evaluation if the symptoms worsen or if the condition fails to improve as anticipated.  [name] [phone]  Other Instructions 1. Take meds as prescribed 2. Use a cool mist humidifier especially during the winter months and when heat has been humid. 3. Use saline nose sprays frequently 4. Saline irrigations of the nose can be very helpful if done frequently.  * 4X daily for 1 week*  * Use of a nettie pot can be helpful with this. Follow directions with this* 5. Drink plenty of fluids 6. Keep thermostat turn down low 7.For any cough or congestion- norel ad 8. For fever or aces or pains- take tylenol or ibuprofen appropriate for age and weight.  *  for fevers greater than 101 orally you may alternate ibuprofen and tylenol every  3 hours.      If you have been instructed to have an in-person evaluation today at a local Urgent Care facility, please use the link below. It will take you to a list of all of our available [facility], including address, phone number and hours of operation. Please do not delay care.  [facility]  If you or a family member do not have a primary care provider, use the link below to schedule a visit and establish care. When you choose a [place] primary care physician or  advanced practice provider, you gain a long-term partner in health. Find a Primary Care Provider  Learn more about [facility]'s in-office and virtual care options: [facility]

## 2022-07-12 ENCOUNTER — Telehealth: Payer: Medicare Other | Admitting: Physician Assistant

## 2022-07-12 DIAGNOSIS — R6889 Other general symptoms and signs: Secondary | ICD-10-CM | POA: Diagnosis not present

## 2022-07-12 DIAGNOSIS — Z20828 Contact with and (suspected) exposure to other viral communicable diseases: Secondary | ICD-10-CM

## 2022-07-12 MED ORDER — XOFLUZA (80 MG DOSE) 1 X 80 MG PO TBPK: 80.0000 mg | ORAL_TABLET | Freq: Once | ORAL | 0 refills | Status: AC

## 2022-07-12 MED ORDER — PROMETHAZINE-DM 6.25-15 MG/5ML PO SYRP: 5.0000 mL | ORAL_SOLUTION | Freq: Four times a day (QID) | ORAL | 0 refills | Status: DC | PRN

## 2022-07-12 NOTE — Progress Notes (Signed)
Virtual Visit Consent   Bruce Morales, you are scheduled for a virtual visit with a [facility] provider today. Just as with appointments in the office, your consent must be obtained to participate. Your consent will be active for this visit and any virtual visit you may have with one of our providers in the next 365 days. If you have a MyChart account, a copy of this consent can be sent to you electronically.  As this is a virtual visit, video technology does not allow for your provider to perform a traditional examination. This may limit your provider's ability to fully assess your condition. If your provider identifies any concerns that need to be evaluated in person or the need to arrange testing (such as labs, EKG, etc.), we will make arrangements to do so. Although advances in technology are sophisticated, we cannot ensure that it will always work on either your end or our end. If the connection with a video visit is poor, the visit may have to be switched to a telephone visit. With either a video or telephone visit, we are not always able to ensure that we have a secure connection.  By engaging in this virtual visit, you consent to the provision of healthcare and authorize for your insurance to be billed (if applicable) for the services provided during this visit. Depending on your insurance coverage, you may receive a charge related to this service.  I need to obtain your verbal consent now. Are you willing to proceed with your visit today? Bruce Morales has provided verbal consent on 07/12/2022 for a virtual visit (video or telephone). Mar Daring, PA-C  Date: 07/12/2022 1:56 PM  Virtual Visit via Video Note   I, Mar Daring, connected with  Bruce Morales  ([id number], [birth year]) on 07/12/22 at  1:45 PM EST by a video-enabled telemedicine application and verified that I am speaking with the correct person using two identifiers.  Location: Patient: Virtual  Visit Location Patient: Home Provider: Virtual Visit Location Provider: Home Office   I discussed the limitations of evaluation and management by telemedicine and the availability of in person appointments. The patient expressed understanding and agreed to proceed.    History of Present Illness: Bruce Morales is a 47 y.o. who identifies as a male who was assigned male at birth, and is being seen today for flu-like symptoms.  HPI: URI  This is a new problem. The current episode started yesterday. The problem has been gradually worsening. The maximum temperature recorded prior to his arrival was 100.4 - 100.9 F. The fever has been present for Less than 1 day. Associated symptoms include congestion, coughing, headaches, sinus pain and a sore throat. Pertinent negatives include no diarrhea, ear pain, nausea, plugged ear sensation, rhinorrhea or vomiting. Associated symptoms comments: Body aches, chills, sweats. He has tried acetaminophen (homemade chicken soup, pushed fluids, tylenol) for the symptoms. The treatment provided no relief.  Daughter diagnosed with influenza last week.   Was treated 04/2022 for influenza. Treated 06/23/22 with Augmentin for possible sinusitis from black mold.    Problems:  Patient Active Problem List   Diagnosis Date Noted   Dyslipidemia 04/05/2022   Hepatic steatosis 09/30/2021   History of tobacco use 02/06/2020   Other hyperlipidemia 07/18/2018   Depression 07/18/2018   Prediabetes 05/17/2018   Vitamin D deficiency 05/17/2018   Hearing loss 09/04/2015   Thrombocytopenia ([place])    Pancytopenia ([place]) 06/24/2015   Status post lumbar laminectomy 06/10/2015  Class 2 severe obesity with serious comorbidity and body mass index (BMI) of 38.0 to 38.9 in adult [facility]) 11/13/2014    Allergies:  Allergies  Allergen Reactions   Other Hives and Itching   Hydrocodone Hives and Itching   Oxycodone     Mild Itching, patient can tolerate oxycodone   Percocet  [Oxycodone-Acetaminophen]     Itching, patient says he tolerates   Medications:  Current Outpatient Medications:    Baloxavir Marboxil,80 MG Dose, (XOFLUZA, 80 MG DOSE,) 1 x 80 MG TBPK, Take 80 mg by mouth once for 1 dose., Disp: 1 each, Rfl: 0   promethazine-dextromethorphan (PROMETHAZINE-DM) 6.25-15 MG/5ML syrup, Take 5 mLs by mouth 4 (four) times daily as needed., Disp: 118 mL, Rfl: 0   amoxicillin-clavulanate (AUGMENTIN) 875-125 MG tablet, Take 1 tablet by mouth 2 (two) times daily., Disp: 14 tablet, Rfl: 0   ascorbic acid (VITAMIN C) 1000 MG tablet, Take by mouth., Disp: , Rfl:    Chlorphen-PE-Acetaminophen 4-10-325 MG TABS, Take 1 tablet by mouth every 6 (six) hours as needed., Disp: 20 tablet, Rfl: 0   diltiazem (CARDIZEM) 30 MG tablet, TAKE 1 TABLET EVERY DAY AS NEEDED FOR RECTAL SPASM PAINS (Patient taking differently: Take 30 mg by mouth daily. TAKE 1 TABLET EVERY DAY), Disp: 90 tablet, Rfl: 1   GREEN TEA, CAMELLIA SINENSIS, PO, Take by mouth daily in the afternoon., Disp: , Rfl:    HYDROmorphone (DILAUDID) 4 MG tablet, Take 4 mg by mouth every 6 (six) hours as needed., Disp: , Rfl:    promethazine (PHENERGAN) 12.5 MG tablet, Take 1 tablet (12.5 mg total) by mouth every 6 (six) hours as needed for nausea or vomiting., Disp: 30 tablet, Rfl: 0   Semaglutide,0.25 or 0.5MG/DOS, (OZEMPIC, 0.25 OR 0.5 MG/DOSE,) 2 MG/1.5ML SOPN, See admin instructions., Disp: , Rfl:    tiZANidine (ZANAFLEX) 4 MG tablet, Take 1 tablet (4 mg total) by mouth every 8 (eight) hours as needed for muscle spasms., Disp: 30 tablet, Rfl: 1  Observations/Objective: Patient is well-developed, well-nourished in no acute distress. Appears ill.  Resting comfortably at home.  Head is normocephalic, atraumatic.  No labored breathing.  Speech is clear and coherent with logical content.  Patient is alert and oriented at baseline.  Congested voice  Assessment and Plan: 1. Exposure to the flu - Baloxavir Marboxil,80 MG  Dose, (XOFLUZA, 80 MG DOSE,) 1 x 80 MG TBPK; Take 80 mg by mouth once for 1 dose.  Dispense: 1 each; Refill: 0  2. Flu-like symptoms - Baloxavir Marboxil,80 MG Dose, (XOFLUZA, 80 MG DOSE,) 1 x 80 MG TBPK; Take 80 mg by mouth once for 1 dose.  Dispense: 1 each; Refill: 0 - promethazine-dextromethorphan (PROMETHAZINE-DM) 6.25-15 MG/5ML syrup; Take 5 mLs by mouth 4 (four) times daily as needed.  Dispense: 118 mL; Refill: 0  - Suspect influenza due to symptoms and positive exposure - Xofluza prescribed - Promehazine DM for cough - Continue OTC medication of choice for symptomatic management - Push fluids - Rest - Seek in person evaluation if symptoms worsen or fail to improve   Follow Up Instructions: I discussed the assessment and treatment plan with the patient. The patient was provided an opportunity to ask questions and all were answered. The patient agreed with the plan and demonstrated an understanding of the instructions.  A copy of instructions were sent to the patient via MyChart unless otherwise noted below.    The patient was advised to call back or seek an in-person evaluation  if the symptoms worsen or if the condition fails to improve as anticipated.  Time:  I spent 10 minutes with the patient via telehealth technology discussing the above problems/concerns.    Mar Daring, PA-C

## 2022-07-12 NOTE — Patient Instructions (Signed)
Bruce Morales, thank you for joining Mar Daring, PA-C for today's virtual visit.  While this provider is not your primary care provider (PCP), if your PCP is located in our provider database this encounter information will be shared with them immediately following your visit.   [facility] account gives you access to today's visit and all your visits, tests, and labs performed at [facility] " click here if you don't have a [facility] account or go to [email or web address]  Consent: (Patient) Bruce Morales provided verbal consent for this virtual visit at the beginning of the encounter.  Current Medications:  Current Outpatient Medications:    Baloxavir Marboxil,80 MG Dose, (XOFLUZA, 80 MG DOSE,) 1 x 80 MG TBPK, Take 80 mg by mouth once for 1 dose., Disp: 1 each, Rfl: 0   promethazine-dextromethorphan (PROMETHAZINE-DM) 6.25-15 MG/5ML syrup, Take 5 mLs by mouth 4 (four) times daily as needed., Disp: 118 mL, Rfl: 0   amoxicillin-clavulanate (AUGMENTIN) 875-125 MG tablet, Take 1 tablet by mouth 2 (two) times daily., Disp: 14 tablet, Rfl: 0   ascorbic acid (VITAMIN C) 1000 MG tablet, Take by mouth., Disp: , Rfl:    Chlorphen-PE-Acetaminophen 4-10-325 MG TABS, Take 1 tablet by mouth every 6 (six) hours as needed., Disp: 20 tablet, Rfl: 0   diltiazem (CARDIZEM) 30 MG tablet, TAKE 1 TABLET EVERY DAY AS NEEDED FOR RECTAL SPASM PAINS (Patient taking differently: Take 30 mg by mouth daily. TAKE 1 TABLET EVERY DAY), Disp: 90 tablet, Rfl: 1   GREEN TEA, CAMELLIA SINENSIS, PO, Take by mouth daily in the afternoon., Disp: , Rfl:    HYDROmorphone (DILAUDID) 4 MG tablet, Take 4 mg by mouth every 6 (six) hours as needed., Disp: , Rfl:    promethazine (PHENERGAN) 12.5 MG tablet, Take 1 tablet (12.5 mg total) by mouth every 6 (six) hours as needed for nausea or vomiting., Disp: 30 tablet, Rfl: 0   Semaglutide,0.25 or 0.5MG/DOS, (OZEMPIC, 0.25 OR 0.5 MG/DOSE,) 2  MG/1.5ML SOPN, See admin instructions., Disp: , Rfl:    tiZANidine (ZANAFLEX) 4 MG tablet, Take 1 tablet (4 mg total) by mouth every 8 (eight) hours as needed for muscle spasms., Disp: 30 tablet, Rfl: 1   Medications ordered in this encounter:  Meds ordered this encounter  Medications   Baloxavir Marboxil,80 MG Dose, (XOFLUZA, 80 MG DOSE,) 1 x 80 MG TBPK    Sig: Take 80 mg by mouth once for 1 dose.    Dispense:  1 each    Refill:  0    Order Specific Question:   Supervising Provider    Answer:   [name]   promethazine-dextromethorphan (PROMETHAZINE-DM) 6.25-15 MG/5ML syrup    Sig: Take 5 mLs by mouth 4 (four) times daily as needed.    Dispense:  118 mL    Refill:  0    Order Specific Question:   Supervising Provider    Answer:   [name]     *If you need refills on other medications prior to your next appointment, please contact your pharmacy*  Follow-Up: Call back or seek an in-person evaluation if the symptoms worsen or if the condition fails to improve as anticipated.  [facility] [phone]  Other Instructions  Baloxavir Marboxil Tablets What is this medication? BALOXAVIR MARBOXIL (bah loks ah veer mar box ill) prevents and treats infections caused by the flu virus (influenza). It works by slowing the spread of the flu  virus in your body and reducing how long your symptoms last. It will not treat colds or infections caused by bacteria or other viruses. It will not replace the annual flu vaccine. This medicine may be used for other purposes; ask your health care provider or pharmacist if you have questions. COMMON BRAND NAME(S): Xofluza What should I tell my care team before I take this medication? They need to know if you have any of these conditions: Recent or upcoming vaccine An unusual or allergic reaction to baloxavir marboxil, other medications, foods, dyes, or preservatives Pregnant or trying to get  pregnant Breast-feeding How should I use this medication? Take this medication by mouth with water. Take it as directed on the prescription label. You can take it with or without food. If it upsets your stomach, take it with food. Take products with calcium, iron, magnesium, selenium, or zinc in them at a different time of day than this medication. Talk to your care team if you have questions. Talk to your care team about the use of this medication in children. While it may be given to children as young as 5 years for selected conditions, precautions do apply. Overdosage: If you think you have taken too much of this medicine contact a poison control center or emergency room at once. NOTE: This medicine is only for you. Do not share this medicine with others. What if I miss a dose? This does not apply. This medication is not for regular use. What may interact with this medication? Antacids, vitamins, or other products that contain calcium, iron, magnesium, selenium, or zinc Intranasal influenza vaccines This list may not describe all possible interactions. Give your health care provider a list of all the medicines, herbs, non-prescription drugs, or dietary supplements you use. Also tell them if you smoke, drink alcohol, or use illegal drugs. Some items may interact with your medicine. What should I watch for while using this medication? Visit your care team for regular checks on your progress. Tell your care team if your symptoms do not start to get better or if they get worse. This medication is not a substitute for the flu shot. Talk to your care team each year about an annual flu shot. What side effects may I notice from receiving this medication? Side effects that you should report to your care team as soon as possible: Allergic reactions--skin rash, itching, hives, swelling of the face, lips, tongue, or throat Side effects that usually do not require medical attention (report these to your  care team if they continue or are bothersome): Diarrhea Headache Nausea Vomiting This list may not describe all possible side effects. Call your doctor for medical advice about side effects. You may report side effects to FDA at 1-800-FDA-1088. Where should I keep my medication? Keep out of the reach of children and pets. Store between 20 and 25 degrees C (68 and 77 degrees F). Keep this medication in the original container. Get rid of any unused medication after the expiration date. To get rid of medications that are no longer needed or have expired: Take the medication to a medication take-back program. Check with your pharmacy or law enforcement to find a location. If you cannot return the medication, check the label or package insert to see if the medication should be thrown out in the garbage or flushed down the toilet. If you are not sure, ask your care team. If it is safe to put in the trash, pour the medication out of  the container. Mix the medication with cat litter, dirt, coffee grounds, or other unwanted substance. Seal the mixture in a bag or container. Put it in the trash. NOTE: This sheet is a summary. It may not cover all possible information. If you have questions about this medicine, talk to your doctor, pharmacist, or health care provider.  2023 Elsevier/Gold Standard (2021-01-14 00:00:00)    If you have been instructed to have an in-person evaluation today at a local Urgent Care facility, please use the link below. It will take you to a list of all of our available [facility], including address, phone number and hours of operation. Please do not delay care.  [facility]  If you or a family member do not have a primary care provider, use the link below to schedule a visit and establish care. When you choose a [facility] primary care physician or advanced practice provider, you gain a long-term partner in health. Find a Primary Care Provider  Learn  more about [facility]'s in-office and virtual care options: [facility]

## 2022-07-26 ENCOUNTER — Telehealth: Payer: Medicare Other | Admitting: Physician Assistant

## 2022-07-26 DIAGNOSIS — J208 Acute bronchitis due to other specified organisms: Secondary | ICD-10-CM | POA: Diagnosis not present

## 2022-07-26 DIAGNOSIS — B9689 Other specified bacterial agents as the cause of diseases classified elsewhere: Secondary | ICD-10-CM

## 2022-07-26 MED ORDER — AZITHROMYCIN 250 MG PO TABS: ORAL_TABLET | ORAL | 0 refills | Status: AC

## 2022-07-26 MED ORDER — PROMETHAZINE-DM 6.25-15 MG/5ML PO SYRP: 5.0000 mL | ORAL_SOLUTION | Freq: Four times a day (QID) | ORAL | 0 refills | Status: DC | PRN

## 2022-07-26 NOTE — Progress Notes (Signed)
Virtual Visit Consent   Bruce Morales, you are scheduled for a virtual visit with a [facility] provider today. Just as with appointments in the office, your consent must be obtained to participate. Your consent will be active for this visit and any virtual visit you may have with one of our providers in the next 365 days. If you have a MyChart account, a copy of this consent can be sent to you electronically.  As this is a virtual visit, video technology does not allow for your provider to perform a traditional examination. This may limit your provider's ability to fully assess your condition. If your provider identifies any concerns that need to be evaluated in person or the need to arrange testing (such as labs, EKG, etc.), we will make arrangements to do so. Although advances in technology are sophisticated, we cannot ensure that it will always work on either your end or our end. If the connection with a video visit is poor, the visit may have to be switched to a telephone visit. With either a video or telephone visit, we are not always able to ensure that we have a secure connection.  By engaging in this virtual visit, you consent to the provision of healthcare and authorize for your insurance to be billed (if applicable) for the services provided during this visit. Depending on your insurance coverage, you may receive a charge related to this service.  I need to obtain your verbal consent now. Are you willing to proceed with your visit today? Bruce Morales has provided verbal consent on 07/26/2022 for a virtual visit (video or telephone). Mar Daring, PA-C  Date: 07/26/2022 8:18 AM  Virtual Visit via Video Note   I, Mar Daring, connected with  Bruce Morales  ([id number], 1976-02-10) on 07/26/22 at  8:15 AM EST by a video-enabled telemedicine application and verified that I am speaking with the correct person using two identifiers.  Location: Patient: Virtual  Visit Location Patient: Home Provider: Virtual Visit Location Provider: Home Office   I discussed the limitations of evaluation and management by telemedicine and the availability of in person appointments. The patient expressed understanding and agreed to proceed.    History of Present Illness: Bruce Morales is a 47 y.o. who identifies as a male who was assigned male at birth, and is being seen today for cough.  HPI: Cough This is a new problem. The current episode started 1 to 4 weeks ago (Had influenza exposure and treatment on 07/12/22; cough has been progressively worsening since then). The problem has been gradually worsening. The cough is Productive of sputum, productive of purulent sputum and productive of brown sputum. Associated symptoms include chest pain (tightness), headaches, myalgias, nasal congestion and postnasal drip. Pertinent negatives include no chills, ear congestion, ear pain, fever, sore throat, shortness of breath, sweats, weight loss or wheezing. The symptoms are aggravated by lying down (deep breathing). He has tried OTC cough suppressant and prescription cough suppressant for the symptoms. The treatment provided no relief.     Problems:  Patient Active Problem List   Diagnosis Date Noted   Dyslipidemia 04/05/2022   Hepatic steatosis 09/30/2021   History of tobacco use 02/06/2020   Other hyperlipidemia 07/18/2018   Depression 07/18/2018   Prediabetes 05/17/2018   Vitamin D deficiency 05/17/2018   Hearing loss 09/04/2015   Thrombocytopenia ([facility])    Pancytopenia ([place]) 06/24/2015   Status post lumbar laminectomy 06/10/2015   Class 2 severe obesity with  serious comorbidity and body mass index (BMI) of 38.0 to 38.9 in adult [facility]) 11/13/2014    Allergies:  Allergies  Allergen Reactions   Other Hives and Itching   Hydrocodone Hives and Itching   Oxycodone     Mild Itching, patient can tolerate oxycodone   Percocet [Oxycodone-Acetaminophen]     Itching,  patient says he tolerates   Medications:  Current Outpatient Medications:    azithromycin (ZITHROMAX) 250 MG tablet, Take 2 tablets on day 1, then 1 tablet daily on days 2 through 5, Disp: 6 tablet, Rfl: 0   promethazine-dextromethorphan (PROMETHAZINE-DM) 6.25-15 MG/5ML syrup, Take 5 mLs by mouth 4 (four) times daily as needed., Disp: 118 mL, Rfl: 0   amoxicillin-clavulanate (AUGMENTIN) 875-125 MG tablet, Take 1 tablet by mouth 2 (two) times daily., Disp: 14 tablet, Rfl: 0   ascorbic acid (VITAMIN C) 1000 MG tablet, Take by mouth., Disp: , Rfl:    Chlorphen-PE-Acetaminophen 4-10-325 MG TABS, Take 1 tablet by mouth every 6 (six) hours as needed., Disp: 20 tablet, Rfl: 0   diltiazem (CARDIZEM) 30 MG tablet, TAKE 1 TABLET EVERY DAY AS NEEDED FOR RECTAL SPASM PAINS (Patient taking differently: Take 30 mg by mouth daily. TAKE 1 TABLET EVERY DAY), Disp: 90 tablet, Rfl: 1   GREEN TEA, CAMELLIA SINENSIS, PO, Take by mouth daily in the afternoon., Disp: , Rfl:    HYDROmorphone (DILAUDID) 4 MG tablet, Take 4 mg by mouth every 6 (six) hours as needed., Disp: , Rfl:    promethazine (PHENERGAN) 12.5 MG tablet, Take 1 tablet (12.5 mg total) by mouth every 6 (six) hours as needed for nausea or vomiting., Disp: 30 tablet, Rfl: 0   Semaglutide,0.25 or 0.'5MG'$ /DOS, (OZEMPIC, 0.25 OR 0.5 MG/DOSE,) 2 MG/1.5ML SOPN, See admin instructions., Disp: , Rfl:    tiZANidine (ZANAFLEX) 4 MG tablet, Take 1 tablet (4 mg total) by mouth every 8 (eight) hours as needed for muscle spasms., Disp: 30 tablet, Rfl: 1  Observations/Objective: Patient is well-developed, well-nourished in no acute distress.  Resting comfortably at home.  Head is normocephalic, atraumatic.  No labored breathing.  Speech is clear and coherent with logical content.  Patient is alert and oriented at baseline.    Assessment and Plan: 1. Acute bacterial bronchitis - azithromycin (ZITHROMAX) 250 MG tablet; Take 2 tablets on day 1, then 1 tablet daily on  days 2 through 5  Dispense: 6 tablet; Refill: 0 - promethazine-dextromethorphan (PROMETHAZINE-DM) 6.25-15 MG/5ML syrup; Take 5 mLs by mouth 4 (four) times daily as needed.  Dispense: 118 mL; Refill: 0  - Worsening over a week despite OTC medications - Will treat with Z-pack and Promethazine DM - Can continue Mucinex  - Push fluids.  - Rest.  - Steam and humidifier can help - Seek in person evaluation if worsening or symptoms fail to improve    Follow Up Instructions: I discussed the assessment and treatment plan with the patient. The patient was provided an opportunity to ask questions and all were answered. The patient agreed with the plan and demonstrated an understanding of the instructions.  A copy of instructions were sent to the patient via MyChart unless otherwise noted below.    The patient was advised to call back or seek an in-person evaluation if the symptoms worsen or if the condition fails to improve as anticipated.  Time:  I spent 10 minutes with the patient via telehealth technology discussing the above problems/concerns.    Mar Daring, PA-C

## 2022-07-26 NOTE — Patient Instructions (Signed)
Bruce Morales, thank you for joining Mar Daring, PA-C for today's virtual visit.  While this provider is not your primary care provider (PCP), if your PCP is located in our provider database this encounter information will be shared with them immediately following your visit.   [facility] [facility] account gives you access to today's visit and all your visits, tests, and labs performed at [facility] " click here if you don't have a [facility] account or go to [email or web address]  Consent: (Patient) Bruce Morales provided verbal consent for this virtual visit at the beginning of the encounter.  Current Medications:  Current Outpatient Medications:    azithromycin (ZITHROMAX) 250 MG tablet, Take 2 tablets on day 1, then 1 tablet daily on days 2 through 5, Disp: 6 tablet, Rfl: 0   promethazine-dextromethorphan (PROMETHAZINE-DM) 6.25-15 MG/5ML syrup, Take 5 mLs by mouth 4 (four) times daily as needed., Disp: 118 mL, Rfl: 0   amoxicillin-clavulanate (AUGMENTIN) 875-125 MG tablet, Take 1 tablet by mouth 2 (two) times daily., Disp: 14 tablet, Rfl: 0   ascorbic acid (VITAMIN C) 1000 MG tablet, Take by mouth., Disp: , Rfl:    Chlorphen-PE-Acetaminophen 4-10-325 MG TABS, Take 1 tablet by mouth every 6 (six) hours as needed., Disp: 20 tablet, Rfl: 0   diltiazem (CARDIZEM) 30 MG tablet, TAKE 1 TABLET EVERY DAY AS NEEDED FOR RECTAL SPASM PAINS (Patient taking differently: Take 30 mg by mouth daily. TAKE 1 TABLET EVERY DAY), Disp: 90 tablet, Rfl: 1   GREEN TEA, CAMELLIA SINENSIS, PO, Take by mouth daily in the afternoon., Disp: , Rfl:    HYDROmorphone (DILAUDID) 4 MG tablet, Take 4 mg by mouth every 6 (six) hours as needed., Disp: , Rfl:    promethazine (PHENERGAN) 12.5 MG tablet, Take 1 tablet (12.5 mg total) by mouth every 6 (six) hours as needed for nausea or vomiting., Disp: 30 tablet, Rfl: 0   Semaglutide,0.25 or 0.'5MG'$ /DOS, (OZEMPIC, 0.25 OR 0.5 MG/DOSE,) 2  MG/1.5ML SOPN, See admin instructions., Disp: , Rfl:    tiZANidine (ZANAFLEX) 4 MG tablet, Take 1 tablet (4 mg total) by mouth every 8 (eight) hours as needed for muscle spasms., Disp: 30 tablet, Rfl: 1   Medications ordered in this encounter:  Meds ordered this encounter  Medications   azithromycin (ZITHROMAX) 250 MG tablet    Sig: Take 2 tablets on day 1, then 1 tablet daily on days 2 through 5    Dispense:  6 tablet    Refill:  0    Order Specific Question:   Supervising Provider    Answer:   Chase Picket [id number]   promethazine-dextromethorphan (PROMETHAZINE-DM) 6.25-15 MG/5ML syrup    Sig: Take 5 mLs by mouth 4 (four) times daily as needed.    Dispense:  118 mL    Refill:  0    Order Specific Question:   Supervising Provider    Answer:   Chase Picket [id number]     *If you need refills on other medications prior to your next appointment, please contact your pharmacy*  Follow-Up: Call back or seek an in-person evaluation if the symptoms worsen or if the condition fails to improve as anticipated.  [place] [phone]  Other Instructions  Acute Bronchitis, Adult  Acute bronchitis is sudden inflammation of the main airways (bronchi) that come off the windpipe (trachea) in the lungs. The swelling causes the airways to get smaller and make more mucus than normal. This can  make it hard to breathe and can cause coughing or noisy breathing (wheezing). Acute bronchitis may last several weeks. The cough may last longer. Allergies, asthma, and exposure to smoke may make the condition worse. What are the causes? This condition can be caused by germs and by substances that irritate the lungs, including: Cold and flu viruses. The most common cause of this condition is the virus that causes the common cold. Bacteria. This is less common. Breathing in substances that irritate the lungs, including: Smoke from cigarettes and other forms of tobacco. Dust and  pollen. Fumes from household cleaning products, gases, or burned fuel. Indoor or outdoor air pollution. What increases the risk? The following factors may make you more likely to develop this condition: A weak body's defense system, also called the immune system. A condition that affects your lungs and breathing, such as asthma. What are the signs or symptoms? Common symptoms of this condition include: Coughing. This may bring up clear, yellow, or green mucus from your lungs (sputum). Wheezing. Runny or stuffy nose. Having too much mucus in your lungs (chest congestion). Shortness of breath. Aches and pains, including sore throat or chest. How is this diagnosed? This condition is usually diagnosed based on: Your symptoms and medical history. A physical exam. You may also have other tests, including tests to rule out other conditions, such as pneumonia. These tests include: A test of lung function. Test of a mucus sample to look for the presence of bacteria. Tests to check the oxygen level in your blood. Blood tests. Chest X-ray. How is this treated? Most cases of acute bronchitis clear up over time without treatment. Your health care provider may recommend: Drinking more fluids to help thin your mucus so it is easier to cough up. Taking inhaled medicine (inhaler) to improve air flow in and out of your lungs. Using a vaporizer or a humidifier. These are machines that add water to the air to help you breathe better. Taking a medicine that thins mucus and clears congestion (expectorant). Taking a medicine that prevents or stops coughing (cough suppressant). It is not common to take an antibiotic medicine for this condition. Follow these instructions at home:  Take over-the-counter and prescription medicines only as told by your health care provider. Use an inhaler, vaporizer, or humidifier as told by your health care provider. Take two teaspoons (10 mL) of honey at bedtime to lessen  coughing at night. Drink enough fluid to keep your urine pale yellow. Do not use any products that contain nicotine or tobacco. These products include cigarettes, chewing tobacco, and vaping devices, such as e-cigarettes. If you need help quitting, ask your health care provider. Get plenty of rest. Return to your normal activities as told by your health care provider. Ask your health care provider what activities are safe for you. Keep all follow-up visits. This is important. How is this prevented? To lower your risk of getting this condition again: Wash your hands often with soap and water for at least 20 seconds. If soap and water are not available, use hand sanitizer. Avoid contact with people who have cold symptoms. Try not to touch your mouth, nose, or eyes with your hands. Avoid breathing in smoke or chemical fumes. Breathing smoke or chemical fumes will make your condition worse. Get the flu shot every year. Contact a health care provider if: Your symptoms do not improve after 2 weeks. You have trouble coughing up the mucus. Your cough keeps you awake at night. You  have a fever. Get help right away if you: Cough up blood. Feel pain in your chest. Have severe shortness of breath. Faint or keep feeling like you are going to faint. Have a severe headache. Have a fever or chills that get worse. These symptoms may represent a serious problem that is an emergency. Do not wait to see if the symptoms will go away. Get medical help right away. Call your local emergency services (911 in the [place] Do not drive yourself to the hospital. Summary Acute bronchitis is inflammation of the main airways (bronchi) that come off the windpipe (trachea) in the lungs. The swelling causes the airways to get smaller and make more mucus than normal. Drinking more fluids can help thin your mucus so it is easier to cough up. Take over-the-counter and prescription medicines only as told by your health care  provider. Do not use any products that contain nicotine or tobacco. These products include cigarettes, chewing tobacco, and vaping devices, such as e-cigarettes. If you need help quitting, ask your health care provider. Contact a health care provider if your symptoms do not improve after 2 weeks. This information is not intended to replace advice given to you by your health care provider. Make sure you discuss any questions you have with your health care provider. Document Revised: 08/27/2021 Document Reviewed: 09/17/2020 Elsevier Patient Education  [place].    If you have been instructed to have an in-person evaluation today at a local Urgent Care facility, please use the link below. It will take you to a list of all of our available [facility], including address, phone number and hours of operation. Please do not delay care.  [facility]  If you or a family member do not have a primary care provider, use the link below to schedule a visit and establish care. When you choose a [facility] primary care physician or advanced practice provider, you gain a long-term partner in health. Find a Primary Care Provider  Learn more about [facility]'s in-office and virtual care options: [facility]

## 2022-08-06 ENCOUNTER — Ambulatory Visit (HOSPITAL_BASED_OUTPATIENT_CLINIC_OR_DEPARTMENT_OTHER): Payer: Medicare Other | Admitting: Family

## 2022-08-24 NOTE — Progress Notes (Unsigned)
Cardiology Office Note:    Date:  12/23/2021   ID:  Bruce Morales, DOB 07-10-1975, MRN [id number]  PCP:  Horald Pollen, MD  Cardiologist:  Donato Heinz, MD  Electrophysiologist:  None   Referring MD: Horald Pollen, *   Chief Complaint  Patient presents with   Follow-up   Coronary Artery Disease    History of Present Illness:    Bruce Morales is a 47 y.o. male with a hx of hyperlipidemia, tobacco use, hypertension who presents for follow-up.  He was referred by Dr. Mitchel Honour for evaluation of chest pain, initially seen on 08/20/2018.  Seen in the ED on 08/16/2021 with chest pain.  Troponins negative x2.  He reports that he was having intermittent chest pain for one week prior to ED visit.  Reports pain across entire chest, starts off feeling warm and then sharp pain.  Occurred at rest.  He does not exercise due to back pain.  Pain will last for about 5 minutes or so and resolve.  He then woke up on the morning of 3/19 with chest pain.  Pain lasted for 15 to 20 minutes and prompted him to go to the ED.  He reports since that time he has had mild chest soreness.  Also reports dyspnea with minimal exertion.  Reports occasional lightheadedness but denies any syncope.  Reports mild lower extremity edema.  Reports he gets pain in the back of his legs when he walks.  Denies any palpitations.  He smoked 1.5 to 2 packs/day, smoked for almost 30 years but quit in 2016.  Family history includes paternal grandfather had heart failure and maternal grandfather had CABG.  Calcium score on 12/19/2020 was 6 (80th percentile).  Echocardiogram 08/26/2021 was technically difficult study but grossly normal biventricular function and no significant valvular disease.  Coronary CTA on 09/15/2021 showed minimal nonobstructive CAD (calcium score 12, 83rd percentile).  Since last clinic visit,  he reports that he has been doing well.  Has lost almost 40 pounds.  Walking 6 to 8 miles per  day.  Denies any exertional symptoms.  Denies any chest pain, dyspnea, lightheadedness, or syncope.  Reports occasional lower extremity edema.     Wt Readings from Last 3 Encounters:  12/23/21 231 lb 6.4 oz (105 kg)  09/30/21 266 lb 8 oz (120.9 kg)  08/19/21 265 lb 3.2 oz (120.3 kg)      Past Medical History:  Diagnosis Date   Anginal pain ([place]) 04/15/2014   Arthritis    "wrists, knees, lower back" (04/16/2014)   Carpal tunnel syndrome    Chronic bronchitis ([place])    "probably get it q yr"   Chronic lower back pain    Chronic lower back pain    Constipation    Gall stones    GERD (gastroesophageal reflux disease)    GERD (gastroesophageal reflux disease)    HLD (hyperlipidemia)    Hyperlipidemia    Phreesia 04/14/2020   Joint pain    Meningitis    Plantar fasciitis    Right knee pain    Sciatica     Past Surgical History:  Procedure Laterality Date   CARPAL TUNNEL RELEASE     02/2018 Right arm   CHOLECYSTECTOMY  2011   ESOPHAGOGASTRODUODENOSCOPY N/A 02/27/2013   Procedure: ESOPHAGOGASTRODUODENOSCOPY (EGD);  Surgeon: Cleotis Nipper, MD;  Location: [facility];  Service: Endoscopy;  Laterality: N/A;   KNEE ARTHROSCOPY Right 2013 X 2   KNEE ARTHROSCOPY Right 02/22/2018  LAMINECTOMY AND MICRODISCECTOMY SPINE     LEFT HEART CATHETERIZATION WITH CORONARY ANGIOGRAM N/A 04/17/2014   Procedure: LEFT HEART CATHETERIZATION WITH CORONARY ANGIOGRAM;  Surgeon: Birdie Riddle, MD;  Location: [facility];  Service: Cardiovascular;  Laterality: N/A;   RADIOLOGY WITH ANESTHESIA N/A 06/11/2015   Procedure: MRI LUMBAR WITH AND WITHOUT CONTRAST;  Surgeon: Medication Radiologist, MD;  Location: [facility];  Service: Radiology;  Laterality: N/A;   SPINE SURGERY N/A    Phreesia 04/14/2020    Current Medications: Current Meds  Medication Sig   ascorbic acid (VITAMIN C) 1000 MG tablet Take by mouth.   CLOMID 50 MG tablet Take 25 mg by mouth daily.   diltiazem (CARDIZEM) 30 MG tablet TAKE  1 TABLET EVERY DAY AS NEEDED FOR RECTAL SPASM PAINS (Patient taking differently: Take 30 mg by mouth daily. TAKE 1 TABLET EVERY DAY)   GREEN TEA, CAMELLIA SINENSIS, PO Take by mouth daily in the afternoon.   HYDROmorphone (DILAUDID) 4 MG tablet Take 4 mg by mouth every 6 (six) hours as needed.   rosuvastatin (CRESTOR) 10 MG tablet Take 1 tablet (10 mg total) by mouth daily.   Semaglutide,0.25 or 0.5MG /DOS, (OZEMPIC, 0.25 OR 0.5 MG/DOSE,) 2 MG/1.5ML SOPN See admin instructions.   tiZANidine (ZANAFLEX) 4 MG tablet Take 1 tablet (4 mg total) by mouth every 8 (eight) hours as needed for muscle spasms.     Allergies:   Other, Hydrocodone, Oxycodone, and Percocet [oxycodone-acetaminophen]   Social History   Socioeconomic History   Marital status: Married    Spouse name: Delcie Roch    Number of children: 2   Years of education: Not on file   Highest education level: Not on file  Occupational History   Occupation: Disabled  Tobacco Use   Smoking status: Former    Packs/day: 0.50    Years: 27.00    Total pack years: 13.50    Types: Cigarettes    Quit date: 03/11/2015    Years since quitting: 6.7   Smokeless tobacco: Former  Scientific laboratory technician Use: Never used  Substance and Sexual Activity   Alcohol use: Yes   Drug use: No    Comment: 04/16/2014 "in my teens"   Sexual activity: Yes  Other Topics Concern   Not on file  Social History Narrative   Drinks 2 cups of coffee in the morning.   Social Determinants of Health   Financial Resource Strain: Not on file  Food Insecurity: Not on file  Transportation Needs: Not on file  Physical Activity: Not on file  Stress: Not on file  Social Connections: Not on file     Family History: The patient's family history includes Diabetes in his father and paternal grandmother; Fibromyalgia in his sister; Heart failure in his maternal grandfather; Hyperlipidemia in his father; Multiple sclerosis in his mother; Obesity in his father and mother;  Schizophrenia in his brother; Sleep apnea in his father; Stroke in his maternal grandmother.  ROS:   Please see the history of present illness.     All other systems reviewed and are negative.  EKGs/Labs/Other Studies Reviewed:    The following studies were reviewed today:   EKG:   08/16/2021: Normal sinus rhythm, first-degree AV block, low voltage, rate 61   Recent Labs: 08/16/2021: Hemoglobin 14.9; Platelets 120 09/30/2021: ALT 57; BUN 21; Creatinine, Ser 1.15; Potassium 4.8; Sodium 138  Recent Lipid Panel    Component Value Date/Time   CHOL 220 (H) 09/30/2021 [id number]  CHOL 278 (H) 04/17/2020 1042   TRIG 136.0 09/30/2021 0839   HDL 34.30 (L) 09/30/2021 0839   HDL 37 (L) 04/17/2020 1042   CHOLHDL 6 09/30/2021 0839   VLDL 27.2 09/30/2021 0839   LDLCALC 158 (H) 09/30/2021 0839   LDLCALC 224 (H) 04/17/2020 1042    Physical Exam:    VS:  BP 122/76   Pulse 72   Ht 5\' 8"  (1.727 m)   Wt 231 lb 6.4 oz (105 kg)   SpO2 97%   BMI 35.18 kg/m     Wt Readings from Last 3 Encounters:  12/23/21 231 lb 6.4 oz (105 kg)  09/30/21 266 lb 8 oz (120.9 kg)  08/19/21 265 lb 3.2 oz (120.3 kg)     GEN:  Well nourished, well developed in no acute distress HEENT: Normal NECK: No JVD; No carotid bruits LYMPHATICS: No lymphadenopathy CARDIAC: RRR, no murmurs, rubs, gallops RESPIRATORY:  Clear to auscultation without rales, wheezing or rhonchi  ABDOMEN: Soft, non-tender, non-distended MUSCULOSKELETAL:  No edema; No deformity  SKIN: Warm and dry NEUROLOGIC:  Alert and oriented x 3 PSYCHIATRIC:  Normal affect   ASSESSMENT:    1. Coronary artery disease involving native coronary artery of native heart without angina pectoris   2. Chest pain of uncertain etiology   3. Hyperlipidemia, unspecified hyperlipidemia type     PLAN:    CAD: Seen in the ED on 08/16/2021 with chest pain.  Troponins negative x2.  Calcium score on 12/19/2020 was 6 (80th percentile).  Description of chest pain is  atypical.  Echocardiogram 08/26/2021 was technically difficult study but grossly normal biventricular function and no significant valvular disease.  Coronary CTA on 09/15/2021 showed minimal nonobstructive CAD (calcium score 12, 83rd percentile). -Continue rosuvastatin 10 mg daily  Hyperlipidemia: LDL 170 on 12/15/2020.  Started rosuvastatin 10 mg daily 07/2021.  LDL 61 11/2021.  Leg pain: Normal ABIs 09/16/2021.  Obesity: on Ozempic.  Exercising daily and has changed his diet, has lost 40 lbs.    Snoring/daytime somnolence: Recommend sleep study.  Patient states that he will consider  RTC in 1 year   Medication Adjustments/Labs and Tests Ordered: Current medicines are reviewed at length with the patient today.  Concerns regarding medicines are outlined above.  No orders of the defined types were placed in this encounter.  No orders of the defined types were placed in this encounter.   Patient Instructions  Medication Instructions:  Your physician recommends that you continue on your current medications as directed. Please refer to the Current Medication list given to you today.  *If you need a refill on your cardiac medications before your next appointment, please call your pharmacy*  Follow-Up: At [facility], you and your health needs are our priority.  As part of our continuing mission to provide you with exceptional heart care, we have created designated Provider Care Teams.  These Care Teams include your primary Cardiologist (physician) and Advanced Practice Providers (APPs -  Physician Assistants and Nurse Practitioners) who all work together to provide you with the care you need, when you need it.  We recommend signing up for the patient portal called "MyChart".  Sign up information is provided on this After Visit Summary.  MyChart is used to connect with patients for Virtual Visits (Telemedicine).  Patients are able to view lab/test results, encounter notes, upcoming appointments,  etc.  Non-urgent messages can be sent to your provider as well.   To learn more about what you can do  with MyChart, go to [email or web address].    Your next appointment:   12 month(s)  The format for your next appointment:   In Person  Provider:   Donato Heinz, MD {    Important Information About Sugar         Signed, Donato Heinz, MD  12/23/2021 9:53 AM    [place]

## 2022-08-26 ENCOUNTER — Ambulatory Visit (HOSPITAL_BASED_OUTPATIENT_CLINIC_OR_DEPARTMENT_OTHER): Payer: Medicare Other | Admitting: Cardiology

## 2022-08-26 ENCOUNTER — Encounter (HOSPITAL_BASED_OUTPATIENT_CLINIC_OR_DEPARTMENT_OTHER): Payer: Self-pay | Admitting: Cardiology

## 2022-08-26 VITALS — BP 122/80 | HR 79 | Ht 68.0 in | Wt 219.8 lb

## 2022-08-26 DIAGNOSIS — E785 Hyperlipidemia, unspecified: Secondary | ICD-10-CM | POA: Diagnosis not present

## 2022-08-26 DIAGNOSIS — I2510 Atherosclerotic heart disease of native coronary artery without angina pectoris: Secondary | ICD-10-CM | POA: Diagnosis not present

## 2022-08-26 DIAGNOSIS — E669 Obesity, unspecified: Secondary | ICD-10-CM

## 2022-08-26 NOTE — Patient Instructions (Signed)
Medication Instructions:  Your physician recommends that you continue on your current medications as directed. Please refer to the Current Medication list given to you today.  *If you need a refill on your cardiac medications before your next appointment, please call your pharmacy*  Lab Work: Lipid today  If you have labs (blood work) drawn today and your tests are completely normal, you will receive your results only by: [facility] (if you have MyChart) OR A paper copy in the mail If you have any lab test that is abnormal or we need to change your treatment, we will call you to review the results.  Follow-Up: At [facility], you and your health needs are our priority.  As part of our continuing mission to provide you with exceptional heart care, we have created designated Provider Care Teams.  These Care Teams include your primary Cardiologist (physician) and Advanced Practice Providers (APPs -  Physician Assistants and Nurse Practitioners) who all work together to provide you with the care you need, when you need it.  We recommend signing up for the patient portal called "MyChart".  Sign up information is provided on this After Visit Summary.  MyChart is used to connect with patients for Virtual Visits (Telemedicine).  Patients are able to view lab/test results, encounter notes, upcoming appointments, etc.  Non-urgent messages can be sent to your provider as well.   To learn more about what you can do with MyChart, go to [email or web address].    Your next appointment:   12 month(s)  Provider:   Donato Heinz, MD

## 2022-08-27 ENCOUNTER — Other Ambulatory Visit: Payer: Self-pay

## 2022-08-27 DIAGNOSIS — E785 Hyperlipidemia, unspecified: Secondary | ICD-10-CM

## 2022-08-27 LAB — LIPID PANEL
Chol/HDL Ratio: 5.2 ratio — ABNORMAL HIGH (ref 0.0–5.0)
Cholesterol, Total: 222 mg/dL — ABNORMAL HIGH (ref 100–199)
HDL: 43 mg/dL
LDL Chol Calc (NIH): 153 mg/dL — ABNORMAL HIGH (ref 0–99)
Triglycerides: 143 mg/dL (ref 0–149)
VLDL Cholesterol Cal: 26 mg/dL (ref 5–40)

## 2022-08-27 MED ORDER — ROSUVASTATIN CALCIUM 10 MG PO TABS: 10.0000 mg | ORAL_TABLET | Freq: Every day | ORAL | 3 refills | Status: AC

## 2022-10-05 ENCOUNTER — Encounter: Payer: Self-pay | Admitting: Emergency Medicine

## 2022-10-05 ENCOUNTER — Ambulatory Visit (INDEPENDENT_AMBULATORY_CARE_PROVIDER_SITE_OTHER): Payer: Medicare Other | Admitting: Emergency Medicine

## 2022-10-05 VITALS — BP 128/84 | HR 65 | Temp 98.1°F | Ht 68.0 in | Wt 211.5 lb

## 2022-10-05 DIAGNOSIS — E559 Vitamin D deficiency, unspecified: Secondary | ICD-10-CM | POA: Diagnosis not present

## 2022-10-05 DIAGNOSIS — M489 Spondylopathy, unspecified: Secondary | ICD-10-CM

## 2022-10-05 DIAGNOSIS — R7303 Prediabetes: Secondary | ICD-10-CM

## 2022-10-05 DIAGNOSIS — K760 Fatty (change of) liver, not elsewhere classified: Secondary | ICD-10-CM | POA: Diagnosis not present

## 2022-10-05 DIAGNOSIS — M62838 Other muscle spasm: Secondary | ICD-10-CM | POA: Diagnosis not present

## 2022-10-05 DIAGNOSIS — D61818 Other pancytopenia: Secondary | ICD-10-CM

## 2022-10-05 DIAGNOSIS — E785 Hyperlipidemia, unspecified: Secondary | ICD-10-CM

## 2022-10-05 DIAGNOSIS — H9193 Unspecified hearing loss, bilateral: Secondary | ICD-10-CM | POA: Diagnosis not present

## 2022-10-05 DIAGNOSIS — D696 Thrombocytopenia, unspecified: Secondary | ICD-10-CM | POA: Diagnosis not present

## 2022-10-05 DIAGNOSIS — Z6838 Body mass index (BMI) 38.0-38.9, adult: Secondary | ICD-10-CM

## 2022-10-05 LAB — CBC WITH DIFFERENTIAL/PLATELET
Basophils Absolute: 0 10*3/uL (ref 0.0–0.1)
Basophils Relative: 0.7 % (ref 0.0–3.0)
Eosinophils Absolute: 0.1 10*3/uL (ref 0.0–0.7)
Eosinophils Relative: 1.9 % (ref 0.0–5.0)
HCT: 44.2 % (ref 39.0–52.0)
Hemoglobin: 15.1 g/dL (ref 13.0–17.0)
Lymphocytes Relative: 40.3 % (ref 12.0–46.0)
Lymphs Abs: 1.9 10*3/uL (ref 0.7–4.0)
MCHC: 34.1 g/dL (ref 30.0–36.0)
MCV: 88.7 fl (ref 78.0–100.0)
Monocytes Absolute: 0.4 10*3/uL (ref 0.1–1.0)
Monocytes Relative: 8.6 % (ref 3.0–12.0)
Neutro Abs: 2.4 10*3/uL (ref 1.4–7.7)
Neutrophils Relative %: 48.5 % (ref 43.0–77.0)
Platelets: 135 10*3/uL — ABNORMAL LOW (ref 150.0–400.0)
RBC: 4.98 Mil/uL (ref 4.22–5.81)
RDW: 12.6 % (ref 11.5–15.5)
WBC: 4.8 10*3/uL (ref 4.0–10.5)

## 2022-10-05 LAB — VITAMIN D 25 HYDROXY (VIT D DEFICIENCY, FRACTURES): VITD: 36.58 ng/mL (ref 30.00–100.00)

## 2022-10-05 LAB — COMPREHENSIVE METABOLIC PANEL
ALT: 23 U/L (ref 0–53)
AST: 19 U/L (ref 0–37)
Albumin: 4.5 g/dL (ref 3.5–5.2)
Alkaline Phosphatase: 50 U/L (ref 39–117)
BUN: 18 mg/dL (ref 6–23)
CO2: 27 mEq/L (ref 19–32)
Calcium: 9.3 mg/dL (ref 8.4–10.5)
Chloride: 107 mEq/L (ref 96–112)
Creatinine, Ser: 0.97 mg/dL (ref 0.40–1.50)
GFR: 94.91 mL/min (ref >60.00)
Glucose, Bld: 91 mg/dL (ref 70–99)
Potassium: 4.2 mEq/L (ref 3.5–5.1)
Sodium: 141 mEq/L (ref 135–145)
Total Bilirubin: 0.7 mg/dL (ref 0.2–1.2)
Total Protein: 6.9 g/dL (ref 6.0–8.3)

## 2022-10-05 LAB — LIPID PANEL
Cholesterol: 184 mg/dL (ref 0–200)
HDL: 33.7 mg/dL — ABNORMAL LOW (ref >39.00)
NonHDL: 150.78
Total CHOL/HDL Ratio: 5
Triglycerides: 203 mg/dL — ABNORMAL HIGH (ref 0.0–149.0)
VLDL: 40.6 mg/dL — ABNORMAL HIGH (ref 0.0–40.0)

## 2022-10-05 LAB — LDL CHOLESTEROL, DIRECT: Direct LDL: 140 mg/dL

## 2022-10-05 LAB — HEMOGLOBIN A1C: Hgb A1c MFr Bld: 5.3 % (ref 4.6–6.5)

## 2022-10-05 MED ORDER — CYCLOBENZAPRINE HCL 10 MG PO TABS: 10.0000 mg | ORAL_TABLET | Freq: Three times a day (TID) | ORAL | 3 refills | Status: DC | PRN

## 2022-10-05 NOTE — Assessment & Plan Note (Signed)
Eating better and exercising more Advised to decrease amount of daily carbohydrate intake and daily calories and increase amount of plant-based protein in his diet Benefits of exercise discussed.

## 2022-10-05 NOTE — Assessment & Plan Note (Signed)
Bilateral and intermittent Affecting quality of life Needs ENT evaluation Referral placed today

## 2022-10-05 NOTE — Assessment & Plan Note (Signed)
Chronic.  Stable.  No concerns. CBC done today

## 2022-10-05 NOTE — Assessment & Plan Note (Signed)
Chronic No clinical bleeding episodes CBC done today

## 2022-10-05 NOTE — Assessment & Plan Note (Signed)
Pain management discussed. Stop Zanaflex and start Flexeril 10 mg every 8 hours as needed

## 2022-10-05 NOTE — Assessment & Plan Note (Signed)
Has been evaluated by a neurosurgeon in the past. Does not wish to have surgery at present time Pain management discussed.  Well-controlled. Flexeril helps Recommend 10 mg every 6-8 hours as needed

## 2022-10-05 NOTE — Patient Instructions (Signed)
Health Maintenance, Male Adopting a healthy lifestyle and getting preventive care are important in promoting health and wellness. Ask your health care provider about: The right schedule for you to have regular tests and exams. Things you can do on your own to prevent diseases and keep yourself healthy. What should I know about diet, weight, and exercise? Eat a healthy diet  Eat a diet that includes plenty of vegetables, fruits, low-fat dairy products, and lean protein. Do not eat a lot of foods that are high in solid fats, added sugars, or sodium. Maintain a healthy weight Body mass index (BMI) is a measurement that can be used to identify possible weight problems. It estimates body fat based on height and weight. Your health care provider can help determine your BMI and help you achieve or maintain a healthy weight. Get regular exercise Get regular exercise. This is one of the most important things you can do for your health. Most adults should: Exercise for at least 150 minutes each week. The exercise should increase your heart rate and make you sweat (moderate-intensity exercise). Do strengthening exercises at least twice a week. This is in addition to the moderate-intensity exercise. Spend less time sitting. Even light physical activity can be beneficial. Watch cholesterol and blood lipids Have your blood tested for lipids and cholesterol at 45 years of age, then have this test every 5 years. You may need to have your cholesterol levels checked more often if: Your lipid or cholesterol levels are high. You are older than 45 years of age. You are at high risk for heart disease. What should I know about cancer screening? Many types of cancers can be detected early and may often be prevented. Depending on your health history and family history, you may need to have cancer screening at various ages. This may include screening for: Colorectal cancer. Prostate cancer. Skin cancer. Lung  cancer. What should I know about heart disease, diabetes, and high blood pressure? Blood pressure and heart disease High blood pressure causes heart disease and increases the risk of stroke. This is more likely to develop in people who have high blood pressure readings or are overweight. Talk with your health care provider about your target blood pressure readings. Have your blood pressure checked: Every 3-5 years if you are 18-39 years of age. Every year if you are 40 years old or older. If you are between the ages of 65 and 75 and are a current or former smoker, ask your health care provider if you should have a one-time screening for abdominal aortic aneurysm (AAA). Diabetes Have regular diabetes screenings. This checks your fasting blood sugar level. Have the screening done: Once every three years after age 45 if you are at a normal weight and have a low risk for diabetes. More often and at a younger age if you are overweight or have a high risk for diabetes. What should I know about preventing infection? Hepatitis B If you have a higher risk for hepatitis B, you should be screened for this virus. Talk with your health care provider to find out if you are at risk for hepatitis B infection. Hepatitis C Blood testing is recommended for: Everyone born from 1945 through 1965. Anyone with known risk factors for hepatitis C. Sexually transmitted infections (STIs) You should be screened each year for STIs, including gonorrhea and chlamydia, if: You are sexually active and are younger than 45 years of age. You are older than 45 years of age and your   health care provider tells you that you are at risk for this type of infection. Your sexual activity has changed since you were last screened, and you are at increased risk for chlamydia or gonorrhea. Ask your health care provider if you are at risk. Ask your health care provider about whether you are at high risk for HIV. Your health care provider  may recommend a prescription medicine to help prevent HIV infection. If you choose to take medicine to prevent HIV, you should first get tested for HIV. You should then be tested every 3 months for as long as you are taking the medicine. Follow these instructions at home: Alcohol use Do not drink alcohol if your health care provider tells you not to drink. If you drink alcohol: Limit how much you have to 0-2 drinks a day. Know how much alcohol is in your drink. In the [place] one drink equals one 12 oz bottle of beer (355 mL), one 5 oz glass of wine (148 mL), or one 1 oz glass of hard liquor (44 mL). Lifestyle Do not use any products that contain nicotine or tobacco. These products include cigarettes, chewing tobacco, and vaping devices, such as e-cigarettes. If you need help quitting, ask your health care provider. Do not use street drugs. Do not share needles. Ask your health care provider for help if you need support or information about quitting drugs. General instructions Schedule regular health, dental, and eye exams. Stay current with your vaccines. Tell your health care provider if: You often feel depressed. You have ever been abused or do not feel safe at home. Summary Adopting a healthy lifestyle and getting preventive care are important in promoting health and wellness. Follow your health care provider's instructions about healthy diet, exercising, and getting tested or screened for diseases. Follow your health care provider's instructions on monitoring your cholesterol and blood pressure. This information is not intended to replace advice given to you by your health care provider. Make sure you discuss any questions you have with your health care provider. Document Revised: 10/06/2020 Document Reviewed: 10/06/2020 Elsevier Patient Education  2023 Elsevier Inc.  

## 2022-10-05 NOTE — Assessment & Plan Note (Signed)
Stable chronic condition Lipid profile done today Diet and nutrition discussed Continues rosuvastatin 10 mg daily

## 2022-10-05 NOTE — Assessment & Plan Note (Addendum)
Stable.  Hemoglobin A1c done today Diet and nutrition discussed. Continues weekly Ozempic 0.5 mg

## 2022-10-05 NOTE — Progress Notes (Signed)
[name] 47 y.o.   Chief Complaint  Patient presents with   Medical Management of Chronic Issues    f/u appt, patient states the disk in his neck is getting worse, muscle spasm and pain, patient is taking Tizanidine and wants to start back on Flexeril if poss.    Ear Pain    Left ear pain , having some issues with hearing     HISTORY OF PRESENT ILLNESS: This is a 47 y.o. male here for follow-up of chronic medical conditions Overall doing well.  Eating better and exercising more. Occasionally smokes cigars. Has history of cervical spine disease.  Flexeril works well for him.  Requesting refill. Also complaining of left ear pain and chronic hearing issues  BP Readings from Last 3 Encounters:  08/26/22 122/80  04/05/22 112/76  03/03/22 122/74   Wt Readings from Last 3 Encounters:  10/05/22 211 lb 8 oz (95.9 kg)  08/26/22 219 lb 12.8 oz (99.7 kg)  04/05/22 222 lb 4 oz (100.8 kg)   Lab Results  Component Value Date   HGBA1C 5.8 09/30/2021     HPI   Prior to Admission medications   Medication Sig Start Date End Date Taking? Authorizing Provider  ascorbic acid (VITAMIN C) 1000 MG tablet Take by mouth.   Yes [provider]  Chlorphen-PE-Acetaminophen 4-10-325 MG TABS Take 1 tablet by mouth every 6 (six) hours as needed. 06/23/22  Yes Martin, Mary-Margaret, FNP  diltiazem (CARDIZEM) 30 MG tablet TAKE 1 TABLET EVERY DAY AS NEEDED FOR RECTAL SPASM PAINS Patient taking differently: Take 30 mg by mouth daily. TAKE 1 TABLET EVERY DAY 07/09/20  Yes Keeshia Sanderlin, [place], MD  GREEN TEA, CAMELLIA SINENSIS, PO Take by mouth daily in the afternoon.   Yes [provider]  promethazine-dextromethorphan (PROMETHAZINE-DM) 6.25-15 MG/5ML syrup Take 5 mLs by mouth 4 (four) times daily as needed. 07/26/22  Yes Margaretann Loveless, PA-C  rosuvastatin (CRESTOR) 10 MG tablet Take 1 tablet (10 mg total) by mouth daily. 08/27/22  Yes Little Ishikawa, MD   Semaglutide,0.25 or 0.5MG /DOS, (OZEMPIC, 0.25 OR 0.5 MG/DOSE,) 2 MG/1.5ML SOPN See admin instructions. 09/14/21  Yes [provider]  tiZANidine (ZANAFLEX) 4 MG tablet Take 1 tablet (4 mg total) by mouth every 8 (eight) hours as needed for muscle spasms. 04/05/22  Yes Bela Nyborg, [name], MD  HYDROmorphone (DILAUDID) 4 MG tablet Take 4 mg by mouth every 6 (six) hours as needed. Patient not taking: Reported on 10/05/2022 07/28/21   [provider]    Allergies  Allergen Reactions   Other Hives and Itching   Hydrocodone Hives and Itching   Oxycodone     Mild Itching, patient can tolerate oxycodone   Percocet [Oxycodone-Acetaminophen]     Itching, patient says he tolerates    Patient Active Problem List   Diagnosis Date Noted   Dyslipidemia 04/05/2022   Hepatic steatosis 09/30/2021   History of tobacco use 02/06/2020   Other hyperlipidemia 07/18/2018   Depression 07/18/2018   Prediabetes 05/17/2018   Vitamin D deficiency 05/17/2018   Hearing loss 09/04/2015   Thrombocytopenia (HCC)    Pancytopenia (HCC) 06/24/2015   Status post lumbar laminectomy 06/10/2015   Class 2 severe obesity with serious comorbidity and body mass index (BMI) of 38.0 to 38.9 in adult [facility]) 11/13/2014    Past Medical History:  Diagnosis Date   Anginal pain (HCC) 04/15/2014   Arthritis    "wrists, knees, lower back" (04/16/2014)   Carpal tunnel  syndrome    Chronic bronchitis (HCC)    "probably get it q yr"   Chronic lower back pain    Chronic lower back pain    Constipation    Gall stones    GERD (gastroesophageal reflux disease)    GERD (gastroesophageal reflux disease)    HLD (hyperlipidemia)    Hyperlipidemia    Phreesia 04/14/2020   Joint pain    Meningitis    Plantar fasciitis    Right knee pain    Sciatica     Past Surgical History:  Procedure Laterality Date   CARPAL TUNNEL RELEASE     02/2018 Right arm   CHOLECYSTECTOMY  2011   ESOPHAGOGASTRODUODENOSCOPY N/A  02/27/2013   Procedure: ESOPHAGOGASTRODUODENOSCOPY (EGD);  Surgeon: Florencia Reasons, MD;  Location: [facility];  Service: Endoscopy;  Laterality: N/A;   KNEE ARTHROSCOPY Right 2013 X 2   KNEE ARTHROSCOPY Right 02/22/2018   LAMINECTOMY AND MICRODISCECTOMY SPINE     LEFT HEART CATHETERIZATION WITH CORONARY ANGIOGRAM N/A 04/17/2014   Procedure: LEFT HEART CATHETERIZATION WITH CORONARY ANGIOGRAM;  Surgeon: Ricki Rodriguez, MD;  Location: [facility];  Service: Cardiovascular;  Laterality: N/A;   RADIOLOGY WITH ANESTHESIA N/A 06/11/2015   Procedure: MRI LUMBAR WITH AND WITHOUT CONTRAST;  Surgeon: Medication Radiologist, MD;  Location: [facility] OR;  Service: Radiology;  Laterality: N/A;   SPINE SURGERY N/A    Phreesia 04/14/2020    Social History   Socioeconomic History   Marital status: Married    Spouse name: Janelle Floor    Number of children: 2   Years of education: Not on file   Highest education level: Not on file  Occupational History   Occupation: Disabled  Tobacco Use   Smoking status: Former    Packs/day: 0.50    Years: 27.00    Additional pack years: 0.00    Total pack years: 13.50    Types: Cigarettes    Quit date: 03/11/2015    Years since quitting: 7.5   Smokeless tobacco: Former  Building services engineer Use: Never used  Substance and Sexual Activity   Alcohol use: Yes   Drug use: No    Comment: 04/16/2014 "in my teens"   Sexual activity: Yes  Other Topics Concern   Not on file  Social History Narrative   Drinks 2 cups of coffee in the morning.   Social Determinants of Health   Financial Resource Strain: Low Risk  (03/04/2022)   Overall Financial Resource Strain (CARDIA)    Difficulty of Paying Living Expenses: Not hard at all  Food Insecurity: No Food Insecurity (03/04/2022)   Hunger Vital Sign    Worried About Running Out of Food in the Last Year: Never true    Ran Out of Food in the Last Year: Never true  Transportation Needs: No Transportation Needs (03/04/2022)    PRAPARE - Administrator, Civil Service (Medical): No    Lack of Transportation (Non-Medical): No  Physical Activity: Sufficiently Active (03/04/2022)   Exercise Vital Sign    Days of Exercise per Week: 5 days    Minutes of Exercise per Session: 30 min  Stress: No Stress Concern Present (03/04/2022)   [employer] of Occupational Health - Occupational Stress Questionnaire    Feeling of Stress : Not at all  Social Connections: Socially Integrated (03/04/2022)   Social Connection and Isolation Panel [NHANES]    Frequency of Communication with Friends and Family: More than three times a week  Frequency of Social Gatherings with Friends and Family: More than three times a week    Attends Religious Services: More than 4 times per year    Active Member of [employer] or Organizations: Yes    Attends Engineer, structural: More than 4 times per year    Marital Status: Married  Catering manager Violence: Not At Risk (03/04/2022)   Humiliation, Afraid, Rape, and Kick questionnaire    Fear of Current or Ex-Partner: No    Emotionally Abused: No    Physically Abused: No    Sexually Abused: No    Family History  Problem Relation Age of Onset   Multiple sclerosis Mother    Obesity Mother    Heart failure Maternal Grandfather    Diabetes Father    Hyperlipidemia Father    Sleep apnea Father    Obesity Father    Fibromyalgia Sister    Schizophrenia Brother    Stroke Maternal Grandmother    Diabetes Paternal Grandmother      Review of Systems  Constitutional: Negative.  Negative for chills and fever.  HENT: Negative.  Negative for congestion and sore throat.   Respiratory: Negative.  Negative for cough and shortness of breath.   Cardiovascular: Negative.  Negative for chest pain and palpitations.  Gastrointestinal:  Negative for abdominal pain, diarrhea, nausea and vomiting.  Musculoskeletal:  Positive for back pain and neck pain.  Skin: Negative.  Negative for rash.   Neurological: Negative.  Negative for dizziness and headaches.  All other systems reviewed and are negative.   Today's Vitals   10/05/22 0759  BP: 128/84  Pulse: 65  Temp: 98.1 F (36.7 C)  TempSrc: Oral  SpO2: 98%  Weight: 211 lb 8 oz (95.9 kg)  Height: 5\' 8"  (1.727 m)   Body mass index is 32.16 kg/m.   Physical Exam Vitals reviewed.  Constitutional:      Appearance: Normal appearance.  HENT:     Head: Normocephalic.     Right Ear: Tympanic membrane, ear canal and external ear normal.     Left Ear: Tympanic membrane, ear canal and external ear normal.     Mouth/Throat:     Mouth: Mucous membranes are moist.     Pharynx: Oropharynx is clear.  Eyes:     Extraocular Movements: Extraocular movements intact.     Conjunctiva/sclera: Conjunctivae normal.     Pupils: Pupils are equal, round, and reactive to light.  Cardiovascular:     Rate and Rhythm: Normal rate and regular rhythm.     Pulses: Normal pulses.     Heart sounds: Normal heart sounds.  Pulmonary:     Effort: Pulmonary effort is normal.     Breath sounds: Normal breath sounds.  Abdominal:     Palpations: Abdomen is soft.     Tenderness: There is no abdominal tenderness.  Musculoskeletal:     Cervical back: No tenderness.     Right lower leg: No edema.     Left lower leg: No edema.  Lymphadenopathy:     Cervical: No cervical adenopathy.  Skin:    General: Skin is warm and dry.     Capillary Refill: Capillary refill takes less than 2 seconds.  Neurological:     Mental Status: He is alert and oriented to person, place, and time.  Psychiatric:        Mood and Affect: Mood normal.        Behavior: Behavior normal.      ASSESSMENT &  PLAN: A total of 45 minutes was spent with the patient and counseling/coordination of care regarding preparing for this visit, review of most recent office visit notes, review of multiple chronic medical conditions under management, review of all medications, review of most  recent blood work results, education on nutrition, review of health maintenance items, prognosis, pain management, documentation and need for follow-up.  Problem List Items Addressed This Visit       Digestive   Hepatic steatosis    Eating better and exercising more CMP done today Diet and nutrition discussed        Nervous and Auditory   Hearing loss    Bilateral and intermittent Affecting quality of life Needs ENT evaluation Referral placed today      Relevant Orders   Ambulatory referral to ENT     Hematopoietic and Hemostatic   Pancytopenia (HCC)    Chronic.  Stable.  No concerns. CBC done today      Relevant Orders   CBC with Differential/Platelet   Thrombocytopenia (HCC)    Chronic No clinical bleeding episodes CBC done today      Relevant Orders   CBC with Differential/Platelet     Other   Class 2 severe obesity with serious comorbidity and body mass index (BMI) of 38.0 to 38.9 in adult [facility])    Eating better and exercising more Advised to decrease amount of daily carbohydrate intake and daily calories and increase amount of plant-based protein in his diet Benefits of exercise discussed.      Prediabetes    Stable.  Hemoglobin A1c done today Diet and nutrition discussed. Continues weekly Ozempic 0.5 mg      Relevant Orders   Comprehensive metabolic panel   Hemoglobin A1c   Vitamin D deficiency    Stable Vitamin D level done today      Relevant Orders   VITAMIN D 25 Hydroxy (Vit-D Deficiency, Fractures)   Dyslipidemia    Stable chronic condition Lipid profile done today Diet and nutrition discussed Continues rosuvastatin 10 mg daily      Relevant Orders   Comprehensive metabolic panel   Lipid panel   Cervical spine disease - Primary    Has been evaluated by a neurosurgeon in the past. Does not wish to have surgery at present time Pain management discussed.  Well-controlled. Flexeril helps Recommend 10 mg every 6-8 hours as needed       Cervical paraspinal muscle spasm    Pain management discussed. Stop Zanaflex and start Flexeril 10 mg every 8 hours as needed      Relevant Medications   cyclobenzaprine (FLEXERIL) 10 MG tablet   Patient Instructions  Health Maintenance, Male Adopting a healthy lifestyle and getting preventive care are important in promoting health and wellness. Ask your health care provider about: The right schedule for you to have regular tests and exams. Things you can do on your own to prevent diseases and keep yourself healthy. What should I know about diet, weight, and exercise? Eat a healthy diet  Eat a diet that includes plenty of vegetables, fruits, low-fat dairy products, and lean protein. Do not eat a lot of foods that are high in solid fats, added sugars, or sodium. Maintain a healthy weight Body mass index (BMI) is a measurement that can be used to identify possible weight problems. It estimates body fat based on height and weight. Your health care provider can help determine your BMI and help you achieve or maintain a healthy weight. Get  regular exercise Get regular exercise. This is one of the most important things you can do for your health. Most adults should: Exercise for at least 150 minutes each week. The exercise should increase your heart rate and make you sweat (moderate-intensity exercise). Do strengthening exercises at least twice a week. This is in addition to the moderate-intensity exercise. Spend less time sitting. Even light physical activity can be beneficial. Watch cholesterol and blood lipids Have your blood tested for lipids and cholesterol at 45 years of age, then have this test every 5 years. You may need to have your cholesterol levels checked more often if: Your lipid or cholesterol levels are high. You are older than 45 years of age. You are at high risk for heart disease. What should I know about cancer screening? Many types of cancers can be detected early  and may often be prevented. Depending on your health history and family history, you may need to have cancer screening at various ages. This may include screening for: Colorectal cancer. Prostate cancer. Skin cancer. Lung cancer. What should I know about heart disease, diabetes, and high blood pressure? Blood pressure and heart disease High blood pressure causes heart disease and increases the risk of stroke. This is more likely to develop in people who have high blood pressure readings or are overweight. Talk with your health care provider about your target blood pressure readings. Have your blood pressure checked: Every 3-5 years if you are 10-28 years of age. Every year if you are 69 years old or older. If you are between the ages of 36 and 61 and are a current or former smoker, ask your health care provider if you should have a one-time screening for abdominal aortic aneurysm (AAA). Diabetes Have regular diabetes screenings. This checks your fasting blood sugar level. Have the screening done: Once every three years after age 38 if you are at a normal weight and have a low risk for diabetes. More often and at a younger age if you are overweight or have a high risk for diabetes. What should I know about preventing infection? Hepatitis B If you have a higher risk for hepatitis B, you should be screened for this virus. Talk with your health care provider to find out if you are at risk for hepatitis B infection. Hepatitis C Blood testing is recommended for: Everyone born from [birth year] through 1963. Anyone with known risk factors for hepatitis C. Sexually transmitted infections (STIs) You should be screened each year for STIs, including gonorrhea and chlamydia, if: You are sexually active and are younger than 45 years of age. You are older than 45 years of age and your health care provider tells you that you are at risk for this type of infection. Your sexual activity has changed since you were  last screened, and you are at increased risk for chlamydia or gonorrhea. Ask your health care provider if you are at risk. Ask your health care provider about whether you are at high risk for HIV. Your health care provider may recommend a prescription medicine to help prevent HIV infection. If you choose to take medicine to prevent HIV, you should first get tested for HIV. You should then be tested every 3 months for as long as you are taking the medicine. Follow these instructions at home: Alcohol use Do not drink alcohol if your health care provider tells you not to drink. If you drink alcohol: Limit how much you have to 0-2 drinks a day. Know  how much alcohol is in your drink. In the [place] one drink equals one 12 oz bottle of beer (355 mL), one 5 oz glass of wine (148 mL), or one 1 oz glass of hard liquor (44 mL). Lifestyle Do not use any products that contain nicotine or tobacco. These products include cigarettes, chewing tobacco, and vaping devices, such as e-cigarettes. If you need help quitting, ask your health care provider. Do not use street drugs. Do not share needles. Ask your health care provider for help if you need support or information about quitting drugs. General instructions Schedule regular health, dental, and eye exams. Stay current with your vaccines. Tell your health care provider if: You often feel depressed. You have ever been abused or do not feel safe at home. Summary Adopting a healthy lifestyle and getting preventive care are important in promoting health and wellness. Follow your health care provider's instructions about healthy diet, exercising, and getting tested or screened for diseases. Follow your health care provider's instructions on monitoring your cholesterol and blood pressure. This information is not intended to replace advice given to you by your health care provider. Make sure you discuss any questions you have with your health care  provider. Document Revised: 10/06/2020 Document Reviewed: 10/06/2020 Elsevier Patient Education  2023 Elsevier Inc.    Edwina Barth, MD [facility]

## 2022-10-05 NOTE — Assessment & Plan Note (Signed)
Eating better and exercising more CMP done today Diet and nutrition discussed

## 2022-10-05 NOTE — Assessment & Plan Note (Signed)
Stable Vitamin D level done today

## 2022-12-19 ENCOUNTER — Encounter: Payer: Self-pay | Admitting: Physician Assistant

## 2022-12-19 ENCOUNTER — Telehealth: Payer: Medicare Other | Admitting: Physician Assistant

## 2022-12-19 DIAGNOSIS — J069 Acute upper respiratory infection, unspecified: Secondary | ICD-10-CM

## 2022-12-19 MED ORDER — COVID-19 AT HOME ANTIGEN TEST VI KIT
PACK | 0 refills | Status: DC
Start: 2022-12-19 — End: 2023-04-07

## 2022-12-19 MED ORDER — IPRATROPIUM BROMIDE 0.03 % NA SOLN
2.0000 | Freq: Two times a day (BID) | NASAL | 0 refills | Status: AC
Start: 2022-12-19 — End: ?

## 2022-12-19 MED ORDER — PSEUDOEPH-BROMPHEN-DM 30-2-10 MG/5ML PO SYRP
5.0000 mL | ORAL_SOLUTION | Freq: Four times a day (QID) | ORAL | 0 refills | Status: DC | PRN
Start: 2022-12-19 — End: 2023-04-07

## 2022-12-19 NOTE — Progress Notes (Signed)
Virtual Visit Consent   JHOEL STIEG, you are scheduled for a virtual visit with a [facility] provider today. Just as with appointments in the office, your consent must be obtained to participate. Your consent will be active for this visit and any virtual visit you may have with one of our providers in the next 365 days. If you have a MyChart account, a copy of this consent can be sent to you electronically.  As this is a virtual visit, video technology does not allow for your provider to perform a traditional examination. This may limit your provider's ability to fully assess your condition. If your provider identifies any concerns that need to be evaluated in person or the need to arrange testing (such as labs, EKG, etc.), we will make arrangements to do so. Although advances in technology are sophisticated, we cannot ensure that it will always work on either your end or our end. If the connection with a video visit is poor, the visit may have to be switched to a telephone visit. With either a video or telephone visit, we are not always able to ensure that we have a secure connection.  By engaging in this virtual visit, you consent to the provision of healthcare and authorize for your insurance to be billed (if applicable) for the services provided during this visit. Depending on your insurance coverage, you may receive a charge related to this service.  I need to obtain your verbal consent now. Are you willing to proceed with your visit today? Bruce Morales has provided verbal consent on 12/19/2022 for a virtual visit (video or telephone). Margaretann Loveless, PA-C  Date: 12/19/2022 6:05 PM  Virtual Visit via Video Note   I, Margaretann Loveless, connected with  Bruce Morales  ([id number], [birth year]) on 12/19/22 at  6:00 PM EDT by a video-enabled telemedicine application and verified that I am speaking with the correct person using two identifiers.  Location: Patient: Virtual  Visit Location Patient: Home Provider: Virtual Visit Location Provider: Home Office   I discussed the limitations of evaluation and management by telemedicine and the availability of in person appointments. The patient expressed understanding and agreed to proceed.    History of Present Illness: Bruce Morales is a 47 y.o. who identifies as a male who was assigned male at birth, and is being seen today for URI symptoms.  HPI: URI  This is a new problem. The current episode started yesterday. The problem has been rapidly worsening. There has been no fever. Associated symptoms include congestion, coughing, headaches, a plugged ear sensation, rhinorrhea, sinus pain and a sore throat. Pertinent negatives include no diarrhea, ear pain, nausea or vomiting. Associated symptoms comments: Fatigue, mylagias. He has tried nothing for the symptoms. The treatment provided no relief.      Problems:  Patient Active Problem List   Diagnosis Date Noted   Cervical spine disease 10/05/2022   Cervical paraspinal muscle spasm 10/05/2022   Dyslipidemia 04/05/2022   Hepatic steatosis 09/30/2021   History of tobacco use 02/06/2020   Other hyperlipidemia 07/18/2018   Depression 07/18/2018   Prediabetes 05/17/2018   Vitamin D deficiency 05/17/2018   Hearing loss 09/04/2015   Thrombocytopenia (HCC)    Pancytopenia (HCC) 06/24/2015   Status post lumbar laminectomy 06/10/2015   Class 2 severe obesity with serious comorbidity and body mass index (BMI) of 38.0 to 38.9 in adult [facility]) 11/13/2014    Allergies:  Allergies  Allergen Reactions   Other Hives  and Itching   Hydrocodone Hives and Itching   Oxycodone     Mild Itching, patient can tolerate oxycodone   Percocet [Oxycodone-Acetaminophen]     Itching, patient says he tolerates   Medications:  Current Outpatient Medications:    brompheniramine-pseudoephedrine-DM 30-2-10 MG/5ML syrup, Take 5 mLs by mouth 4 (four) times daily as needed., Disp: 120 mL,  Rfl: 0   COVID-19 At Home Antigen Test [facility] COVID-19 AG HOME TEST) KIT, Use as directed., Disp: 1 each, Rfl: 0   ipratropium (ATROVENT) 0.03 % nasal spray, Place 2 sprays into both nostrils every 12 (twelve) hours., Disp: 30 mL, Rfl: 0   ascorbic acid (VITAMIN C) 1000 MG tablet, Take by mouth., Disp: , Rfl:    Chlorphen-PE-Acetaminophen 4-10-325 MG TABS, Take 1 tablet by mouth every 6 (six) hours as needed., Disp: 20 tablet, Rfl: 0   cyclobenzaprine (FLEXERIL) 10 MG tablet, Take 1 tablet (10 mg total) by mouth 3 (three) times daily as needed for muscle spasms., Disp: 30 tablet, Rfl: 3   diltiazem (CARDIZEM) 30 MG tablet, TAKE 1 TABLET EVERY DAY AS NEEDED FOR RECTAL SPASM PAINS (Patient taking differently: Take 30 mg by mouth daily. TAKE 1 TABLET EVERY DAY), Disp: 90 tablet, Rfl: 1   GREEN TEA, CAMELLIA SINENSIS, PO, Take by mouth daily in the afternoon., Disp: , Rfl:    HYDROmorphone (DILAUDID) 4 MG tablet, Take 4 mg by mouth every 6 (six) hours as needed. (Patient not taking: Reported on 10/05/2022), Disp: , Rfl:    rosuvastatin (CRESTOR) 10 MG tablet, Take 1 tablet (10 mg total) by mouth daily., Disp: 90 tablet, Rfl: 3   Semaglutide,0.25 or 0.5MG /DOS, (OZEMPIC, 0.25 OR 0.5 MG/DOSE,) 2 MG/1.5ML SOPN, See admin instructions., Disp: , Rfl:   Observations/Objective: Patient is well-developed, well-nourished in no acute distress.  Resting comfortably at home.  Head is normocephalic, atraumatic.  No labored breathing.  Speech is clear and coherent with logical content.  Patient is alert and oriented at baseline.    Assessment and Plan: 1. Viral URI with cough - brompheniramine-pseudoephedrine-DM 30-2-10 MG/5ML syrup; Take 5 mLs by mouth 4 (four) times daily as needed.  Dispense: 120 mL; Refill: 0 - ipratropium (ATROVENT) 0.03 % nasal spray; Place 2 sprays into both nostrils every 12 (twelve) hours.  Dispense: 30 mL; Refill: 0 - COVID-19 At Home Antigen Test ([facility] COVID-19 AG HOME TEST)  KIT; Use as directed.  Dispense: 1 each; Refill: 0  - Suspect viral URI, possible Covid 19; advised to test for Covid 19 on at home test - Symptomatic medications of choice over the counter as needed - Prescribed Ipratropium Bromide nasal spray and Bromfed DM cough syrup - Push fluids - Rest - Seek further evaluation if symptoms change or worsen   Follow Up Instructions: I discussed the assessment and treatment plan with the patient. The patient was provided an opportunity to ask questions and all were answered. The patient agreed with the plan and demonstrated an understanding of the instructions.  A copy of instructions were sent to the patient via MyChart unless otherwise noted below.    The patient was advised to call back or seek an in-person evaluation if the symptoms worsen or if the condition fails to improve as anticipated.  Time:  I spent 10 minutes with the patient via telehealth technology discussing the above problems/concerns.    Margaretann Loveless, PA-C

## 2022-12-19 NOTE — Patient Instructions (Signed)
Bruce Morales, thank you for joining Margaretann Loveless, PA-C for today's virtual visit.  While this provider is not your primary care provider (PCP), if your PCP is located in our provider database this encounter information will be shared with them immediately following your visit.   A [facility] MyChart account gives you access to today's visit and all your visits, tests, and labs performed at [facility] " click here if you don't have a [name] MyChart account or go to [email or web address]  Consent: (Patient) Bruce Morales provided verbal consent for this virtual visit at the beginning of the encounter.  Current Medications:  Current Outpatient Medications:    brompheniramine-pseudoephedrine-DM 30-2-10 MG/5ML syrup, Take 5 mLs by mouth 4 (four) times daily as needed., Disp: 120 mL, Rfl: 0   COVID-19 At Home Antigen Test [facility] COVID-19 AG HOME TEST) KIT, Use as directed., Disp: 1 each, Rfl: 0   ipratropium (ATROVENT) 0.03 % nasal spray, Place 2 sprays into both nostrils every 12 (twelve) hours., Disp: 30 mL, Rfl: 0   ascorbic acid (VITAMIN C) 1000 MG tablet, Take by mouth., Disp: , Rfl:    Chlorphen-PE-Acetaminophen 4-10-325 MG TABS, Take 1 tablet by mouth every 6 (six) hours as needed., Disp: 20 tablet, Rfl: 0   cyclobenzaprine (FLEXERIL) 10 MG tablet, Take 1 tablet (10 mg total) by mouth 3 (three) times daily as needed for muscle spasms., Disp: 30 tablet, Rfl: 3   diltiazem (CARDIZEM) 30 MG tablet, TAKE 1 TABLET EVERY DAY AS NEEDED FOR RECTAL SPASM PAINS (Patient taking differently: Take 30 mg by mouth daily. TAKE 1 TABLET EVERY DAY), Disp: 90 tablet, Rfl: 1   GREEN TEA, CAMELLIA SINENSIS, PO, Take by mouth daily in the afternoon., Disp: , Rfl:    HYDROmorphone (DILAUDID) 4 MG tablet, Take 4 mg by mouth every 6 (six) hours as needed. (Patient not taking: Reported on 10/05/2022), Disp: , Rfl:    rosuvastatin (CRESTOR) 10 MG tablet, Take 1 tablet (10 mg  total) by mouth daily., Disp: 90 tablet, Rfl: 3   Semaglutide,0.25 or 0.5MG /DOS, (OZEMPIC, 0.25 OR 0.5 MG/DOSE,) 2 MG/1.5ML SOPN, See admin instructions., Disp: , Rfl:    Medications ordered in this encounter:  Meds ordered this encounter  Medications   brompheniramine-pseudoephedrine-DM 30-2-10 MG/5ML syrup    Sig: Take 5 mLs by mouth 4 (four) times daily as needed.    Dispense:  120 mL    Refill:  0    Order Specific Question:   Supervising Provider    Answer:   Merrilee Jansky [[id number]]   ipratropium (ATROVENT) 0.03 % nasal spray    Sig: Place 2 sprays into both nostrils every 12 (twelve) hours.    Dispense:  30 mL    Refill:  0    Order Specific Question:   Supervising Provider    Answer:   Merrilee Jansky [id number]   COVID-19 At Home Antigen Test [facility] COVID-19 AG HOME TEST) KIT    Sig: Use as directed.    Dispense:  1 each    Refill:  0    Order Specific Question:   Supervising Provider    Answer:   Merrilee Jansky [id number]     *If you need refills on other medications prior to your next appointment, please contact your pharmacy*  Follow-Up: Call back or seek an in-person evaluation if the symptoms worsen or if the condition fails to improve as anticipated.  [facility] Virtual Care [phone]  Other Instructions  Upper Respiratory Infection, Adult An upper respiratory infection (URI) is a common viral infection of the nose, throat, and upper air passages that lead to the lungs. The most common type of URI is the common cold. URIs usually get better on their own, without medical treatment. What are the causes? A URI is caused by a virus. You may catch a virus by: Breathing in droplets from an infected person's cough or sneeze. Touching something that has been exposed to the virus (is contaminated) and then touching your mouth, nose, or eyes. What increases the risk? You are more likely to get a URI if: You are very young or very old. You have close  contact with others, such as at work, school, or a health care facility. You smoke. You have long-term (chronic) heart or lung disease. You have a weakened disease-fighting system (immune system). You have nasal allergies or asthma. You are experiencing a lot of stress. You have poor nutrition. What are the signs or symptoms? A URI usually involves some of the following symptoms: Runny or stuffy (congested) nose. Cough. Sneezing. Sore throat. Headache. Fatigue. Fever. Loss of appetite. Pain in your forehead, behind your eyes, and over your cheekbones (sinus pain). Muscle aches. Redness or irritation of the eyes. Pressure in the ears or face. How is this diagnosed? This condition may be diagnosed based on your medical history and symptoms, and a physical exam. Your health care provider may use a swab to take a mucus sample from your nose (nasal swab). This sample can be tested to determine what virus is causing the illness. How is this treated? URIs usually get better on their own within 7-10 days. Medicines cannot cure URIs, but your health care provider may recommend certain medicines to help relieve symptoms, such as: Over-the-counter cold medicines. Cough suppressants. Coughing is a type of defense against infection that helps to clear the respiratory system, so take these medicines only as recommended by your health care provider. Fever-reducing medicines. Follow these instructions at home: Activity Rest as needed. If you have a fever, stay home from work or school until your fever is gone or until your health care provider says your URI cannot spread to other people (is no longer contagious). Your health care provider may have you wear a face mask to prevent your infection from spreading. Relieving symptoms Gargle with a mixture of salt and water 3-4 times a day or as needed. To make salt water, completely dissolve -1 tsp (3-6 g) of salt in 1 cup (237 mL) of warm water. Use a  cool-mist humidifier to add moisture to the air. This can help you breathe more easily. Eating and drinking  Drink enough fluid to keep your urine pale yellow. Eat soups and other clear broths. General instructions  Take over-the-counter and prescription medicines only as told by your health care provider. These include cold medicines, fever reducers, and cough suppressants. Do not use any products that contain nicotine or tobacco. These products include cigarettes, chewing tobacco, and vaping devices, such as e-cigarettes. If you need help quitting, ask your health care provider. Stay away from secondhand smoke. Stay up to date on all immunizations, including the yearly (annual) flu vaccine. Keep all follow-up visits. This is important. How to prevent the spread of infection to others URIs can be contagious. To prevent the infection from spreading: Wash your hands with soap and water for at least 20 seconds. If soap and water are not available, use hand sanitizer. Avoid touching your  mouth, face, eyes, or nose. Cough or sneeze into a tissue or your sleeve or elbow instead of into your hand or into the air.  Contact a health care provider if: You are getting worse instead of better. You have a fever or chills. Your mucus is brown or red. You have yellow or brown discharge coming from your nose. You have pain in your face, especially when you bend forward. You have swollen neck glands. You have pain while swallowing. You have white areas in the back of your throat. Get help right away if: You have shortness of breath that gets worse. You have severe or persistent: Headache. Ear pain. Sinus pain. Chest pain. You have chronic lung disease along with any of the following: Making high-pitched whistling sounds when you breathe, most often when you breathe out (wheezing). Prolonged cough (more than 14 days). Coughing up blood. A change in your usual mucus. You have a stiff neck. You  have changes in your: Vision. Hearing. Thinking. Mood. These symptoms may be an emergency. Get help right away. Call 911. Do not wait to see if the symptoms will go away. Do not drive yourself to the hospital. Summary An upper respiratory infection (URI) is a common infection of the nose, throat, and upper air passages that lead to the lungs. A URI is caused by a virus. URIs usually get better on their own within 7-10 days. Medicines cannot cure URIs, but your health care provider may recommend certain medicines to help relieve symptoms. This information is not intended to replace advice given to you by your health care provider. Make sure you discuss any questions you have with your health care provider. Document Revised: 12/17/2020 Document Reviewed: 12/17/2020 Elsevier Patient Education  2024 [employer]    If you have been instructed to have an in-person evaluation today at a local Urgent Care facility, please use the link below. It will take you to a list of all of our available [facility], including address, phone number and hours of operation. Please do not delay care.  [facility]  If you or a family member do not have a primary care provider, use the link below to schedule a visit and establish care. When you choose a [facility] primary care physician or advanced practice provider, you gain a long-term partner in health. Find a Primary Care Provider  Learn more about [facility] in-office and virtual care options: [place] - Get Care Now

## 2023-03-07 ENCOUNTER — Ambulatory Visit (INDEPENDENT_AMBULATORY_CARE_PROVIDER_SITE_OTHER): Payer: Medicare Other

## 2023-03-07 VITALS — Ht 68.0 in | Wt 215.0 lb

## 2023-03-07 DIAGNOSIS — Z0000 Encounter for general adult medical examination without abnormal findings: Secondary | ICD-10-CM | POA: Diagnosis not present

## 2023-03-07 DIAGNOSIS — Z1211 Encounter for screening for malignant neoplasm of colon: Secondary | ICD-10-CM

## 2023-03-07 NOTE — Patient Instructions (Addendum)
Bruce Morales , Thank you for taking time to come for your Medicare Wellness Visit. I appreciate your ongoing commitment to your health goals. Please review the following plan we discussed and let me know if I can assist you in the future.   Referrals/Orders/Follow-Ups/Clinician Recommendations: [facility], [place] (Dr. Serita Grammes) for screening colonoscopy.  This is a list of the screening recommended for you and due dates:  Health Maintenance  Topic Date Due   Flu Shot  Never done   COVID-19 Vaccine (1 - 2023-24 season) Never done   Medicare Annual Wellness Visit  03/06/2024   Hepatitis C Screening  Completed   HIV Screening  Completed   HPV Vaccine  Aged Out   DTaP/Tdap/Td vaccine  Discontinued    Advanced directives: (Declined) Advance directive discussed with you today. Even though you declined this today, please call our office should you change your mind, and we can give you the proper paperwork for you to fill out.  Next Medicare Annual Wellness Visit scheduled for next year: Yes

## 2023-03-07 NOTE — Progress Notes (Signed)
Subjective:   Bruce Morales is a 47 y.o. male who presents for Medicare Annual/Subsequent preventive examination.  Visit Complete: Virtual  I connected with  [name] on 03/07/23 by a audio enabled telemedicine application and verified that I am speaking with the correct person using two identifiers.  Patient Location: Home  Provider Location: Office/Clinic  I discussed the limitations of evaluation and management by telemedicine. The patient expressed understanding and agreed to proceed.  Because this visit was a virtual/telehealth visit, some criteria may be missing or patient reported. Any vitals not documented were not able to be obtained and vitals that have been documented are patient reported.   Cardiac Risk Factors include: advanced age (>35men, >12 women);dyslipidemia;family history of premature cardiovascular disease;hypertension;male gender;obesity (BMI >30kg/m2);sedentary lifestyle;smoking/ tobacco exposure;Other (see comment), Risk factor comments: cigars     Objective:    Today's Vitals   03/07/23 0850 03/07/23 0852  Weight: 215 lb (97.5 kg)   Height: 5\' 8"  (1.727 m)   PainSc: 6  6   PainLoc: Neck    Body mass index is 32.69 kg/m.     03/07/2023    8:54 AM 03/04/2022    8:50 AM 08/16/2021    8:36 AM 07/22/2016   10:24 PM 06/24/2015    9:45 AM 06/05/2015    5:02 AM 06/04/2015    7:44 PM  Advanced Directives  Does Patient Have a Medical Advance Directive? No No No No No No No  Would patient like information on creating a medical advance directive? No - Patient declined No - Patient declined No - Patient declined  No - patient declined information No - patient declined information     Current Medications (verified) Outpatient Encounter Medications as of 03/07/2023  Medication Sig   ascorbic acid (VITAMIN C) 1000 MG tablet Take by mouth.   brompheniramine-pseudoephedrine-DM 30-2-10 MG/5ML syrup Take 5 mLs by mouth 4 (four) times daily as needed.    Chlorphen-PE-Acetaminophen 4-10-325 MG TABS Take 1 tablet by mouth every 6 (six) hours as needed.   COVID-19 At Home Antigen Test [facility] COVID-19 AG HOME TEST) KIT Use as directed.   cyclobenzaprine (FLEXERIL) 10 MG tablet Take 1 tablet (10 mg total) by mouth 3 (three) times daily as needed for muscle spasms.   diltiazem (CARDIZEM) 30 MG tablet TAKE 1 TABLET EVERY DAY AS NEEDED FOR RECTAL SPASM PAINS (Patient taking differently: Take 30 mg by mouth daily. TAKE 1 TABLET EVERY DAY)   GREEN TEA, CAMELLIA SINENSIS, PO Take by mouth daily in the afternoon.   HYDROmorphone (DILAUDID) 4 MG tablet Take 4 mg by mouth every 6 (six) hours as needed. (Patient not taking: Reported on 10/05/2022)   ipratropium (ATROVENT) 0.03 % nasal spray Place 2 sprays into both nostrils every 12 (twelve) hours.   rosuvastatin (CRESTOR) 10 MG tablet Take 1 tablet (10 mg total) by mouth daily.   Semaglutide,0.25 or 0.5MG /DOS, (OZEMPIC, 0.25 OR 0.5 MG/DOSE,) 2 MG/1.5ML SOPN See admin instructions.   No facility-administered encounter medications on file as of 03/07/2023.    Allergies (verified) Other, Hydrocodone, Oxycodone, and Percocet [oxycodone-acetaminophen]   History: Past Medical History:  Diagnosis Date   Anginal pain (HCC) 04/15/2014   Arthritis    "wrists, knees, lower back" (04/16/2014)   Carpal tunnel syndrome    Chronic bronchitis (HCC)    "probably get it q yr"   Chronic lower back pain    Chronic lower back pain    Constipation    Gall stones  GERD (gastroesophageal reflux disease)    GERD (gastroesophageal reflux disease)    HLD (hyperlipidemia)    Hyperlipidemia    Phreesia 04/14/2020   Joint pain    Meningitis    Plantar fasciitis    Right knee pain    Sciatica    Past Surgical History:  Procedure Laterality Date   CARPAL TUNNEL RELEASE     02/2018 Right arm   CHOLECYSTECTOMY  2011   ESOPHAGOGASTRODUODENOSCOPY N/A 02/27/2013   Procedure: ESOPHAGOGASTRODUODENOSCOPY (EGD);  Surgeon:  Florencia Reasons, MD;  Location: [facility];  Service: Endoscopy;  Laterality: N/A;   KNEE ARTHROSCOPY Right 2013 X 2   KNEE ARTHROSCOPY Right 02/22/2018   LAMINECTOMY AND MICRODISCECTOMY SPINE     LEFT HEART CATHETERIZATION WITH CORONARY ANGIOGRAM N/A 04/17/2014   Procedure: LEFT HEART CATHETERIZATION WITH CORONARY ANGIOGRAM;  Surgeon: Ricki Rodriguez, MD;  Location: [facility];  Service: Cardiovascular;  Laterality: N/A;   RADIOLOGY WITH ANESTHESIA N/A 06/11/2015   Procedure: MRI LUMBAR WITH AND WITHOUT CONTRAST;  Surgeon: Medication Radiologist, MD;  Location: [facility] OR;  Service: Radiology;  Laterality: N/A;   SPINE SURGERY N/A    Phreesia 04/14/2020   Family History  Problem Relation Age of Onset   Multiple sclerosis Mother    Obesity Mother    Heart failure Maternal Grandfather    Diabetes Father    Hyperlipidemia Father    Sleep apnea Father    Obesity Father    Fibromyalgia Sister    Schizophrenia Brother    Stroke Maternal Grandmother    Diabetes Paternal Grandmother    Social History   Socioeconomic History   Marital status: Married    Spouse name: Janelle Floor    Number of children: 2   Years of education: Not on file   Highest education level: Not on file  Occupational History   Occupation: Disabled  Tobacco Use   Smoking status: Former    Current packs/day: 0.00    Average packs/day: 0.5 packs/day for 27.0 years (13.5 ttl pk-yrs)    Types: Cigarettes    Start date: 03/10/1988    Quit date: 03/11/2015    Years since quitting: 7.9   Smokeless tobacco: Former  Building services engineer status: Never Used  Substance and Sexual Activity   Alcohol use: Yes   Drug use: No    Comment: 04/16/2014 "in my teens"   Sexual activity: Yes  Other Topics Concern   Not on file  Social History Narrative   Drinks 2 cups of coffee in the morning.   Social Determinants of Health   Financial Resource Strain: Low Risk  (03/07/2023)   Overall Financial Resource Strain (CARDIA)     Difficulty of Paying Living Expenses: Not hard at all  Food Insecurity: No Food Insecurity (03/07/2023)   Hunger Vital Sign    Worried About Running Out of Food in the Last Year: Never true    Ran Out of Food in the Last Year: Never true  Transportation Needs: No Transportation Needs (03/07/2023)   PRAPARE - Administrator, Civil Service (Medical): No    Lack of Transportation (Non-Medical): No  Physical Activity: Inactive (03/07/2023)   Exercise Vital Sign    Days of Exercise per Week: 0 days    Minutes of Exercise per Session: 0 min  Stress: No Stress Concern Present (03/07/2023)   [employer] of Occupational Health - Occupational Stress Questionnaire    Feeling of Stress : Not at all  Social  Connections: Socially Integrated (03/07/2023)   Social Connection and Isolation Panel [NHANES]    Frequency of Communication with Friends and Family: More than three times a week    Frequency of Social Gatherings with Friends and Family: More than three times a week    Attends Religious Services: More than 4 times per year    Active Member of [employer] or Organizations: Yes    Attends Engineer, structural: More than 4 times per year    Marital Status: Married    Tobacco Counseling Counseling given: Not Answered   Clinical Intake:  Pre-visit preparation completed: Yes  Pain : 0-10 Pain Score: 6  Pain Type: Chronic pain Pain Location: Back (neck)     BMI - recorded: 32.69 Nutritional Status: BMI > 30  Obese Nutritional Risks: None Diabetes: No  How often do you need to have someone help you when you read instructions, pamphlets, or other written materials from your doctor or pharmacy?: 1 - Never What is the last grade level you completed in school?: HSG; Some College  Interpreter Needed?: No  Information entered by :: Susie Cassette, LPN.   Activities of Daily Living    03/07/2023    9:00 AM  In your present state of health, do you have any difficulty  performing the following activities:  Hearing? 0  Vision? 0  Difficulty concentrating or making decisions? 0  Walking or climbing stairs? 0  Dressing or bathing? 0  Doing errands, shopping? 0  Preparing Food and eating ? N  Using the Toilet? N  In the past six months, have you accidently leaked urine? N  Do you have problems with loss of bowel control? N  Managing your Medications? N  Managing your Finances? N  Housekeeping or managing your Housekeeping? N    Patient Care Team: [name], MD as PCP - General (Internal Medicine) Little Ishikawa, MD as PCP - Cardiology (Cardiology) Marzella Schlein., MD as Consulting Physician (Ophthalmology)  Indicate any recent Medical Services you may have received from other than [facility] providers in the past year (date may be approximate).     Assessment:   This is a routine wellness examination for Wise.  Hearing/Vision screen Hearing Screening - Comments:: Patient has hearing difficulty; No hearing aids. Vision Screening - Comments:: Patient does wear corrective lenses/contacts.  Annual eye exam done by: Velna Ochs, MD.     Goals Addressed             This Visit's Progress    My goal is to get down to 185-190 pounds.        Depression Screen    03/07/2023    8:58 AM 10/05/2022    8:01 AM 04/05/2022    8:12 AM 03/04/2022    8:51 AM 03/03/2022    2:46 PM 09/30/2021    8:07 AM 04/17/2020    8:04 AM  PHQ 2/9 Scores  PHQ - 2 Score 0 1 0 0 0 0 0  PHQ- 9 Score 1          Fall Risk    03/07/2023    8:57 AM 10/05/2022    8:00 AM 04/05/2022    8:12 AM 03/04/2022    8:51 AM 03/03/2022    2:46 PM  Fall Risk   Falls in the past year? 0 0 0 0 0  Number falls in past yr: 0 0 0 0 0  Injury with Fall? 0 0 0 0 0  Risk for  fall due to : No Fall Risks No Fall Risks No Fall Risks No Fall Risks No Fall Risks  Follow up Falls prevention discussed Falls evaluation completed Falls evaluation completed Falls prevention discussed  Falls evaluation completed    MEDICARE RISK AT HOME: Medicare Risk at Home Any stairs in or around the home?: No If so, are there any without handrails?: No Home free of loose throw rugs in walkways, pet beds, electrical cords, etc?: Yes Adequate lighting in your home to reduce risk of falls?: Yes Life alert?: No Use of a cane, walker or w/c?: No Grab bars in the bathroom?: Yes (in the shower) Shower chair or bench in shower?: Yes Elevated toilet seat or a handicapped toilet?: Yes  TIMED UP AND GO:  Was the test performed?  No    Cognitive Function:        03/07/2023    9:00 AM 03/04/2022    9:03 AM  6CIT Screen  What Year? 0 points 0 points  What month? 0 points 0 points  What time? 0 points 0 points  Count back from 20 0 points 0 points  Months in reverse 0 points 0 points  Repeat phrase 0 points 0 points  Total Score 0 points 0 points    Immunizations Immunization History  Administered Date(s) Administered   Tdap 06/11/2012    TDAP status: Due, Education has been provided regarding the importance of this vaccine. Advised may receive this vaccine at local pharmacy or Health Dept. Aware to provide a copy of the vaccination record if obtained from local pharmacy or Health Dept. Verbalized acceptance and understanding.  Flu Vaccine status: Declined, Education has been provided regarding the importance of this vaccine but patient still declined. Advised may receive this vaccine at local pharmacy or Health Dept. Aware to provide a copy of the vaccination record if obtained from local pharmacy or Health Dept. Verbalized acceptance and understanding.  Pneumococcal vaccine status: Declined,  Education has been provided regarding the importance of this vaccine but patient still declined. Advised may receive this vaccine at local pharmacy or Health Dept. Aware to provide a copy of the vaccination record if obtained from local pharmacy or Health Dept. Verbalized acceptance and  understanding.   Covid-19 vaccine status: Declined, Education has been provided regarding the importance of this vaccine but patient still declined. Advised may receive this vaccine at local pharmacy or Health Dept.or vaccine clinic. Aware to provide a copy of the vaccination record if obtained from local pharmacy or Health Dept. Verbalized acceptance and understanding.  Qualifies for Shingles Vaccine? No   Zostavax completed No   Shingrix Completed?: No.    Education has been provided regarding the importance of this vaccine. Patient has been advised to call insurance company to determine out of pocket expense if they have not yet received this vaccine. Advised may also receive vaccine at local pharmacy or Health Dept. Verbalized acceptance and understanding.  Screening Tests Health Maintenance  Topic Date Due   INFLUENZA VACCINE  Never done   COVID-19 Vaccine (1 - 2023-24 season) Never done   Medicare Annual Wellness (AWV)  03/06/2024   Hepatitis C Screening  Completed   HIV Screening  Completed   HPV VACCINES  Aged Out   DTaP/Tdap/Td  Discontinued    Health Maintenance  Health Maintenance Due  Topic Date Due   INFLUENZA VACCINE  Never done   COVID-19 Vaccine (1 - 2023-24 season) Never done    Colorectal cancer screening: Referral to GI  placed 03/07/2023. Pt aware the office will call re: appt.  Lung Cancer Screening: (Low Dose CT Chest recommended if Age 68-80 years, 20 pack-year currently smoking OR have quit w/in 15years.) does not qualify.   Lung Cancer Screening Referral: no  Additional Screening:  Hepatitis C Screening: does qualify; Completed 12/08/2017  Vision Screening: Recommended annual ophthalmology exams for early detection of glaucoma and other disorders of the eye. Is the patient up to date with their annual eye exam?  Yes  Who is the provider or what is the name of the office in which the patient attends annual eye exams? Velna Ochs, MD. If pt is not  established with a provider, would they like to be referred to a provider to establish care? No .   Dental Screening: Recommended annual dental exams for proper oral hygiene  Diabetic Foot Exam: N/A  Community Resource Referral / Chronic Care Management: CRR required this visit?  No   CCM required this visit?  No     Plan:     I have personally reviewed and noted the following in the patient's chart:   Medical and social history Use of alcohol, tobacco or illicit drugs  Current medications and supplements including opioid prescriptions. Patient is not currently taking opioid prescriptions. Functional ability and status Nutritional status Physical activity Advanced directives List of other physicians Hospitalizations, surgeries, and ER visits in previous 12 months Vitals Screenings to include cognitive, depression, and falls Referrals and appointments  In addition, I have reviewed and discussed with patient certain preventive protocols, quality metrics, and best practice recommendations. A written personalized care plan for preventive services as well as general preventive health recommendations were provided to patient.     Mickeal Needy, LPN   16/05/958   After Visit Summary: (MyChart) Due to this being a telephonic visit, the after visit summary with patients personalized plan was offered to patient via MyChart   Nurse Notes: Normal cognitive status assessed by direct observation via telephone conversation by this Nurse Health Advisor. No abnormalities found.

## 2023-04-07 ENCOUNTER — Encounter: Payer: Self-pay | Admitting: Emergency Medicine

## 2023-04-07 ENCOUNTER — Ambulatory Visit: Payer: Medicare Other | Admitting: Emergency Medicine

## 2023-04-07 VITALS — BP 126/82 | HR 63 | Temp 98.2°F | Ht 68.0 in | Wt 218.0 lb

## 2023-04-07 DIAGNOSIS — K760 Fatty (change of) liver, not elsewhere classified: Secondary | ICD-10-CM | POA: Diagnosis not present

## 2023-04-07 DIAGNOSIS — R7303 Prediabetes: Secondary | ICD-10-CM

## 2023-04-07 DIAGNOSIS — E785 Hyperlipidemia, unspecified: Secondary | ICD-10-CM | POA: Diagnosis not present

## 2023-04-07 NOTE — Assessment & Plan Note (Signed)
Chronic stable condition Diet and nutrition discussed Last lipid profile showed increased triglycerides and normal cholesterol Continues rosuvastatin 10 mg daily.

## 2023-04-07 NOTE — Assessment & Plan Note (Signed)
Hemoglobin A1c of 5.3. Stable chronic condition Cardiovascular risks associated with diabetes discussed Diet and nutrition discussed Benefits of exercise discussed

## 2023-04-07 NOTE — Patient Instructions (Signed)
Health Maintenance, Male Adopting a healthy lifestyle and getting preventive care are important in promoting health and wellness. Ask your health care provider about: The right schedule for you to have regular tests and exams. Things you can do on your own to prevent diseases and keep yourself healthy. What should I know about diet, weight, and exercise? Eat a healthy diet  Eat a diet that includes plenty of vegetables, fruits, low-fat dairy products, and lean protein. Do not eat a lot of foods that are high in solid fats, added sugars, or sodium. Maintain a healthy weight Body mass index (BMI) is a measurement that can be used to identify possible weight problems. It estimates body fat based on height and weight. Your health care provider can help determine your BMI and help you achieve or maintain a healthy weight. Get regular exercise Get regular exercise. This is one of the most important things you can do for your health. Most adults should: Exercise for at least 150 minutes each week. The exercise should increase your heart rate and make you sweat (moderate-intensity exercise). Do strengthening exercises at least twice a week. This is in addition to the moderate-intensity exercise. Spend less time sitting. Even light physical activity can be beneficial. Watch cholesterol and blood lipids Have your blood tested for lipids and cholesterol at 45 years of age, then have this test every 5 years. You may need to have your cholesterol levels checked more often if: Your lipid or cholesterol levels are high. You are older than 45 years of age. You are at high risk for heart disease. What should I know about cancer screening? Many types of cancers can be detected early and may often be prevented. Depending on your health history and family history, you may need to have cancer screening at various ages. This may include screening for: Colorectal cancer. Prostate cancer. Skin cancer. Lung  cancer. What should I know about heart disease, diabetes, and high blood pressure? Blood pressure and heart disease High blood pressure causes heart disease and increases the risk of stroke. This is more likely to develop in people who have high blood pressure readings or are overweight. Talk with your health care provider about your target blood pressure readings. Have your blood pressure checked: Every 3-5 years if you are 18-39 years of age. Every year if you are 40 years old or older. If you are between the ages of 65 and 75 and are a current or former smoker, ask your health care provider if you should have a one-time screening for abdominal aortic aneurysm (AAA). Diabetes Have regular diabetes screenings. This checks your fasting blood sugar level. Have the screening done: Once every three years after age 45 if you are at a normal weight and have a low risk for diabetes. More often and at a younger age if you are overweight or have a high risk for diabetes. What should I know about preventing infection? Hepatitis B If you have a higher risk for hepatitis B, you should be screened for this virus. Talk with your health care provider to find out if you are at risk for hepatitis B infection. Hepatitis C Blood testing is recommended for: Everyone born from 1945 through 1965. Anyone with known risk factors for hepatitis C. Sexually transmitted infections (STIs) You should be screened each year for STIs, including gonorrhea and chlamydia, if: You are sexually active and are younger than 45 years of age. You are older than 45 years of age and your   health care provider tells you that you are at risk for this type of infection. Your sexual activity has changed since you were last screened, and you are at increased risk for chlamydia or gonorrhea. Ask your health care provider if you are at risk. Ask your health care provider about whether you are at high risk for HIV. Your health care provider  may recommend a prescription medicine to help prevent HIV infection. If you choose to take medicine to prevent HIV, you should first get tested for HIV. You should then be tested every 3 months for as long as you are taking the medicine. Follow these instructions at home: Alcohol use Do not drink alcohol if your health care provider tells you not to drink. If you drink alcohol: Limit how much you have to 0-2 drinks a day. Know how much alcohol is in your drink. In the [place] one drink equals one 12 oz bottle of beer (355 mL), one 5 oz glass of wine (148 mL), or one 1 oz glass of hard liquor (44 mL). Lifestyle Do not use any products that contain nicotine or tobacco. These products include cigarettes, chewing tobacco, and vaping devices, such as e-cigarettes. If you need help quitting, ask your health care provider. Do not use street drugs. Do not share needles. Ask your health care provider for help if you need support or information about quitting drugs. General instructions Schedule regular health, dental, and eye exams. Stay current with your vaccines. Tell your health care provider if: You often feel depressed. You have ever been abused or do not feel safe at home. Summary Adopting a healthy lifestyle and getting preventive care are important in promoting health and wellness. Follow your health care provider's instructions about healthy diet, exercising, and getting tested or screened for diseases. Follow your health care provider's instructions on monitoring your cholesterol and blood pressure. This information is not intended to replace advice given to you by your health care provider. Make sure you discuss any questions you have with your health care provider. Document Revised: 10/06/2020 Document Reviewed: 10/06/2020 Elsevier Patient Education  2024 [employer]  

## 2023-04-07 NOTE — Progress Notes (Signed)
[name] 47 y.o.   Chief Complaint  Patient presents with   Medical Management of Chronic Issues    6 month f/u. Patient is prepping for colonoscopy tomorrow.     HISTORY OF PRESENT ILLNESS: This is a 47 y.o. male here for 90-month follow-up of chronic medical conditions including dyslipidemia and prediabetes. Overall doing well.  Scheduled for colonoscopy tomorrow. Has no complaints or medical concerns today. Wt Readings from Last 3 Encounters:  04/07/23 218 lb (98.9 kg)  03/07/23 215 lb (97.5 kg)  10/05/22 211 lb 8 oz (95.9 kg)   BP Readings from Last 3 Encounters:  10/05/22 128/84  08/26/22 122/80  04/05/22 112/76     HPI   Prior to Admission medications   Medication Sig Start Date End Date Taking? Authorizing Provider  ascorbic acid (VITAMIN C) 1000 MG tablet Take by mouth.   Yes [provider]  brompheniramine-pseudoephedrine-DM 30-2-10 MG/5ML syrup Take 5 mLs by mouth 4 (four) times daily as needed. 12/19/22  Yes Margaretann Loveless, PA-C  Chlorphen-PE-Acetaminophen 4-10-325 MG TABS Take 1 tablet by mouth every 6 (six) hours as needed. 06/23/22  Yes Daphine Deutscher, Mary-Margaret, FNP  COVID-19 At Home Antigen Test [facility] COVID-19 AG HOME TEST) KIT Use as directed. 12/19/22  Yes Margaretann Loveless, PA-C  cyclobenzaprine (FLEXERIL) 10 MG tablet Take 1 tablet (10 mg total) by mouth 3 (three) times daily as needed for muscle spasms. 10/05/22  Yes Nels Munn, [name], MD  diltiazem (CARDIZEM) 30 MG tablet TAKE 1 TABLET EVERY DAY AS NEEDED FOR RECTAL SPASM PAINS Patient taking differently: Take 30 mg by mouth daily. TAKE 1 TABLET EVERY DAY 07/09/20  Yes Moroni Nester, Moro, MD  GREEN TEA, CAMELLIA SINENSIS, PO Take by mouth daily in the afternoon.   Yes [provider]  HYDROmorphone (DILAUDID) 4 MG tablet Take 4 mg by mouth every 6 (six) hours as needed. 07/28/21  Yes [provider]  ipratropium (ATROVENT) 0.03 % nasal spray Place 2 sprays into  both nostrils every 12 (twelve) hours. 12/19/22  Yes Margaretann Loveless, PA-C  rosuvastatin (CRESTOR) 10 MG tablet Take 1 tablet (10 mg total) by mouth daily. 08/27/22  Yes Little Ishikawa, MD  Semaglutide,0.25 or 0.5MG /DOS, (OZEMPIC, 0.25 OR 0.5 MG/DOSE,) 2 MG/1.5ML SOPN See admin instructions. 09/14/21  Yes [provider]    Allergies  Allergen Reactions   Other Hives and Itching   Hydrocodone Hives and Itching   Oxycodone     Mild Itching, patient can tolerate oxycodone   Percocet [Oxycodone-Acetaminophen]     Itching, patient says he tolerates    Patient Active Problem List   Diagnosis Date Noted   Cervical spine disease 10/05/2022   Cervical paraspinal muscle spasm 10/05/2022   Dyslipidemia 04/05/2022   Hepatic steatosis 09/30/2021   History of tobacco use 02/06/2020   Other hyperlipidemia 07/18/2018   Depression 07/18/2018   Prediabetes 05/17/2018   Vitamin D deficiency 05/17/2018   Hearing loss 09/04/2015   Thrombocytopenia (HCC)    Pancytopenia (HCC) 06/24/2015   Status post lumbar laminectomy 06/10/2015   Class 2 severe obesity with serious comorbidity and body mass index (BMI) of 38.0 to 38.9 in adult [facility]) 11/13/2014    Past Medical History:  Diagnosis Date   Anginal pain (HCC) 04/15/2014   Arthritis    "wrists, knees, lower back" (04/16/2014)   Carpal tunnel syndrome    Chronic bronchitis (HCC)    "probably get it q yr"   Chronic lower back pain  Chronic lower back pain    Constipation    Gall stones    GERD (gastroesophageal reflux disease)    GERD (gastroesophageal reflux disease)    HLD (hyperlipidemia)    Hyperlipidemia    Phreesia 04/14/2020   Joint pain    Meningitis    Plantar fasciitis    Right knee pain    Sciatica     Past Surgical History:  Procedure Laterality Date   CARPAL TUNNEL RELEASE     02/2018 Right arm   CHOLECYSTECTOMY  2011   ESOPHAGOGASTRODUODENOSCOPY N/A 02/27/2013   Procedure:  ESOPHAGOGASTRODUODENOSCOPY (EGD);  Surgeon: Florencia Reasons, MD;  Location: [facility];  Service: Endoscopy;  Laterality: N/A;   KNEE ARTHROSCOPY Right 2013 X 2   KNEE ARTHROSCOPY Right 02/22/2018   LAMINECTOMY AND MICRODISCECTOMY SPINE     LEFT HEART CATHETERIZATION WITH CORONARY ANGIOGRAM N/A 04/17/2014   Procedure: LEFT HEART CATHETERIZATION WITH CORONARY ANGIOGRAM;  Surgeon: Ricki Rodriguez, MD;  Location: [facility]ATH [facility];  Service: Cardiovascular;  Laterality: N/A;   RADIOLOGY WITH ANESTHESIA N/A 06/11/2015   Procedure: MRI LUMBAR WITH AND WITHOUT CONTRAST;  Surgeon: Medication Radiologist, MD;  Location: [facility] OR;  Service: Radiology;  Laterality: N/A;   SPINE SURGERY N/A    Phreesia 04/14/2020    Social History   Socioeconomic History   Marital status: Married    Spouse name: Janelle Floor    Number of children: 2   Years of education: Not on file   Highest education level: Not on file  Occupational History   Occupation: Disabled  Tobacco Use   Smoking status: Former    Current packs/day: 0.00    Average packs/day: 0.5 packs/day for 27.0 years (13.5 ttl pk-yrs)    Types: Cigarettes    Start date: 03/10/1988    Quit date: 03/11/2015    Years since quitting: 8.0   Smokeless tobacco: Former  Building services engineer status: Never Used  Substance and Sexual Activity   Alcohol use: Yes   Drug use: No    Comment: 04/16/2014 "in my teens"   Sexual activity: Yes  Other Topics Concern   Not on file  Social History Narrative   Drinks 2 cups of coffee in the morning.   Social Determinants of Health   Financial Resource Strain: Low Risk  (03/07/2023)   Overall Financial Resource Strain (CARDIA)    Difficulty of Paying Living Expenses: Not hard at all  Food Insecurity: No Food Insecurity (03/07/2023)   Hunger Vital Sign    Worried About Running Out of Food in the Last Year: Never true    Ran Out of Food in the Last Year: Never true  Transportation Needs: No Transportation Needs (03/07/2023)    PRAPARE - Administrator, Civil Service (Medical): No    Lack of Transportation (Non-Medical): No  Physical Activity: Inactive (03/07/2023)   Exercise Vital Sign    Days of Exercise per Week: 0 days    Minutes of Exercise per Session: 0 min  Stress: No Stress Concern Present (03/07/2023)   [employer] of Occupational Health - Occupational Stress Questionnaire    Feeling of Stress : Not at all  Social Connections: Unknown (03/23/2023)   [facility]   Social Network    Social Network: Not on file  Intimate Partner Violence: Unknown (03/23/2023)   Received from [facility]   HITS    Physically Hurt: Not on file    Insult or Talk Down To: Not  on file    Threaten Physical Harm: Not on file    Scream or Curse: Not on file    Family History  Problem Relation Age of Onset   Multiple sclerosis Mother    Obesity Mother    Heart failure Maternal Grandfather    Diabetes Father    Hyperlipidemia Father    Sleep apnea Father    Obesity Father    Fibromyalgia Sister    Schizophrenia Brother    Stroke Maternal Grandmother    Diabetes Paternal Grandmother      Review of Systems  Constitutional: Negative.  Negative for chills and fever.  HENT: Negative.  Negative for congestion and sore throat.   Respiratory: Negative.  Negative for cough and shortness of breath.   Cardiovascular: Negative.  Negative for chest pain and palpitations.  Gastrointestinal:  Negative for abdominal pain, diarrhea, nausea and vomiting.  Genitourinary: Negative.  Negative for dysuria and hematuria.  Skin: Negative.  Negative for rash.  Neurological: Negative.  Negative for dizziness and headaches.  All other systems reviewed and are negative.   Today's Vitals   04/07/23 0804  BP: 126/82  Pulse: 63  Temp: 98.2 F (36.8 C)  TempSrc: Oral  SpO2: 97%  Weight: 218 lb (98.9 kg)  Height: 5\' 8"  (1.727 m)   Body mass index is 33.15 kg/m.   Physical Exam Vitals  reviewed.  Constitutional:      Appearance: Normal appearance.  HENT:     Head: Normocephalic.  Eyes:     Extraocular Movements: Extraocular movements intact.  Cardiovascular:     Rate and Rhythm: Normal rate.  Pulmonary:     Effort: Pulmonary effort is normal.  Skin:    General: Skin is warm and dry.  Neurological:     Mental Status: He is alert and oriented to person, place, and time.  Psychiatric:        Mood and Affect: Mood normal.        Behavior: Behavior normal.      ASSESSMENT & PLAN: A total of 42 minutes was spent with the patient and counseling/coordination of care regarding preparing for this visit, review of most recent office visit notes, review of multiple chronic medical conditions under management, review of most recent blood work results, review of all medications, education on nutrition, cardiovascular risks associated with prediabetes and dyslipidemia, review of health maintenance items, prognosis, documentation and need for follow-up.  Problem List Items Addressed This Visit       Digestive   Hepatic steatosis    Normal liver enzymes last May. Chronic stable condition.  No concerns. Diet and nutrition discussed.        Other   Prediabetes    Hemoglobin A1c of 5.3. Stable chronic condition Cardiovascular risks associated with diabetes discussed Diet and nutrition discussed Benefits of exercise discussed      Dyslipidemia - Primary    Chronic stable condition Diet and nutrition discussed Last lipid profile showed increased triglycerides and normal cholesterol Continues rosuvastatin 10 mg daily.      Patient Instructions  Health Maintenance, Male Adopting a healthy lifestyle and getting preventive care are important in promoting health and wellness. Ask your health care provider about: The right schedule for you to have regular tests and exams. Things you can do on your own to prevent diseases and keep yourself healthy. What should I know  about diet, weight, and exercise? Eat a healthy diet  Eat a diet that includes plenty of vegetables, fruits,  low-fat dairy products, and lean protein. Do not eat a lot of foods that are high in solid fats, added sugars, or sodium. Maintain a healthy weight Body mass index (BMI) is a measurement that can be used to identify possible weight problems. It estimates body fat based on height and weight. Your health care provider can help determine your BMI and help you achieve or maintain a healthy weight. Get regular exercise Get regular exercise. This is one of the most important things you can do for your health. Most adults should: Exercise for at least 150 minutes each week. The exercise should increase your heart rate and make you sweat (moderate-intensity exercise). Do strengthening exercises at least twice a week. This is in addition to the moderate-intensity exercise. Spend less time sitting. Even light physical activity can be beneficial. Watch cholesterol and blood lipids Have your blood tested for lipids and cholesterol at 45 years of age, then have this test every 5 years. You may need to have your cholesterol levels checked more often if: Your lipid or cholesterol levels are high. You are older than 45 years of age. You are at high risk for heart disease. What should I know about cancer screening? Many types of cancers can be detected early and may often be prevented. Depending on your health history and family history, you may need to have cancer screening at various ages. This may include screening for: Colorectal cancer. Prostate cancer. Skin cancer. Lung cancer. What should I know about heart disease, diabetes, and high blood pressure? Blood pressure and heart disease High blood pressure causes heart disease and increases the risk of stroke. This is more likely to develop in people who have high blood pressure readings or are overweight. Talk with your health care provider  about your target blood pressure readings. Have your blood pressure checked: Every 3-5 years if you are 53-17 years of age. Every year if you are 6 years old or older. If you are between the ages of 84 and 37 and are a current or former smoker, ask your health care provider if you should have a one-time screening for abdominal aortic aneurysm (AAA). Diabetes Have regular diabetes screenings. This checks your fasting blood sugar level. Have the screening done: Once every three years after age 95 if you are at a normal weight and have a low risk for diabetes. More often and at a younger age if you are overweight or have a high risk for diabetes. What should I know about preventing infection? Hepatitis B If you have a higher risk for hepatitis B, you should be screened for this virus. Talk with your health care provider to find out if you are at risk for hepatitis B infection. Hepatitis C Blood testing is recommended for: Everyone born from [birth year] through 1963. Anyone with known risk factors for hepatitis C. Sexually transmitted infections (STIs) You should be screened each year for STIs, including gonorrhea and chlamydia, if: You are sexually active and are younger than 45 years of age. You are older than 45 years of age and your health care provider tells you that you are at risk for this type of infection. Your sexual activity has changed since you were last screened, and you are at increased risk for chlamydia or gonorrhea. Ask your health care provider if you are at risk. Ask your health care provider about whether you are at high risk for HIV. Your health care provider may recommend a prescription medicine to help prevent HIV  infection. If you choose to take medicine to prevent HIV, you should first get tested for HIV. You should then be tested every 3 months for as long as you are taking the medicine. Follow these instructions at home: Alcohol use Do not drink alcohol if your health care  provider tells you not to drink. If you drink alcohol: Limit how much you have to 0-2 drinks a day. Know how much alcohol is in your drink. In the [place] one drink equals one 12 oz bottle of beer (355 mL), one 5 oz glass of wine (148 mL), or one 1 oz glass of hard liquor (44 mL). Lifestyle Do not use any products that contain nicotine or tobacco. These products include cigarettes, chewing tobacco, and vaping devices, such as e-cigarettes. If you need help quitting, ask your health care provider. Do not use street drugs. Do not share needles. Ask your health care provider for help if you need support or information about quitting drugs. General instructions Schedule regular health, dental, and eye exams. Stay current with your vaccines. Tell your health care provider if: You often feel depressed. You have ever been abused or do not feel safe at home. Summary Adopting a healthy lifestyle and getting preventive care are important in promoting health and wellness. Follow your health care provider's instructions about healthy diet, exercising, and getting tested or screened for diseases. Follow your health care provider's instructions on monitoring your cholesterol and blood pressure. This information is not intended to replace advice given to you by your health care provider. Make sure you discuss any questions you have with your health care provider. Document Revised: 10/06/2020 Document Reviewed: 10/06/2020 Elsevier Patient Education  2024 Elsevier Inc.     Edwina Barth, MD [facility]

## 2023-04-07 NOTE — Assessment & Plan Note (Signed)
Normal liver enzymes last May. Chronic stable condition.  No concerns. Diet and nutrition discussed.

## 2023-05-17 ENCOUNTER — Other Ambulatory Visit: Payer: Self-pay | Admitting: Radiology

## 2023-05-17 ENCOUNTER — Telehealth: Payer: Self-pay | Admitting: Emergency Medicine

## 2023-05-17 DIAGNOSIS — M62838 Other muscle spasm: Secondary | ICD-10-CM

## 2023-05-17 MED ORDER — CYCLOBENZAPRINE HCL 10 MG PO TABS
10.0000 mg | ORAL_TABLET | Freq: Three times a day (TID) | ORAL | 3 refills | Status: DC | PRN
Start: 2023-05-17 — End: 2023-08-21

## 2023-05-17 NOTE — Telephone Encounter (Signed)
Prescription Request  05/17/2023  LOV: 04/07/2023  What is the name of the medication or equipment? cyclobenzaprine (FLEXERIL) 10 MG tablet   Have you contacted your pharmacy to request a refill? No   Which pharmacy would you like this sent to?  [facility] [place], [place] - [address] [place] [address] Phone: [phone] Fax: [phone]    Patient notified that their request is being sent to the clinical staff for review and that they should receive a response within 2 business days.   Please advise at Mobile [phone] (mobile)

## 2023-07-24 ENCOUNTER — Telehealth: Payer: Medicare Other | Admitting: Family

## 2023-07-24 DIAGNOSIS — R6889 Other general symptoms and signs: Secondary | ICD-10-CM | POA: Diagnosis not present

## 2023-07-24 DIAGNOSIS — Z20828 Contact with and (suspected) exposure to other viral communicable diseases: Secondary | ICD-10-CM

## 2023-07-24 MED ORDER — PROMETHAZINE-DM 6.25-15 MG/5ML PO SYRP: 5.0000 mL | ORAL_SOLUTION | Freq: Three times a day (TID) | ORAL | 0 refills | Status: DC | PRN

## 2023-07-24 MED ORDER — BENZONATATE 100 MG PO CAPS: 100.0000 mg | ORAL_CAPSULE | Freq: Three times a day (TID) | ORAL | 0 refills | Status: DC | PRN

## 2023-07-24 MED ORDER — XOFLUZA (80 MG DOSE) 2 X 40 MG PO TBPK
80.0000 mg | ORAL_TABLET | Freq: Once | ORAL | 0 refills | Status: AC
Start: 2023-07-24 — End: 2023-07-24

## 2023-07-24 NOTE — Progress Notes (Signed)
 Virtual Visit Consent   Bruce Morales, you are scheduled for a virtual visit with a [facility] provider today. Just as with appointments in the office, your consent must be obtained to participate. Your consent will be active for this visit and any virtual visit you may have with one of our providers in the next 365 days. If you have a MyChart account, a copy of this consent can be sent to you electronically.  As this is a virtual visit, video technology does not allow for your provider to perform a traditional examination. This may limit your provider's ability to fully assess your condition. If your provider identifies any concerns that need to be evaluated in person or the need to arrange testing (such as labs, EKG, etc.), we will make arrangements to do so. Although advances in technology are sophisticated, we cannot ensure that it will always work on either your end or our end. If the connection with a video visit is poor, the visit may have to be switched to a telephone visit. With either a video or telephone visit, we are not always able to ensure that we have a secure connection.  By engaging in this virtual visit, you consent to the provision of healthcare and authorize for your insurance to be billed (if applicable) for the services provided during this visit. Depending on your insurance coverage, you may receive a charge related to this service.  I need to obtain your verbal consent now. Are you willing to proceed with your visit today? Bruce Morales has provided verbal consent on 07/24/2023 for a virtual visit (video or telephone). Bruce Rodney, FNP  Date: 07/24/2023 1:13 PM   Virtual Visit via Video Note   I, Bruce Morales, connected with  Bruce Morales  ([id number], January 11, 1976) on 07/24/23 at  1:15 PM EST by a video-enabled telemedicine application and verified that I am speaking with the correct person using two identifiers.  Location: Patient: Virtual Visit Location  Patient: Home Provider: Virtual Visit Location Provider: Home Office   I discussed the limitations of evaluation and management by telemedicine and the availability of in person appointments. The patient expressed understanding and agreed to proceed.    History of Present Illness: Bruce Morales is a 48 y.o. who identifies as a male who was assigned male at birth, and is being seen today for flu like symptoms that started last night .  HPI: Influenza This is a new problem. The current episode started yesterday. The problem occurs constantly. The problem has been gradually worsening. Associated symptoms include chills, congestion, coughing, fatigue, a fever, headaches, myalgias and a sore throat. Pertinent negatives include no nausea or vomiting. He has tried rest for the symptoms. The treatment provided mild relief.    Problems:  Patient Active Problem List   Diagnosis Date Noted   Cervical spine disease 10/05/2022   Cervical paraspinal muscle spasm 10/05/2022   Dyslipidemia 04/05/2022   Hepatic steatosis 09/30/2021   History of tobacco use 02/06/2020   Other hyperlipidemia 07/18/2018   Depression 07/18/2018   Prediabetes 05/17/2018   Vitamin D deficiency 05/17/2018   Hearing loss 09/04/2015   Thrombocytopenia (HCC)    Pancytopenia (HCC) 06/24/2015   Status post lumbar laminectomy 06/10/2015   Class 2 severe obesity with serious comorbidity and body mass index (BMI) of 38.0 to 38.9 in adult [facility]) 11/13/2014    Allergies:  Allergies  Allergen Reactions   Other Hives and Itching   Hydrocodone Hives and Itching  Oxycodone     Mild Itching, patient can tolerate oxycodone   Percocet [Oxycodone-Acetaminophen]     Itching, patient says he tolerates   Medications:  Current Outpatient Medications:    Baloxavir Marboxil,80 MG Dose, (XOFLUZA, 80 MG DOSE,) 2 x 40 MG TBPK, Take 80 mg by mouth once for 1 dose., Disp: 1 each, Rfl: 0   benzonatate (TESSALON PERLES) 100 MG capsule, Take  1 capsule (100 mg total) by mouth 3 (three) times daily as needed., Disp: 20 capsule, Rfl: 0   promethazine-dextromethorphan (PROMETHAZINE-DM) 6.25-15 MG/5ML syrup, Take 5 mLs by mouth 3 (three) times daily as needed for cough., Disp: 118 mL, Rfl: 0   ascorbic acid (VITAMIN C) 1000 MG tablet, Take by mouth., Disp: , Rfl:    cyclobenzaprine (FLEXERIL) 10 MG tablet, Take 1 tablet (10 mg total) by mouth 3 (three) times daily as needed for muscle spasms., Disp: 30 tablet, Rfl: 3   diltiazem (CARDIZEM) 30 MG tablet, TAKE 1 TABLET EVERY DAY AS NEEDED FOR RECTAL SPASM PAINS (Patient taking differently: Take 30 mg by mouth daily. TAKE 1 TABLET EVERY DAY), Disp: 90 tablet, Rfl: 1   GREEN TEA, CAMELLIA SINENSIS, PO, Take by mouth daily in the afternoon., Disp: , Rfl:    HYDROmorphone (DILAUDID) 4 MG tablet, Take 4 mg by mouth every 6 (six) hours as needed., Disp: , Rfl:    ipratropium (ATROVENT) 0.03 % nasal spray, Place 2 sprays into both nostrils every 12 (twelve) hours., Disp: 30 mL, Rfl: 0   rosuvastatin (CRESTOR) 10 MG tablet, Take 1 tablet (10 mg total) by mouth daily., Disp: 90 tablet, Rfl: 3   Semaglutide,0.25 or 0.5MG /DOS, (OZEMPIC, 0.25 OR 0.5 MG/DOSE,) 2 MG/1.5ML SOPN, See admin instructions., Disp: , Rfl:   Observations/Objective: Patient is well-developed, well-nourished in no acute distress.  Resting comfortably  at home.  Head is normocephalic, atraumatic.  No labored breathing.  Speech is clear and coherent with logical content.  Patient is alert and oriented at baseline.  Nasal congestion, hoarse voice   Assessment and Plan: 1. Flu-like symptoms (Primary) - Baloxavir Marboxil,80 MG Dose, (XOFLUZA, 80 MG DOSE,) 2 x 40 MG TBPK; Take 80 mg by mouth once for 1 dose.  Dispense: 1 each; Refill: 0 - benzonatate (TESSALON PERLES) 100 MG capsule; Take 1 capsule (100 mg total) by mouth 3 (three) times daily as needed.  Dispense: 20 capsule; Refill: 0 - promethazine-dextromethorphan  (PROMETHAZINE-DM) 6.25-15 MG/5ML syrup; Take 5 mLs by mouth 3 (three) times daily as needed for cough.  Dispense: 118 mL; Refill: 0  2. Exposure to the flu - Baloxavir Marboxil,80 MG Dose, (XOFLUZA, 80 MG DOSE,) 2 x 40 MG TBPK; Take 80 mg by mouth once for 1 dose.  Dispense: 1 each; Refill: 0 - benzonatate (TESSALON PERLES) 100 MG capsule; Take 1 capsule (100 mg total) by mouth 3 (three) times daily as needed.  Dispense: 20 capsule; Refill: 0 - promethazine-dextromethorphan (PROMETHAZINE-DM) 6.25-15 MG/5ML syrup; Take 5 mLs by mouth 3 (three) times daily as needed for cough.  Dispense: 118 mL; Refill: 0  Rest Force fluids  Droplet precautions  Tylenol as needed Mucinex  Follow up if symptoms worsen or do not improve   Follow Up Instructions: I discussed the assessment and treatment plan with the patient. The patient was provided an opportunity to ask questions and all were answered. The patient agreed with the plan and demonstrated an understanding of the instructions.  A copy of instructions were sent to the patient via MyChart  unless otherwise noted below.    The patient was advised to call back or seek an in-person evaluation if the symptoms worsen or if the condition fails to improve as anticipated.    Bruce Rodney, FNP

## 2023-07-25 ENCOUNTER — Telehealth: Payer: Self-pay | Admitting: Emergency Medicine

## 2023-07-25 ENCOUNTER — Telehealth: Payer: Medicare Other

## 2023-07-25 NOTE — Telephone Encounter (Signed)
 Copied from CRM [id number]. Topic: Clinical - Prescription Issue >> Jul 25, 2023  1:41 PM Irine Seal wrote: Reason for CRM: patient states that they "Need an urgent request for prior authorization for yesterdays prescription of Elisha Ponder for [employer] through [facility] [phone] please"  They were seen virtually yesterday for flu like symptoms- he stated he has no idea how to handle this or who to speak with in regards to this prior authorization so I offered to send message on his behalf for further clarification, callback [phone]

## 2023-08-20 ENCOUNTER — Other Ambulatory Visit: Payer: Self-pay | Admitting: Emergency Medicine

## 2023-08-20 DIAGNOSIS — M62838 Other muscle spasm: Secondary | ICD-10-CM

## 2023-10-05 ENCOUNTER — Ambulatory Visit: Payer: Medicare Other | Admitting: Emergency Medicine

## 2023-10-05 ENCOUNTER — Encounter: Payer: Self-pay | Admitting: Emergency Medicine

## 2023-10-05 VITALS — BP 124/82 | HR 54 | Temp 98.3°F | Ht 68.0 in | Wt 217.5 lb

## 2023-10-05 DIAGNOSIS — K760 Fatty (change of) liver, not elsewhere classified: Secondary | ICD-10-CM

## 2023-10-05 DIAGNOSIS — E785 Hyperlipidemia, unspecified: Secondary | ICD-10-CM

## 2023-10-05 DIAGNOSIS — E66812 Obesity, class 2: Secondary | ICD-10-CM | POA: Diagnosis not present

## 2023-10-05 DIAGNOSIS — R7303 Prediabetes: Secondary | ICD-10-CM | POA: Diagnosis not present

## 2023-10-05 DIAGNOSIS — Z6838 Body mass index (BMI) 38.0-38.9, adult: Secondary | ICD-10-CM

## 2023-10-05 LAB — CBC WITH DIFFERENTIAL/PLATELET
Basophils Absolute: 0 10*3/uL (ref 0.0–0.1)
Basophils Relative: 0.5 % (ref 0.0–3.0)
Eosinophils Absolute: 0.1 10*3/uL (ref 0.0–0.7)
Eosinophils Relative: 2.3 % (ref 0.0–5.0)
HCT: 44.4 % (ref 39.0–52.0)
Hemoglobin: 15.2 g/dL (ref 13.0–17.0)
Lymphocytes Relative: 37.8 % (ref 12.0–46.0)
Lymphs Abs: 2 10*3/uL (ref 0.7–4.0)
MCHC: 34.1 g/dL (ref 30.0–36.0)
MCV: 88.5 fl (ref 78.0–100.0)
Monocytes Absolute: 0.5 10*3/uL (ref 0.1–1.0)
Monocytes Relative: 8.8 % (ref 3.0–12.0)
Neutro Abs: 2.7 10*3/uL (ref 1.4–7.7)
Neutrophils Relative %: 50.6 % (ref 43.0–77.0)
Platelets: 130 10*3/uL — ABNORMAL LOW (ref 150.0–400.0)
RBC: 5.02 Mil/uL (ref 4.22–5.81)
RDW: 13 % (ref 11.5–15.5)
WBC: 5.3 10*3/uL (ref 4.0–10.5)

## 2023-10-05 LAB — COMPREHENSIVE METABOLIC PANEL WITH GFR
ALT: 19 U/L (ref 0–53)
AST: 16 U/L (ref 0–37)
Albumin: 4.8 g/dL (ref 3.5–5.2)
Alkaline Phosphatase: 52 U/L (ref 39–117)
BUN: 18 mg/dL (ref 6–23)
CO2: 29 meq/L (ref 19–32)
Calcium: 9.4 mg/dL (ref 8.4–10.5)
Chloride: 103 meq/L (ref 96–112)
Creatinine, Ser: 1.04 mg/dL (ref 0.40–1.50)
GFR: 86.69 mL/min (ref >60.00)
Glucose, Bld: 84 mg/dL (ref 70–99)
Potassium: 4.6 meq/L (ref 3.5–5.1)
Sodium: 138 meq/L (ref 135–145)
Total Bilirubin: 0.8 mg/dL (ref 0.2–1.2)
Total Protein: 7.1 g/dL (ref 6.0–8.3)

## 2023-10-05 LAB — LIPID PANEL
Cholesterol: 214 mg/dL — ABNORMAL HIGH (ref 0–200)
HDL: 34.4 mg/dL — ABNORMAL LOW (ref >39.00)
LDL Cholesterol: 148 mg/dL — ABNORMAL HIGH (ref 0–99)
NonHDL: 179.35
Total CHOL/HDL Ratio: 6
Triglycerides: 155 mg/dL — ABNORMAL HIGH (ref 0.0–149.0)
VLDL: 31 mg/dL (ref 0.0–40.0)

## 2023-10-05 LAB — HEMOGLOBIN A1C: Hgb A1c MFr Bld: 5.2 % (ref 4.6–6.5)

## 2023-10-05 NOTE — Patient Instructions (Signed)
 Health Maintenance, Male  Adopting a healthy lifestyle and getting preventive care are important in promoting health and wellness. Ask your health care provider about:  The right schedule for you to have regular tests and exams.  Things you can do on your own to prevent diseases and keep yourself healthy.  What should I know about diet, weight, and exercise?  Eat a healthy diet    Eat a diet that includes plenty of vegetables, fruits, low-fat dairy products, and lean protein.  Do not eat a lot of foods that are high in solid fats, added sugars, or sodium.  Maintain a healthy weight  Body mass index (BMI) is a measurement that can be used to identify possible weight problems. It estimates body fat based on height and weight. Your health care provider can help determine your BMI and help you achieve or maintain a healthy weight.  Get regular exercise  Get regular exercise. This is one of the most important things you can do for your health. Most adults should:  Exercise for at least 150 minutes each week. The exercise should increase your heart rate and make you sweat (moderate-intensity exercise).  Do strengthening exercises at least twice a week. This is in addition to the moderate-intensity exercise.  Spend less time sitting. Even light physical activity can be beneficial.  Watch cholesterol and blood lipids  Have your blood tested for lipids and cholesterol at 46 years of age, then have this test every 5 years.  You may need to have your cholesterol levels checked more often if:  Your lipid or cholesterol levels are high.  You are older than 46 years of age.  You are at high risk for heart disease.  What should I know about cancer screening?  Many types of cancers can be detected early and may often be prevented. Depending on your health history and family history, you may need to have cancer screening at various ages. This may include screening for:  Colorectal cancer.  Prostate cancer.  Skin cancer.  Lung  cancer.  What should I know about heart disease, diabetes, and high blood pressure?  Blood pressure and heart disease  High blood pressure causes heart disease and increases the risk of stroke. This is more likely to develop in people who have high blood pressure readings or are overweight.  Talk with your health care provider about your target blood pressure readings.  Have your blood pressure checked:  Every 3-5 years if you are 9-95 years of age.  Every year if you are 85 years old or older.  If you are between the ages of 29 and 29 and are a current or former smoker, ask your health care provider if you should have a one-time screening for abdominal aortic aneurysm (AAA).  Diabetes  Have regular diabetes screenings. This checks your fasting blood sugar level. Have the screening done:  Once every three years after age 23 if you are at a normal weight and have a low risk for diabetes.  More often and at a younger age if you are overweight or have a high risk for diabetes.  What should I know about preventing infection?  Hepatitis B  If you have a higher risk for hepatitis B, you should be screened for this virus. Talk with your health care provider to find out if you are at risk for hepatitis B infection.  Hepatitis C  Blood testing is recommended for:  Everyone born from [birth year] through 1965.  Anyone  with known risk factors for hepatitis C.  Sexually transmitted infections (STIs)  You should be screened each year for STIs, including gonorrhea and chlamydia, if:  You are sexually active and are younger than 46 years of age.  You are older than 46 years of age and your health care provider tells you that you are at risk for this type of infection.  Your sexual activity has changed since you were last screened, and you are at increased risk for chlamydia or gonorrhea. Ask your health care provider if you are at risk.  Ask your health care provider about whether you are at high risk for HIV. Your health care provider  may recommend a prescription medicine to help prevent HIV infection. If you choose to take medicine to prevent HIV, you should first get tested for HIV. You should then be tested every 3 months for as long as you are taking the medicine.  Follow these instructions at home:  Alcohol use  Do not drink alcohol if your health care provider tells you not to drink.  If you drink alcohol:  Limit how much you have to 0-2 drinks a day.  Know how much alcohol is in your drink. In the [place] one drink equals one 12 oz bottle of beer (355 mL), one 5 oz glass of wine (148 mL), or one 1 oz glass of hard liquor (44 mL).  Lifestyle  Do not use any products that contain nicotine or tobacco. These products include cigarettes, chewing tobacco, and vaping devices, such as e-cigarettes. If you need help quitting, ask your health care provider.  Do not use street drugs.  Do not share needles.  Ask your health care provider for help if you need support or information about quitting drugs.  General instructions  Schedule regular health, dental, and eye exams.  Stay current with your vaccines.  Tell your health care provider if:  You often feel depressed.  You have ever been abused or do not feel safe at home.  Summary  Adopting a healthy lifestyle and getting preventive care are important in promoting health and wellness.  Follow your health care provider's instructions about healthy diet, exercising, and getting tested or screened for diseases.  Follow your health care provider's instructions on monitoring your cholesterol and blood pressure.  This information is not intended to replace advice given to you by your health care provider. Make sure you discuss any questions you have with your health care provider.  Document Revised: 10/06/2020 Document Reviewed: 10/06/2020  Elsevier Patient Education  2024 [employer].

## 2023-10-05 NOTE — Assessment & Plan Note (Signed)
Eating better and exercising more Advised to decrease amount of daily carbohydrate intake and daily calories and increase amount of plant-based protein in his diet Benefits of exercise discussed.

## 2023-10-05 NOTE — Assessment & Plan Note (Signed)
 Normal liver enzymes last May. Chronic stable condition.  No concerns. Diet and nutrition discussed.

## 2023-10-05 NOTE — Assessment & Plan Note (Signed)
 Chronic stable condition Diet and nutrition discussed Last lipid profile showed increased triglycerides and normal cholesterol Continues rosuvastatin 10 mg daily.

## 2023-10-05 NOTE — Assessment & Plan Note (Signed)
 Hemoglobin A1c of 5.3. Stable chronic condition Cardiovascular risks associated with diabetes discussed Diet and nutrition discussed Benefits of exercise discussed

## 2023-10-05 NOTE — Progress Notes (Signed)
 Bruce Morales 48 y.o.   Chief Complaint  Patient presents with   Medical Management of Chronic Issues    6 month follow up, pain in the back and neck     HISTORY OF PRESENT ILLNESS: This is a 48 y.o. male A1A here for 42-month follow-up of chronic conditions. Overall doing well.  Has no complaints or today.  HPI   Prior to Admission medications   Medication Sig Start Date End Date Taking? Authorizing Provider  ascorbic acid (VITAMIN C) 1000 MG tablet Take by mouth.   Yes [provider]  benzonatate  (TESSALON  PERLES) 100 MG capsule Take 1 capsule (100 mg total) by mouth 3 (three) times daily as needed. 07/24/23  Yes Hawks, Christy A, FNP  cyclobenzaprine  (FLEXERIL ) 10 MG tablet Take 1 tablet (10 mg total) by mouth 3 (three) times daily as needed for muscle spasms. 08/21/23  Yes Adrieana Fennelly, Isidro Margo, MD  diltiazem  (CARDIZEM ) 30 MG tablet TAKE 1 TABLET EVERY DAY AS NEEDED FOR RECTAL SPASM PAINS Patient taking differently: Take 30 mg by mouth daily. TAKE 1 TABLET EVERY DAY 07/09/20  Yes Dorance Spink, Magazine, MD  GREEN TEA, CAMELLIA SINENSIS, PO Take by mouth daily in the afternoon.   Yes [provider]  HYDROmorphone  (DILAUDID ) 4 MG tablet Take 4 mg by mouth every 6 (six) hours as needed. 07/28/21  Yes [provider]  ipratropium (ATROVENT ) 0.03 % nasal spray Place 2 sprays into both nostrils every 12 (twelve) hours. 12/19/22  Yes [name], PA-C  promethazine -dextromethorphan (PROMETHAZINE -DM) 6.25-15 MG/5ML syrup Take 5 mLs by mouth 3 (three) times daily as needed for cough. 07/24/23  Yes Hawks, Christy A, FNP  rosuvastatin  (CRESTOR ) 10 MG tablet Take 1 tablet (10 mg total) by mouth daily. 08/27/22  Yes Wendie Hamburg, MD  Semaglutide,0.25 or 0.5MG /DOS, (OZEMPIC, 0.25 OR 0.5 MG/DOSE,) 2 MG/1.5ML SOPN See admin instructions. 09/14/21  Yes [provider]    Allergies  Allergen Reactions   Other Hives and Itching   Hydrocodone   Hives and Itching   Oxycodone      Mild Itching, patient can tolerate oxycodone    Percocet [Oxycodone -Acetaminophen ]     Itching, patient says he tolerates    Patient Active Problem List   Diagnosis Date Noted   Cervical spine disease 10/05/2022   Dyslipidemia 04/05/2022   Hepatic steatosis 09/30/2021   History of tobacco use 02/06/2020   Other hyperlipidemia 07/18/2018   Depression 07/18/2018   Prediabetes 05/17/2018   Vitamin D  deficiency 05/17/2018   Hearing loss 09/04/2015   Thrombocytopenia (HCC)    Pancytopenia (HCC) 06/24/2015   Status post lumbar laminectomy 06/10/2015   Class 2 severe obesity with serious comorbidity and body mass index (BMI) of 38.0 to 38.9 in adult [facility]) 11/13/2014    Past Medical History:  Diagnosis Date   Anginal pain (HCC) 04/15/2014   Arthritis    "wrists, knees, lower back" (04/16/2014)   Carpal tunnel syndrome    Chronic bronchitis (HCC)    "probably get it q yr"   Chronic lower back pain    Chronic lower back pain    Constipation    Gall stones    GERD (gastroesophageal reflux disease)    GERD (gastroesophageal reflux disease)    HLD (hyperlipidemia)    Hyperlipidemia    Phreesia 04/14/2020   Joint pain    Meningitis    Plantar fasciitis    Right knee pain    Sciatica     Past Surgical History:  Procedure Laterality Date   CARPAL TUNNEL RELEASE     02/2018 Right arm   CHOLECYSTECTOMY  2011   ESOPHAGOGASTRODUODENOSCOPY N/A 02/27/2013   Procedure: ESOPHAGOGASTRODUODENOSCOPY (EGD);  Surgeon: Brice Campi, MD;  Location: [facility];  Service: Endoscopy;  Laterality: N/A;   KNEE ARTHROSCOPY Right 2013 X 2   KNEE ARTHROSCOPY Right 02/22/2018   LAMINECTOMY AND MICRODISCECTOMY SPINE     LEFT HEART CATHETERIZATION WITH CORONARY ANGIOGRAM N/A 04/17/2014   Procedure: LEFT HEART CATHETERIZATION WITH CORONARY ANGIOGRAM;  Surgeon: Darrold Emms, MD;  Location: [facility] CATH [facility];  Service: Cardiovascular;  Laterality: N/A;   RADIOLOGY  WITH ANESTHESIA N/A 06/11/2015   Procedure: MRI LUMBAR WITH AND WITHOUT CONTRAST;  Surgeon: Medication Radiologist, MD;  Location: [facility] OR;  Service: Radiology;  Laterality: N/A;   SPINE SURGERY N/A    Phreesia 04/14/2020    Social History   Socioeconomic History   Marital status: Married    Spouse name: Leon Rajas    Number of children: 2   Years of education: Not on file   Highest education level: Not on file  Occupational History   Occupation: Disabled  Tobacco Use   Smoking status: Former    Current packs/day: 0.00    Average packs/day: 0.5 packs/day for 27.0 years (13.5 ttl pk-yrs)    Types: Cigarettes    Start date: 03/10/1988    Quit date: 03/11/2015    Years since quitting: 8.5   Smokeless tobacco: Former  Building services engineer status: Never Used  Substance and Sexual Activity   Alcohol use: Yes   Drug use: No    Comment: 04/16/2014 "in my teens"   Sexual activity: Yes  Other Topics Concern   Not on file  Social History Narrative   Drinks 2 cups of coffee in the morning.   Social Drivers of Corporate investment banker Strain: Low Risk  (03/07/2023)   Overall Financial Resource Strain (CARDIA)    Difficulty of Paying Living Expenses: Not hard at all  Food Insecurity: No Food Insecurity (03/07/2023)   Hunger Vital Sign    Worried About Running Out of Food in the Last Year: Never true    Ran Out of Food in the Last Year: Never true  Transportation Needs: No Transportation Needs (03/07/2023)   PRAPARE - Administrator, Civil Service (Medical): No    Lack of Transportation (Non-Medical): No  Physical Activity: Inactive (03/07/2023)   Exercise Vital Sign    Days of Exercise per Week: 0 days    Minutes of Exercise per Session: 0 min  Stress: No Stress Concern Present (03/07/2023)   [employer] of Occupational Health - Occupational Stress Questionnaire    Feeling of Stress : Not at all  Social Connections: Unknown (03/23/2023)   [facility] from [facility]    Social Network    Social Network: Not on file  Intimate Partner Violence: Unknown (03/23/2023)   Received from [facility]   HITS    Physically Hurt: Not on file    Insult or Talk Down To: Not on file    Threaten Physical Harm: Not on file    Scream or Curse: Not on file    Family History  Problem Relation Age of Onset   Multiple sclerosis Mother    Obesity Mother    Heart failure Maternal Grandfather    Diabetes Father    Hyperlipidemia Father    Sleep apnea Father    Obesity Father  Fibromyalgia Sister    Schizophrenia Brother    Stroke Maternal Grandmother    Diabetes Paternal Grandmother      Review of Systems  Constitutional: Negative.  Negative for chills and fever.  HENT: Negative.  Negative for congestion and sore throat.   Respiratory: Negative.  Negative for cough and shortness of breath.   Cardiovascular: Negative.  Negative for chest pain and palpitations.  Gastrointestinal:  Negative for abdominal pain, diarrhea, nausea and vomiting.  Genitourinary: Negative.  Negative for dysuria and hematuria.  Musculoskeletal:  Positive for back pain and neck pain.  Skin: Negative.  Negative for rash.  Neurological: Negative.  Negative for dizziness and headaches.  All other systems reviewed and are negative.   Today's Vitals   10/05/23 0806  BP: 124/82  Pulse: (!) 54  Temp: 98.3 F (36.8 C)  TempSrc: Temporal  SpO2: 99%  Weight: 217 lb 8 oz (98.7 kg)  Height: 5\' 8"  (1.727 m)   Body mass index is 33.07 kg/m.   Physical Exam Vitals reviewed.  Constitutional:      Appearance: Normal appearance.  HENT:     Head: Normocephalic.     Mouth/Throat:     Mouth: Mucous membranes are moist.     Pharynx: Oropharynx is clear.  Eyes:     Extraocular Movements: Extraocular movements intact.     Pupils: Pupils are equal, round, and reactive to light.  Cardiovascular:     Rate and Rhythm: Normal rate and regular rhythm.     Pulses: Normal pulses.     Heart  sounds: Normal heart sounds.  Pulmonary:     Effort: Pulmonary effort is normal.     Breath sounds: Normal breath sounds.  Skin:    General: Skin is warm and dry.  Neurological:     Mental Status: He is alert and oriented to person, place, and time.  Psychiatric:        Behavior: Behavior normal.      ASSESSMENT & PLAN: A total of 45 minutes was spent with the patient and counseling/coordination of care regarding preparing for this visit, review of most recent office visit notes, review of multiple chronic medical conditions and their management, review of all medications, review of most recent bloodwork results, review of health maintenance items, education on nutrition, prognosis, documentation, and need for follow up.   Problem List Items Addressed This Visit       Digestive   Hepatic steatosis   Normal liver enzymes last May. Chronic stable condition.  No concerns. Diet and nutrition discussed.      Relevant Orders   Comprehensive metabolic panel with GFR   CBC with Differential/Platelet   Hemoglobin A1c   Lipid panel     Other   Class 2 severe obesity with serious comorbidity and body mass index (BMI) of 38.0 to 38.9 in adult [facility])   Eating better and exercising more Advised to decrease amount of daily carbohydrate intake and daily calories and increase amount of plant-based protein in his diet Benefits of exercise discussed.      Relevant Orders   Comprehensive metabolic panel with GFR   CBC with Differential/Platelet   Hemoglobin A1c   Lipid panel   Prediabetes   Hemoglobin A1c of 5.3. Stable chronic condition Cardiovascular risks associated with diabetes discussed Diet and nutrition discussed Benefits of exercise discussed      Relevant Orders   Comprehensive metabolic panel with GFR   CBC with Differential/Platelet   Hemoglobin A1c  Lipid panel   Dyslipidemia - Primary   Chronic stable condition Diet and nutrition discussed Last lipid profile  showed increased triglycerides and normal cholesterol Continues rosuvastatin  10 mg daily.      Relevant Orders   Comprehensive metabolic panel with GFR   CBC with Differential/Platelet   Hemoglobin A1c   Lipid panel   Patient Instructions  Health Maintenance, Male Adopting a healthy lifestyle and getting preventive care are important in promoting health and wellness. Ask your health care provider about: The right schedule for you to have regular tests and exams. Things you can do on your own to prevent diseases and keep yourself healthy. What should I know about diet, weight, and exercise? Eat a healthy diet  Eat a diet that includes plenty of vegetables, fruits, low-fat dairy products, and lean protein. Do not eat a lot of foods that are high in solid fats, added sugars, or sodium. Maintain a healthy weight Body mass index (BMI) is a measurement that can be used to identify possible weight problems. It estimates body fat based on height and weight. Your health care provider can help determine your BMI and help you achieve or maintain a healthy weight. Get regular exercise Get regular exercise. This is one of the most important things you can do for your health. Most adults should: Exercise for at least 150 minutes each week. The exercise should increase your heart rate and make you sweat (moderate-intensity exercise). Do strengthening exercises at least twice a week. This is in addition to the moderate-intensity exercise. Spend less time sitting. Even light physical activity can be beneficial. Watch cholesterol and blood lipids Have your blood tested for lipids and cholesterol at 46 years of age, then have this test every 5 years. You may need to have your cholesterol levels checked more often if: Your lipid or cholesterol levels are high. You are older than 46 years of age. You are at high risk for heart disease. What should I know about cancer screening? Many types of cancers can  be detected early and may often be prevented. Depending on your health history and family history, you may need to have cancer screening at various ages. This may include screening for: Colorectal cancer. Prostate cancer. Skin cancer. Lung cancer. What should I know about heart disease, diabetes, and high blood pressure? Blood pressure and heart disease High blood pressure causes heart disease and increases the risk of stroke. This is more likely to develop in people who have high blood pressure readings or are overweight. Talk with your health care provider about your target blood pressure readings. Have your blood pressure checked: Every 3-5 years if you are 38-35 years of age. Every year if you are 78 years old or older. If you are between the ages of 52 and 52 and are a current or former smoker, ask your health care provider if you should have a one-time screening for abdominal aortic aneurysm (AAA). Diabetes Have regular diabetes screenings. This checks your fasting blood sugar level. Have the screening done: Once every three years after age 56 if you are at a normal weight and have a low risk for diabetes. More often and at a younger age if you are overweight or have a high risk for diabetes. What should I know about preventing infection? Hepatitis B If you have a higher risk for hepatitis B, you should be screened for this virus. Talk with your health care provider to find out if you are at risk for  hepatitis B infection. Hepatitis C Blood testing is recommended for: Everyone born from [birth year] through 1963. Anyone with known risk factors for hepatitis C. Sexually transmitted infections (STIs) You should be screened each year for STIs, including gonorrhea and chlamydia, if: You are sexually active and are younger than 46 years of age. You are older than 46 years of age and your health care provider tells you that you are at risk for this type of infection. Your sexual activity has  changed since you were last screened, and you are at increased risk for chlamydia or gonorrhea. Ask your health care provider if you are at risk. Ask your health care provider about whether you are at high risk for HIV. Your health care provider may recommend a prescription medicine to help prevent HIV infection. If you choose to take medicine to prevent HIV, you should first get tested for HIV. You should then be tested every 3 months for as long as you are taking the medicine. Follow these instructions at home: Alcohol use Do not drink alcohol if your health care provider tells you not to drink. If you drink alcohol: Limit how much you have to 0-2 drinks a day. Know how much alcohol is in your drink. In the [place] one drink equals one 12 oz bottle of beer (355 mL), one 5 oz glass of wine (148 mL), or one 1 oz glass of hard liquor (44 mL). Lifestyle Do not use any products that contain nicotine or tobacco. These products include cigarettes, chewing tobacco, and vaping devices, such as e-cigarettes. If you need help quitting, ask your health care provider. Do not use street drugs. Do not share needles. Ask your health care provider for help if you need support or information about quitting drugs. General instructions Schedule regular health, dental, and eye exams. Stay current with your vaccines. Tell your health care provider if: You often feel depressed. You have ever been abused or do not feel safe at home. Summary Adopting a healthy lifestyle and getting preventive care are important in promoting health and wellness. Follow your health care provider's instructions about healthy diet, exercising, and getting tested or screened for diseases. Follow your health care provider's instructions on monitoring your cholesterol and blood pressure. This information is not intended to replace advice given to you by your health care provider. Make sure you discuss any questions you have with your health  care provider. Document Revised: 10/06/2020 Document Reviewed: 10/06/2020 Elsevier Patient Education  2024 [employer]    Maryagnes Small, MD [facility]

## 2023-10-13 ENCOUNTER — Telehealth: Admitting: Physician Assistant

## 2023-10-13 DIAGNOSIS — L247 Irritant contact dermatitis due to plants, except food: Secondary | ICD-10-CM | POA: Diagnosis not present

## 2023-10-13 MED ORDER — PREDNISONE 10 MG PO TABS: ORAL_TABLET | ORAL | 0 refills | Status: DC

## 2023-10-13 MED ORDER — TRIAMCINOLONE ACETONIDE 0.1 % EX CREA: 1.0000 | TOPICAL_CREAM | Freq: Two times a day (BID) | CUTANEOUS | 0 refills | Status: AC

## 2023-10-13 NOTE — Progress Notes (Signed)
 Virtual Visit Consent   Bruce Morales, you are scheduled for a virtual visit with a [facility] provider today. Just as with appointments in the office, your consent must be obtained to participate. Your consent will be active for this visit and any virtual visit you may have with one of our providers in the next 365 days. If you have a MyChart account, a copy of this consent can be sent to you electronically.  As this is a virtual visit, video technology does not allow for your provider to perform a traditional examination. This may limit your provider's ability to fully assess your condition. If your provider identifies any concerns that need to be evaluated in person or the need to arrange testing (such as labs, EKG, etc.), we will make arrangements to do so. Although advances in technology are sophisticated, we cannot ensure that it will always work on either your end or our end. If the connection with a video visit is poor, the visit may have to be switched to a telephone visit. With either a video or telephone visit, we are not always able to ensure that we have a secure connection.  By engaging in this virtual visit, you consent to the provision of healthcare and authorize for your insurance to be billed (if applicable) for the services provided during this visit. Depending on your insurance coverage, you may receive a charge related to this service.  I need to obtain your verbal consent now. Are you willing to proceed with your visit today? [name] has provided verbal consent on 10/13/2023 for a virtual visit (video or telephone). [name], PA-C  Date: 10/13/2023 8:22 AM   Virtual Visit via Video Note   I, [name], connected with  MAKHI FRIEDRICHSEN  ([id number], 01-29-1976) on 10/13/23 at  8:15 AM EDT by a video-enabled telemedicine application and verified that I am speaking with the correct person using two identifiers.  Location: Patient: Virtual  Visit Location Patient: Home Provider: Virtual Visit Location Provider: Home Office   I discussed the limitations of evaluation and management by telemedicine and the availability of in person appointments. The patient expressed understanding and agreed to proceed.    History of Present Illness: Bruce Morales is a 48 y.o. who identifies as a male who was assigned male at birth, and is being seen today for rash.  HPI: Rash This is a new problem. The current episode started in the past 7 days (On Sunday had been working on his mother's house doing yard work, rash started on Monday). The problem has been gradually worsening since onset. The affected locations include the right arm and left arm. The rash is characterized by blistering, redness and itchiness. He was exposed to plant contact. Pertinent negatives include no congestion, cough, facial edema, fatigue or shortness of breath. Past treatments include anti-itch cream and cold compress (calamine lotion).     Problems:  Patient Active Problem List   Diagnosis Date Noted   Cervical spine disease 10/05/2022   Dyslipidemia 04/05/2022   Hepatic steatosis 09/30/2021   History of tobacco use 02/06/2020   Other hyperlipidemia 07/18/2018   Depression 07/18/2018   Prediabetes 05/17/2018   Vitamin D  deficiency 05/17/2018   Hearing loss 09/04/2015   Thrombocytopenia (HCC)    Pancytopenia (HCC) 06/24/2015   Status post lumbar laminectomy 06/10/2015   Class 2 severe obesity with serious comorbidity and body mass index (BMI) of 38.0 to 38.9 in adult [facility]) 11/13/2014  Allergies:  Allergies  Allergen Reactions   Other Hives and Itching   Hydrocodone  Hives and Itching   Oxycodone      Mild Itching, patient can tolerate oxycodone    Percocet [Oxycodone -Acetaminophen ]     Itching, patient says he tolerates   Medications:  Current Outpatient Medications:    ascorbic acid (VITAMIN C) 1000 MG tablet, Take by mouth., Disp: , Rfl:     benzonatate  (TESSALON  PERLES) 100 MG capsule, Take 1 capsule (100 mg total) by mouth 3 (three) times daily as needed., Disp: 20 capsule, Rfl: 0   cyclobenzaprine  (FLEXERIL ) 10 MG tablet, Take 1 tablet (10 mg total) by mouth 3 (three) times daily as needed for muscle spasms., Disp: 30 tablet, Rfl: 3   diltiazem  (CARDIZEM ) 30 MG tablet, TAKE 1 TABLET EVERY DAY AS NEEDED FOR RECTAL SPASM PAINS (Patient taking differently: Take 30 mg by mouth daily. TAKE 1 TABLET EVERY DAY), Disp: 90 tablet, Rfl: 1   GREEN TEA, CAMELLIA SINENSIS, PO, Take by mouth daily in the afternoon., Disp: , Rfl:    HYDROmorphone  (DILAUDID ) 4 MG tablet, Take 4 mg by mouth every 6 (six) hours as needed., Disp: , Rfl:    ipratropium (ATROVENT ) 0.03 % nasal spray, Place 2 sprays into both nostrils every 12 (twelve) hours., Disp: 30 mL, Rfl: 0   predniSONE (DELTASONE) 10 MG tablet, Days 1-4 take 4 tablets (40 mg) daily  Days 5-8 take 3 tablets (30 mg) daily, Days 9-11 take 2 tablets (20 mg) daily, Days 12-14 take 1 tablet (10 mg) daily., Disp: 37 tablet, Rfl: 0   promethazine -dextromethorphan (PROMETHAZINE -DM) 6.25-15 MG/5ML syrup, Take 5 mLs by mouth 3 (three) times daily as needed for cough., Disp: 118 mL, Rfl: 0   rosuvastatin  (CRESTOR ) 10 MG tablet, Take 1 tablet (10 mg total) by mouth daily., Disp: 90 tablet, Rfl: 3   Semaglutide,0.25 or 0.5MG /DOS, (OZEMPIC, 0.25 OR 0.5 MG/DOSE,) 2 MG/1.5ML SOPN, See admin instructions., Disp: , Rfl:   Observations/Objective: Patient is well-developed, well-nourished in no acute distress.  Resting comfortably at home.  Head is normocephalic, atraumatic.  No labored breathing.  Speech is clear and coherent with logical content.  Patient is alert and oriented at baseline.    Assessment and Plan: 1. Irritant contact dermatitis due to plants, except food (Primary) - predniSONE (DELTASONE) 10 MG tablet; Days 1-4 take 4 tablets (40 mg) daily  Days 5-8 take 3 tablets (30 mg) daily, Days 9-11 take 2  tablets (20 mg) daily, Days 12-14 take 1 tablet (10 mg) daily.  Dispense: 37 tablet; Refill: 0  - Plant exposure (poison ivy/poison oak/poison sumac) with rash - Will prescribe Prednisone and Triamcinolone cream - May use topical benadryl  cream and/or calamine lotion for itching - Cool compresses - Luke warm to cool showers - Seek in person evaluation if rash continues to spread or if any appear to become infected   Follow Up Instructions: I discussed the assessment and treatment plan with the patient. The patient was provided an opportunity to ask questions and all were answered. The patient agreed with the plan and demonstrated an understanding of the instructions.  A copy of instructions were sent to the patient via MyChart unless otherwise noted below.    The patient was advised to call back or seek an in-person evaluation if the symptoms worsen or if the condition fails to improve as anticipated.    [name], PA-C

## 2023-10-13 NOTE — Patient Instructions (Signed)
 Bruce Morales, thank you for joining [name], PA-C for today's virtual visit.  While this provider is not your primary care provider (PCP), if your PCP is located in our provider database this encounter information will be shared with them immediately following your visit.   A [facility] MyChart account gives you access to today's visit and all your visits, tests, and labs performed at [facility] " click here if you don't have a [facility] [facility] MyChart account or go to [email or web address]  Consent: (Patient) Bruce Morales provided verbal consent for this virtual visit at the beginning of the encounter.  Current Medications:  Current Outpatient Medications:    triamcinolone cream (KENALOG) 0.1 %, Apply 1 Application topically 2 (two) times daily., Disp: 30 g, Rfl: 0   ascorbic acid (VITAMIN C) 1000 MG tablet, Take by mouth., Disp: , Rfl:    benzonatate  (TESSALON  PERLES) 100 MG capsule, Take 1 capsule (100 mg total) by mouth 3 (three) times daily as needed., Disp: 20 capsule, Rfl: 0   cyclobenzaprine  (FLEXERIL ) 10 MG tablet, Take 1 tablet (10 mg total) by mouth 3 (three) times daily as needed for muscle spasms., Disp: 30 tablet, Rfl: 3   diltiazem  (CARDIZEM ) 30 MG tablet, TAKE 1 TABLET EVERY DAY AS NEEDED FOR RECTAL SPASM PAINS (Patient taking differently: Take 30 mg by mouth daily. TAKE 1 TABLET EVERY DAY), Disp: 90 tablet, Rfl: 1   GREEN TEA, CAMELLIA SINENSIS, PO, Take by mouth daily in the afternoon., Disp: , Rfl:    HYDROmorphone  (DILAUDID ) 4 MG tablet, Take 4 mg by mouth every 6 (six) hours as needed., Disp: , Rfl:    ipratropium (ATROVENT ) 0.03 % nasal spray, Place 2 sprays into both nostrils every 12 (twelve) hours., Disp: 30 mL, Rfl: 0   predniSONE (DELTASONE) 10 MG tablet, Days 1-4 take 4 tablets (40 mg) daily  Days 5-8 take 3 tablets (30 mg) daily, Days 9-11 take 2 tablets (20 mg) daily, Days 12-14 take 1 tablet (10 mg) daily., Disp: 37 tablet, Rfl:  0   promethazine -dextromethorphan (PROMETHAZINE -DM) 6.25-15 MG/5ML syrup, Take 5 mLs by mouth 3 (three) times daily as needed for cough., Disp: 118 mL, Rfl: 0   rosuvastatin  (CRESTOR ) 10 MG tablet, Take 1 tablet (10 mg total) by mouth daily., Disp: 90 tablet, Rfl: 3   Semaglutide,0.25 or 0.5MG /DOS, (OZEMPIC, 0.25 OR 0.5 MG/DOSE,) 2 MG/1.5ML SOPN, See admin instructions., Disp: , Rfl:    Medications ordered in this encounter:  Meds ordered this encounter  Medications   predniSONE (DELTASONE) 10 MG tablet    Sig: Days 1-4 take 4 tablets (40 mg) daily  Days 5-8 take 3 tablets (30 mg) daily, Days 9-11 take 2 tablets (20 mg) daily, Days 12-14 take 1 tablet (10 mg) daily.    Dispense:  37 tablet    Refill:  0    Supervising Provider:   LAMPTEY, PHILIP O [[id number]]   triamcinolone cream (KENALOG) 0.1 %    Sig: Apply 1 Application topically 2 (two) times daily.    Dispense:  30 g    Refill:  0    Supervising Provider:   Corine Dice [[id number]]     *If you need refills on other medications prior to your next appointment, please contact your pharmacy*  Follow-Up: Call back or seek an in-person evaluation if the symptoms worsen or if the condition fails to improve as anticipated.  [facility] Virtual Care [phone]  Other Instructions  Poison [facility] Dermatitis Poison  ivy dermatitis is irritation and swelling (inflammation) of the skin caused by chemicals in the leaves of the poison ivy plant. The skin reaction often involves redness, blisters, and extreme itching. What are the causes? This condition is caused by a chemical (urushiol) found in the sap of the poison ivy plant. This chemical is sticky and can easily spread to people, animals, and objects. You can get poison ivy dermatitis by: Having direct contact with a poison ivy plant. Touching animals, other people, or objects that have come in contact with poison ivy and have the chemical on them. What increases the risk? This  condition is more likely to develop in people who: Are outdoors often in wooded or [place] areas. Go outdoors without wearing protective clothing, such as closed shoes, long pants, and a long-sleeved shirt. What are the signs or symptoms? Symptoms of this condition include: Redness of the skin. Extreme itching. A rash that often includes bumps and blisters. The rash usually appears 48 hours after exposure, if you have been exposed before. If this is the first time you have been exposed, the rash may not appear until a week after exposure. Swelling. This may occur if the reaction is more severe. Symptoms usually last for 1-2 weeks. However, the first time you develop this condition, symptoms may last 3-4 weeks. How is this diagnosed? This condition may be diagnosed based on your symptoms and a physical exam. Your health care provider may also ask you about any recent outdoor activity. How is this treated? Treatment for this condition will vary depending on how severe it is. Treatment may include: Hydrocortisone cream or calamine lotion to relieve itching. Oatmeal baths to soothe the skin. Medicines, such as over-the-counter antihistamine tablets. Oral or injected steroid medicine, for more severe reactions. Follow these instructions at home: Medicines Take or apply over-the-counter and prescription medicines only as told by your health care provider. Use hydrocortisone cream or calamine lotion as needed to soothe the skin and relieve itching. General instructions Do not scratch or rub your skin. Apply a cold, wet cloth (cold compress) to the affected areas or take baths in cool water. This will help with itching. Avoid hot baths and showers. Take oatmeal baths as needed. Use colloidal oatmeal. You can get this at your local pharmacy or grocery store. Follow the instructions on the packaging. Wash all clothes, bedsheets, towels, and blankets you were in contact with between your exposure and  appearance of the rash. Check the affected area every day for signs of infection. Check for: More redness, swelling, or pain. Fluid or blood. Warmth. Pus or a bad smell. Keep all follow-up visits. Your health care provider may want to see how your skin is progressing with treatment. How is this prevented?  Learn to identify the poison ivy plant and avoid contact with the plant. This plant can be recognized by the number of leaves. Generally, poison ivy has three leaves with flowering branches on a single stem. The leaves are typically glossy, and they have jagged edges that come to a point. If you have been exposed to poison ivy, thoroughly wash with soap and water right away. You have about 30 minutes to remove the plant resin before it will cause the rash. Be sure to wash under your fingernails, because any plant resin there will continue to spread the rash. When hiking or camping, wear clothes that will help you to avoid skin exposure. This includes long pants, a long-sleeved shirt, long socks, and hiking boots.  You can also apply preventive lotion to your skin to help limit exposure. If you suspect that your clothes or outdoor gear came in contact with poison ivy, rinse them off outside with a garden hose before you bring them inside your house. When doing yard work or gardening, wear gloves, long sleeves, long pants, and boots. Wash your garden tools and gloves if they come in contact with poison ivy. If you suspect that your pet has come into contact with poison ivy, wash them with pet shampoo and water. Make sure to wear gloves while washing your pet. Contact a health care provider if: You have open sores in the rash area. You have any signs of infection. You have redness that spreads beyond the rash area. You have a fever. You have a rash over a large area of your body. You have a rash on your eyes, mouth, or genitals. You have a rash that does not improve after a few weeks. Get help  right away if: Your face swells or your eyes swell shut. You have trouble breathing. You have trouble swallowing. These symptoms may be an emergency. Get help right away. Call 911. Do not wait to see if the symptoms will go away. Do not drive yourself to the hospital. This information is not intended to replace advice given to you by your health care provider. Make sure you discuss any questions you have with your health care provider. Document Revised: 10/15/2021 Document Reviewed: 10/15/2021 Elsevier Patient Education  2024 Elsevier [employer].   If you have been instructed to have an in-person evaluation today at a local Urgent Care facility, please use the link below. It will take you to a list of all of our available [facility], including address, phone number and hours of operation. Please do not delay care.  [facility]  If you or a family member do not have a primary care provider, use the link below to schedule a visit and establish care. When you choose a [facility] primary care physician or advanced practice provider, you gain a long-term partner in health. Find a Primary Care Provider  Learn more about [facility]'s in-office and virtual care options: [facility] - Get Care Now

## 2024-01-20 ENCOUNTER — Other Ambulatory Visit: Payer: Self-pay | Admitting: Emergency Medicine

## 2024-01-20 DIAGNOSIS — M62838 Other muscle spasm: Secondary | ICD-10-CM

## 2024-03-07 ENCOUNTER — Ambulatory Visit: Payer: Medicare Other

## 2024-03-07 ENCOUNTER — Ambulatory Visit (INDEPENDENT_AMBULATORY_CARE_PROVIDER_SITE_OTHER)

## 2024-03-07 VITALS — Ht 68.0 in | Wt 204.0 lb

## 2024-03-07 DIAGNOSIS — Z0000 Encounter for general adult medical examination without abnormal findings: Secondary | ICD-10-CM | POA: Diagnosis not present

## 2024-03-07 NOTE — Patient Instructions (Signed)
 Bruce Morales,  Thank you for taking the time for your Medicare Wellness Visit. I appreciate your continued commitment to your health goals. Please review the care plan we discussed, and feel free to reach out if I can assist you further.  Medicare recommends these wellness visits once per year to help you and your care team stay ahead of potential health issues. These visits are designed to focus on prevention, allowing your provider to concentrate on managing your acute and chronic conditions during your regular appointments.  Please note that Annual Wellness Visits do not include a physical exam. Some assessments may be limited, especially if the visit was conducted virtually. If needed, we may recommend a separate in-person follow-up with your provider.  Ongoing Care Seeing your primary care provider every 3 to 6 months helps us  monitor your health and provide consistent, personalized care.   Referrals If a referral was made during today's visit and you haven't received any updates within two weeks, please contact the referred provider directly to check on the status.  Recommended Screenings:  Health Maintenance  Topic Date Due   Hepatitis B Vaccine (1 of 3 - 19+ 3-dose series) Never done   HPV Vaccine (1 - 3-dose SCDM series) Never done   Flu Shot  Never done   COVID-19 Vaccine (1 - 2024-25 season) Never done   Medicare Annual Wellness Visit  03/06/2024   Colon Cancer Screening  04/07/2033   Hepatitis C Screening  Completed   HIV Screening  Completed   Pneumococcal Vaccine  Aged Out   Meningitis B Vaccine  Aged Out   DTaP/Tdap/Td vaccine  Discontinued       03/07/2023    8:54 AM  Advanced Directives  Does Patient Have a Medical Advance Directive? No  Would patient like information on creating a medical advance directive? No - Patient declined   Advance Care Planning is important because it: Ensures you receive medical care that aligns with your values, goals, and  preferences. Provides guidance to your family and loved ones, reducing the emotional burden of decision-making during critical moments.  Vision: Annual vision screenings are recommended for early detection of glaucoma, cataracts, and diabetic retinopathy. These exams can also reveal signs of chronic conditions such as diabetes and high blood pressure.  Dental: Annual dental screenings help detect early signs of oral cancer, gum disease, and other conditions linked to overall health, including heart disease and diabetes.  Please see the attached documents for additional preventive care recommendations.

## 2024-03-07 NOTE — Progress Notes (Signed)
 Subjective:   Bruce Morales is a 48 y.o. who presents for a Medicare Wellness preventive visit.  As a reminder, Annual Wellness Visits don't include a physical exam, and some assessments may be limited, especially if this visit is performed virtually. We may recommend an in-person follow-up visit with your provider if needed.  Visit Complete: Virtual I connected with  Bruce Morales on 03/07/24 by a audio enabled telemedicine application and verified that I am speaking with the correct person using two identifiers.  Patient Location: Home  Provider Location: Office/Clinic  I discussed the limitations of evaluation and management by telemedicine. The patient expressed understanding and agreed to proceed.  Vital Signs: Because this visit was a virtual/telehealth visit, some criteria may be missing or patient reported. Any vitals not documented were not able to be obtained and vitals that have been documented are patient reported.  VideoDeclined- This patient declined Librarian, academic. Therefore the visit was completed with audio only.  Persons Participating in Visit: Patient.  AWV Questionnaire: No: Patient Medicare AWV questionnaire was not completed prior to this visit.  Cardiac Risk Factors include: advanced age (>45men, >47 women);dyslipidemia;obesity (BMI >30kg/m2);male gender;sedentary lifestyle     Objective:    Today's Vitals   03/07/24 1546  Weight: 204 lb (92.5 kg)  Height: 5' 8 (1.727 m)   Body mass index is 31.02 kg/m.     03/07/2024    3:58 PM 03/07/2023    8:54 AM 03/04/2022    8:50 AM 08/16/2021    8:36 AM 07/22/2016   10:24 PM 06/24/2015    9:45 AM 06/05/2015    5:02 AM  Advanced Directives  Does Patient Have a Medical Advance Directive? Yes No No No No  No  No   Type of Advance Directive Living will;Healthcare Power of Attorney        Copy of Healthcare Power of Attorney in Chart? No - copy requested        Would  patient like information on creating a medical advance directive?  No - Patient declined No - Patient declined No - Patient declined  No - patient declined information  No - patient declined information      Data saved with a previous flowsheet row definition    Current Medications (verified) Outpatient Encounter Medications as of 03/07/2024  Medication Sig   ascorbic acid (VITAMIN C) 1000 MG tablet Take by mouth.   cyclobenzaprine  (FLEXERIL ) 10 MG tablet Take 1 tablet (10 mg total) by mouth 3 (three) times daily as needed for muscle spasms.   diltiazem  (CARDIZEM ) 30 MG tablet TAKE 1 TABLET EVERY DAY AS NEEDED FOR RECTAL SPASM PAINS (Patient taking differently: Take 30 mg by mouth daily. TAKE 1 TABLET EVERY DAY)   GREEN TEA, CAMELLIA SINENSIS, PO Take by mouth daily in the afternoon.   HYDROmorphone  (DILAUDID ) 4 MG tablet Take 4 mg by mouth every 6 (six) hours as needed.   ipratropium (ATROVENT ) 0.03 % nasal spray Place 2 sprays into both nostrils every 12 (twelve) hours.   rosuvastatin  (CRESTOR ) 10 MG tablet Take 1 tablet (10 mg total) by mouth daily.   Semaglutide,0.25 or 0.5MG /DOS, (OZEMPIC, 0.25 OR 0.5 MG/DOSE,) 2 MG/1.5ML SOPN See admin instructions.   triamcinolone  cream (KENALOG ) 0.1 % Apply 1 Application topically 2 (two) times daily.   benzonatate  (TESSALON  PERLES) 100 MG capsule Take 1 capsule (100 mg total) by mouth 3 (three) times daily as needed. (Patient not taking: Reported on 03/07/2024)  predniSONE  (DELTASONE ) 10 MG tablet Days 1-4 take 4 tablets (40 mg) daily  Days 5-8 take 3 tablets (30 mg) daily, Days 9-11 take 2 tablets (20 mg) daily, Days 12-14 take 1 tablet (10 mg) daily. (Patient not taking: Reported on 03/07/2024)   promethazine -dextromethorphan (PROMETHAZINE -DM) 6.25-15 MG/5ML syrup Take 5 mLs by mouth 3 (three) times daily as needed for cough. (Patient not taking: Reported on 03/07/2024)   No facility-administered encounter medications on file as of 03/07/2024.     Allergies (verified) Other, Hydrocodone , Oxycodone , and Percocet [oxycodone -acetaminophen ]   History: Past Medical History:  Diagnosis Date   Anginal pain 04/15/2014   Arthritis    wrists, knees, lower back (04/16/2014)   Carpal tunnel syndrome    Chronic bronchitis (HCC)    probably get it q yr   Chronic lower back pain    Chronic lower back pain    Constipation    Gall stones    GERD (gastroesophageal reflux disease)    GERD (gastroesophageal reflux disease)    HLD (hyperlipidemia)    Hyperlipidemia    Phreesia 04/14/2020   Joint pain    Meningitis    Plantar fasciitis    Right knee pain    Sciatica    Past Surgical History:  Procedure Laterality Date   CARPAL TUNNEL RELEASE     02/2018 Right arm   CHOLECYSTECTOMY  2011   ESOPHAGOGASTRODUODENOSCOPY N/A 02/27/2013   Procedure: ESOPHAGOGASTRODUODENOSCOPY (EGD);  Surgeon: Lamar LULLA Bunk, MD;  Location: [facility];  Service: Endoscopy;  Laterality: N/A;   KNEE ARTHROSCOPY Right 2013 X 2   KNEE ARTHROSCOPY Right 02/22/2018   LAMINECTOMY AND MICRODISCECTOMY SPINE     LEFT HEART CATHETERIZATION WITH CORONARY ANGIOGRAM N/A 04/17/2014   Procedure: LEFT HEART CATHETERIZATION WITH CORONARY ANGIOGRAM;  Surgeon: Salena GORMAN Negri, MD;  Location: [facility];  Service: Cardiovascular;  Laterality: N/A;   RADIOLOGY WITH ANESTHESIA N/A 06/11/2015   Procedure: MRI LUMBAR WITH AND WITHOUT CONTRAST;  Surgeon: Medication Radiologist, MD;  Location: [facility] OR;  Service: Radiology;  Laterality: N/A;   SPINE SURGERY N/A    Phreesia 04/14/2020   Family History  Problem Relation Age of Onset   Multiple sclerosis Mother    Obesity Mother    Heart failure Maternal Grandfather    Diabetes Father    Hyperlipidemia Father    Sleep apnea Father    Obesity Father    Fibromyalgia Sister    Schizophrenia Brother    Stroke Maternal Grandmother    Diabetes Paternal Grandmother    Social History   Socioeconomic History   Marital  status: Married    Spouse name: Clancy    Number of children: 2   Years of education: Not on file   Highest education level: Not on file  Occupational History   Occupation: Disabled  Tobacco Use   Smoking status: Former    Current packs/day: 0.00    Average packs/day: 0.5 packs/day for 27.0 years (13.5 ttl pk-yrs)    Types: Cigarettes    Start date: 03/10/1988    Quit date: 03/11/2015    Years since quitting: 8.9   Smokeless tobacco: Former  Building services engineer status: Never Used  Substance and Sexual Activity   Alcohol use: Yes   Drug use: No    Comment: 04/16/2014 in my teens   Sexual activity: Yes  Other Topics Concern   Not on file  Social History Narrative   Drinks 2 cups of coffee in the morning.  Social Drivers of Corporate investment banker Strain: Low Risk  (03/07/2024)   Overall Financial Resource Strain (CARDIA)    Difficulty of Paying Living Expenses: Not hard at all  Food Insecurity: No Food Insecurity (03/07/2024)   Hunger Vital Sign    Worried About Running Out of Food in the Last Year: Never true    Ran Out of Food in the Last Year: Never true  Transportation Needs: No Transportation Needs (03/07/2024)   PRAPARE - Administrator, Civil Service (Medical): No    Lack of Transportation (Non-Medical): No  Physical Activity: Inactive (03/07/2024)   Exercise Vital Sign    Days of Exercise per Week: 0 days    Minutes of Exercise per Session: 0 min  Stress: No Stress Concern Present (03/07/2024)   [employer] of Occupational Health - Occupational Stress Questionnaire    Feeling of Stress: Not at all  Social Connections: Socially Integrated (03/07/2024)   Social Connection and Isolation Panel    Frequency of Communication with Friends and Family: More than three times a week    Frequency of Social Gatherings with Friends and Family: More than three times a week    Attends Religious Services: More than 4 times per year    Active Member of [employer]  or Organizations: Yes    Attends Engineer, structural: More than 4 times per year    Marital Status: Married    Tobacco Counseling Counseling given: Not Answered   Clinical Intake:  Pre-visit preparation completed: Yes  Pain : No/denies pain    BMI - recorded: 31.02 Nutritional Status: BMI > 30  Obese Nutritional Risks: None Diabetes: No  Lab Results  Component Value Date   HGBA1C 5.2 10/05/2023   HGBA1C 5.3 10/05/2022   HGBA1C 5.8 09/30/2021     How often do you need to have someone help you when you read instructions, pamphlets, or other written materials from your doctor or pharmacy?: 1 - Never  Interpreter Needed?: No  Comments: wife and daughter live with pt Information entered by :: B.Meloni Hinz,LPN   Activities of Daily Living     03/07/2024    4:01 PM  In your present state of health, do you have any difficulty performing the following activities:  Hearing? 1  Vision? 0  Difficulty concentrating or making decisions? 0  Walking or climbing stairs? 1  Dressing or bathing? 0  Doing errands, shopping? 0  Preparing Food and eating ? N  Using the Toilet? N  In the past six months, have you accidently leaked urine? N  Do you have problems with loss of bowel control? N  Managing your Medications? N  Managing your Finances? N  Housekeeping or managing your Housekeeping? Y    Patient Care Team: Purcell Emil Schanz, MD as PCP - General (Internal Medicine) Kate Lonni CROME, MD as PCP - Cardiology (Cardiology) Debarah Lorrene DEL., MD as Consulting Physician (Ophthalmology)  I have updated your Care Teams any recent Medical Services you may have received from other providers in the past year.     Assessment:   This is a routine wellness examination for Bruce Morales.  Hearing/Vision screen Hearing Screening - Comments:: Patient says he has some hearing difficulties; had hearing test but ok for now Vision Screening - Comments:: Pt says their vision is  good with glasses Dr  Debarah   Goals Addressed             This Visit's Progress  My goal is to get down to 185-190 pounds.   Not on track    continue     Patient Stated       I would like to cut down on my coffee       Depression Screen     03/07/2024    3:55 PM 04/07/2023    8:10 AM 03/07/2023    8:58 AM 10/05/2022    8:01 AM 04/05/2022    8:12 AM 03/04/2022    8:51 AM 03/03/2022    2:46 PM  PHQ 2/9 Scores  PHQ - 2 Score 0 0 0 1 0 0 0  PHQ- 9 Score  0 1        Fall Risk     03/07/2024    3:50 PM 04/07/2023    8:10 AM 03/07/2023    8:57 AM 10/05/2022    8:00 AM 04/05/2022    8:12 AM  Fall Risk   Falls in the past year? 0 0 0 0 0  Number falls in past yr: 0 0 0 0 0  Injury with Fall? 0 0 0 0 0  Risk for fall due to : No Fall Risks No Fall Risks No Fall Risks No Fall Risks No Fall Risks  Follow up Education provided;Falls prevention discussed Falls evaluation completed Falls prevention discussed Falls evaluation completed Falls evaluation completed      Data saved with a previous flowsheet row definition    MEDICARE RISK AT HOME:  Medicare Risk at Home Any stairs in or around the home?: No (one level) If so, are there any without handrails?: Yes Home free of loose throw rugs in walkways, pet beds, electrical cords, etc?: Yes Adequate lighting in your home to reduce risk of falls?: Yes Life alert?: No Use of a cane, walker or w/c?: Yes (cane) Grab bars in the bathroom?: Yes Shower chair or bench in shower?: Yes Elevated toilet seat or a handicapped toilet?: Yes  TIMED UP AND GO:  Was the test performed?  No  Cognitive Function: 6CIT completed        03/07/2024    4:02 PM 03/07/2023    9:00 AM 03/04/2022    9:03 AM  6CIT Screen  What Year? 0 points 0 points 0 points  What month? 0 points 0 points 0 points  What time? 0 points 0 points 0 points  Count back from 20 0 points 0 points 0 points  Months in reverse 0 points 0 points 0 points  Repeat phrase 0  points 0 points 0 points  Total Score 0 points 0 points 0 points    Immunizations Immunization History  Administered Date(s) Administered   Tdap 06/11/2012    Screening Tests Health Maintenance  Topic Date Due   Hepatitis B Vaccines 19-59 Average Risk (1 of 3 - 19+ 3-dose series) Never done   HPV VACCINES (1 - 3-dose SCDM series) Never done   Influenza Vaccine  Never done   COVID-19 Vaccine (1 - 2024-25 season) Never done   Medicare Annual Wellness (AWV)  03/07/2025   Colonoscopy  04/07/2033   Hepatitis C Screening  Completed   HIV Screening  Completed   Pneumococcal Vaccine  Aged Out   Meningococcal B Vaccine  Aged Out   DTaP/Tdap/Td  Discontinued    Health Maintenance Items Addressed: Pt declines Influenza and Covid vaccines  Additional Screening:  Vision Screening: Recommended annual ophthalmology exams for early detection of glaucoma and other disorders of the eye. Is the  patient up to date with their annual eye exam?  Yes  Who is the provider or what is the name of the office in which the patient attends annual eye exams? Dr Milissa  Dental Screening: Recommended annual dental exams for proper oral hygiene  Community Resource Referral / Chronic Care Management: CRR required this visit?  No   CCM required this visit?  No   Plan:    I have personally reviewed and noted the following in the patient's chart:   Medical and social history Use of alcohol, tobacco or illicit drugs  Current medications and supplements including opioid prescriptions. Patient is currently taking opioid prescriptions. Information provided to patient regarding non-opioid alternatives. Patient advised to discuss non-opioid treatment plan with their provider. Functional ability and status Nutritional status Physical activity Advanced directives List of other physicians Hospitalizations, surgeries, and ER visits in previous 12 months Vitals Screenings to include cognitive, depression, and  falls Referrals and appointments  In addition, I have reviewed and discussed with patient certain preventive protocols, quality metrics, and best practice recommendations. A written personalized care plan for preventive services as well as general preventive health recommendations were provided to patient.   Erminio LITTIE Saris, LPN   [id number]   After Visit Summary: (MyChart) Due to this being a telephonic visit, the after visit summary with patients personalized plan was offered to patient via MyChart   Notes: Nothing significant to report at this time.

## 2024-04-03 ENCOUNTER — Ambulatory Visit: Admitting: Emergency Medicine

## 2024-04-03 ENCOUNTER — Ambulatory Visit: Payer: Self-pay | Admitting: Emergency Medicine

## 2024-04-03 ENCOUNTER — Encounter: Payer: Self-pay | Admitting: Emergency Medicine

## 2024-04-03 VITALS — BP 124/84 | HR 68 | Temp 97.9°F | Ht 68.0 in | Wt 200.0 lb

## 2024-04-03 DIAGNOSIS — G959 Disease of spinal cord, unspecified: Secondary | ICD-10-CM | POA: Diagnosis not present

## 2024-04-03 DIAGNOSIS — R7303 Prediabetes: Secondary | ICD-10-CM

## 2024-04-03 DIAGNOSIS — J410 Simple chronic bronchitis: Secondary | ICD-10-CM | POA: Diagnosis not present

## 2024-04-03 DIAGNOSIS — D61818 Other pancytopenia: Secondary | ICD-10-CM

## 2024-04-03 DIAGNOSIS — K760 Fatty (change of) liver, not elsewhere classified: Secondary | ICD-10-CM

## 2024-04-03 DIAGNOSIS — R197 Diarrhea, unspecified: Secondary | ICD-10-CM | POA: Diagnosis not present

## 2024-04-03 DIAGNOSIS — M489 Spondylopathy, unspecified: Secondary | ICD-10-CM

## 2024-04-03 DIAGNOSIS — E785 Hyperlipidemia, unspecified: Secondary | ICD-10-CM

## 2024-04-03 DIAGNOSIS — F3289 Other specified depressive episodes: Secondary | ICD-10-CM

## 2024-04-03 LAB — COMPREHENSIVE METABOLIC PANEL WITH GFR
ALT: 15 U/L (ref 0–53)
AST: 13 U/L (ref 0–37)
Albumin: 4.9 g/dL (ref 3.5–5.2)
Alkaline Phosphatase: 57 U/L (ref 39–117)
BUN: 20 mg/dL (ref 6–23)
CO2: 27 meq/L (ref 19–32)
Calcium: 9.6 mg/dL (ref 8.4–10.5)
Chloride: 104 meq/L (ref 96–112)
Creatinine, Ser: 0.97 mg/dL (ref 0.40–1.50)
GFR: 93.92 mL/min (ref >60.00)
Glucose, Bld: 69 mg/dL — ABNORMAL LOW (ref 70–99)
Potassium: 4.6 meq/L (ref 3.5–5.1)
Sodium: 138 meq/L (ref 135–145)
Total Bilirubin: 0.6 mg/dL (ref 0.2–1.2)
Total Protein: 7.5 g/dL (ref 6.0–8.3)

## 2024-04-03 LAB — LIPID PANEL
Cholesterol: 155 mg/dL (ref 0–200)
HDL: 34.3 mg/dL — ABNORMAL LOW (ref >39.00)
LDL Cholesterol: 87 mg/dL (ref 0–99)
NonHDL: 120.94
Total CHOL/HDL Ratio: 5
Triglycerides: 170 mg/dL — ABNORMAL HIGH (ref 0.0–149.0)
VLDL: 34 mg/dL (ref 0.0–40.0)

## 2024-04-03 LAB — CBC WITH DIFFERENTIAL/PLATELET
Basophils Absolute: 0 K/uL (ref 0.0–0.1)
Basophils Relative: 0.3 % (ref 0.0–3.0)
Eosinophils Absolute: 0.1 K/uL (ref 0.0–0.7)
Eosinophils Relative: 2.3 % (ref 0.0–5.0)
HCT: 43.3 % (ref 39.0–52.0)
Hemoglobin: 14.8 g/dL (ref 13.0–17.0)
Lymphocytes Relative: 27.5 % (ref 12.0–46.0)
Lymphs Abs: 1.8 K/uL (ref 0.7–4.0)
MCHC: 34.2 g/dL (ref 30.0–36.0)
MCV: 86.9 fl (ref 78.0–100.0)
Monocytes Absolute: 0.6 K/uL (ref 0.1–1.0)
Monocytes Relative: 9.3 % (ref 3.0–12.0)
Neutro Abs: 4 K/uL (ref 1.4–7.7)
Neutrophils Relative %: 60.6 % (ref 43.0–77.0)
Platelets: 151 K/uL (ref 150.0–400.0)
RBC: 4.98 Mil/uL (ref 4.22–5.81)
RDW: 12.8 % (ref 11.5–15.5)
WBC: 6.6 K/uL (ref 4.0–10.5)

## 2024-04-03 LAB — HEMOGLOBIN A1C: Hgb A1c MFr Bld: 5.3 % (ref 4.6–6.5)

## 2024-04-03 MED ORDER — CIPROFLOXACIN HCL 500 MG PO TABS: 500.0000 mg | ORAL_TABLET | Freq: Two times a day (BID) | ORAL | 0 refills | Status: AC

## 2024-04-03 NOTE — Assessment & Plan Note (Signed)
Asymptomatic.  No concerns. 

## 2024-04-03 NOTE — Assessment & Plan Note (Signed)
 Clinically stable. Sees neurosurgeon on a regular basis On chronic pain management including opioid Well-controlled.  No concerns.

## 2024-04-03 NOTE — Assessment & Plan Note (Signed)
 Diet and nutrition discussed Cardiovascular risks associated with dyslipidemia discussed Abnormal lipid profile May 2025 Repeat lipid profile today Continues rosuvastatin .  Was advised to increase dose to 20 mg last time The 10-year ASCVD risk score (Arnett DK, et al., 2019) is: 3.8%   Values used to calculate the score:     Age: 48 years     Clincally relevant sex: Male     Is Non-Hispanic African American: No     Diabetic: No     Tobacco smoker: No     Systolic Blood Pressure: 124 mmHg     Is BP treated: No     HDL Cholesterol: 34.4 mg/dL     Total Cholesterol: 214 mg/dL

## 2024-04-03 NOTE — Progress Notes (Signed)
 Bruce Morales 48 y.o.   Chief Complaint  Patient presents with   Follow-up    Pt states that he been having a lot loose stools pt think it may be from ozempic     HISTORY OF PRESENT ILLNESS: This is a 48 y.o. male here for 29-month follow-up of chronic medical conditions Also complaining of 5-day history of watery nonbloody diarrhea not getting better Suspicious of recent takeout food Otherwise doing well.  No other complaints or medical concerns today. Wt Readings from Last 3 Encounters:  03/07/24 204 lb (92.5 kg)  10/05/23 217 lb 8 oz (98.7 kg)  04/07/23 218 lb (98.9 kg)     HPI   Prior to Admission medications   Medication Sig Start Date End Date Taking? Authorizing Provider  ascorbic acid (VITAMIN C) 1000 MG tablet Take by mouth.    [provider]  cyclobenzaprine  (FLEXERIL ) 10 MG tablet Take 1 tablet (10 mg total) by mouth 3 (three) times daily as needed for muscle spasms. 01/20/24   Purcell Emil Schanz, MD  diltiazem  (CARDIZEM ) 30 MG tablet TAKE 1 TABLET EVERY DAY AS NEEDED FOR RECTAL SPASM PAINS Patient taking differently: Take 30 mg by mouth daily. TAKE 1 TABLET EVERY DAY 07/09/20   Jadon Harbaugh, Emil Schanz, MD  GREEN TEA, CAMELLIA SINENSIS, PO Take by mouth daily in the afternoon.    [provider]  HYDROmorphone  (DILAUDID ) 4 MG tablet Take 4 mg by mouth every 6 (six) hours as needed. 07/28/21   [provider]  ipratropium (ATROVENT ) 0.03 % nasal spray Place 2 sprays into both nostrils every 12 (twelve) hours. 12/19/22   Vivienne Delon HERO, PA-C  rosuvastatin  (CRESTOR ) 10 MG tablet Take 1 tablet (10 mg total) by mouth daily. 08/27/22   Kate Lonni LITTIE, MD  Semaglutide,0.25 or 0.5MG /DOS, (OZEMPIC, 0.25 OR 0.5 MG/DOSE,) 2 MG/1.5ML SOPN See admin instructions. 09/14/21   [provider]  triamcinolone  cream (KENALOG ) 0.1 % Apply 1 Application topically 2 (two) times daily. 10/13/23   Vivienne Delon HERO, PA-C    Allergies   Allergen Reactions   Other Hives and Itching   Hydrocodone  Hives and Itching   Oxycodone      Mild Itching, patient can tolerate oxycodone    Percocet [Oxycodone -Acetaminophen ]     Itching, patient says he tolerates    Patient Active Problem List   Diagnosis Date Noted   Myelopathy (HCC) 04/03/2024   Simple chronic bronchitis (HCC) 04/03/2024   Cervical spine disease 10/05/2022   Dyslipidemia 04/05/2022   Hepatic steatosis 09/30/2021   History of tobacco use 02/06/2020   Other hyperlipidemia 07/18/2018   Depression 07/18/2018   Prediabetes 05/17/2018   Vitamin D  deficiency 05/17/2018   Hearing loss 09/04/2015   Thrombocytopenia    Pancytopenia (HCC) 06/24/2015   Status post lumbar laminectomy 06/10/2015   Class 2 severe obesity with serious comorbidity and body mass index (BMI) of 38.0 to 38.9 in adult 11/13/2014    Past Medical History:  Diagnosis Date   Anginal pain 04/15/2014   Arthritis    wrists, knees, lower back (04/16/2014)   Carpal tunnel syndrome    Chronic bronchitis (HCC)    probably get it q yr   Chronic lower back pain    Chronic lower back pain    Constipation    Gall stones    GERD (gastroesophageal reflux disease)    GERD (gastroesophageal reflux disease)    HLD (hyperlipidemia)    Hyperlipidemia    Phreesia 04/14/2020   Joint pain  Meningitis    Plantar fasciitis    Right knee pain    Sciatica     Past Surgical History:  Procedure Laterality Date   CARPAL TUNNEL RELEASE     02/2018 Right arm   CHOLECYSTECTOMY  2011   ESOPHAGOGASTRODUODENOSCOPY N/A 02/27/2013   Procedure: ESOPHAGOGASTRODUODENOSCOPY (EGD);  Surgeon: Lamar LULLA Bunk, MD;  Location: [facility];  Service: Endoscopy;  Laterality: N/A;   KNEE ARTHROSCOPY Right 2013 X 2   KNEE ARTHROSCOPY Right 02/22/2018   LAMINECTOMY AND MICRODISCECTOMY SPINE     LEFT HEART CATHETERIZATION WITH CORONARY ANGIOGRAM N/A 04/17/2014   Procedure: LEFT HEART CATHETERIZATION WITH CORONARY  ANGIOGRAM;  Surgeon: Salena GORMAN Negri, MD;  Location: [facility];  Service: Cardiovascular;  Laterality: N/A;   RADIOLOGY WITH ANESTHESIA N/A 06/11/2015   Procedure: MRI LUMBAR WITH AND WITHOUT CONTRAST;  Surgeon: Medication Radiologist, MD;  Location: [facility] OR;  Service: Radiology;  Laterality: N/A;   SPINE SURGERY N/A    Phreesia 04/14/2020    Social History   Socioeconomic History   Marital status: Married    Spouse name: Clancy    Number of children: 2   Years of education: Not on file   Highest education level: Not on file  Occupational History   Occupation: Disabled  Tobacco Use   Smoking status: Former    Current packs/day: 0.00    Average packs/day: 0.5 packs/day for 27.0 years (13.5 ttl pk-yrs)    Types: Cigarettes    Start date: 03/10/1988    Quit date: 03/11/2015    Years since quitting: 9.0   Smokeless tobacco: Former  Building Services Engineer status: Never Used  Substance and Sexual Activity   Alcohol use: Yes   Drug use: No    Comment: 04/16/2014 in my teens   Sexual activity: Yes  Other Topics Concern   Not on file  Social History Narrative   Drinks 2 cups of coffee in the morning.   Social Drivers of Corporate Investment Banker Strain: Low Risk  (03/07/2024)   Overall Financial Resource Strain (CARDIA)    Difficulty of Paying Living Expenses: Not hard at all  Food Insecurity: No Food Insecurity (03/07/2024)   Hunger Vital Sign    Worried About Running Out of Food in the Last Year: Never true    Ran Out of Food in the Last Year: Never true  Transportation Needs: No Transportation Needs (03/07/2024)   PRAPARE - Administrator, Civil Service (Medical): No    Lack of Transportation (Non-Medical): No  Physical Activity: Inactive (03/07/2024)   Exercise Vital Sign    Days of Exercise per Week: 0 days    Minutes of Exercise per Session: 0 min  Stress: No Stress Concern Present (03/07/2024)   [employer] of Occupational Health - Occupational Stress  Questionnaire    Feeling of Stress: Not at all  Social Connections: Socially Integrated (03/07/2024)   Social Connection and Isolation Panel    Frequency of Communication with Friends and Family: More than three times a week    Frequency of Social Gatherings with Friends and Family: More than three times a week    Attends Religious Services: More than 4 times per year    Active Member of [employer] or Organizations: Yes    Attends Banker Meetings: More than 4 times per year    Marital Status: Married  Catering Manager Violence: Not At Risk (03/07/2024)   Humiliation, Afraid, Rape, and Kick questionnaire  Fear of Current or Ex-Partner: No    Emotionally Abused: No    Physically Abused: No    Sexually Abused: No    Family History  Problem Relation Age of Onset   Multiple sclerosis Mother    Obesity Mother    Heart failure Maternal Grandfather    Diabetes Father    Hyperlipidemia Father    Sleep apnea Father    Obesity Father    Fibromyalgia Sister    Schizophrenia Brother    Stroke Maternal Grandmother    Diabetes Paternal Grandmother      Review of Systems  Constitutional: Negative.  Negative for chills and fever.  HENT: Negative.  Negative for congestion and sore throat.   Respiratory: Negative.  Negative for cough and shortness of breath.   Cardiovascular: Negative.  Negative for chest pain and palpitations.  Gastrointestinal:  Positive for diarrhea.  Genitourinary: Negative.  Negative for dysuria and urgency.  Musculoskeletal:        Tennis elbow right side  Skin: Negative.  Negative for rash.  Neurological:  Negative for dizziness and headaches.  All other systems reviewed and are negative.   Today's Vitals   04/03/24 0803  BP: 124/84  Pulse: 68  Temp: 97.9 F (36.6 C)  TempSrc: Oral  SpO2: 98%  Weight: 200 lb (90.7 kg)  Height: 5' 8 (1.727 m)   Body mass index is 30.41 kg/m.   Physical Exam Vitals reviewed.  Constitutional:       Appearance: Normal appearance.  HENT:     Head: Normocephalic.  Eyes:     Extraocular Movements: Extraocular movements intact.  Cardiovascular:     Rate and Rhythm: Normal rate.  Pulmonary:     Effort: Pulmonary effort is normal.  Skin:    General: Skin is warm and dry.  Neurological:     Mental Status: He is alert and oriented to person, place, and time.  Psychiatric:        Mood and Affect: Mood normal.        Behavior: Behavior normal.      ASSESSMENT & PLAN: A total of 44 minutes was spent with the patient and counseling/coordination of care regarding preparing for this visit, review of most recent office visit notes, review of multiple chronic medical conditions and their management, review of all medications, review of most recent bloodwork results, review of health maintenance items, education on nutrition, prognosis, documentation, and need for follow up.   Problem List Items Addressed This Visit       Respiratory   Simple chronic bronchitis (HCC)   Asymptomatic.  No concerns.        Digestive   Hepatic steatosis   Normal liver enzymes last May. Chronic stable condition.  No concerns. Diet and nutrition discussed.      Relevant Orders   Comprehensive metabolic panel with GFR   Lipid panel   Diarrhea of presumed infectious origin   Of recent onset and still persistent Nonbloody watery diarrhea most likely bacterial Recommend stool testing Start Cipro  500 mg twice a day for 3 days Diet and nutrition discussed advised to stay well-hydrated. Contact the office if no better or worse during the next several days.      Relevant Medications   ciprofloxacin  (CIPRO ) 500 MG tablet   Other Relevant Orders   CBC with Differential/Platelet   Comprehensive metabolic panel with GFR     Nervous and Auditory   Myelopathy (HCC)   Stable.  No concerns.  Relevant Orders   Comprehensive metabolic panel with GFR     Hematopoietic and Hemostatic   Pancytopenia  (HCC)   Chronic.  Stable.  No concerns. CBC done today      Relevant Orders   CBC with Differential/Platelet     Other   Prediabetes   Hemoglobin A1c of 5.3. Stable chronic condition Cardiovascular risks associated with diabetes discussed Diet and nutrition discussed Benefits of exercise discussed      Relevant Orders   Hemoglobin A1c   Depression   In full remission.  No concerns      Dyslipidemia - Primary   Diet and nutrition discussed Cardiovascular risks associated with dyslipidemia discussed Abnormal lipid profile May 2025 Repeat lipid profile today Continues rosuvastatin .  Was advised to increase dose to 20 mg last time The 10-year ASCVD risk score (Arnett DK, et al., 2019) is: 3.8%   Values used to calculate the score:     Age: 25 years     Clincally relevant sex: Male     Is Non-Hispanic African American: No     Diabetic: No     Tobacco smoker: No     Systolic Blood Pressure: 124 mmHg     Is BP treated: No     HDL Cholesterol: 34.4 mg/dL     Total Cholesterol: 214 mg/dL       Relevant Orders   GI Profile, Stool, PCR   Cervical spine disease   Clinically stable. Sees neurosurgeon on a regular basis On chronic pain management including opioid Well-controlled.  No concerns.      Patient Instructions  Health Maintenance, Male Adopting a healthy lifestyle and getting preventive care are important in promoting health and wellness. Ask your health care provider about: The right schedule for you to have regular tests and exams. Things you can do on your own to prevent diseases and keep yourself healthy. What should I know about diet, weight, and exercise? Eat a healthy diet  Eat a diet that includes plenty of vegetables, fruits, low-fat dairy products, and lean protein. Do not eat a lot of foods that are high in solid fats, added sugars, or sodium. Maintain a healthy weight Body mass index (BMI) is a measurement that can be used to identify possible  weight problems. It estimates body fat based on height and weight. Your health care provider can help determine your BMI and help you achieve or maintain a healthy weight. Get regular exercise Get regular exercise. This is one of the most important things you can do for your health. Most adults should: Exercise for at least 150 minutes each week. The exercise should increase your heart rate and make you sweat (moderate-intensity exercise). Do strengthening exercises at least twice a week. This is in addition to the moderate-intensity exercise. Spend less time sitting. Even light physical activity can be beneficial. Watch cholesterol and blood lipids Have your blood tested for lipids and cholesterol at 46 years of age, then have this test every 5 years. You may need to have your cholesterol levels checked more often if: Your lipid or cholesterol levels are high. You are older than 46 years of age. You are at high risk for heart disease. What should I know about cancer screening? Many types of cancers can be detected early and may often be prevented. Depending on your health history and family history, you may need to have cancer screening at various ages. This may include screening for: Colorectal cancer. Prostate cancer. Skin cancer. Lung  cancer. What should I know about heart disease, diabetes, and high blood pressure? Blood pressure and heart disease High blood pressure causes heart disease and increases the risk of stroke. This is more likely to develop in people who have high blood pressure readings or are overweight. Talk with your health care provider about your target blood pressure readings. Have your blood pressure checked: Every 3-5 years if you are 60-67 years of age. Every year if you are 39 years old or older. If you are between the ages of 63 and 47 and are a current or former smoker, ask your health care provider if you should have a one-time screening for abdominal aortic  aneurysm (AAA). Diabetes Have regular diabetes screenings. This checks your fasting blood sugar level. Have the screening done: Once every three years after age 107 if you are at a normal weight and have a low risk for diabetes. More often and at a younger age if you are overweight or have a high risk for diabetes. What should I know about preventing infection? Hepatitis B If you have a higher risk for hepatitis B, you should be screened for this virus. Talk with your health care provider to find out if you are at risk for hepatitis B infection. Hepatitis C Blood testing is recommended for: Everyone born from [birth year] through 1965. Anyone with known risk factors for hepatitis C. Sexually transmitted infections (STIs) You should be screened each year for STIs, including gonorrhea and chlamydia, if: You are sexually active and are younger than 46 years of age. You are older than 46 years of age and your health care provider tells you that you are at risk for this type of infection. Your sexual activity has changed since you were last screened, and you are at increased risk for chlamydia or gonorrhea. Ask your health care provider if you are at risk. Ask your health care provider about whether you are at high risk for HIV. Your health care provider may recommend a prescription medicine to help prevent HIV infection. If you choose to take medicine to prevent HIV, you should first get tested for HIV. You should then be tested every 3 months for as long as you are taking the medicine. Follow these instructions at home: Alcohol use Do not drink alcohol if your health care provider tells you not to drink. If you drink alcohol: Limit how much you have to 0-2 drinks a day. Know how much alcohol is in your drink. In the [place] one drink equals one 12 oz bottle of beer (355 mL), one 5 oz glass of wine (148 mL), or one 1 oz glass of hard liquor (44 mL). Lifestyle Do not use any products that contain nicotine  or tobacco. These products include cigarettes, chewing tobacco, and vaping devices, such as e-cigarettes. If you need help quitting, ask your health care provider. Do not use street drugs. Do not share needles. Ask your health care provider for help if you need support or information about quitting drugs. General instructions Schedule regular health, dental, and eye exams. Stay current with your vaccines. Tell your health care provider if: You often feel depressed. You have ever been abused or do not feel safe at home. Summary Adopting a healthy lifestyle and getting preventive care are important in promoting health and wellness. Follow your health care provider's instructions about healthy diet, exercising, and getting tested or screened for diseases. Follow your health care provider's instructions on monitoring your cholesterol and blood pressure. This  information is not intended to replace advice given to you by your health care provider. Make sure you discuss any questions you have with your health care provider. Document Revised: 10/06/2020 Document Reviewed: 10/06/2020 Elsevier Patient Education  2024 Elsevier Inc.     Emil Schaumann, MD [facility]

## 2024-04-03 NOTE — Assessment & Plan Note (Signed)
 Hemoglobin A1c of 5.3. Stable chronic condition Cardiovascular risks associated with diabetes discussed Diet and nutrition discussed Benefits of exercise discussed

## 2024-04-03 NOTE — Patient Instructions (Signed)
 Health Maintenance, Male  Adopting a healthy lifestyle and getting preventive care are important in promoting health and wellness. Ask your health care provider about:  The right schedule for you to have regular tests and exams.  Things you can do on your own to prevent diseases and keep yourself healthy.  What should I know about diet, weight, and exercise?  Eat a healthy diet    Eat a diet that includes plenty of vegetables, fruits, low-fat dairy products, and lean protein.  Do not eat a lot of foods that are high in solid fats, added sugars, or sodium.  Maintain a healthy weight  Body mass index (BMI) is a measurement that can be used to identify possible weight problems. It estimates body fat based on height and weight. Your health care provider can help determine your BMI and help you achieve or maintain a healthy weight.  Get regular exercise  Get regular exercise. This is one of the most important things you can do for your health. Most adults should:  Exercise for at least 150 minutes each week. The exercise should increase your heart rate and make you sweat (moderate-intensity exercise).  Do strengthening exercises at least twice a week. This is in addition to the moderate-intensity exercise.  Spend less time sitting. Even light physical activity can be beneficial.  Watch cholesterol and blood lipids  Have your blood tested for lipids and cholesterol at 46 years of age, then have this test every 5 years.  You may need to have your cholesterol levels checked more often if:  Your lipid or cholesterol levels are high.  You are older than 46 years of age.  You are at high risk for heart disease.  What should I know about cancer screening?  Many types of cancers can be detected early and may often be prevented. Depending on your health history and family history, you may need to have cancer screening at various ages. This may include screening for:  Colorectal cancer.  Prostate cancer.  Skin cancer.  Lung  cancer.  What should I know about heart disease, diabetes, and high blood pressure?  Blood pressure and heart disease  High blood pressure causes heart disease and increases the risk of stroke. This is more likely to develop in people who have high blood pressure readings or are overweight.  Talk with your health care provider about your target blood pressure readings.  Have your blood pressure checked:  Every 3-5 years if you are 46-46 years of age.  Every year if you are 3 years old or older.  If you are between the ages of 60 and 72 and are a current or former smoker, ask your health care provider if you should have a one-time screening for abdominal aortic aneurysm (AAA).  Diabetes  Have regular diabetes screenings. This checks your fasting blood sugar level. Have the screening done:  Once every three years after age 46 if you are at a normal weight and have a low risk for diabetes.  More often and at a younger age if you are overweight or have a high risk for diabetes.  What should I know about preventing infection?  Hepatitis B  If you have a higher risk for hepatitis B, you should be screened for this virus. Talk with your health care provider to find out if you are at risk for hepatitis B infection.  Hepatitis C  Blood testing is recommended for:  Everyone born from [birth year] through 1963.  Anyone  with known risk factors for hepatitis C.  Sexually transmitted infections (STIs)  You should be screened each year for STIs, including gonorrhea and chlamydia, if:  You are sexually active and are younger than 46 years of age.  You are older than 46 years of age and your health care provider tells you that you are at risk for this type of infection.  Your sexual activity has changed since you were last screened, and you are at increased risk for chlamydia or gonorrhea. Ask your health care provider if you are at risk.  Ask your health care provider about whether you are at high risk for HIV. Your health care provider  may recommend a prescription medicine to help prevent HIV infection. If you choose to take medicine to prevent HIV, you should first get tested for HIV. You should then be tested every 3 months for as long as you are taking the medicine.  Follow these instructions at home:  Alcohol use  Do not drink alcohol if your health care provider tells you not to drink.  If you drink alcohol:  Limit how much you have to 0-2 drinks a day.  Know how much alcohol is in your drink. In the [place] one drink equals one 12 oz bottle of beer (355 mL), one 5 oz glass of wine (148 mL), or one 1 oz glass of hard liquor (44 mL).  Lifestyle  Do not use any products that contain nicotine or tobacco. These products include cigarettes, chewing tobacco, and vaping devices, such as e-cigarettes. If you need help quitting, ask your health care provider.  Do not use street drugs.  Do not share needles.  Ask your health care provider for help if you need support or information about quitting drugs.  General instructions  Schedule regular health, dental, and eye exams.  Stay current with your vaccines.  Tell your health care provider if:  You often feel depressed.  You have ever been abused or do not feel safe at home.  Summary  Adopting a healthy lifestyle and getting preventive care are important in promoting health and wellness.  Follow your health care provider's instructions about healthy diet, exercising, and getting tested or screened for diseases.  Follow your health care provider's instructions on monitoring your cholesterol and blood pressure.  This information is not intended to replace advice given to you by your health care provider. Make sure you discuss any questions you have with your health care provider.  Document Revised: 10/06/2020 Document Reviewed: 10/06/2020  Elsevier Patient Education  2024 [employer].

## 2024-04-03 NOTE — Assessment & Plan Note (Signed)
 In full remission.  No concerns

## 2024-04-03 NOTE — Assessment & Plan Note (Signed)
 Normal liver enzymes last May. Chronic stable condition.  No concerns. Diet and nutrition discussed.

## 2024-04-03 NOTE — Assessment & Plan Note (Signed)
Chronic.  Stable.  No concerns. CBC done today

## 2024-04-03 NOTE — Progress Notes (Signed)
 1

## 2024-04-03 NOTE — Assessment & Plan Note (Signed)
 Of recent onset and still persistent Nonbloody watery diarrhea most likely bacterial Recommend stool testing Start Cipro  500 mg twice a day for 3 days Diet and nutrition discussed advised to stay well-hydrated. Contact the office if no better or worse during the next several days.

## 2024-04-03 NOTE — Assessment & Plan Note (Signed)
 Stable.  No concerns.

## 2024-04-04 ENCOUNTER — Telehealth: Payer: Self-pay

## 2024-04-04 NOTE — Telephone Encounter (Signed)
 I have sent patient a my chart message in regards to this. Unfortunately we will not be able to help with this provider has already advise about this

## 2024-04-04 NOTE — Telephone Encounter (Signed)
 Not sure what he is asking for.  PA?  Insurance issues, I do not have any control over that.  Dropping tiers is not something I can help with, beyond my control.  Recommend he calls his insurance and deals with this issue directly.

## 2024-04-04 NOTE — Telephone Encounter (Signed)
 Copied from CRM [id number]. Topic: Clinical - Prescription Issue >> Apr 04, 2024  9:32 AM Macario HERO wrote: Reason for CRM: Patient is requesting a call back from Dr. Lebron nurse regarding medication and insurance changes.

## 2024-04-05 LAB — GI PROFILE, STOOL, PCR
Adenovirus F 40/41: NOT DETECTED
Astrovirus: NOT DETECTED
C difficile toxin A/B: NOT DETECTED
Campylobacter: NOT DETECTED
Cryptosporidium: NOT DETECTED
Cyclospora cayetanensis: NOT DETECTED
Entamoeba histolytica: NOT DETECTED
Enteroaggregative E coli: NOT DETECTED
Enteropathogenic E coli: NOT DETECTED
Enterotoxigenic E coli: DETECTED — AB
Giardia lamblia: NOT DETECTED
Norovirus GI/GII: NOT DETECTED
Plesiomonas shigelloides: NOT DETECTED
Rotavirus A: NOT DETECTED
Salmonella: NOT DETECTED
Sapovirus: NOT DETECTED
Shiga-toxin-producing E coli: NOT DETECTED
Shigella/Enteroinvasive E coli: NOT DETECTED
Vibrio cholerae: NOT DETECTED
Vibrio: NOT DETECTED
Yersinia enterocolitica: NOT DETECTED

## 2024-04-16 ENCOUNTER — Telehealth: Payer: Self-pay

## 2024-04-16 DIAGNOSIS — R7303 Prediabetes: Secondary | ICD-10-CM

## 2024-04-16 NOTE — Telephone Encounter (Signed)
 Copied from CRM #[id number]. Topic: Clinical - Medical Advice >> Apr 16, 2024 11:16 AM Laymon HERO wrote: Reason for CRM: Patient calling inquiring about lowering tier for ozempic- to  lower in price for medication- wanting to have someone reach out to insurance. speak to them about conditions that he needs the medication and that it has been working to reverse his health issues. 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

## 2024-04-17 NOTE — Telephone Encounter (Signed)
 Is it ok to put a referral in to help patient with this?

## 2024-04-18 NOTE — Addendum Note (Signed)
 Addended by: ROSALVA LEX RAMAN on: 04/18/2024 08:47 AM   Modules accepted: Orders

## 2024-04-18 NOTE — Telephone Encounter (Signed)
 I have put a referral in to patient to speak with out pharmacist to see about options for

## 2024-04-20 ENCOUNTER — Telehealth: Payer: Self-pay | Admitting: *Deleted

## 2024-04-20 NOTE — Progress Notes (Unsigned)
 Care Guide Pharmacy Note  04/20/2024 Name: Bruce Morales MRN: [id number] DOB: 18-Jul-1975  Referred By: Purcell Emil Schanz, MD Reason for referral: Call Attempt #1 and Complex Care Management (Outreach to schedule referral with pharmacist )   Bruce Morales is a 48 y.o. year old male who is a primary care patient of Sagardia, Emil Schanz, MD.  Bruce Morales was referred to the pharmacist for assistance related to: med assistance   An unsuccessful telephone outreach was attempted today to contact the patient who was referred to the pharmacy team for assistance with medication assistance. Additional attempts will be made to contact the patient.  Thedford Franks, CMA [place]  [facility], [facility] Guide Direct Dial: [phone]  Fax: [phone] Website: [email or web address]

## 2024-04-23 NOTE — Progress Notes (Unsigned)
 Care Guide Pharmacy Note  04/23/2024 Name: Bruce Morales MRN: [id number] DOB: January 05, 1976  Referred By: Purcell Emil Schanz, MD Reason for referral: Call Attempt #1 and Complex Care Management (Outreach to schedule referral with pharmacist )   [name] is a 48 y.o. year old male who is a primary care patient of Sagardia, Emil Schanz, MD.  [name] was referred to the pharmacist for assistance related to: med assistance   A second unsuccessful telephone outreach was attempted today to contact the patient who was referred to the pharmacy team for assistance with medication assistance. Additional attempts will be made to contact the patient.  Thedford Franks, CMA [place]  [facility], [facility] Guide Direct Dial: [phone]  Fax: [phone] Website: [email or web address]

## 2024-04-24 NOTE — Progress Notes (Signed)
 Care Guide Pharmacy Note  04/24/2024 Name: Bruce Morales MRN: [id number] DOB: 1975-08-24  Referred By: Purcell Emil Schanz, MD Reason for referral: Call Attempt #1 and Complex Care Management (Outreach to schedule referral with pharmacist )   Bruce Morales is a 48 y.o. year old male who is a primary care patient of Sagardia, Miguel Jose, MD.  Bruce Morales was referred to the pharmacist for assistance related to: ozempic med assistance   Successful contact was made with the patient to discuss pharmacy services including being ready for the pharmacist to call at least 5 minutes before the scheduled appointment time and to have medication bottles and any blood pressure readings ready for review. The patient agreed to meet with the pharmacist via telephone visit on 05/04/2024  Thedford Franks, CMA [place]  [facility], [facility] Guide Direct Dial: [phone]  Fax: [phone] Website: [email or web address]

## 2024-05-04 ENCOUNTER — Telehealth: Payer: Self-pay

## 2024-05-04 ENCOUNTER — Other Ambulatory Visit

## 2024-05-04 DIAGNOSIS — Z6838 Body mass index (BMI) 38.0-38.9, adult: Secondary | ICD-10-CM

## 2024-05-04 NOTE — Telephone Encounter (Signed)
 Copied from CRM #[id number]. Topic: General - Other >> May 04, 2024  9:27 AM Treva T wrote: Reason for CRM: Pt called states he missed call fro a telephone appt today. Attempting to contact caller back.  Called and spoke to office, states with another pt at the time of call.   Advised to send clinical CRM for a return call to pt.   Ph. [phone], pt is aware of return call back.

## 2024-05-04 NOTE — Progress Notes (Signed)
   05/04/2024 Name: Bruce Morales MRN: [id number] DOB: 1975-07-04  Chief Complaint  Patient presents with   Medication Assistance    Bruce Morales is a 48 y.o. year old male who presented for a telephone visit.   They were referred to the pharmacist by their PCP for assistance in managing medication access.    Subjective:  Care Team: Primary Care Provider: Purcell Emil Schanz, MD    Medication Access/Adherence  Current Pharmacy:  [facility] [place], [place] - [address] [place] [address] Phone: [phone] Fax: [phone]  [facility] [phone] - [place], [place] - [address] [place] [address] Phone: [phone] Fax: [phone]   Patient reports affordability concerns with their medications: Yes  Patient reports access/transportation concerns to their pharmacy: No  Patient reports adherence concerns with their medications:  No    Pt reached out to PCP regarding getting Ozempic through insurance. [employer] told pt that provider can request a Tier exception to make medication more affordable. Pt was getting Ozempic through patient assistance program since 2023 but will not longer be able to get it through them next year. Pt was getting Ozempic prescribed through [facility] weight loss clinic.  Objective:  Lab Results  Component Value Date   HGBA1C 5.3 04/03/2024    Lab Results  Component Value Date   CREATININE 0.97 04/03/2024   BUN 20 04/03/2024   NA 138 04/03/2024   K 4.6 04/03/2024   CL 104 04/03/2024   CO2 27 04/03/2024    Lab Results  Component Value Date   CHOL 155 04/03/2024   HDL 34.30 (L) 04/03/2024   LDLCALC 87 04/03/2024   LDLDIRECT 140.0 10/05/2022   TRIG 170.0 (H) 04/03/2024   CHOLHDL 5 04/03/2024    Medications Reviewed Today   Medications were not reviewed in this encounter        Assessment/Plan:   As pt does not have diagnosis for Type 2 Diabetes, insurance will not cover Ozempic or Mounjaro. Additionally, at this time, Medicare does not cover weight loss medications such as Wegovy or Zepbound except possibly in some cases for sleep apnea, which pt does not have as a diagnosis in his chart currently. Reviewed direct pay program option through [employer] [facility] however the cost is high at $199 per month for first 2 months, then $349 per month thereafter.  Follow Up Plan: PRN  Darrelyn Drum, PharmD, BCPS, CPP Clinical Pharmacist Practitioner  Primary Care at [facility] [phone]

## 2024-05-14 ENCOUNTER — Telehealth: Admitting: Physician Assistant

## 2024-05-14 DIAGNOSIS — J069 Acute upper respiratory infection, unspecified: Secondary | ICD-10-CM | POA: Diagnosis not present

## 2024-05-14 MED ORDER — PREDNISONE 20 MG PO TABS: 40.0000 mg | ORAL_TABLET | Freq: Every day | ORAL | 0 refills | Status: AC

## 2024-05-14 MED ORDER — PSEUDOEPH-BROMPHEN-DM 30-2-10 MG/5ML PO SYRP: 5.0000 mL | ORAL_SOLUTION | Freq: Four times a day (QID) | ORAL | 0 refills | Status: AC | PRN

## 2024-05-14 NOTE — Patient Instructions (Signed)
 Bruce Morales, thank you for joining [name], PA-C for today's virtual visit.  While this provider is not your primary care provider (PCP), if your PCP is located in our provider database this encounter information will be shared with them immediately following your visit.   A [facility] MyChart account gives you access to today's visit and all your visits, tests, and labs performed at [facility]  click here if you don't have a [facility] MyChart account or go to [email or web address]  Consent: (Patient) Bruce Morales provided verbal consent for this virtual visit at the beginning of the encounter.  Current Medications:  Current Outpatient Medications:    brompheniramine-pseudoephedrine-DM 30-2-10 MG/5ML syrup, Take 5 mLs by mouth 4 (four) times daily as needed., Disp: 120 mL, Rfl: 0   predniSONE  (DELTASONE ) 20 MG tablet, Take 2 tablets (40 mg total) by mouth daily with breakfast., Disp: 10 tablet, Rfl: 0   ascorbic acid (VITAMIN C) 1000 MG tablet, Take by mouth., Disp: , Rfl:    cyclobenzaprine  (FLEXERIL ) 10 MG tablet, Take 1 tablet (10 mg total) by mouth 3 (three) times daily as needed for muscle spasms., Disp: 30 tablet, Rfl: 3   diltiazem  (CARDIZEM ) 30 MG tablet, TAKE 1 TABLET EVERY DAY AS NEEDED FOR RECTAL SPASM PAINS (Patient taking differently: Take 30 mg by mouth daily. TAKE 1 TABLET EVERY DAY), Disp: 90 tablet, Rfl: 1   GREEN TEA, CAMELLIA SINENSIS, PO, Take by mouth daily in the afternoon., Disp: , Rfl:    HYDROmorphone  (DILAUDID ) 4 MG tablet, Take 4 mg by mouth every 6 (six) hours as needed., Disp: , Rfl:    ipratropium (ATROVENT ) 0.03 % nasal spray, Place 2 sprays into both nostrils every 12 (twelve) hours., Disp: 30 mL, Rfl: 0   rosuvastatin  (CRESTOR ) 10 MG tablet, Take 1 tablet (10 mg total) by mouth daily., Disp: 90 tablet, Rfl: 3   Semaglutide,0.25 or 0.5MG /DOS, (OZEMPIC, 0.25 OR 0.5 MG/DOSE,) 2 MG/1.5ML SOPN, See admin instructions.,  Disp: , Rfl:    triamcinolone  cream (KENALOG ) 0.1 %, Apply 1 Application topically 2 (two) times daily., Disp: 30 g, Rfl: 0   Medications ordered in this encounter:  Meds ordered this encounter  Medications   predniSONE  (DELTASONE ) 20 MG tablet    Sig: Take 2 tablets (40 mg total) by mouth daily with breakfast.    Dispense:  10 tablet    Refill:  0    Supervising Provider:   LAMPTEY, PHILIP O [[id number]]   brompheniramine-pseudoephedrine-DM 30-2-10 MG/5ML syrup    Sig: Take 5 mLs by mouth 4 (four) times daily as needed.    Dispense:  120 mL    Refill:  0    Supervising Provider:   BLAISE ALEENE KIDD [[id number]]     *If you need refills on other medications prior to your next appointment, please contact your pharmacy*  Follow-Up: Call back or seek an in-person evaluation if the symptoms worsen or if the condition fails to improve as anticipated.   Virtual Care [phone]  Other Instructions  Upper Respiratory Infection, Adult An upper respiratory infection (URI) is a common viral infection of the nose, throat, and upper air passages that lead to the lungs. The most common type of URI is the common cold. URIs usually get better on their own, without medical treatment. What are the causes? A URI is caused by a virus. You may catch a virus by: Breathing in droplets from an infected person's cough or sneeze. Touching something  that has been exposed to the virus (is contaminated) and then touching your mouth, nose, or eyes. What increases the risk? You are more likely to get a URI if: You are very young or very old. You have close contact with others, such as at work, school, or a health care facility. You smoke. You have long-term (chronic) heart or lung disease. You have a weakened disease-fighting system (immune system). You have nasal allergies or asthma. You are experiencing a lot of stress. You have poor nutrition. What are the signs or symptoms? A URI usually  involves some of the following symptoms: Runny or stuffy (congested) nose. Cough. Sneezing. Sore throat. Headache. Fatigue. Fever. Loss of appetite. Pain in your forehead, behind your eyes, and over your cheekbones (sinus pain). Muscle aches. Redness or irritation of the eyes. Pressure in the ears or face. How is this diagnosed? This condition may be diagnosed based on your medical history and symptoms, and a physical exam. Your health care provider may use a swab to take a mucus sample from your nose (nasal swab). This sample can be tested to determine what virus is causing the illness. How is this treated? URIs usually get better on their own within 7-10 days. Medicines cannot cure URIs, but your health care provider may recommend certain medicines to help relieve symptoms, such as: Over-the-counter cold medicines. Cough suppressants. Coughing is a type of defense against infection that helps to clear the respiratory system, so take these medicines only as recommended by your health care provider. Fever-reducing medicines. Follow these instructions at home: Activity Rest as needed. If you have a fever, stay home from work or school until your fever is gone or until your health care provider says your URI cannot spread to other people (is no longer contagious). Your health care provider may have you wear a face mask to prevent your infection from spreading. Relieving symptoms Gargle with a mixture of salt and water 3-4 times a day or as needed. To make salt water, completely dissolve -1 tsp (3-6 g) of salt in 1 cup (237 mL) of warm water. Use a cool-mist humidifier to add moisture to the air. This can help you breathe more easily. Eating and drinking  Drink enough fluid to keep your urine pale yellow. Eat soups and other clear broths. General instructions  Take over-the-counter and prescription medicines only as told by your health care provider. These include cold medicines, fever  reducers, and cough suppressants. Do not use any products that contain nicotine or tobacco. These products include cigarettes, chewing tobacco, and vaping devices, such as e-cigarettes. If you need help quitting, ask your health care provider. Stay away from secondhand smoke. Stay up to date on all immunizations, including the yearly (annual) flu vaccine. Keep all follow-up visits. This is important. How to prevent the spread of infection to others URIs can be contagious. To prevent the infection from spreading: Wash your hands with soap and water for at least 20 seconds. If soap and water are not available, use hand sanitizer. Avoid touching your mouth, face, eyes, or nose. Cough or sneeze into a tissue or your sleeve or elbow instead of into your hand or into the air.  Contact a health care provider if: You are getting worse instead of better. You have a fever or chills. Your mucus is brown or red. You have yellow or brown discharge coming from your nose. You have pain in your face, especially when you bend forward. You have swollen neck  glands. You have pain while swallowing. You have white areas in the back of your throat. Get help right away if: You have shortness of breath that gets worse. You have severe or persistent: Headache. Ear pain. Sinus pain. Chest pain. You have chronic lung disease along with any of the following: Making high-pitched whistling sounds when you breathe, most often when you breathe out (wheezing). Prolonged cough (more than 14 days). Coughing up blood. A change in your usual mucus. You have a stiff neck. You have changes in your: Vision. Hearing. Thinking. Mood. These symptoms may be an emergency. Get help right away. Call 911. Do not wait to see if the symptoms will go away. Do not drive yourself to the hospital. Summary An upper respiratory infection (URI) is a common infection of the nose, throat, and upper air passages that lead to the  lungs. A URI is caused by a virus. URIs usually get better on their own within 7-10 days. Medicines cannot cure URIs, but your health care provider may recommend certain medicines to help relieve symptoms. This information is not intended to replace advice given to you by your health care provider. Make sure you discuss any questions you have with your health care provider. Document Revised: 12/17/2020 Document Reviewed: 12/17/2020 Elsevier Patient Education  2024 Elsevier Inc.   If you have been instructed to have an in-person evaluation today at a local Urgent Care facility, please use the link below. It will take you to a list of all of our available [facility], including address, phone number and hours of operation. Please do not delay care.  [facility]  If you or a family member do not have a primary care provider, use the link below to schedule a visit and establish care. When you choose a [facility] primary care physician or advanced practice provider, you gain a long-term partner in health. Find a Primary Care Provider  Learn more about [facility] in-office and virtual care options: [facility] - Get Care Now

## 2024-05-14 NOTE — Progress Notes (Signed)
 Virtual Visit Consent   Bruce Morales, you are scheduled for a virtual visit with a [facility] provider today. Just as with appointments in the office, your consent must be obtained to participate. Your consent will be active for this visit and any virtual visit you may have with one of our providers in the next 365 days. If you have a MyChart account, a copy of this consent can be sent to you electronically.  As this is a virtual visit, video technology does not allow for your provider to perform a traditional examination. This may limit your provider's ability to fully assess your condition. If your provider identifies any concerns that need to be evaluated in person or the need to arrange testing (such as labs, EKG, etc.), we will make arrangements to do so. Although advances in technology are sophisticated, we cannot ensure that it will always work on either your end or our end. If the connection with a video visit is poor, the visit may have to be switched to a telephone visit. With either a video or telephone visit, we are not always able to ensure that we have a secure connection.  By engaging in this virtual visit, you consent to the provision of healthcare and authorize for your insurance to be billed (if applicable) for the services provided during this visit. Depending on your insurance coverage, you may receive a charge related to this service.  I need to obtain your verbal consent now. Are you willing to proceed with your visit today? Bruce Morales has provided verbal consent on 05/14/2024 for a virtual visit (video or telephone). Bruce CHRISTELLA Dickinson, PA-C  Date: 05/14/2024 10:44 AM   Virtual Visit via Video Note   I, Bruce Morales, connected with  Bruce Morales  ([id number], Mar 10, 1976) on 05/14/2024 at 10:30 AM EST by a video-enabled telemedicine application and verified that I am speaking with the correct person using two identifiers.  Location: Patient:  Virtual Visit Location Patient: Home Provider: Virtual Visit Location Provider: Home Office   I discussed the limitations of evaluation and management by telemedicine and the availability of in person appointments. The patient expressed understanding and agreed to proceed.    History of Present Illness: Bruce Morales is a 48 y.o. who identifies as a male who was assigned male at birth, and is being seen today for URI symptoms.  HPI: URI  This is a new problem. The current episode started in the past 7 days. The problem has been gradually worsening. There has been no fever. Associated symptoms include congestion, coughing (throat clearing from post nasal drainage), diarrhea (more frequent), headaches, rhinorrhea (and post nasal drainage) and a sore throat (scratchy). Pertinent negatives include no chest pain, ear pain, nausea, plugged ear sensation, sinus pain, vomiting or wheezing. Associated symptoms comments: Body aches. Treatments tried: Mucinex. The treatment provided no relief.    Problems:  Patient Active Problem List   Diagnosis Date Noted   Myelopathy (HCC) 04/03/2024   Simple chronic bronchitis (HCC) 04/03/2024   Diarrhea of presumed infectious origin 04/03/2024   Cervical spine disease 10/05/2022   Dyslipidemia 04/05/2022   Hepatic steatosis 09/30/2021   History of tobacco use 02/06/2020   Other hyperlipidemia 07/18/2018   Depression 07/18/2018   Prediabetes 05/17/2018   Vitamin D  deficiency 05/17/2018   Hearing loss 09/04/2015   Thrombocytopenia    Pancytopenia (HCC) 06/24/2015   Status post lumbar laminectomy 06/10/2015    Allergies: Allergies[1] Medications: Current Medications[2]  Observations/Objective:  Patient is well-developed, well-nourished in no acute distress.  Resting comfortably at home.  Head is normocephalic, atraumatic.  No labored breathing.  Speech is clear and coherent with logical content.  Patient is alert and oriented at baseline.     Assessment and Plan: 1. Viral URI with cough (Primary) - predniSONE  (DELTASONE ) 20 MG tablet; Take 2 tablets (40 mg total) by mouth daily with breakfast.  Dispense: 10 tablet; Refill: 0 - brompheniramine-pseudoephedrine-DM 30-2-10 MG/5ML syrup; Take 5 mLs by mouth 4 (four) times daily as needed.  Dispense: 120 mL; Refill: 0  - Suspect viral URI - Symptomatic medications of choice over the counter as needed - Prednisone  for inflammation and congestion - Bromfed DM for cough and congestion - May continue Mucinex (PLAIN) during the daytime - Advised to start at home Flonase  - Push fluids - Rest - Seek further evaluation if symptoms change or worsen   Follow Up Instructions: I discussed the assessment and treatment plan with the patient. The patient was provided an opportunity to ask questions and all were answered. The patient agreed with the plan and demonstrated an understanding of the instructions.  A copy of instructions were sent to the patient via MyChart unless otherwise noted below.    The patient was advised to call back or seek an in-person evaluation if the symptoms worsen or if the condition fails to improve as anticipated.    Bruce CHRISTELLA Dickinson, PA-C     [1]  Allergies Allergen Reactions   Other Hives and Itching   Hydrocodone  Hives and Itching   Oxycodone      Mild Itching, patient can tolerate oxycodone    Percocet [Oxycodone -Acetaminophen ]     Itching, patient says he tolerates  [2]  Current Outpatient Medications:    brompheniramine-pseudoephedrine-DM 30-2-10 MG/5ML syrup, Take 5 mLs by mouth 4 (four) times daily as needed., Disp: 120 mL, Rfl: 0   predniSONE  (DELTASONE ) 20 MG tablet, Take 2 tablets (40 mg total) by mouth daily with breakfast., Disp: 10 tablet, Rfl: 0   ascorbic acid (VITAMIN C) 1000 MG tablet, Take by mouth., Disp: , Rfl:    cyclobenzaprine  (FLEXERIL ) 10 MG tablet, Take 1 tablet (10 mg total) by mouth 3 (three) times daily as needed for  muscle spasms., Disp: 30 tablet, Rfl: 3   diltiazem  (CARDIZEM ) 30 MG tablet, TAKE 1 TABLET EVERY DAY AS NEEDED FOR RECTAL SPASM PAINS (Patient taking differently: Take 30 mg by mouth daily. TAKE 1 TABLET EVERY DAY), Disp: 90 tablet, Rfl: 1   GREEN TEA, CAMELLIA SINENSIS, PO, Take by mouth daily in the afternoon., Disp: , Rfl:    HYDROmorphone  (DILAUDID ) 4 MG tablet, Take 4 mg by mouth every 6 (six) hours as needed., Disp: , Rfl:    ipratropium (ATROVENT ) 0.03 % nasal spray, Place 2 sprays into both nostrils every 12 (twelve) hours., Disp: 30 mL, Rfl: 0   rosuvastatin  (CRESTOR ) 10 MG tablet, Take 1 tablet (10 mg total) by mouth daily., Disp: 90 tablet, Rfl: 3   Semaglutide,0.25 or 0.5MG /DOS, (OZEMPIC, 0.25 OR 0.5 MG/DOSE,) 2 MG/1.5ML SOPN, See admin instructions., Disp: , Rfl:    triamcinolone  cream (KENALOG ) 0.1 %, Apply 1 Application topically 2 (two) times daily., Disp: 30 g, Rfl: 0

## 2024-05-18 ENCOUNTER — Other Ambulatory Visit: Payer: Self-pay | Admitting: Emergency Medicine

## 2024-05-18 DIAGNOSIS — M62838 Other muscle spasm: Secondary | ICD-10-CM

## 2024-06-06 ENCOUNTER — Emergency Department (HOSPITAL_COMMUNITY)
Admission: EM | Admit: 2024-06-06 | Discharge: 2024-06-07 | Discharge status: Against Medical Advice | Source: Ambulatory Visit | Attending: Emergency Medicine | Admitting: Emergency Medicine

## 2024-06-06 ENCOUNTER — Telehealth: Admitting: Physician Assistant

## 2024-06-06 ENCOUNTER — Other Ambulatory Visit: Payer: Self-pay

## 2024-06-06 ENCOUNTER — Encounter (HOSPITAL_COMMUNITY): Payer: Self-pay

## 2024-06-06 ENCOUNTER — Emergency Department (HOSPITAL_COMMUNITY)

## 2024-06-06 DIAGNOSIS — R079 Chest pain, unspecified: Secondary | ICD-10-CM | POA: Insufficient documentation

## 2024-06-06 DIAGNOSIS — R197 Diarrhea, unspecified: Secondary | ICD-10-CM | POA: Diagnosis present

## 2024-06-06 DIAGNOSIS — Z5321 Procedure and treatment not carried out due to patient leaving prior to being seen by health care provider: Secondary | ICD-10-CM | POA: Insufficient documentation

## 2024-06-06 DIAGNOSIS — K921 Melena: Secondary | ICD-10-CM | POA: Diagnosis not present

## 2024-06-06 DIAGNOSIS — A041 Enterotoxigenic Escherichia coli infection: Secondary | ICD-10-CM

## 2024-06-06 DIAGNOSIS — E860 Dehydration: Secondary | ICD-10-CM

## 2024-06-06 LAB — TROPONIN T, HIGH SENSITIVITY: Troponin T High Sensitivity: <15 ng/L (ref 0–19)

## 2024-06-06 LAB — CBC
HCT: 45.4 % (ref 39.0–52.0)
Hemoglobin: 15.1 g/dL (ref 13.0–17.0)
MCH: 29.9 pg (ref 26.0–34.0)
MCHC: 33.3 g/dL (ref 30.0–36.0)
MCV: 89.9 fL (ref 80.0–100.0)
Platelets: 121 K/uL — ABNORMAL LOW (ref 150–400)
RBC: 5.05 MIL/uL (ref 4.22–5.81)
RDW: 12.1 % (ref 11.5–15.5)
WBC: 7.3 K/uL (ref 4.0–10.5)
nRBC: 0 % (ref 0.0–0.2)

## 2024-06-06 LAB — URINALYSIS, ROUTINE W REFLEX MICROSCOPIC
Bilirubin Urine: NEGATIVE
Glucose, UA: NEGATIVE mg/dL
Hgb urine dipstick: NEGATIVE
Ketones, ur: NEGATIVE mg/dL
Leukocytes,Ua: NEGATIVE
Nitrite: NEGATIVE
Protein, ur: NEGATIVE mg/dL
Specific Gravity, Urine: 1.019 (ref 1.005–1.030)
pH: 5 (ref 5.0–8.0)

## 2024-06-06 LAB — COMPREHENSIVE METABOLIC PANEL WITH GFR
ALT: 49 U/L — ABNORMAL HIGH (ref 0–44)
AST: 60 U/L — ABNORMAL HIGH (ref 15–41)
Albumin: 4.5 g/dL (ref 3.5–5.0)
Alkaline Phosphatase: 54 U/L (ref 38–126)
Anion gap: 10 (ref 5–15)
BUN: 22 mg/dL — ABNORMAL HIGH (ref 6–20)
CO2: 25 mmol/L (ref 22–32)
Calcium: 9.1 mg/dL (ref 8.9–10.3)
Chloride: 104 mmol/L (ref 98–111)
Creatinine, Ser: 1.08 mg/dL (ref 0.61–1.24)
GFR, Estimated: >60 mL/min
Glucose, Bld: 103 mg/dL — ABNORMAL HIGH (ref 70–99)
Potassium: 5.3 mmol/L — ABNORMAL HIGH (ref 3.5–5.1)
Sodium: 138 mmol/L (ref 135–145)
Total Bilirubin: 0.4 mg/dL (ref 0.0–1.2)
Total Protein: 7.1 g/dL (ref 6.5–8.1)

## 2024-06-06 LAB — LIPASE, BLOOD: Lipase: 41 U/L (ref 11–51)

## 2024-06-06 NOTE — Progress Notes (Signed)
 " Virtual Visit Consent   Bruce Morales, you are scheduled for a virtual visit with a [facility] provider today. Just as with appointments in the office, your consent must be obtained to participate. Your consent will be active for this visit and any virtual visit you may have with one of our providers in the next 365 days. If you have a MyChart account, a copy of this consent can be sent to you electronically.  As this is a virtual visit, video technology does not allow for your provider to perform a traditional examination. This may limit your provider's ability to fully assess your condition. If your provider identifies any concerns that need to be evaluated in person or the need to arrange testing (such as labs, EKG, etc.), we will make arrangements to do so. Although advances in technology are sophisticated, we cannot ensure that it will always work on either your end or our end. If the connection with a video visit is poor, the visit may have to be switched to a telephone visit. With either a video or telephone visit, we are not always able to ensure that we have a secure connection.  By engaging in this virtual visit, you consent to the provision of healthcare and authorize for your insurance to be billed (if applicable) for the services provided during this visit. Depending on your insurance coverage, you may receive a charge related to this service.  I need to obtain your verbal consent now. Are you willing to proceed with your visit today? Bruce Morales has provided verbal consent on 06/06/2024 for a virtual visit (video or telephone). [name], PA-C  Date: 06/06/2024 5:19 PM   Virtual Visit via Video Note   I, [name], connected with  Bruce Morales  ([id number], [birth year]) on 06/06/2024 at  5:00 PM EST by a video-enabled telemedicine application and verified that I am speaking with the correct person using two identifiers.  Location: Patient: Virtual  Visit Location Patient: Home Provider: Virtual Visit Location Provider: Home Office   I discussed the limitations of evaluation and management by telemedicine and the availability of in person appointments. The patient expressed understanding and agreed to proceed.    History of Present Illness: Bruce Morales is a 49 y.o. who identifies as a male who was assigned male at birth, and is being seen today for diarrhea.  HPI: Diarrhea  This is a chronic problem. The current episode started more than 1 month ago (Started in October 2025, seen by PCP on 04/03/24 and had GI profile which was positive for Enterotoxigenic E.coli. Treated with Cipro  with no relief). The problem occurs more than 10 times per day. The problem has been gradually worsening. The stool consistency is described as Mucous and watery. The patient states that diarrhea awakens him from sleep. Associated symptoms include abdominal pain, bloating, chills, a fever, headaches, increased flatus and myalgias. Pertinent negatives include no vomiting or weight loss. Associated symptoms comments: hemorrhoid. Nothing aggravates the symptoms. There are no known risk factors. He has tried anti-motility drug, bismuth subsalicylate, change of diet and increased fluids (cipro ) for the symptoms. The treatment provided no relief.     Problems:  Patient Active Problem List   Diagnosis Date Noted   Myelopathy (HCC) 04/03/2024   Simple chronic bronchitis (HCC) 04/03/2024   Diarrhea of presumed infectious origin 04/03/2024   Cervical spine disease 10/05/2022   Dyslipidemia 04/05/2022   Hepatic steatosis 09/30/2021   History of tobacco  use 02/06/2020   Other hyperlipidemia 07/18/2018   Depression 07/18/2018   Prediabetes 05/17/2018   Vitamin D  deficiency 05/17/2018   Hearing loss 09/04/2015   Thrombocytopenia    Pancytopenia (HCC) 06/24/2015   Status post lumbar laminectomy 06/10/2015    Allergies: Allergies[1] Medications: Current  Medications[2]  Observations/Objective: Patient is well-developed, well-nourished in no acute distress.  Resting comfortably at home.  Head is normocephalic, atraumatic.  No labored breathing.  Speech is clear and coherent with logical content.  Patient is alert and oriented at baseline.    Assessment and Plan: 1. Intestinal infection due to enterotoxigenic E. coli (Primary)  2. Dehydration  - Patient with continued diarrhea, abdominal pain, bloating, now worsening with fevers, chills, body aches, lack of sleep due to abdominal pain - Diarrhea present since Oct '25, completed Cipro  for ETEC infection positive on GI profile - Symptoms lessened but never resolved and over the last week have returned in full force and worsening - Patient reports feeling weak and dehydrated from fluid loss despite trying to push fluids, no appetite, not sleeping due to abdominal cramping/pain - Advised he should seek immediate medical evaluation at the closest ER facility due to severity, dehydration risk, and recent positive e.coli profile without improvements  Follow Up Instructions: I discussed the assessment and treatment plan with the patient. The patient was provided an opportunity to ask questions and all were answered. The patient agreed with the plan and demonstrated an understanding of the instructions.  A copy of instructions were sent to the patient via MyChart unless otherwise noted below.    The patient was advised to call back or seek an in-person evaluation if the symptoms worsen or if the condition fails to improve as anticipated.    [name], PA-C     [1]  Allergies Allergen Reactions   Other Hives and Itching   Hydrocodone  Hives and Itching   Oxycodone      Mild Itching, patient can tolerate oxycodone    Percocet [Oxycodone -Acetaminophen ]     Itching, patient says he tolerates  [2]  Current Outpatient Medications:    ascorbic acid (VITAMIN C) 1000 MG tablet, Take by  mouth., Disp: , Rfl:    brompheniramine-pseudoephedrine-DM 30-2-10 MG/5ML syrup, Take 5 mLs by mouth 4 (four) times daily as needed., Disp: 120 mL, Rfl: 0   cyclobenzaprine  (FLEXERIL ) 10 MG tablet, Take 1 tablet (10 mg total) by mouth 3 (three) times daily as needed for muscle spasms., Disp: 30 tablet, Rfl: 3   diltiazem  (CARDIZEM ) 30 MG tablet, TAKE 1 TABLET EVERY DAY AS NEEDED FOR RECTAL SPASM PAINS (Patient taking differently: Take 30 mg by mouth daily. TAKE 1 TABLET EVERY DAY), Disp: 90 tablet, Rfl: 1   GREEN TEA, CAMELLIA SINENSIS, PO, Take by mouth daily in the afternoon., Disp: , Rfl:    HYDROmorphone  (DILAUDID ) 4 MG tablet, Take 4 mg by mouth every 6 (six) hours as needed., Disp: , Rfl:    ipratropium (ATROVENT ) 0.03 % nasal spray, Place 2 sprays into both nostrils every 12 (twelve) hours., Disp: 30 mL, Rfl: 0   predniSONE  (DELTASONE ) 20 MG tablet, Take 2 tablets (40 mg total) by mouth daily with breakfast., Disp: 10 tablet, Rfl: 0   rosuvastatin  (CRESTOR ) 10 MG tablet, Take 1 tablet (10 mg total) by mouth daily., Disp: 90 tablet, Rfl: 3   Semaglutide,0.25 or 0.5MG /DOS, (OZEMPIC, 0.25 OR 0.5 MG/DOSE,) 2 MG/1.5ML SOPN, See admin instructions., Disp: , Rfl:    triamcinolone  cream (KENALOG ) 0.1 %, Apply 1  Application topically 2 (two) times daily., Disp: 30 g, Rfl: 0  "

## 2024-06-06 NOTE — ED Triage Notes (Signed)
 Pt. Arrives for diarrhea. States that he was seen previously and given antibiotics but he has not had relief. States his stools are black. He is also having chest pain. Told that he needed to come to the ER for possible dehydration by his PCP.

## 2024-06-06 NOTE — Patient Instructions (Signed)
 " Bruce Morales, thank you for joining Bruce CHRISTELLA Dickinson, PA-C for today's virtual visit.  While this provider is not your primary care provider (PCP), if your PCP is located in our provider database this encounter information will be shared with them immediately following your visit.   A [facility] MyChart account gives you access to today's visit and all your visits, tests, and labs performed at [facility]  click here if you don't have a [facility] MyChart account or go to [email or web address]  Consent: (Patient) Bruce Morales provided verbal consent for this virtual visit at the beginning of the encounter.  Current Medications:  Current Outpatient Medications:    ascorbic acid (VITAMIN C) 1000 MG tablet, Take by mouth., Disp: , Rfl:    brompheniramine-pseudoephedrine-DM 30-2-10 MG/5ML syrup, Take 5 mLs by mouth 4 (four) times daily as needed., Disp: 120 mL, Rfl: 0   cyclobenzaprine  (FLEXERIL ) 10 MG tablet, Take 1 tablet (10 mg total) by mouth 3 (three) times daily as needed for muscle spasms., Disp: 30 tablet, Rfl: 3   diltiazem  (CARDIZEM ) 30 MG tablet, TAKE 1 TABLET EVERY DAY AS NEEDED FOR RECTAL SPASM PAINS (Patient taking differently: Take 30 mg by mouth daily. TAKE 1 TABLET EVERY DAY), Disp: 90 tablet, Rfl: 1   GREEN TEA, CAMELLIA SINENSIS, PO, Take by mouth daily in the afternoon., Disp: , Rfl:    HYDROmorphone  (DILAUDID ) 4 MG tablet, Take 4 mg by mouth every 6 (six) hours as needed., Disp: , Rfl:    ipratropium (ATROVENT ) 0.03 % nasal spray, Place 2 sprays into both nostrils every 12 (twelve) hours., Disp: 30 mL, Rfl: 0   predniSONE  (DELTASONE ) 20 MG tablet, Take 2 tablets (40 mg total) by mouth daily with breakfast., Disp: 10 tablet, Rfl: 0   rosuvastatin  (CRESTOR ) 10 MG tablet, Take 1 tablet (10 mg total) by mouth daily., Disp: 90 tablet, Rfl: 3   Semaglutide,0.25 or 0.5MG /DOS, (OZEMPIC, 0.25 OR 0.5 MG/DOSE,) 2 MG/1.5ML SOPN, See admin instructions.,  Disp: , Rfl:    triamcinolone  cream (KENALOG ) 0.1 %, Apply 1 Application topically 2 (two) times daily., Disp: 30 g, Rfl: 0   Medications ordered in this encounter:  No orders of the defined types were placed in this encounter.    *If you need refills on other medications prior to your next appointment, please contact your pharmacy*  Follow-Up: Call back or seek an in-person evaluation if the symptoms worsen or if the condition fails to improve as anticipated.  [facility] [phone]  Other Instructions  E. Coli Infection E. coli (Escherichia coli) are bacteria that can cause an infection in different parts of your body, including your intestines. E. coli bacteria normally live in the intestines of people and animals. Most types of E. coli do not cause infections, but some produce a poison (toxin) that can cause diarrhea. Depending on the toxin, this can cause mild or severe diarrhea. This condition is contagious. This means that it can spread from person to person. It can also spread from animals to humans. Most cases of E. coli infection come from cows (cattle). In some cases, this infection can cause a dangerous complication called hemolytic uremic syndrome (HUS). HUS leads to blood cell abnormalities and kidney failure. What are the causes? This condition is caused by E. coli bacteria. You may get this bacteria by: Eating raw or undercooked beef. Touching an infected animal and then touching your mouth. Eating raw fruits or vegetables that have come into  contact with the stool (feces) of infected animals. Drinking fluids that have been contaminated with E. coli from infected animals. Coming into contact with a surface that has been contaminated by an infected person. What increases the risk? This condition is more likely to develop in people who: Are young children or older adults. Eat raw or undercooked beef. Drink raw (unpasteurized) milk, cider, or juice. Eat  cheeses made from unpasteurized milk. Eat raw vegetables such as spinach or lettuce. Are in close contact with cattle, goats, or sheep. Have a weak body defense system (immune system). What are the signs or symptoms? Symptoms of this condition usually start 3-4 days after the bacteria were swallowed (ingested). Symptoms include: Severe cramps and tenderness in the abdomen. Diarrhea. This may be watery or bloody. Nausea and vomiting. Dehydration. This can cause fatigue, thirst, a dry mouth, and less frequent urination. Low fever. This is not common. How is this diagnosed? This condition may be diagnosed based on: A medical history. A physical exam. A stool culture. This involves testing a sample of your stool for E. coli or toxins of E. coli. How is this treated? Treatment for this condition includes rest and fluids (supportive care). If you have severe diarrhea, you may need to receive fluids through an IV. Symptoms of E. coli intestinal infection usually go away in 5-10 days. Some strains of E. coli may be treated with antibiotic or antidiarrheal medicines. However, these medicines are rarely given because they increase your risk for HUS. Follow these instructions at home: Eating and drinking     Drink enough fluid to keep your urine pale yellow. You may need to drink small amounts of clear liquids frequently. Take an oral rehydration solution (ORS) as told by your health care provider. This drink is sold at pharmacies and retail stores. Drink clear fluids, such as water, ice chips, diluted fruit juice, and low-calorie sports drinks. Eat bland, easy-to-digest foods in small amounts as you are able. These foods include bananas, applesauce, rice, lean meats, toast, and crackers. Eat small, frequent meals rather than large meals. Do not drink milk, caffeine, or alcohol. Food safety Do not eat: Raw or undercooked beef. Cheese that was made with unpasteurized milk. Do not  drink: Unpasteurized milk. Unpasteurized apple cider. Wash cutting boards, counters, and utensils with hot, soapy water after you prepare raw meat. Wash all fruits and vegetables before you eat or cook them. General instructions  Take over-the-counter and prescription medicines only as told by your health care provider. Wash your hands thoroughly with soap and water for at least 20 seconds: Before and after you prepare food. After you use the bathroom. Before you eat. After touching animals, especially cattle. After caring for an ill person. Make sure people who live with you also wash their hands often. If soap and water are not available, use alcohol-based hand sanitizer. Clean surfaces that you touch with a product that contains chlorine bleach. Keep all follow-up visits. This is important. Contact a health care provider if: Your symptoms do not get better or get worse. You have new symptoms. Get help right away if you: Have increasing pain or tenderness in your abdomen. Have ongoing (persistent) vomiting or diarrhea. Have abdominal pain that stays in one area (localizes). Have diarrhea with more blood in it. Have a fever. Cannot eat or drink without vomiting. Have signs of dehydration or HUS, such as: Pale skin. Dark urine, very little urine, or no urine. Cracked lips. Not making tears while crying.  Dry mouth. Sunken eyes. Sleepiness. Weakness. Dizziness. Summary E. coli are bacteria that can cause an infection in different parts of your body, including your intestines. Most types of E. coli do not cause infections, but some produce a toxin that can cause diarrhea. Treatment for this condition includes rest and fluids. If you have severe diarrhea, you may need to receive fluids through an IV. Symptoms of E. coli intestinal infection usually go away in 5-10 days. Follow your health care provider's instructions about medicines, eating and drinking, food safety and general  hygiene, and when to call for help. This information is not intended to replace advice given to you by your health care provider. Make sure you discuss any questions you have with your health care provider. Document Revised: 11/28/2020 Document Reviewed: 11/28/2020 Elsevier Patient Education  2024 Elsevier Inc.   If you have been instructed to have an in-person evaluation today at a local Urgent Care facility, please use the link below. It will take you to a list of all of our available [facility], including address, phone number and hours of operation. Please do not delay care.  [facility]  If you or a family member do not have a primary care provider, use the link below to schedule a visit and establish care. When you choose a [facility] primary care physician or advanced practice provider, you gain a long-term partner in health. Find a Primary Care Provider  Learn more about [facility] in-office and virtual care options: [facility] - Get Care Now "

## 2024-06-07 ENCOUNTER — Telehealth: Admitting: Emergency Medicine

## 2024-06-07 ENCOUNTER — Ambulatory Visit: Payer: Self-pay

## 2024-06-07 DIAGNOSIS — K529 Noninfective gastroenteritis and colitis, unspecified: Secondary | ICD-10-CM | POA: Diagnosis not present

## 2024-06-07 DIAGNOSIS — K921 Melena: Secondary | ICD-10-CM | POA: Diagnosis not present

## 2024-06-07 NOTE — Telephone Encounter (Signed)
 Still having symptoms.  Needs to be reevaluated again.

## 2024-06-07 NOTE — Telephone Encounter (Signed)
 FYI Only or Action Required?: Action required by provider: clinical question for provider.  Patient was last seen in primary care on 06/07/2024 by [name], NP.  Called Nurse Triage reporting Diarrhea.  Symptoms began several days ago.  Interventions attempted: Rest, hydration, or home remedies.  Symptoms are: unchanged.  Triage Disposition: See PCP Within 2 Weeks  Patient/caregiver understands and will follow disposition?: no  Copied from CRM #[id number]. Topic: Clinical - Red Word Triage >> Jun 07, 2024 12:47 PM Harlene ORN wrote: Red Word that prompted transfer to Nurse Triage: Patient was seen for E.Coli and was given medication for treatment, but was not effective. Still having black and loose stools. Reason for Disposition  Diarrhea is a chronic symptom (recurrent or ongoing AND present > 4 weeks)  Answer Assessment - Initial Assessment Questions 1. DIARRHEA SEVERITY: How bad is the diarrhea? How many more stools have you had in the past 24 hours than normal?      Intermittent diarrhea for months, states that he was treated for ecoli and cipro  was given, pt states that his s/s are back 2. ONSET: When did the diarrhea begin?      months 3. STOOL DESCRIPTION:  How loose or watery is the diarrhea? What is the stool color? Is there any blood or mucous in the stool?     Black loose 4. VOMITING: Are you also vomiting? If Yes, ask: How many times in the past 24 hours?      denies 5. ABDOMEN PAIN: Are you having any abdomen pain? If Yes, ask: What does it feel like? (e.g., crampy, dull, intermittent, constant)      All over 6. ABDOMEN PAIN SEVERITY: If present, ask: How bad is the pain?  (e.g., Scale 1-10; mild, moderate, or severe)     Sharp, could be gas 8. HYDRATION: Any signs of dehydration? (e.g., dry mouth [not just dry lips], too weak to stand, dizziness, new weight loss) When did you last urinate?     I'm dehydrated, 9. EXPOSURE: Have you  traveled to a foreign country recently? Have you been exposed to anyone with diarrhea? Could you have eaten any food that was spoiled?     Denies  10. ANTIBIOTIC USE: Are you taking antibiotics now or have you taken antibiotics in the past 2 months?       Denies since taking the cipro  11. OTHER SYMPTOMS: Do you have any other symptoms? (e.g., fever, blood in stool)       Denies  Pt requesting a stool sample order.  Protocols used: [facility]

## 2024-06-07 NOTE — ED Notes (Signed)
 Patient pulled IV out and left the lobby.

## 2024-06-07 NOTE — Progress Notes (Signed)
 " Virtual Visit Consent   Bruce Morales, you are scheduled for a virtual visit with a [facility] provider today. Just as with appointments in the office, your consent must be obtained to participate. Your consent will be active for this visit and any virtual visit you may have with one of our providers in the next 365 days. If you have a MyChart account, a copy of this consent can be sent to you electronically.  As this is a virtual visit, video technology does not allow for your provider to perform a traditional examination. This may limit your provider's ability to fully assess your condition. If your provider identifies any concerns that need to be evaluated in person or the need to arrange testing (such as labs, EKG, etc.), we will make arrangements to do so. Although advances in technology are sophisticated, we cannot ensure that it will always work on either your end or our end. If the connection with a video visit is poor, the visit may have to be switched to a telephone visit. With either a video or telephone visit, we are not always able to ensure that we have a secure connection.  By engaging in this virtual visit, you consent to the provision of healthcare and authorize for your insurance to be billed (if applicable) for the services provided during this visit. Depending on your insurance coverage, you may receive a charge related to this service.  I need to obtain your verbal consent now. Are you willing to proceed with your visit today? Bruce Morales has provided verbal consent on 06/07/2024 for a virtual visit (video or telephone). Jon CHRISTELLA Belt, NP  Date: 06/07/2024 12:44 PM   Virtual Visit via Video Note   I, Jon CHRISTELLA Belt, connected with  Bruce Morales  ([id number], 1975-07-26) on 06/07/2024 at 12:30 PM EST by a video-enabled telemedicine application and verified that I am speaking with the correct person using two identifiers.  Location: Patient: Virtual Visit Location  Patient: Home Provider: Virtual Visit Location Provider: Home Office   I discussed the limitations of evaluation and management by telemedicine and the availability of in person appointments. The patient expressed understanding and agreed to proceed.    History of Present Illness: Bruce Morales is a 49 y.o. who identifies as a male who was assigned male at birth, and is being seen today for f/u on ER visit last night  Main concern is black stool diarrhea, also worried about thrombosed hemorrhoid, chest had been tight (thinks it was something he ate) yesterday, feels fine now.  Sent to ER last night after video virtual urgent care encounter for chest pain and worsening diarrhea with black stools. CXR and Troponin x1 in ED last night were normal. EKG unremarkable - non acute  Hadn't been sleeping because having excessive diarrhea prior to last night video appt. Today only had one small, more formed stool; it was part black in color and part brown. Has not been taking pepto bismol in last several weeks.   Dx with e.coli stool infection in October, took Cipro . Diarrhea never improved after treatment.   At ER BP was high; EKG and CXR were ok. Several hours later, BP was high but a little bit better. He felt front desk people in ER were disrespectful and he left after several hours without completing care.   Platelets low at 121, otherwise CBC normal.   CMP hemolyzed and likely not accurate  HPI: HPI  Problems:  Patient Active  Problem List   Diagnosis Date Noted   Myelopathy (HCC) 04/03/2024   Simple chronic bronchitis (HCC) 04/03/2024   Diarrhea of presumed infectious origin 04/03/2024   Cervical spine disease 10/05/2022   Dyslipidemia 04/05/2022   Hepatic steatosis 09/30/2021   History of tobacco use 02/06/2020   Other hyperlipidemia 07/18/2018   Depression 07/18/2018   Prediabetes 05/17/2018   Vitamin D  deficiency 05/17/2018   Hearing loss 09/04/2015   Thrombocytopenia     Pancytopenia (HCC) 06/24/2015   Status post lumbar laminectomy 06/10/2015    Allergies: Allergies[1] Medications: Current Medications[2]  Observations/Objective: Patient is well-developed, well-nourished in no acute distress.  Resting comfortably  at home.  Head is normocephalic, atraumatic.  No labored breathing.  Speech is clear and coherent with logical content.  Patient is alert and oriented at baseline.    Assessment and Plan: 1. Chronic diarrhea (Primary)  2. Black stool  Needs to see pcp again, likely needs repeat stool testing and/or referral to GI. No chest pain today, testing yesterday in ED reassuring.   Follow Up Instructions: I discussed the assessment and treatment plan with the patient. The patient was provided an opportunity to ask questions and all were answered. The patient agreed with the plan and demonstrated an understanding of the instructions.  A copy of instructions were sent to the patient via MyChart unless otherwise noted below.   The patient was advised to call back or seek an in-person evaluation if the symptoms worsen or if the condition fails to improve as anticipated.    Jon CHRISTELLA Belt, NP     [1]  Allergies Allergen Reactions   Other Hives and Itching   Hydrocodone  Hives and Itching   Oxycodone      Mild Itching, patient can tolerate oxycodone    Percocet [Oxycodone -Acetaminophen ]     Itching, patient says he tolerates  [2]  Current Outpatient Medications:    ascorbic acid (VITAMIN C) 1000 MG tablet, Take by mouth., Disp: , Rfl:    brompheniramine-pseudoephedrine-DM 30-2-10 MG/5ML syrup, Take 5 mLs by mouth 4 (four) times daily as needed., Disp: 120 mL, Rfl: 0   cyclobenzaprine  (FLEXERIL ) 10 MG tablet, Take 1 tablet (10 mg total) by mouth 3 (three) times daily as needed for muscle spasms., Disp: 30 tablet, Rfl: 3   diltiazem  (CARDIZEM ) 30 MG tablet, TAKE 1 TABLET EVERY DAY AS NEEDED FOR RECTAL SPASM PAINS (Patient taking differently: Take  30 mg by mouth daily. TAKE 1 TABLET EVERY DAY), Disp: 90 tablet, Rfl: 1   GREEN TEA, CAMELLIA SINENSIS, PO, Take by mouth daily in the afternoon., Disp: , Rfl:    HYDROmorphone  (DILAUDID ) 4 MG tablet, Take 4 mg by mouth every 6 (six) hours as needed., Disp: , Rfl:    ipratropium (ATROVENT ) 0.03 % nasal spray, Place 2 sprays into both nostrils every 12 (twelve) hours., Disp: 30 mL, Rfl: 0   predniSONE  (DELTASONE ) 20 MG tablet, Take 2 tablets (40 mg total) by mouth daily with breakfast., Disp: 10 tablet, Rfl: 0   rosuvastatin  (CRESTOR ) 10 MG tablet, Take 1 tablet (10 mg total) by mouth daily., Disp: 90 tablet, Rfl: 3   Semaglutide,0.25 or 0.5MG /DOS, (OZEMPIC, 0.25 OR 0.5 MG/DOSE,) 2 MG/1.5ML SOPN, See admin instructions., Disp: , Rfl:    triamcinolone  cream (KENALOG ) 0.1 %, Apply 1 Application topically 2 (two) times daily., Disp: 30 g, Rfl: 0  "

## 2024-06-07 NOTE — Patient Instructions (Signed)
" °  Bruce Morales, thank you for joining Jon CHRISTELLA Belt, NP for today's virtual visit.  While this provider is not your primary care provider (PCP), if your PCP is located in our provider database this encounter information will be shared with them immediately following your visit.   A [facility] MyChart account gives you access to today's visit and all your visits, tests, and labs performed at [facility]  click here if you don't have a [facility] MyChart account or go to [email or web address]  Consent: (Patient) Bruce Morales provided verbal consent for this virtual visit at the beginning of the encounter.  Current Medications:  Current Outpatient Medications:    ascorbic acid (VITAMIN C) 1000 MG tablet, Take by mouth., Disp: , Rfl:    brompheniramine-pseudoephedrine-DM 30-2-10 MG/5ML syrup, Take 5 mLs by mouth 4 (four) times daily as needed., Disp: 120 mL, Rfl: 0   cyclobenzaprine  (FLEXERIL ) 10 MG tablet, Take 1 tablet (10 mg total) by mouth 3 (three) times daily as needed for muscle spasms., Disp: 30 tablet, Rfl: 3   diltiazem  (CARDIZEM ) 30 MG tablet, TAKE 1 TABLET EVERY DAY AS NEEDED FOR RECTAL SPASM PAINS (Patient taking differently: Take 30 mg by mouth daily. TAKE 1 TABLET EVERY DAY), Disp: 90 tablet, Rfl: 1   GREEN TEA, CAMELLIA SINENSIS, PO, Take by mouth daily in the afternoon., Disp: , Rfl:    HYDROmorphone  (DILAUDID ) 4 MG tablet, Take 4 mg by mouth every 6 (six) hours as needed., Disp: , Rfl:    ipratropium (ATROVENT ) 0.03 % nasal spray, Place 2 sprays into both nostrils every 12 (twelve) hours., Disp: 30 mL, Rfl: 0   predniSONE  (DELTASONE ) 20 MG tablet, Take 2 tablets (40 mg total) by mouth daily with breakfast., Disp: 10 tablet, Rfl: 0   rosuvastatin  (CRESTOR ) 10 MG tablet, Take 1 tablet (10 mg total) by mouth daily., Disp: 90 tablet, Rfl: 3   Semaglutide,0.25 or 0.5MG /DOS, (OZEMPIC, 0.25 OR 0.5 MG/DOSE,) 2 MG/1.5ML SOPN, See admin instructions., Disp: ,  Rfl:    triamcinolone  cream (KENALOG ) 0.1 %, Apply 1 Application topically 2 (two) times daily., Disp: 30 g, Rfl: 0   Medications ordered in this encounter:  No orders of the defined types were placed in this encounter.    *If you need refills on other medications prior to your next appointment, please contact your pharmacy*  Follow-Up: Call back or seek an in-person evaluation if the symptoms worsen or if the condition fails to improve as anticipated.  [facility] [phone]  Other Instructions  Please follow up with Dr. Purcell about the diarrhea and black stools.    If you have been instructed to have an in-person evaluation today at a local Urgent Care facility, please use the link below. It will take you to a list of all of our available [facility], including address, phone number and hours of operation. Please do not delay care.  [facility]  If you or a family member do not have a primary care provider, use the link below to schedule a visit and establish care. When you choose a [facility] primary care physician or advanced practice provider, you gain a long-term partner in health. Find a Primary Care Provider  Learn more about [facility]'s in-office and virtual care options:  - Get Care Now  "

## 2024-06-07 NOTE — Telephone Encounter (Signed)
 Please advise

## 2024-06-08 ENCOUNTER — Telehealth: Payer: Self-pay | Admitting: Emergency Medicine

## 2024-06-08 NOTE — Telephone Encounter (Unsigned)
 Copied from CRM [id number]. Topic: Clinical - Refused Triage >> Jun 08, 2024 11:37 AM [name] wrote: Patient/caller voiced complaints of Black stool and diarrhea pt wants an appointment to follow up for the ecoli test done in november. Declined transfer to triage.

## 2024-06-11 ENCOUNTER — Encounter: Payer: Self-pay | Admitting: Emergency Medicine

## 2024-06-11 ENCOUNTER — Ambulatory Visit: Admitting: Emergency Medicine

## 2024-06-11 VITALS — BP 144/86 | HR 79 | Temp 98.3°F | Ht 68.0 in | Wt 240.5 lb

## 2024-06-11 DIAGNOSIS — K529 Noninfective gastroenteritis and colitis, unspecified: Secondary | ICD-10-CM | POA: Insufficient documentation

## 2024-06-11 DIAGNOSIS — J410 Simple chronic bronchitis: Secondary | ICD-10-CM

## 2024-06-11 DIAGNOSIS — D61818 Other pancytopenia: Secondary | ICD-10-CM | POA: Diagnosis not present

## 2024-06-11 LAB — COMPREHENSIVE METABOLIC PANEL WITH GFR
ALT: 38 U/L (ref 3–53)
AST: 22 U/L (ref 5–37)
Albumin: 4.5 g/dL (ref 3.5–5.2)
Alkaline Phosphatase: 55 U/L (ref 39–117)
BUN: 24 mg/dL — ABNORMAL HIGH (ref 6–23)
CO2: 28 meq/L (ref 19–32)
Calcium: 8.9 mg/dL (ref 8.4–10.5)
Chloride: 104 meq/L (ref 96–112)
Creatinine, Ser: 1.14 mg/dL (ref 0.40–1.50)
GFR: 77.27 mL/min
Glucose, Bld: 87 mg/dL (ref 70–99)
Potassium: 4.2 meq/L (ref 3.5–5.1)
Sodium: 139 meq/L (ref 135–145)
Total Bilirubin: 0.4 mg/dL (ref 0.2–1.2)
Total Protein: 6.9 g/dL (ref 6.0–8.3)

## 2024-06-11 LAB — CBC WITH DIFFERENTIAL/PLATELET
Basophils Absolute: 0 K/uL (ref 0.0–0.1)
Basophils Relative: 0.6 % (ref 0.0–3.0)
Eosinophils Absolute: 0.2 K/uL (ref 0.0–0.7)
Eosinophils Relative: 3.4 % (ref 0.0–5.0)
HCT: 42.6 % (ref 39.0–52.0)
Hemoglobin: 14.7 g/dL (ref 13.0–17.0)
Lymphocytes Relative: 31.6 % (ref 12.0–46.0)
Lymphs Abs: 2 K/uL (ref 0.7–4.0)
MCHC: 34.4 g/dL (ref 30.0–36.0)
MCV: 88.1 fl (ref 78.0–100.0)
Monocytes Absolute: 0.6 K/uL (ref 0.1–1.0)
Monocytes Relative: 9.3 % (ref 3.0–12.0)
Neutro Abs: 3.4 K/uL (ref 1.4–7.7)
Neutrophils Relative %: 55.1 % (ref 43.0–77.0)
Platelets: 127 K/uL — ABNORMAL LOW (ref 150.0–400.0)
RBC: 4.84 Mil/uL (ref 4.22–5.81)
RDW: 13.2 % (ref 11.5–15.5)
WBC: 6.2 K/uL (ref 4.0–10.5)

## 2024-06-11 MED ORDER — ZEPBOUND 2.5 MG/0.5ML ~~LOC~~ SOAJ: 2.5000 mg | SUBCUTANEOUS | 2 refills | Status: AC

## 2024-06-11 NOTE — Progress Notes (Signed)
 [name] 49 y.o.   Chief Complaint  Patient presents with   Stool Color Change    Been having black stool for a month now, pt is not sure what cause it.     HISTORY OF PRESENT ILLNESS: This is a 49 y.o. male complaining of changes in stool color ranging from light brown to black for over a month Recently seen in emergency department.  Had blood work done.  Results reviewed with patient Has been off Ozempic for several weeks now.  Wants to get back on it Denies nausea or vomiting, fever or chills, abdominal pain History of E. coli enterotoxigenic infection in the stools last October No other complaints or medical concerns today. Wt Readings from Last 3 Encounters:  06/11/24 240 lb 8 oz (109.1 kg)  06/06/24 236 lb 6.4 oz (107.2 kg)  04/03/24 200 lb (90.7 kg)     HPI   Prior to Admission medications  Medication Sig Start Date End Date Taking? Authorizing Provider  ascorbic acid (VITAMIN C) 1000 MG tablet Take by mouth.   Yes [provider]  brompheniramine-pseudoephedrine-DM 30-2-10 MG/5ML syrup Take 5 mLs by mouth 4 (four) times daily as needed. 05/14/24  Yes Vivienne Delon HERO, PA-C  cyclobenzaprine  (FLEXERIL ) 10 MG tablet Take 1 tablet (10 mg total) by mouth 3 (three) times daily as needed for muscle spasms. 05/18/24  Yes Ellie Spickler, Emil Schanz, MD  diltiazem  (CARDIZEM ) 30 MG tablet TAKE 1 TABLET EVERY DAY AS NEEDED FOR RECTAL SPASM PAINS Patient taking differently: Take 30 mg by mouth daily. TAKE 1 TABLET EVERY DAY 07/09/20  Yes Alyra Patty, [place], MD  GREEN TEA, CAMELLIA SINENSIS, PO Take by mouth daily in the afternoon.   Yes [provider]  HYDROmorphone  (DILAUDID ) 4 MG tablet Take 4 mg by mouth every 6 (six) hours as needed. 07/28/21  Yes [provider]  ipratropium (ATROVENT ) 0.03 % nasal spray Place 2 sprays into both nostrils every 12 (twelve) hours. 12/19/22  Yes Vivienne Delon HERO, PA-C  predniSONE  (DELTASONE ) 20 MG tablet Take  2 tablets (40 mg total) by mouth daily with breakfast. 05/14/24  Yes [place], Delon HERO, PA-C  rosuvastatin  (CRESTOR ) 10 MG tablet Take 1 tablet (10 mg total) by mouth daily. 08/27/22  Yes Kate Lonni LITTIE, MD  Semaglutide,0.25 or 0.5MG /DOS, (OZEMPIC, 0.25 OR 0.5 MG/DOSE,) 2 MG/1.5ML SOPN See admin instructions. 09/14/21  Yes [provider]  triamcinolone  cream (KENALOG ) 0.1 % Apply 1 Application topically 2 (two) times daily. 10/13/23  Yes Vivienne Delon HERO, PA-C    Allergies[1]  Patient Active Problem List   Diagnosis Date Noted   Myelopathy (HCC) 04/03/2024   Simple chronic bronchitis (HCC) 04/03/2024   Diarrhea of presumed infectious origin 04/03/2024   Cervical spine disease 10/05/2022   Dyslipidemia 04/05/2022   Hepatic steatosis 09/30/2021   History of tobacco use 02/06/2020   Other hyperlipidemia 07/18/2018   Depression 07/18/2018   Prediabetes 05/17/2018   Vitamin D  deficiency 05/17/2018   Hearing loss 09/04/2015   Thrombocytopenia    Pancytopenia (HCC) 06/24/2015   Status post lumbar laminectomy 06/10/2015    Past Medical History:  Diagnosis Date   Anginal pain 04/15/2014   Arthritis    wrists, knees, lower back (04/16/2014)   Carpal tunnel syndrome    Chronic bronchitis (HCC)    probably get it q yr   Chronic lower back pain    Chronic lower back pain    Constipation    Gall stones  GERD (gastroesophageal reflux disease)    GERD (gastroesophageal reflux disease)    HLD (hyperlipidemia)    Hyperlipidemia    Phreesia 04/14/2020   Joint pain    Meningitis    Plantar fasciitis    Right knee pain    Sciatica     Past Surgical History:  Procedure Laterality Date   CARPAL TUNNEL RELEASE     02/2018 Right arm   CHOLECYSTECTOMY  2011   ESOPHAGOGASTRODUODENOSCOPY N/A 02/27/2013   Procedure: ESOPHAGOGASTRODUODENOSCOPY (EGD);  Surgeon: Lamar LULLA Bunk, MD;  Location: [facility];  Service: Endoscopy;  Laterality: N/A;   KNEE  ARTHROSCOPY Right 2013 X 2   KNEE ARTHROSCOPY Right 02/22/2018   LAMINECTOMY AND MICRODISCECTOMY SPINE     LEFT HEART CATHETERIZATION WITH CORONARY ANGIOGRAM N/A 04/17/2014   Procedure: LEFT HEART CATHETERIZATION WITH CORONARY ANGIOGRAM;  Surgeon: Salena GORMAN Negri, MD;  Location: [facility];  Service: Cardiovascular;  Laterality: N/A;   RADIOLOGY WITH ANESTHESIA N/A 06/11/2015   Procedure: MRI LUMBAR WITH AND WITHOUT CONTRAST;  Surgeon: Medication Radiologist, MD;  Location: [facility] OR;  Service: Radiology;  Laterality: N/A;   SPINE SURGERY N/A    Phreesia 04/14/2020    Social History   Socioeconomic History   Marital status: Married    Spouse name: Clancy    Number of children: 2   Years of education: Not on file   Highest education level: Not on file  Occupational History   Occupation: Disabled  Tobacco Use   Smoking status: Former    Current packs/day: 0.00    Average packs/day: 0.5 packs/day for 27.0 years (13.5 ttl pk-yrs)    Types: Cigarettes    Start date: 03/10/1988    Quit date: 03/11/2015    Years since quitting: 9.2   Smokeless tobacco: Former  Building Services Engineer status: Never Used  Substance and Sexual Activity   Alcohol use: Yes   Drug use: No    Comment: 04/16/2014 in my teens   Sexual activity: Yes  Other Topics Concern   Not on file  Social History Narrative   Drinks 2 cups of coffee in the morning.   Social Drivers of Health   Tobacco Use: Medium Risk (06/06/2024)   Patient History    Smoking Tobacco Use: Former    Smokeless Tobacco Use: Former    Passive Exposure: Not on Actuary Strain: Low Risk (03/07/2024)   Overall Financial Resource Strain (CARDIA)    Difficulty of Paying Living Expenses: Not hard at all  Food Insecurity: No Food Insecurity (03/07/2024)   Epic    Worried About Programme Researcher, Broadcasting/film/video in the Last Year: Never true    Ran Out of Food in the Last Year: Never true  Transportation Needs: No Transportation Needs (03/07/2024)    Epic    Lack of Transportation (Medical): No    Lack of Transportation (Non-Medical): No  Physical Activity: Inactive (03/07/2024)   Exercise Vital Sign    Days of Exercise per Week: 0 days    Minutes of Exercise per Session: 0 min  Stress: No Stress Concern Present (03/07/2024)   [employer] of Occupational Health - Occupational Stress Questionnaire    Feeling of Stress: Not at all  Social Connections: Socially Integrated (03/07/2024)   Social Connection and Isolation Panel    Frequency of Communication with Friends and Family: More than three times a week    Frequency of Social Gatherings with Friends and Family: More than three times a  week    Attends Religious Services: More than 4 times per year    Active Member of Clubs or Organizations: Yes    Attends Banker Meetings: More than 4 times per year    Marital Status: Married  Catering Manager Violence: Not At Risk (03/07/2024)   Epic    Fear of Current or Ex-Partner: No    Emotionally Abused: No    Physically Abused: No    Sexually Abused: No  Depression (PHQ2-9): Low Risk (03/07/2024)   Depression (PHQ2-9)    PHQ-2 Score: 0  Alcohol Screen: Low Risk (03/07/2024)   Alcohol Screen    Last Alcohol Screening Score (AUDIT): 0  Housing: Unknown (03/07/2024)   Epic    Unable to Pay for Housing in the Last Year: No    Number of Times Moved in the Last Year: Not on file    Homeless in the Last Year: No  Utilities: Not At Risk (03/07/2024)   Epic    Threatened with loss of utilities: No  Health Literacy: Adequate Health Literacy (03/07/2024)   B1300 Health Literacy    Frequency of need for help with medical instructions: Never    Family History  Problem Relation Age of Onset   Multiple sclerosis Mother    Obesity Mother    Heart failure Maternal Grandfather    Diabetes Father    Hyperlipidemia Father    Sleep apnea Father    Obesity Father    Fibromyalgia Sister    Schizophrenia Brother    Stroke Maternal  Grandmother    Diabetes Paternal Grandmother      Review of Systems  Constitutional: Negative.  Negative for chills and fever.  HENT: Negative.  Negative for congestion and sore throat.   Respiratory: Negative.  Negative for cough and shortness of breath.   Cardiovascular: Negative.  Negative for chest pain and palpitations.  Gastrointestinal:  Positive for diarrhea. Negative for abdominal pain, blood in stool, nausea and vomiting.  Genitourinary: Negative.  Negative for dysuria and hematuria.  Skin: Negative.  Negative for rash.  Neurological: Negative.  Negative for dizziness and headaches.  All other systems reviewed and are negative.   Vitals:   06/11/24 1521  BP: (!) 144/86  Pulse: 79  Temp: 98.3 F (36.8 C)  SpO2: 98%    Physical Exam Vitals reviewed.  Constitutional:      Appearance: Normal appearance.  HENT:     Head: Normocephalic.     Mouth/Throat:     Mouth: Mucous membranes are moist.     Pharynx: Oropharynx is clear.  Eyes:     Extraocular Movements: Extraocular movements intact.     Pupils: Pupils are equal, round, and reactive to light.  Cardiovascular:     Rate and Rhythm: Normal rate and regular rhythm.     Pulses: Normal pulses.     Heart sounds: Normal heart sounds.  Pulmonary:     Effort: Pulmonary effort is normal.     Breath sounds: Normal breath sounds.  Abdominal:     General: There is no distension.     Palpations: Abdomen is soft. There is no mass.     Tenderness: There is no abdominal tenderness.  Musculoskeletal:     Cervical back: No tenderness.  Lymphadenopathy:     Cervical: No cervical adenopathy.  Skin:    General: Skin is warm and dry.  Neurological:     General: No focal deficit present.     Mental Status: He is alert  and oriented to person, place, and time.  Psychiatric:        Mood and Affect: Mood normal.        Behavior: Behavior normal.      ASSESSMENT & PLAN: A total of 42 minutes was spent with the patient and  counseling/coordination of care regarding preparing for this visit, review of most recent office visit notes, review of multiple chronic medical conditions and their management, differential diagnosis of chronic diarrhea and need for GI evaluation and possible colonoscopy, review of all medications, review of most recent bloodwork results, review of health maintenance items, education on nutrition, prognosis, documentation, and need for follow up.   Problem List Items Addressed This Visit       Respiratory   Simple chronic bronchitis (HCC)   Asymptomatic. No concerns.         Digestive   Chronic diarrhea - Primary   Clinically stable.  No red flag signs or symptoms Benign abdominal examination Differential diagnosis discussed Recommend blood work today Recommend repeat stool sample profile Recommend GI evaluation.  Referral placed today. Diet and nutrition discussed Advised to stay well-hydrated      Relevant Orders   Ambulatory referral to Gastroenterology   Comprehensive metabolic panel with GFR   CBC with Differential/Platelet   GI Profile, Stool, PCR     Hematopoietic and Hemostatic   Pancytopenia (HCC)   Chronic. Stable. No concerns.  Lab Results  Component Value Date   WBC 7.3 06/06/2024   HGB 15.1 06/06/2024   HCT 45.4 06/06/2024   MCV 89.9 06/06/2024   PLT 121 (L) 06/06/2024           Other   Morbid (severe) obesity due to excess calories (HCC)   Diet and nutrition discussed Advised to decrease amount of daily carbohydrate intake and daily calories and increase amount of plant-based protein in his diet Patient did not have GI symptoms when he was taking GLP-1 agonist.  Wants to get back on it.  Recommend Zepbound  weekly. No history of diabetes      Relevant Medications   tirzepatide  (ZEPBOUND ) 2.5 MG/0.5ML Pen   Patient Instructions  Health Maintenance, Male Adopting a healthy lifestyle and getting preventive care are important in promoting health and  wellness. Ask your health care provider about: The right schedule for you to have regular tests and exams. Things you can do on your own to prevent diseases and keep yourself healthy. What should I know about diet, weight, and exercise? Eat a healthy diet  Eat a diet that includes plenty of vegetables, fruits, low-fat dairy products, and lean protein. Do not eat a lot of foods that are high in solid fats, added sugars, or sodium. Maintain a healthy weight Body mass index (BMI) is a measurement that can be used to identify possible weight problems. It estimates body fat based on height and weight. Your health care provider can help determine your BMI and help you achieve or maintain a healthy weight. Get regular exercise Get regular exercise. This is one of the most important things you can do for your health. Most adults should: Exercise for at least 150 minutes each week. The exercise should increase your heart rate and make you sweat (moderate-intensity exercise). Do strengthening exercises at least twice a week. This is in addition to the moderate-intensity exercise. Spend less time sitting. Even light physical activity can be beneficial. Watch cholesterol and blood lipids Have your blood tested for lipids and cholesterol at 47 years of  age, then have this test every 5 years. You may need to have your cholesterol levels checked more often if: Your lipid or cholesterol levels are high. You are older than 47 years of age. You are at high risk for heart disease. What should I know about cancer screening? Many types of cancers can be detected early and may often be prevented. Depending on your health history and family history, you may need to have cancer screening at various ages. This may include screening for: Colorectal cancer. Prostate cancer. Skin cancer. Lung cancer. What should I know about heart disease, diabetes, and high blood pressure? Blood pressure and heart disease High  blood pressure causes heart disease and increases the risk of stroke. This is more likely to develop in people who have high blood pressure readings or are overweight. Talk with your health care provider about your target blood pressure readings. Have your blood pressure checked: Every 3-5 years if you are 57-74 years of age. Every year if you are 76 years old or older. If you are between the ages of 89 and 64 and are a current or former smoker, ask your health care provider if you should have a one-time screening for abdominal aortic aneurysm (AAA). Diabetes Have regular diabetes screenings. This checks your fasting blood sugar level. Have the screening done: Once every three years after age 71 if you are at a normal weight and have a low risk for diabetes. More often and at a younger age if you are overweight or have a high risk for diabetes. What should I know about preventing infection? Hepatitis B If you have a higher risk for hepatitis B, you should be screened for this virus. Talk with your health care provider to find out if you are at risk for hepatitis B infection. Hepatitis C Blood testing is recommended for: Everyone born from [birth year] through 1963. Anyone with known risk factors for hepatitis C. Sexually transmitted infections (STIs) You should be screened each year for STIs, including gonorrhea and chlamydia, if: You are sexually active and are younger than 47 years of age. You are older than 47 years of age and your health care provider tells you that you are at risk for this type of infection. Your sexual activity has changed since you were last screened, and you are at increased risk for chlamydia or gonorrhea. Ask your health care provider if you are at risk. Ask your health care provider about whether you are at high risk for HIV. Your health care provider may recommend a prescription medicine to help prevent HIV infection. If you choose to take medicine to prevent HIV, you  should first get tested for HIV. You should then be tested every 3 months for as long as you are taking the medicine. Follow these instructions at home: Alcohol use Do not drink alcohol if your health care provider tells you not to drink. If you drink alcohol: Limit how much you have to 0-2 drinks a day. Know how much alcohol is in your drink. In the [place] one drink equals one 12 oz bottle of beer (355 mL), one 5 oz glass of wine (148 mL), or one 1 oz glass of hard liquor (44 mL). Lifestyle Do not use any products that contain nicotine or tobacco. These products include cigarettes, chewing tobacco, and vaping devices, such as e-cigarettes. If you need help quitting, ask your health care provider. Do not use street drugs. Do not share needles. Ask your health care provider for  help if you need support or information about quitting drugs. General instructions Schedule regular health, dental, and eye exams. Stay current with your vaccines. Tell your health care provider if: You often feel depressed. You have ever been abused or do not feel safe at home. Summary Adopting a healthy lifestyle and getting preventive care are important in promoting health and wellness. Follow your health care provider's instructions about healthy diet, exercising, and getting tested or screened for diseases. Follow your health care provider's instructions on monitoring your cholesterol and blood pressure. This information is not intended to replace advice given to you by your health care provider. Make sure you discuss any questions you have with your health care provider. Document Revised: 10/06/2020 Document Reviewed: 10/06/2020 Elsevier Patient Education  2024 Elsevier Inc.    Emil Schaumann, MD [facility]    [1]  Allergies Allergen Reactions   Other Hives and Itching   Hydrocodone  Hives and Itching   Methadone Other (See Comments)    methadone   Oxycodone      Mild  Itching, patient can tolerate oxycodone    Percocet [Oxycodone -Acetaminophen ]     Itching, patient says he tolerates

## 2024-06-11 NOTE — Assessment & Plan Note (Signed)
Asymptomatic.  No concerns. 

## 2024-06-11 NOTE — Patient Instructions (Signed)
 Health Maintenance, Male  Adopting a healthy lifestyle and getting preventive care are important in promoting health and wellness. Ask your health care provider about:  The right schedule for you to have regular tests and exams.  Things you can do on your own to prevent diseases and keep yourself healthy.  What should I know about diet, weight, and exercise?  Eat a healthy diet    Eat a diet that includes plenty of vegetables, fruits, low-fat dairy products, and lean protein.  Do not eat a lot of foods that are high in solid fats, added sugars, or sodium.  Maintain a healthy weight  Body mass index (BMI) is a measurement that can be used to identify possible weight problems. It estimates body fat based on height and weight. Your health care provider can help determine your BMI and help you achieve or maintain a healthy weight.  Get regular exercise  Get regular exercise. This is one of the most important things you can do for your health. Most adults should:  Exercise for at least 150 minutes each week. The exercise should increase your heart rate and make you sweat (moderate-intensity exercise).  Do strengthening exercises at least twice a week. This is in addition to the moderate-intensity exercise.  Spend less time sitting. Even light physical activity can be beneficial.  Watch cholesterol and blood lipids  Have your blood tested for lipids and cholesterol at 47 years of age, then have this test every 5 years.  You may need to have your cholesterol levels checked more often if:  Your lipid or cholesterol levels are high.  You are older than 47 years of age.  You are at high risk for heart disease.  What should I know about cancer screening?  Many types of cancers can be detected early and may often be prevented. Depending on your health history and family history, you may need to have cancer screening at various ages. This may include screening for:  Colorectal cancer.  Prostate cancer.  Skin cancer.  Lung  cancer.  What should I know about heart disease, diabetes, and high blood pressure?  Blood pressure and heart disease  High blood pressure causes heart disease and increases the risk of stroke. This is more likely to develop in people who have high blood pressure readings or are overweight.  Talk with your health care provider about your target blood pressure readings.  Have your blood pressure checked:  Every 3-5 years if you are 24-52 years of age.  Every year if you are 3 years old or older.  If you are between the ages of 60 and 72 and are a current or former smoker, ask your health care provider if you should have a one-time screening for abdominal aortic aneurysm (AAA).  Diabetes  Have regular diabetes screenings. This checks your fasting blood sugar level. Have the screening done:  Once every three years after age 66 if you are at a normal weight and have a low risk for diabetes.  More often and at a younger age if you are overweight or have a high risk for diabetes.  What should I know about preventing infection?  Hepatitis B  If you have a higher risk for hepatitis B, you should be screened for this virus. Talk with your health care provider to find out if you are at risk for hepatitis B infection.  Hepatitis C  Blood testing is recommended for:  Everyone born from [birth year] through 1963.  Anyone  with known risk factors for hepatitis C.  Sexually transmitted infections (STIs)  You should be screened each year for STIs, including gonorrhea and chlamydia, if:  You are sexually active and are younger than 47 years of age.  You are older than 47 years of age and your health care provider tells you that you are at risk for this type of infection.  Your sexual activity has changed since you were last screened, and you are at increased risk for chlamydia or gonorrhea. Ask your health care provider if you are at risk.  Ask your health care provider about whether you are at high risk for HIV. Your health care provider  may recommend a prescription medicine to help prevent HIV infection. If you choose to take medicine to prevent HIV, you should first get tested for HIV. You should then be tested every 3 months for as long as you are taking the medicine.  Follow these instructions at home:  Alcohol use  Do not drink alcohol if your health care provider tells you not to drink.  If you drink alcohol:  Limit how much you have to 0-2 drinks a day.  Know how much alcohol is in your drink. In the [place] one drink equals one 12 oz bottle of beer (355 mL), one 5 oz glass of wine (148 mL), or one 1 oz glass of hard liquor (44 mL).  Lifestyle  Do not use any products that contain nicotine or tobacco. These products include cigarettes, chewing tobacco, and vaping devices, such as e-cigarettes. If you need help quitting, ask your health care provider.  Do not use street drugs.  Do not share needles.  Ask your health care provider for help if you need support or information about quitting drugs.  General instructions  Schedule regular health, dental, and eye exams.  Stay current with your vaccines.  Tell your health care provider if:  You often feel depressed.  You have ever been abused or do not feel safe at home.  Summary  Adopting a healthy lifestyle and getting preventive care are important in promoting health and wellness.  Follow your health care provider's instructions about healthy diet, exercising, and getting tested or screened for diseases.  Follow your health care provider's instructions on monitoring your cholesterol and blood pressure.  This information is not intended to replace advice given to you by your health care provider. Make sure you discuss any questions you have with your health care provider.  Document Revised: 10/06/2020 Document Reviewed: 10/06/2020  Elsevier Patient Education  2024 [employer].

## 2024-06-11 NOTE — Assessment & Plan Note (Signed)
 Diet and nutrition discussed Advised to decrease amount of daily carbohydrate intake and daily calories and increase amount of plant-based protein in his diet Patient did not have GI symptoms when he was taking GLP-1 agonist.  Wants to get back on it.  Recommend Zepbound  weekly. No history of diabetes

## 2024-06-11 NOTE — Assessment & Plan Note (Signed)
 Clinically stable.  No red flag signs or symptoms Benign abdominal examination Differential diagnosis discussed Recommend blood work today Recommend repeat stool sample profile Recommend GI evaluation.  Referral placed today. Diet and nutrition discussed Advised to stay well-hydrated

## 2024-06-11 NOTE — Assessment & Plan Note (Signed)
 Chronic. Stable. No concerns.  Lab Results  Component Value Date   WBC 7.3 06/06/2024   HGB 15.1 06/06/2024   HCT 45.4 06/06/2024   MCV 89.9 06/06/2024   PLT 121 (L) 06/06/2024

## 2024-06-12 ENCOUNTER — Ambulatory Visit: Payer: Self-pay | Admitting: Emergency Medicine

## 2024-06-12 NOTE — Telephone Encounter (Signed)
 Was seen in office 06/11/2024

## 2024-06-13 LAB — GI PROFILE, STOOL, PCR

## 2024-06-13 NOTE — Telephone Encounter (Signed)
 Patient was able to review results there were no questions or concerns

## 2024-06-20 ENCOUNTER — Other Ambulatory Visit (HOSPITAL_COMMUNITY): Payer: Self-pay

## 2024-06-21 ENCOUNTER — Other Ambulatory Visit (HOSPITAL_COMMUNITY): Payer: Self-pay

## 2024-06-21 ENCOUNTER — Telehealth: Payer: Self-pay

## 2024-06-21 NOTE — Telephone Encounter (Signed)
 Pharmacy Patient Advocate Encounter  Received notification from [facility] MEDICARE that Prior Authorization for Zepbound  2.5mg /0.89ml has been DENIED.  See denial reason below. No denial letter attached in CMM. Will attach denial letter to Media tab once received.   PA #/Case ID/Reference #: [id number]

## 2024-06-21 NOTE — Telephone Encounter (Signed)
 Pharmacy Patient Advocate Encounter   Received notification from CoverMyMeds that prior authorization for Zepbound  2.5mg /0.31ml is required/requested.   Insurance verification completed.   The patient is insured through [facility].   Per test claim: PA required; PA submitted to above mentioned insurance via Latent Key/confirmation #/EOC [id number] Status is pending

## 2024-07-03 NOTE — Telephone Encounter (Unsigned)
 Copied from CRM [id number]. Topic: General - Other >> Jul 03, 2024 12:55 PM Nessti S wrote: Reason for CRM: [employer] called in on behalf of pt about an appeal that was would have been submitted for the zepbound  denial. She would like a call back soon as possible

## 2024-07-06 NOTE — Telephone Encounter (Signed)
 I don't see a [employer] on his contact list and there was no number left on this for me to contact her back with to discuss about an appeal

## 2025-03-08 ENCOUNTER — Ambulatory Visit
